# Patient Record
Sex: Female | Born: 1950 | Race: White | Hispanic: No | Smoking: Never smoker
Health system: Southern US, Community
[De-identification: ages and names within clinical notes are randomized; demographics above are authoritative.]

## PROBLEM LIST (undated history)

## (undated) DIAGNOSIS — M419 Scoliosis, unspecified: Secondary | ICD-10-CM

## (undated) DIAGNOSIS — F329 Major depressive disorder, single episode, unspecified: Secondary | ICD-10-CM

## (undated) DIAGNOSIS — M8580 Other specified disorders of bone density and structure, unspecified site: Secondary | ICD-10-CM

## (undated) DIAGNOSIS — F32A Depression, unspecified: Secondary | ICD-10-CM

## (undated) DIAGNOSIS — I10 Essential (primary) hypertension: Secondary | ICD-10-CM

## (undated) DIAGNOSIS — G250 Essential tremor: Secondary | ICD-10-CM

## (undated) DIAGNOSIS — E785 Hyperlipidemia, unspecified: Secondary | ICD-10-CM

## (undated) DIAGNOSIS — N189 Chronic kidney disease, unspecified: Secondary | ICD-10-CM

## (undated) DIAGNOSIS — K589 Irritable bowel syndrome without diarrhea: Secondary | ICD-10-CM

## (undated) DIAGNOSIS — M1990 Unspecified osteoarthritis, unspecified site: Secondary | ICD-10-CM

## (undated) DIAGNOSIS — D649 Anemia, unspecified: Secondary | ICD-10-CM

## (undated) DIAGNOSIS — M069 Rheumatoid arthritis, unspecified: Secondary | ICD-10-CM

## (undated) DIAGNOSIS — M797 Fibromyalgia: Secondary | ICD-10-CM

## (undated) DIAGNOSIS — C8190 Hodgkin lymphoma, unspecified, unspecified site: Secondary | ICD-10-CM

## (undated) DIAGNOSIS — G629 Polyneuropathy, unspecified: Secondary | ICD-10-CM

## (undated) DIAGNOSIS — Z8719 Personal history of other diseases of the digestive system: Secondary | ICD-10-CM

## (undated) DIAGNOSIS — I639 Cerebral infarction, unspecified: Secondary | ICD-10-CM

## (undated) DIAGNOSIS — E119 Type 2 diabetes mellitus without complications: Secondary | ICD-10-CM

## (undated) DIAGNOSIS — F319 Bipolar disorder, unspecified: Secondary | ICD-10-CM

## (undated) DIAGNOSIS — M316 Other giant cell arteritis: Secondary | ICD-10-CM

## (undated) DIAGNOSIS — K219 Gastro-esophageal reflux disease without esophagitis: Secondary | ICD-10-CM

## (undated) DIAGNOSIS — F419 Anxiety disorder, unspecified: Secondary | ICD-10-CM

## (undated) DIAGNOSIS — R32 Unspecified urinary incontinence: Secondary | ICD-10-CM

## (undated) DIAGNOSIS — G4730 Sleep apnea, unspecified: Secondary | ICD-10-CM

## (undated) HISTORY — DX: Unspecified osteoarthritis, unspecified site: M19.90

## (undated) HISTORY — DX: Gastro-esophageal reflux disease without esophagitis: K21.9

## (undated) HISTORY — PX: ELBOW ARTHROSCOPY: SUR87

## (undated) HISTORY — DX: Fibromyalgia: M79.7

## (undated) HISTORY — DX: Rheumatoid arthritis, unspecified: M06.9

## (undated) HISTORY — DX: Anxiety disorder, unspecified: F41.9

## (undated) HISTORY — DX: Depression, unspecified: F32.A

## (undated) HISTORY — DX: Unspecified urinary incontinence: R32

## (undated) HISTORY — DX: Hyperlipidemia, unspecified: E78.5

## (undated) HISTORY — DX: Irritable bowel syndrome, unspecified: K58.9

## (undated) HISTORY — PX: HAND SURGERY: SHX662

## (undated) HISTORY — PX: BACK SURGERY: SHX140

## (undated) HISTORY — DX: Type 2 diabetes mellitus without complications: E11.9

## (undated) HISTORY — PX: FINGER SURGERY: SHX640

## (undated) HISTORY — PX: HERNIA REPAIR: SHX51

## (undated) HISTORY — PX: SPINE SURGERY: SHX786

---
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
## 1999-12-31 ENCOUNTER — Encounter: Payer: Self-pay | Admitting: Emergency Medicine

## 1999-12-31 ENCOUNTER — Emergency Department (HOSPITAL_COMMUNITY): Admission: EM | Admit: 1999-12-31 | Discharge: 1999-12-31 | Payer: Self-pay | Admitting: Emergency Medicine

## 2002-09-10 ENCOUNTER — Ambulatory Visit (HOSPITAL_COMMUNITY): Admission: RE | Admit: 2002-09-10 | Discharge: 2002-09-10 | Payer: Self-pay | Admitting: Internal Medicine

## 2004-03-16 ENCOUNTER — Ambulatory Visit (HOSPITAL_COMMUNITY): Admission: RE | Admit: 2004-03-16 | Discharge: 2004-03-16 | Payer: Self-pay | Admitting: Internal Medicine

## 2004-04-25 ENCOUNTER — Ambulatory Visit (HOSPITAL_COMMUNITY): Admission: RE | Admit: 2004-04-25 | Discharge: 2004-04-25 | Payer: Self-pay | Admitting: Gastroenterology

## 2004-07-29 DIAGNOSIS — F319 Bipolar disorder, unspecified: Secondary | ICD-10-CM

## 2004-07-29 HISTORY — DX: Bipolar disorder, unspecified: F31.9

## 2007-01-12 DIAGNOSIS — K449 Diaphragmatic hernia without obstruction or gangrene: Secondary | ICD-10-CM | POA: Insufficient documentation

## 2011-04-03 DIAGNOSIS — K219 Gastro-esophageal reflux disease without esophagitis: Secondary | ICD-10-CM | POA: Insufficient documentation

## 2012-07-29 DIAGNOSIS — C8190 Hodgkin lymphoma, unspecified, unspecified site: Secondary | ICD-10-CM

## 2012-07-29 DIAGNOSIS — C8110 Nodular sclerosis classical Hodgkin lymphoma, unspecified site: Secondary | ICD-10-CM | POA: Insufficient documentation

## 2012-07-29 HISTORY — DX: Hodgkin lymphoma, unspecified, unspecified site: C81.90

## 2014-09-19 DIAGNOSIS — K589 Irritable bowel syndrome without diarrhea: Secondary | ICD-10-CM | POA: Insufficient documentation

## 2016-03-21 DIAGNOSIS — M797 Fibromyalgia: Secondary | ICD-10-CM | POA: Insufficient documentation

## 2016-07-09 DIAGNOSIS — F419 Anxiety disorder, unspecified: Secondary | ICD-10-CM | POA: Diagnosis not present

## 2016-07-09 DIAGNOSIS — M797 Fibromyalgia: Secondary | ICD-10-CM | POA: Diagnosis not present

## 2016-07-09 DIAGNOSIS — Z8719 Personal history of other diseases of the digestive system: Secondary | ICD-10-CM | POA: Diagnosis not present

## 2016-07-09 DIAGNOSIS — G3184 Mild cognitive impairment, so stated: Secondary | ICD-10-CM | POA: Diagnosis not present

## 2016-07-09 DIAGNOSIS — R251 Tremor, unspecified: Secondary | ICD-10-CM | POA: Diagnosis not present

## 2016-07-09 DIAGNOSIS — I10 Essential (primary) hypertension: Secondary | ICD-10-CM | POA: Diagnosis not present

## 2016-07-09 DIAGNOSIS — R269 Unspecified abnormalities of gait and mobility: Secondary | ICD-10-CM | POA: Diagnosis not present

## 2016-07-09 DIAGNOSIS — Z85828 Personal history of other malignant neoplasm of skin: Secondary | ICD-10-CM | POA: Diagnosis not present

## 2016-07-09 DIAGNOSIS — E785 Hyperlipidemia, unspecified: Secondary | ICD-10-CM | POA: Diagnosis not present

## 2016-07-09 DIAGNOSIS — E1142 Type 2 diabetes mellitus with diabetic polyneuropathy: Secondary | ICD-10-CM | POA: Diagnosis not present

## 2016-07-09 DIAGNOSIS — E871 Hypo-osmolality and hyponatremia: Secondary | ICD-10-CM | POA: Diagnosis not present

## 2016-07-09 DIAGNOSIS — N3281 Overactive bladder: Secondary | ICD-10-CM | POA: Diagnosis not present

## 2016-07-09 DIAGNOSIS — Z794 Long term (current) use of insulin: Secondary | ICD-10-CM | POA: Diagnosis not present

## 2016-07-09 DIAGNOSIS — C8190 Hodgkin lymphoma, unspecified, unspecified site: Secondary | ICD-10-CM | POA: Diagnosis not present

## 2016-07-09 DIAGNOSIS — M48061 Spinal stenosis, lumbar region without neurogenic claudication: Secondary | ICD-10-CM | POA: Diagnosis not present

## 2016-07-09 DIAGNOSIS — Z7901 Long term (current) use of anticoagulants: Secondary | ICD-10-CM | POA: Diagnosis not present

## 2016-07-09 DIAGNOSIS — G4733 Obstructive sleep apnea (adult) (pediatric): Secondary | ICD-10-CM | POA: Diagnosis not present

## 2016-07-09 DIAGNOSIS — K589 Irritable bowel syndrome without diarrhea: Secondary | ICD-10-CM | POA: Diagnosis not present

## 2016-07-09 DIAGNOSIS — F410 Panic disorder [episodic paroxysmal anxiety] without agoraphobia: Secondary | ICD-10-CM | POA: Diagnosis not present

## 2016-07-09 DIAGNOSIS — F319 Bipolar disorder, unspecified: Secondary | ICD-10-CM | POA: Diagnosis not present

## 2016-07-09 DIAGNOSIS — R296 Repeated falls: Secondary | ICD-10-CM | POA: Diagnosis not present

## 2016-07-09 DIAGNOSIS — D649 Anemia, unspecified: Secondary | ICD-10-CM | POA: Diagnosis not present

## 2016-07-09 DIAGNOSIS — Z87440 Personal history of urinary (tract) infections: Secondary | ICD-10-CM | POA: Diagnosis not present

## 2016-07-09 DIAGNOSIS — Z7982 Long term (current) use of aspirin: Secondary | ICD-10-CM | POA: Diagnosis not present

## 2016-07-09 DIAGNOSIS — K219 Gastro-esophageal reflux disease without esophagitis: Secondary | ICD-10-CM | POA: Diagnosis not present

## 2016-07-11 DIAGNOSIS — I10 Essential (primary) hypertension: Secondary | ICD-10-CM | POA: Diagnosis not present

## 2016-07-11 DIAGNOSIS — C8190 Hodgkin lymphoma, unspecified, unspecified site: Secondary | ICD-10-CM | POA: Diagnosis not present

## 2016-07-11 DIAGNOSIS — E1142 Type 2 diabetes mellitus with diabetic polyneuropathy: Secondary | ICD-10-CM | POA: Diagnosis not present

## 2016-07-11 DIAGNOSIS — F319 Bipolar disorder, unspecified: Secondary | ICD-10-CM | POA: Diagnosis not present

## 2016-07-11 DIAGNOSIS — K589 Irritable bowel syndrome without diarrhea: Secondary | ICD-10-CM | POA: Diagnosis not present

## 2016-07-11 DIAGNOSIS — G3184 Mild cognitive impairment, so stated: Secondary | ICD-10-CM | POA: Diagnosis not present

## 2016-07-15 DIAGNOSIS — I10 Essential (primary) hypertension: Secondary | ICD-10-CM | POA: Diagnosis not present

## 2016-07-15 DIAGNOSIS — F319 Bipolar disorder, unspecified: Secondary | ICD-10-CM | POA: Diagnosis not present

## 2016-07-15 DIAGNOSIS — C8190 Hodgkin lymphoma, unspecified, unspecified site: Secondary | ICD-10-CM | POA: Diagnosis not present

## 2016-07-15 DIAGNOSIS — K589 Irritable bowel syndrome without diarrhea: Secondary | ICD-10-CM | POA: Diagnosis not present

## 2016-07-15 DIAGNOSIS — G3184 Mild cognitive impairment, so stated: Secondary | ICD-10-CM | POA: Diagnosis not present

## 2016-07-15 DIAGNOSIS — E1142 Type 2 diabetes mellitus with diabetic polyneuropathy: Secondary | ICD-10-CM | POA: Diagnosis not present

## 2016-07-16 DIAGNOSIS — K589 Irritable bowel syndrome without diarrhea: Secondary | ICD-10-CM | POA: Diagnosis not present

## 2016-07-16 DIAGNOSIS — E1142 Type 2 diabetes mellitus with diabetic polyneuropathy: Secondary | ICD-10-CM | POA: Diagnosis not present

## 2016-07-16 DIAGNOSIS — G3184 Mild cognitive impairment, so stated: Secondary | ICD-10-CM | POA: Diagnosis not present

## 2016-07-16 DIAGNOSIS — I10 Essential (primary) hypertension: Secondary | ICD-10-CM | POA: Diagnosis not present

## 2016-07-16 DIAGNOSIS — F319 Bipolar disorder, unspecified: Secondary | ICD-10-CM | POA: Diagnosis not present

## 2016-07-16 DIAGNOSIS — C8190 Hodgkin lymphoma, unspecified, unspecified site: Secondary | ICD-10-CM | POA: Diagnosis not present

## 2016-07-17 DIAGNOSIS — E1142 Type 2 diabetes mellitus with diabetic polyneuropathy: Secondary | ICD-10-CM | POA: Diagnosis not present

## 2016-07-17 DIAGNOSIS — C8190 Hodgkin lymphoma, unspecified, unspecified site: Secondary | ICD-10-CM | POA: Diagnosis not present

## 2016-07-17 DIAGNOSIS — F319 Bipolar disorder, unspecified: Secondary | ICD-10-CM | POA: Diagnosis not present

## 2016-07-17 DIAGNOSIS — G3184 Mild cognitive impairment, so stated: Secondary | ICD-10-CM | POA: Diagnosis not present

## 2016-07-17 DIAGNOSIS — I10 Essential (primary) hypertension: Secondary | ICD-10-CM | POA: Diagnosis not present

## 2016-07-17 DIAGNOSIS — K589 Irritable bowel syndrome without diarrhea: Secondary | ICD-10-CM | POA: Diagnosis not present

## 2016-07-19 DIAGNOSIS — K589 Irritable bowel syndrome without diarrhea: Secondary | ICD-10-CM | POA: Diagnosis not present

## 2016-07-19 DIAGNOSIS — C8190 Hodgkin lymphoma, unspecified, unspecified site: Secondary | ICD-10-CM | POA: Diagnosis not present

## 2016-07-19 DIAGNOSIS — F319 Bipolar disorder, unspecified: Secondary | ICD-10-CM | POA: Diagnosis not present

## 2016-07-19 DIAGNOSIS — I10 Essential (primary) hypertension: Secondary | ICD-10-CM | POA: Diagnosis not present

## 2016-07-19 DIAGNOSIS — E1142 Type 2 diabetes mellitus with diabetic polyneuropathy: Secondary | ICD-10-CM | POA: Diagnosis not present

## 2016-07-19 DIAGNOSIS — G3184 Mild cognitive impairment, so stated: Secondary | ICD-10-CM | POA: Diagnosis not present

## 2016-07-24 DIAGNOSIS — I10 Essential (primary) hypertension: Secondary | ICD-10-CM | POA: Diagnosis not present

## 2016-07-24 DIAGNOSIS — E1142 Type 2 diabetes mellitus with diabetic polyneuropathy: Secondary | ICD-10-CM | POA: Diagnosis not present

## 2016-07-24 DIAGNOSIS — G3184 Mild cognitive impairment, so stated: Secondary | ICD-10-CM | POA: Diagnosis not present

## 2016-07-24 DIAGNOSIS — C8190 Hodgkin lymphoma, unspecified, unspecified site: Secondary | ICD-10-CM | POA: Diagnosis not present

## 2016-07-24 DIAGNOSIS — K589 Irritable bowel syndrome without diarrhea: Secondary | ICD-10-CM | POA: Diagnosis not present

## 2016-07-24 DIAGNOSIS — F319 Bipolar disorder, unspecified: Secondary | ICD-10-CM | POA: Diagnosis not present

## 2016-07-25 DIAGNOSIS — K589 Irritable bowel syndrome without diarrhea: Secondary | ICD-10-CM | POA: Diagnosis not present

## 2016-07-25 DIAGNOSIS — E1142 Type 2 diabetes mellitus with diabetic polyneuropathy: Secondary | ICD-10-CM | POA: Diagnosis not present

## 2016-07-25 DIAGNOSIS — C8190 Hodgkin lymphoma, unspecified, unspecified site: Secondary | ICD-10-CM | POA: Diagnosis not present

## 2016-07-25 DIAGNOSIS — I10 Essential (primary) hypertension: Secondary | ICD-10-CM | POA: Diagnosis not present

## 2016-07-25 DIAGNOSIS — F319 Bipolar disorder, unspecified: Secondary | ICD-10-CM | POA: Diagnosis not present

## 2016-07-25 DIAGNOSIS — G3184 Mild cognitive impairment, so stated: Secondary | ICD-10-CM | POA: Diagnosis not present

## 2016-07-31 DIAGNOSIS — F319 Bipolar disorder, unspecified: Secondary | ICD-10-CM | POA: Diagnosis not present

## 2016-07-31 DIAGNOSIS — K589 Irritable bowel syndrome without diarrhea: Secondary | ICD-10-CM | POA: Diagnosis not present

## 2016-07-31 DIAGNOSIS — G3184 Mild cognitive impairment, so stated: Secondary | ICD-10-CM | POA: Diagnosis not present

## 2016-07-31 DIAGNOSIS — I10 Essential (primary) hypertension: Secondary | ICD-10-CM | POA: Diagnosis not present

## 2016-07-31 DIAGNOSIS — E1142 Type 2 diabetes mellitus with diabetic polyneuropathy: Secondary | ICD-10-CM | POA: Diagnosis not present

## 2016-07-31 DIAGNOSIS — C8190 Hodgkin lymphoma, unspecified, unspecified site: Secondary | ICD-10-CM | POA: Diagnosis not present

## 2016-08-01 DIAGNOSIS — E1142 Type 2 diabetes mellitus with diabetic polyneuropathy: Secondary | ICD-10-CM | POA: Diagnosis not present

## 2016-08-01 DIAGNOSIS — C8190 Hodgkin lymphoma, unspecified, unspecified site: Secondary | ICD-10-CM | POA: Diagnosis not present

## 2016-08-01 DIAGNOSIS — F319 Bipolar disorder, unspecified: Secondary | ICD-10-CM | POA: Diagnosis not present

## 2016-08-01 DIAGNOSIS — I10 Essential (primary) hypertension: Secondary | ICD-10-CM | POA: Diagnosis not present

## 2016-08-01 DIAGNOSIS — G3184 Mild cognitive impairment, so stated: Secondary | ICD-10-CM | POA: Diagnosis not present

## 2016-08-01 DIAGNOSIS — K589 Irritable bowel syndrome without diarrhea: Secondary | ICD-10-CM | POA: Diagnosis not present

## 2016-08-05 DIAGNOSIS — G3184 Mild cognitive impairment, so stated: Secondary | ICD-10-CM | POA: Diagnosis not present

## 2016-08-05 DIAGNOSIS — C8190 Hodgkin lymphoma, unspecified, unspecified site: Secondary | ICD-10-CM | POA: Diagnosis not present

## 2016-08-05 DIAGNOSIS — I10 Essential (primary) hypertension: Secondary | ICD-10-CM | POA: Diagnosis not present

## 2016-08-05 DIAGNOSIS — E1142 Type 2 diabetes mellitus with diabetic polyneuropathy: Secondary | ICD-10-CM | POA: Diagnosis not present

## 2016-08-05 DIAGNOSIS — K589 Irritable bowel syndrome without diarrhea: Secondary | ICD-10-CM | POA: Diagnosis not present

## 2016-08-05 DIAGNOSIS — F319 Bipolar disorder, unspecified: Secondary | ICD-10-CM | POA: Diagnosis not present

## 2016-08-06 DIAGNOSIS — C8190 Hodgkin lymphoma, unspecified, unspecified site: Secondary | ICD-10-CM | POA: Diagnosis not present

## 2016-08-06 DIAGNOSIS — I10 Essential (primary) hypertension: Secondary | ICD-10-CM | POA: Diagnosis not present

## 2016-08-06 DIAGNOSIS — J019 Acute sinusitis, unspecified: Secondary | ICD-10-CM | POA: Diagnosis not present

## 2016-08-06 DIAGNOSIS — G894 Chronic pain syndrome: Secondary | ICD-10-CM | POA: Diagnosis not present

## 2016-08-07 DIAGNOSIS — D72819 Decreased white blood cell count, unspecified: Secondary | ICD-10-CM | POA: Diagnosis not present

## 2016-08-07 DIAGNOSIS — C8118 Nodular sclerosis classical Hodgkin lymphoma, lymph nodes of multiple sites: Secondary | ICD-10-CM | POA: Diagnosis not present

## 2016-08-07 DIAGNOSIS — D649 Anemia, unspecified: Secondary | ICD-10-CM | POA: Diagnosis not present

## 2016-08-19 DIAGNOSIS — I10 Essential (primary) hypertension: Secondary | ICD-10-CM | POA: Diagnosis not present

## 2016-08-19 DIAGNOSIS — E871 Hypo-osmolality and hyponatremia: Secondary | ICD-10-CM | POA: Diagnosis not present

## 2016-08-19 DIAGNOSIS — N329 Bladder disorder, unspecified: Secondary | ICD-10-CM | POA: Diagnosis not present

## 2016-08-19 DIAGNOSIS — N182 Chronic kidney disease, stage 2 (mild): Secondary | ICD-10-CM | POA: Diagnosis not present

## 2016-08-23 DIAGNOSIS — D509 Iron deficiency anemia, unspecified: Secondary | ICD-10-CM | POA: Diagnosis not present

## 2016-08-23 DIAGNOSIS — D510 Vitamin B12 deficiency anemia due to intrinsic factor deficiency: Secondary | ICD-10-CM | POA: Diagnosis not present

## 2016-08-29 DIAGNOSIS — L600 Ingrowing nail: Secondary | ICD-10-CM | POA: Diagnosis not present

## 2016-08-29 DIAGNOSIS — L03031 Cellulitis of right toe: Secondary | ICD-10-CM | POA: Diagnosis not present

## 2016-09-05 DIAGNOSIS — M13841 Other specified arthritis, right hand: Secondary | ICD-10-CM | POA: Diagnosis not present

## 2016-09-05 DIAGNOSIS — E1169 Type 2 diabetes mellitus with other specified complication: Secondary | ICD-10-CM | POA: Diagnosis not present

## 2016-09-05 DIAGNOSIS — G894 Chronic pain syndrome: Secondary | ICD-10-CM | POA: Diagnosis not present

## 2016-09-05 DIAGNOSIS — M545 Low back pain: Secondary | ICD-10-CM | POA: Diagnosis not present

## 2016-09-05 DIAGNOSIS — M797 Fibromyalgia: Secondary | ICD-10-CM | POA: Diagnosis not present

## 2016-09-05 DIAGNOSIS — I10 Essential (primary) hypertension: Secondary | ICD-10-CM | POA: Diagnosis not present

## 2016-09-10 DIAGNOSIS — F321 Major depressive disorder, single episode, moderate: Secondary | ICD-10-CM | POA: Diagnosis not present

## 2016-09-10 DIAGNOSIS — F410 Panic disorder [episodic paroxysmal anxiety] without agoraphobia: Secondary | ICD-10-CM | POA: Diagnosis not present

## 2016-09-16 DIAGNOSIS — R413 Other amnesia: Secondary | ICD-10-CM | POA: Diagnosis not present

## 2016-09-16 DIAGNOSIS — G9341 Metabolic encephalopathy: Secondary | ICD-10-CM | POA: Diagnosis not present

## 2016-09-19 DIAGNOSIS — R590 Localized enlarged lymph nodes: Secondary | ICD-10-CM | POA: Diagnosis not present

## 2016-09-19 DIAGNOSIS — Z789 Other specified health status: Secondary | ICD-10-CM | POA: Diagnosis not present

## 2016-09-19 DIAGNOSIS — J45909 Unspecified asthma, uncomplicated: Secondary | ICD-10-CM | POA: Diagnosis not present

## 2016-09-19 DIAGNOSIS — G4730 Sleep apnea, unspecified: Secondary | ICD-10-CM | POA: Diagnosis not present

## 2016-09-20 DIAGNOSIS — S62306A Unspecified fracture of fifth metacarpal bone, right hand, initial encounter for closed fracture: Secondary | ICD-10-CM | POA: Diagnosis not present

## 2016-09-20 DIAGNOSIS — M542 Cervicalgia: Secondary | ICD-10-CM | POA: Diagnosis not present

## 2016-09-20 DIAGNOSIS — R51 Headache: Secondary | ICD-10-CM | POA: Diagnosis not present

## 2016-09-20 DIAGNOSIS — S62609A Fracture of unspecified phalanx of unspecified finger, initial encounter for closed fracture: Secondary | ICD-10-CM | POA: Diagnosis not present

## 2016-09-20 DIAGNOSIS — S62304A Unspecified fracture of fourth metacarpal bone, right hand, initial encounter for closed fracture: Secondary | ICD-10-CM | POA: Diagnosis not present

## 2016-09-20 DIAGNOSIS — S90416A Abrasion, unspecified lesser toe(s), initial encounter: Secondary | ICD-10-CM | POA: Diagnosis not present

## 2016-09-20 DIAGNOSIS — S6291XA Unspecified fracture of right wrist and hand, initial encounter for closed fracture: Secondary | ICD-10-CM | POA: Diagnosis not present

## 2016-09-20 DIAGNOSIS — S0083XA Contusion of other part of head, initial encounter: Secondary | ICD-10-CM | POA: Diagnosis not present

## 2016-09-20 DIAGNOSIS — S8992XA Unspecified injury of left lower leg, initial encounter: Secondary | ICD-10-CM | POA: Diagnosis not present

## 2016-09-20 DIAGNOSIS — S62616A Displaced fracture of proximal phalanx of right little finger, initial encounter for closed fracture: Secondary | ICD-10-CM | POA: Diagnosis not present

## 2016-09-20 DIAGNOSIS — S0990XA Unspecified injury of head, initial encounter: Secondary | ICD-10-CM | POA: Diagnosis not present

## 2016-09-20 DIAGNOSIS — S8001XA Contusion of right knee, initial encounter: Secondary | ICD-10-CM | POA: Diagnosis not present

## 2016-09-20 DIAGNOSIS — S8991XA Unspecified injury of right lower leg, initial encounter: Secondary | ICD-10-CM | POA: Diagnosis not present

## 2016-09-20 DIAGNOSIS — S098XXA Other specified injuries of head, initial encounter: Secondary | ICD-10-CM | POA: Diagnosis not present

## 2016-09-20 DIAGNOSIS — S8002XA Contusion of left knee, initial encounter: Secondary | ICD-10-CM | POA: Diagnosis not present

## 2016-09-20 DIAGNOSIS — S0993XA Unspecified injury of face, initial encounter: Secondary | ICD-10-CM | POA: Diagnosis not present

## 2016-09-20 DIAGNOSIS — S199XXA Unspecified injury of neck, initial encounter: Secondary | ICD-10-CM | POA: Diagnosis not present

## 2016-09-21 DIAGNOSIS — M25541 Pain in joints of right hand: Secondary | ICD-10-CM | POA: Diagnosis not present

## 2016-09-21 DIAGNOSIS — S62394A Other fracture of fourth metacarpal bone, right hand, initial encounter for closed fracture: Secondary | ICD-10-CM | POA: Diagnosis not present

## 2016-09-21 DIAGNOSIS — S62396A Other fracture of fifth metacarpal bone, right hand, initial encounter for closed fracture: Secondary | ICD-10-CM | POA: Diagnosis not present

## 2016-09-24 DIAGNOSIS — I10 Essential (primary) hypertension: Secondary | ICD-10-CM | POA: Diagnosis not present

## 2016-09-24 DIAGNOSIS — N390 Urinary tract infection, site not specified: Secondary | ICD-10-CM | POA: Diagnosis not present

## 2016-09-24 DIAGNOSIS — E538 Deficiency of other specified B group vitamins: Secondary | ICD-10-CM | POA: Diagnosis not present

## 2016-09-24 DIAGNOSIS — B9689 Other specified bacterial agents as the cause of diseases classified elsewhere: Secondary | ICD-10-CM | POA: Diagnosis not present

## 2016-09-24 DIAGNOSIS — M797 Fibromyalgia: Secondary | ICD-10-CM | POA: Diagnosis not present

## 2016-10-16 DIAGNOSIS — M65321 Trigger finger, right index finger: Secondary | ICD-10-CM | POA: Diagnosis not present

## 2016-10-17 DIAGNOSIS — F321 Major depressive disorder, single episode, moderate: Secondary | ICD-10-CM | POA: Diagnosis not present

## 2016-10-17 DIAGNOSIS — F319 Bipolar disorder, unspecified: Secondary | ICD-10-CM | POA: Diagnosis not present

## 2016-10-21 DIAGNOSIS — N182 Chronic kidney disease, stage 2 (mild): Secondary | ICD-10-CM | POA: Diagnosis not present

## 2016-10-21 DIAGNOSIS — I10 Essential (primary) hypertension: Secondary | ICD-10-CM | POA: Diagnosis not present

## 2016-10-21 DIAGNOSIS — N329 Bladder disorder, unspecified: Secondary | ICD-10-CM | POA: Diagnosis not present

## 2016-10-21 DIAGNOSIS — E871 Hypo-osmolality and hyponatremia: Secondary | ICD-10-CM | POA: Diagnosis not present

## 2016-10-23 DIAGNOSIS — N3281 Overactive bladder: Secondary | ICD-10-CM | POA: Diagnosis not present

## 2016-10-23 DIAGNOSIS — E1169 Type 2 diabetes mellitus with other specified complication: Secondary | ICD-10-CM | POA: Diagnosis not present

## 2016-10-23 DIAGNOSIS — G894 Chronic pain syndrome: Secondary | ICD-10-CM | POA: Diagnosis not present

## 2016-10-23 DIAGNOSIS — R5383 Other fatigue: Secondary | ICD-10-CM | POA: Diagnosis not present

## 2016-10-23 DIAGNOSIS — M797 Fibromyalgia: Secondary | ICD-10-CM | POA: Diagnosis not present

## 2016-10-23 DIAGNOSIS — E871 Hypo-osmolality and hyponatremia: Secondary | ICD-10-CM | POA: Diagnosis not present

## 2016-11-03 DIAGNOSIS — J040 Acute laryngitis: Secondary | ICD-10-CM | POA: Diagnosis not present

## 2016-11-03 DIAGNOSIS — J069 Acute upper respiratory infection, unspecified: Secondary | ICD-10-CM | POA: Diagnosis not present

## 2016-11-04 DIAGNOSIS — E538 Deficiency of other specified B group vitamins: Secondary | ICD-10-CM | POA: Diagnosis not present

## 2016-11-04 DIAGNOSIS — J208 Acute bronchitis due to other specified organisms: Secondary | ICD-10-CM | POA: Diagnosis not present

## 2016-11-04 DIAGNOSIS — G894 Chronic pain syndrome: Secondary | ICD-10-CM | POA: Diagnosis not present

## 2016-11-04 DIAGNOSIS — I10 Essential (primary) hypertension: Secondary | ICD-10-CM | POA: Diagnosis not present

## 2016-11-04 DIAGNOSIS — K219 Gastro-esophageal reflux disease without esophagitis: Secondary | ICD-10-CM | POA: Diagnosis not present

## 2016-11-07 DIAGNOSIS — Z87440 Personal history of urinary (tract) infections: Secondary | ICD-10-CM | POA: Diagnosis not present

## 2016-11-07 DIAGNOSIS — E784 Other hyperlipidemia: Secondary | ICD-10-CM | POA: Diagnosis not present

## 2016-11-07 DIAGNOSIS — M797 Fibromyalgia: Secondary | ICD-10-CM | POA: Diagnosis not present

## 2016-11-07 DIAGNOSIS — F419 Anxiety disorder, unspecified: Secondary | ICD-10-CM | POA: Diagnosis not present

## 2016-11-07 DIAGNOSIS — F319 Bipolar disorder, unspecified: Secondary | ICD-10-CM | POA: Diagnosis not present

## 2016-11-07 DIAGNOSIS — R1110 Vomiting, unspecified: Secondary | ICD-10-CM | POA: Diagnosis not present

## 2016-11-07 DIAGNOSIS — E1165 Type 2 diabetes mellitus with hyperglycemia: Secondary | ICD-10-CM | POA: Diagnosis present

## 2016-11-07 DIAGNOSIS — R809 Proteinuria, unspecified: Secondary | ICD-10-CM | POA: Diagnosis not present

## 2016-11-07 DIAGNOSIS — Z9181 History of falling: Secondary | ICD-10-CM | POA: Diagnosis not present

## 2016-11-07 DIAGNOSIS — Z8571 Personal history of Hodgkin lymphoma: Secondary | ICD-10-CM | POA: Diagnosis not present

## 2016-11-07 DIAGNOSIS — R112 Nausea with vomiting, unspecified: Secondary | ICD-10-CM | POA: Diagnosis present

## 2016-11-07 DIAGNOSIS — G9009 Other idiopathic peripheral autonomic neuropathy: Secondary | ICD-10-CM | POA: Diagnosis not present

## 2016-11-07 DIAGNOSIS — R296 Repeated falls: Secondary | ICD-10-CM | POA: Diagnosis present

## 2016-11-07 DIAGNOSIS — R110 Nausea: Secondary | ICD-10-CM | POA: Diagnosis not present

## 2016-11-07 DIAGNOSIS — Z8674 Personal history of sudden cardiac arrest: Secondary | ICD-10-CM | POA: Diagnosis not present

## 2016-11-07 DIAGNOSIS — E860 Dehydration: Secondary | ICD-10-CM | POA: Diagnosis not present

## 2016-11-07 DIAGNOSIS — E785 Hyperlipidemia, unspecified: Secondary | ICD-10-CM | POA: Diagnosis present

## 2016-11-07 DIAGNOSIS — N319 Neuromuscular dysfunction of bladder, unspecified: Secondary | ICD-10-CM | POA: Diagnosis not present

## 2016-11-07 DIAGNOSIS — J449 Chronic obstructive pulmonary disease, unspecified: Secondary | ICD-10-CM | POA: Diagnosis not present

## 2016-11-07 DIAGNOSIS — E1169 Type 2 diabetes mellitus with other specified complication: Secondary | ICD-10-CM | POA: Diagnosis not present

## 2016-11-07 DIAGNOSIS — Z794 Long term (current) use of insulin: Secondary | ICD-10-CM | POA: Diagnosis not present

## 2016-11-07 DIAGNOSIS — E1142 Type 2 diabetes mellitus with diabetic polyneuropathy: Secondary | ICD-10-CM | POA: Diagnosis present

## 2016-11-07 DIAGNOSIS — N3289 Other specified disorders of bladder: Secondary | ICD-10-CM | POA: Diagnosis not present

## 2016-11-07 DIAGNOSIS — R197 Diarrhea, unspecified: Secondary | ICD-10-CM | POA: Diagnosis not present

## 2016-11-07 DIAGNOSIS — E119 Type 2 diabetes mellitus without complications: Secondary | ICD-10-CM | POA: Diagnosis not present

## 2016-11-07 DIAGNOSIS — A419 Sepsis, unspecified organism: Secondary | ICD-10-CM | POA: Diagnosis not present

## 2016-11-07 DIAGNOSIS — R4182 Altered mental status, unspecified: Secondary | ICD-10-CM | POA: Diagnosis not present

## 2016-11-07 DIAGNOSIS — I10 Essential (primary) hypertension: Secondary | ICD-10-CM | POA: Diagnosis not present

## 2016-11-07 DIAGNOSIS — N390 Urinary tract infection, site not specified: Secondary | ICD-10-CM | POA: Diagnosis not present

## 2016-11-07 DIAGNOSIS — F4310 Post-traumatic stress disorder, unspecified: Secondary | ICD-10-CM | POA: Diagnosis not present

## 2016-11-07 DIAGNOSIS — C8118 Nodular sclerosis classical Hodgkin lymphoma, lymph nodes of multiple sites: Secondary | ICD-10-CM | POA: Diagnosis not present

## 2016-11-14 DIAGNOSIS — F4310 Post-traumatic stress disorder, unspecified: Secondary | ICD-10-CM | POA: Diagnosis not present

## 2016-11-14 DIAGNOSIS — F419 Anxiety disorder, unspecified: Secondary | ICD-10-CM | POA: Diagnosis not present

## 2016-11-14 DIAGNOSIS — F319 Bipolar disorder, unspecified: Secondary | ICD-10-CM | POA: Diagnosis not present

## 2016-11-18 DIAGNOSIS — S62396D Other fracture of fifth metacarpal bone, right hand, subsequent encounter for fracture with routine healing: Secondary | ICD-10-CM | POA: Diagnosis not present

## 2016-11-18 DIAGNOSIS — S62394D Other fracture of fourth metacarpal bone, right hand, subsequent encounter for fracture with routine healing: Secondary | ICD-10-CM | POA: Diagnosis not present

## 2016-11-20 DIAGNOSIS — G894 Chronic pain syndrome: Secondary | ICD-10-CM | POA: Diagnosis not present

## 2016-11-20 DIAGNOSIS — E1169 Type 2 diabetes mellitus with other specified complication: Secondary | ICD-10-CM | POA: Diagnosis not present

## 2016-11-20 DIAGNOSIS — K219 Gastro-esophageal reflux disease without esophagitis: Secondary | ICD-10-CM | POA: Diagnosis not present

## 2016-11-20 DIAGNOSIS — M799 Soft tissue disorder, unspecified: Secondary | ICD-10-CM | POA: Diagnosis not present

## 2016-11-20 DIAGNOSIS — R159 Full incontinence of feces: Secondary | ICD-10-CM | POA: Diagnosis not present

## 2016-11-21 DIAGNOSIS — F321 Major depressive disorder, single episode, moderate: Secondary | ICD-10-CM | POA: Diagnosis not present

## 2016-11-25 DIAGNOSIS — E871 Hypo-osmolality and hyponatremia: Secondary | ICD-10-CM | POA: Diagnosis not present

## 2016-11-25 DIAGNOSIS — I10 Essential (primary) hypertension: Secondary | ICD-10-CM | POA: Diagnosis not present

## 2016-11-25 DIAGNOSIS — N329 Bladder disorder, unspecified: Secondary | ICD-10-CM | POA: Diagnosis not present

## 2016-11-25 DIAGNOSIS — N182 Chronic kidney disease, stage 2 (mild): Secondary | ICD-10-CM | POA: Diagnosis not present

## 2016-12-10 DIAGNOSIS — R159 Full incontinence of feces: Secondary | ICD-10-CM | POA: Diagnosis not present

## 2016-12-10 DIAGNOSIS — E119 Type 2 diabetes mellitus without complications: Secondary | ICD-10-CM | POA: Diagnosis not present

## 2016-12-10 DIAGNOSIS — C8590 Non-Hodgkin lymphoma, unspecified, unspecified site: Secondary | ICD-10-CM | POA: Diagnosis not present

## 2016-12-10 DIAGNOSIS — E669 Obesity, unspecified: Secondary | ICD-10-CM | POA: Diagnosis not present

## 2016-12-14 DIAGNOSIS — R1110 Vomiting, unspecified: Secondary | ICD-10-CM | POA: Diagnosis not present

## 2016-12-14 DIAGNOSIS — R4182 Altered mental status, unspecified: Secondary | ICD-10-CM | POA: Diagnosis not present

## 2016-12-14 DIAGNOSIS — M797 Fibromyalgia: Secondary | ICD-10-CM | POA: Diagnosis not present

## 2016-12-14 DIAGNOSIS — C8118 Nodular sclerosis classical Hodgkin lymphoma, lymph nodes of multiple sites: Secondary | ICD-10-CM | POA: Diagnosis not present

## 2016-12-14 DIAGNOSIS — E860 Dehydration: Secondary | ICD-10-CM | POA: Diagnosis not present

## 2016-12-14 DIAGNOSIS — E1169 Type 2 diabetes mellitus with other specified complication: Secondary | ICD-10-CM | POA: Diagnosis not present

## 2016-12-14 DIAGNOSIS — J449 Chronic obstructive pulmonary disease, unspecified: Secondary | ICD-10-CM | POA: Diagnosis not present

## 2016-12-14 DIAGNOSIS — I10 Essential (primary) hypertension: Secondary | ICD-10-CM | POA: Diagnosis not present

## 2016-12-14 DIAGNOSIS — N3289 Other specified disorders of bladder: Secondary | ICD-10-CM | POA: Diagnosis not present

## 2016-12-14 DIAGNOSIS — R110 Nausea: Secondary | ICD-10-CM | POA: Diagnosis not present

## 2016-12-14 DIAGNOSIS — Z9181 History of falling: Secondary | ICD-10-CM | POA: Diagnosis not present

## 2016-12-14 DIAGNOSIS — E784 Other hyperlipidemia: Secondary | ICD-10-CM | POA: Diagnosis not present

## 2016-12-18 DIAGNOSIS — M65321 Trigger finger, right index finger: Secondary | ICD-10-CM | POA: Diagnosis not present

## 2016-12-18 DIAGNOSIS — S62394D Other fracture of fourth metacarpal bone, right hand, subsequent encounter for fracture with routine healing: Secondary | ICD-10-CM | POA: Diagnosis not present

## 2016-12-19 DIAGNOSIS — F319 Bipolar disorder, unspecified: Secondary | ICD-10-CM | POA: Diagnosis not present

## 2016-12-19 DIAGNOSIS — F419 Anxiety disorder, unspecified: Secondary | ICD-10-CM | POA: Diagnosis not present

## 2016-12-19 DIAGNOSIS — F4310 Post-traumatic stress disorder, unspecified: Secondary | ICD-10-CM | POA: Diagnosis not present

## 2016-12-20 DIAGNOSIS — M797 Fibromyalgia: Secondary | ICD-10-CM | POA: Diagnosis not present

## 2016-12-20 DIAGNOSIS — C8190 Hodgkin lymphoma, unspecified, unspecified site: Secondary | ICD-10-CM | POA: Diagnosis not present

## 2016-12-20 DIAGNOSIS — L03115 Cellulitis of right lower limb: Secondary | ICD-10-CM | POA: Diagnosis not present

## 2016-12-20 DIAGNOSIS — G894 Chronic pain syndrome: Secondary | ICD-10-CM | POA: Diagnosis not present

## 2016-12-20 DIAGNOSIS — I10 Essential (primary) hypertension: Secondary | ICD-10-CM | POA: Diagnosis not present

## 2016-12-20 DIAGNOSIS — E538 Deficiency of other specified B group vitamins: Secondary | ICD-10-CM | POA: Diagnosis not present

## 2016-12-24 DIAGNOSIS — F321 Major depressive disorder, single episode, moderate: Secondary | ICD-10-CM | POA: Diagnosis not present

## 2016-12-27 DIAGNOSIS — G4733 Obstructive sleep apnea (adult) (pediatric): Secondary | ICD-10-CM | POA: Diagnosis not present

## 2016-12-27 DIAGNOSIS — E1169 Type 2 diabetes mellitus with other specified complication: Secondary | ICD-10-CM | POA: Diagnosis not present

## 2016-12-27 DIAGNOSIS — M797 Fibromyalgia: Secondary | ICD-10-CM | POA: Diagnosis not present

## 2016-12-27 DIAGNOSIS — E1142 Type 2 diabetes mellitus with diabetic polyneuropathy: Secondary | ICD-10-CM | POA: Diagnosis not present

## 2016-12-27 DIAGNOSIS — M545 Low back pain: Secondary | ICD-10-CM | POA: Diagnosis not present

## 2016-12-27 DIAGNOSIS — F319 Bipolar disorder, unspecified: Secondary | ICD-10-CM | POA: Diagnosis present

## 2016-12-27 DIAGNOSIS — Z9989 Dependence on other enabling machines and devices: Secondary | ICD-10-CM | POA: Diagnosis not present

## 2016-12-27 DIAGNOSIS — R42 Dizziness and giddiness: Secondary | ICD-10-CM | POA: Diagnosis not present

## 2016-12-27 DIAGNOSIS — Z9119 Patient's noncompliance with other medical treatment and regimen: Secondary | ICD-10-CM | POA: Diagnosis not present

## 2016-12-27 DIAGNOSIS — J45909 Unspecified asthma, uncomplicated: Secondary | ICD-10-CM | POA: Diagnosis not present

## 2016-12-27 DIAGNOSIS — K219 Gastro-esophageal reflux disease without esophagitis: Secondary | ICD-10-CM | POA: Diagnosis not present

## 2016-12-27 DIAGNOSIS — E559 Vitamin D deficiency, unspecified: Secondary | ICD-10-CM | POA: Diagnosis present

## 2016-12-27 DIAGNOSIS — E785 Hyperlipidemia, unspecified: Secondary | ICD-10-CM | POA: Diagnosis not present

## 2016-12-27 DIAGNOSIS — I639 Cerebral infarction, unspecified: Secondary | ICD-10-CM | POA: Diagnosis not present

## 2016-12-27 DIAGNOSIS — I348 Other nonrheumatic mitral valve disorders: Secondary | ICD-10-CM | POA: Diagnosis not present

## 2016-12-27 DIAGNOSIS — D649 Anemia, unspecified: Secondary | ICD-10-CM | POA: Diagnosis present

## 2016-12-27 DIAGNOSIS — K149 Disease of tongue, unspecified: Secondary | ICD-10-CM | POA: Diagnosis not present

## 2016-12-27 DIAGNOSIS — I517 Cardiomegaly: Secondary | ICD-10-CM | POA: Diagnosis not present

## 2016-12-27 DIAGNOSIS — Z9889 Other specified postprocedural states: Secondary | ICD-10-CM | POA: Diagnosis not present

## 2016-12-27 DIAGNOSIS — I63531 Cerebral infarction due to unspecified occlusion or stenosis of right posterior cerebral artery: Secondary | ICD-10-CM | POA: Diagnosis not present

## 2016-12-27 DIAGNOSIS — R809 Proteinuria, unspecified: Secondary | ICD-10-CM | POA: Diagnosis not present

## 2016-12-27 DIAGNOSIS — R51 Headache: Secondary | ICD-10-CM | POA: Diagnosis not present

## 2016-12-27 DIAGNOSIS — D631 Anemia in chronic kidney disease: Secondary | ICD-10-CM | POA: Diagnosis not present

## 2016-12-27 DIAGNOSIS — R29810 Facial weakness: Secondary | ICD-10-CM | POA: Diagnosis not present

## 2016-12-27 DIAGNOSIS — E784 Other hyperlipidemia: Secondary | ICD-10-CM | POA: Diagnosis not present

## 2016-12-27 DIAGNOSIS — Z9282 Status post administration of tPA (rtPA) in a different facility within the last 24 hours prior to admission to current facility: Secondary | ICD-10-CM | POA: Diagnosis not present

## 2016-12-27 DIAGNOSIS — D6489 Other specified anemias: Secondary | ICD-10-CM | POA: Diagnosis not present

## 2016-12-27 DIAGNOSIS — R897 Abnormal histological findings in specimens from other organs, systems and tissues: Secondary | ICD-10-CM | POA: Diagnosis not present

## 2016-12-27 DIAGNOSIS — C8190 Hodgkin lymphoma, unspecified, unspecified site: Secondary | ICD-10-CM | POA: Diagnosis not present

## 2016-12-27 DIAGNOSIS — Z794 Long term (current) use of insulin: Secondary | ICD-10-CM | POA: Diagnosis not present

## 2016-12-27 DIAGNOSIS — G9009 Other idiopathic peripheral autonomic neuropathy: Secondary | ICD-10-CM | POA: Diagnosis not present

## 2016-12-27 DIAGNOSIS — R800 Isolated proteinuria: Secondary | ICD-10-CM | POA: Diagnosis not present

## 2016-12-27 DIAGNOSIS — I119 Hypertensive heart disease without heart failure: Secondary | ICD-10-CM | POA: Diagnosis present

## 2016-12-27 DIAGNOSIS — E2609 Other primary hyperaldosteronism: Secondary | ICD-10-CM | POA: Diagnosis present

## 2016-12-27 DIAGNOSIS — E119 Type 2 diabetes mellitus without complications: Secondary | ICD-10-CM | POA: Diagnosis not present

## 2016-12-27 DIAGNOSIS — E871 Hypo-osmolality and hyponatremia: Secondary | ICD-10-CM | POA: Diagnosis not present

## 2016-12-27 DIAGNOSIS — I10 Essential (primary) hypertension: Secondary | ICD-10-CM | POA: Diagnosis not present

## 2016-12-27 DIAGNOSIS — E1165 Type 2 diabetes mellitus with hyperglycemia: Secondary | ICD-10-CM | POA: Diagnosis not present

## 2016-12-27 DIAGNOSIS — I160 Hypertensive urgency: Secondary | ICD-10-CM | POA: Diagnosis not present

## 2016-12-27 DIAGNOSIS — C01 Malignant neoplasm of base of tongue: Secondary | ICD-10-CM | POA: Diagnosis not present

## 2016-12-27 DIAGNOSIS — C8590 Non-Hodgkin lymphoma, unspecified, unspecified site: Secondary | ICD-10-CM | POA: Diagnosis not present

## 2016-12-27 DIAGNOSIS — J4520 Mild intermittent asthma, uncomplicated: Secondary | ICD-10-CM | POA: Diagnosis not present

## 2016-12-27 DIAGNOSIS — G510 Bell's palsy: Secondary | ICD-10-CM | POA: Diagnosis not present

## 2016-12-27 DIAGNOSIS — G8194 Hemiplegia, unspecified affecting left nondominant side: Secondary | ICD-10-CM | POA: Diagnosis not present

## 2016-12-27 DIAGNOSIS — D101 Benign neoplasm of tongue: Secondary | ICD-10-CM | POA: Diagnosis not present

## 2016-12-27 DIAGNOSIS — I368 Other nonrheumatic tricuspid valve disorders: Secondary | ICD-10-CM | POA: Diagnosis not present

## 2016-12-27 DIAGNOSIS — E7800 Pure hypercholesterolemia, unspecified: Secondary | ICD-10-CM | POA: Diagnosis not present

## 2016-12-27 DIAGNOSIS — K148 Other diseases of tongue: Secondary | ICD-10-CM | POA: Diagnosis not present

## 2016-12-27 DIAGNOSIS — R29818 Other symptoms and signs involving the nervous system: Secondary | ICD-10-CM | POA: Diagnosis not present

## 2017-01-22 DIAGNOSIS — I10 Essential (primary) hypertension: Secondary | ICD-10-CM | POA: Diagnosis not present

## 2017-01-22 DIAGNOSIS — Z794 Long term (current) use of insulin: Secondary | ICD-10-CM | POA: Diagnosis not present

## 2017-01-22 DIAGNOSIS — F319 Bipolar disorder, unspecified: Secondary | ICD-10-CM | POA: Diagnosis not present

## 2017-01-22 DIAGNOSIS — I69354 Hemiplegia and hemiparesis following cerebral infarction affecting left non-dominant side: Secondary | ICD-10-CM | POA: Diagnosis not present

## 2017-01-22 DIAGNOSIS — Z87440 Personal history of urinary (tract) infections: Secondary | ICD-10-CM | POA: Diagnosis not present

## 2017-01-22 DIAGNOSIS — J45909 Unspecified asthma, uncomplicated: Secondary | ICD-10-CM | POA: Diagnosis not present

## 2017-01-22 DIAGNOSIS — Z9181 History of falling: Secondary | ICD-10-CM | POA: Diagnosis not present

## 2017-01-22 DIAGNOSIS — E1140 Type 2 diabetes mellitus with diabetic neuropathy, unspecified: Secondary | ICD-10-CM | POA: Diagnosis not present

## 2017-01-22 DIAGNOSIS — Z8571 Personal history of Hodgkin lymphoma: Secondary | ICD-10-CM | POA: Diagnosis not present

## 2017-01-22 DIAGNOSIS — M797 Fibromyalgia: Secondary | ICD-10-CM | POA: Diagnosis not present

## 2017-01-24 DIAGNOSIS — E785 Hyperlipidemia, unspecified: Secondary | ICD-10-CM | POA: Diagnosis not present

## 2017-01-24 DIAGNOSIS — S3991XA Unspecified injury of abdomen, initial encounter: Secondary | ICD-10-CM | POA: Diagnosis not present

## 2017-01-24 DIAGNOSIS — I10 Essential (primary) hypertension: Secondary | ICD-10-CM | POA: Diagnosis not present

## 2017-01-24 DIAGNOSIS — J45909 Unspecified asthma, uncomplicated: Secondary | ICD-10-CM | POA: Diagnosis not present

## 2017-01-24 DIAGNOSIS — R079 Chest pain, unspecified: Secondary | ICD-10-CM | POA: Diagnosis not present

## 2017-01-24 DIAGNOSIS — S299XXA Unspecified injury of thorax, initial encounter: Secondary | ICD-10-CM | POA: Diagnosis not present

## 2017-01-24 DIAGNOSIS — M797 Fibromyalgia: Secondary | ICD-10-CM | POA: Diagnosis not present

## 2017-01-24 DIAGNOSIS — I69354 Hemiplegia and hemiparesis following cerebral infarction affecting left non-dominant side: Secondary | ICD-10-CM | POA: Diagnosis not present

## 2017-01-24 DIAGNOSIS — S6991XA Unspecified injury of right wrist, hand and finger(s), initial encounter: Secondary | ICD-10-CM | POA: Diagnosis not present

## 2017-01-24 DIAGNOSIS — S300XXA Contusion of lower back and pelvis, initial encounter: Secondary | ICD-10-CM | POA: Diagnosis not present

## 2017-01-24 DIAGNOSIS — R0789 Other chest pain: Secondary | ICD-10-CM | POA: Diagnosis not present

## 2017-01-24 DIAGNOSIS — M7989 Other specified soft tissue disorders: Secondary | ICD-10-CM | POA: Diagnosis not present

## 2017-01-24 DIAGNOSIS — E119 Type 2 diabetes mellitus without complications: Secondary | ICD-10-CM | POA: Diagnosis not present

## 2017-01-24 DIAGNOSIS — E1140 Type 2 diabetes mellitus with diabetic neuropathy, unspecified: Secondary | ICD-10-CM | POA: Diagnosis not present

## 2017-01-24 DIAGNOSIS — Z8673 Personal history of transient ischemic attack (TIA), and cerebral infarction without residual deficits: Secondary | ICD-10-CM | POA: Diagnosis not present

## 2017-01-24 DIAGNOSIS — S6391XA Sprain of unspecified part of right wrist and hand, initial encounter: Secondary | ICD-10-CM | POA: Diagnosis not present

## 2017-01-24 DIAGNOSIS — F319 Bipolar disorder, unspecified: Secondary | ICD-10-CM | POA: Diagnosis not present

## 2017-01-24 DIAGNOSIS — R109 Unspecified abdominal pain: Secondary | ICD-10-CM | POA: Diagnosis not present

## 2017-01-27 DIAGNOSIS — J45909 Unspecified asthma, uncomplicated: Secondary | ICD-10-CM | POA: Diagnosis not present

## 2017-01-27 DIAGNOSIS — I10 Essential (primary) hypertension: Secondary | ICD-10-CM | POA: Diagnosis not present

## 2017-01-27 DIAGNOSIS — F319 Bipolar disorder, unspecified: Secondary | ICD-10-CM | POA: Diagnosis not present

## 2017-01-27 DIAGNOSIS — E1140 Type 2 diabetes mellitus with diabetic neuropathy, unspecified: Secondary | ICD-10-CM | POA: Diagnosis not present

## 2017-01-27 DIAGNOSIS — M797 Fibromyalgia: Secondary | ICD-10-CM | POA: Diagnosis not present

## 2017-01-27 DIAGNOSIS — I69354 Hemiplegia and hemiparesis following cerebral infarction affecting left non-dominant side: Secondary | ICD-10-CM | POA: Diagnosis not present

## 2017-01-28 DIAGNOSIS — J45909 Unspecified asthma, uncomplicated: Secondary | ICD-10-CM | POA: Diagnosis not present

## 2017-01-28 DIAGNOSIS — M797 Fibromyalgia: Secondary | ICD-10-CM | POA: Diagnosis not present

## 2017-01-28 DIAGNOSIS — I69354 Hemiplegia and hemiparesis following cerebral infarction affecting left non-dominant side: Secondary | ICD-10-CM | POA: Diagnosis not present

## 2017-01-28 DIAGNOSIS — I10 Essential (primary) hypertension: Secondary | ICD-10-CM | POA: Diagnosis not present

## 2017-01-28 DIAGNOSIS — F319 Bipolar disorder, unspecified: Secondary | ICD-10-CM | POA: Diagnosis not present

## 2017-01-28 DIAGNOSIS — E1140 Type 2 diabetes mellitus with diabetic neuropathy, unspecified: Secondary | ICD-10-CM | POA: Diagnosis not present

## 2017-01-30 DIAGNOSIS — I10 Essential (primary) hypertension: Secondary | ICD-10-CM | POA: Diagnosis not present

## 2017-01-30 DIAGNOSIS — N200 Calculus of kidney: Secondary | ICD-10-CM | POA: Diagnosis not present

## 2017-01-30 DIAGNOSIS — M797 Fibromyalgia: Secondary | ICD-10-CM | POA: Diagnosis not present

## 2017-01-30 DIAGNOSIS — N3281 Overactive bladder: Secondary | ICD-10-CM | POA: Diagnosis not present

## 2017-01-30 DIAGNOSIS — F319 Bipolar disorder, unspecified: Secondary | ICD-10-CM | POA: Diagnosis not present

## 2017-01-30 DIAGNOSIS — N390 Urinary tract infection, site not specified: Secondary | ICD-10-CM | POA: Diagnosis not present

## 2017-01-30 DIAGNOSIS — E1140 Type 2 diabetes mellitus with diabetic neuropathy, unspecified: Secondary | ICD-10-CM | POA: Diagnosis not present

## 2017-01-30 DIAGNOSIS — I69354 Hemiplegia and hemiparesis following cerebral infarction affecting left non-dominant side: Secondary | ICD-10-CM | POA: Diagnosis not present

## 2017-01-30 DIAGNOSIS — J45909 Unspecified asthma, uncomplicated: Secondary | ICD-10-CM | POA: Diagnosis not present

## 2017-01-31 DIAGNOSIS — M797 Fibromyalgia: Secondary | ICD-10-CM | POA: Diagnosis not present

## 2017-01-31 DIAGNOSIS — J45909 Unspecified asthma, uncomplicated: Secondary | ICD-10-CM | POA: Diagnosis not present

## 2017-01-31 DIAGNOSIS — F319 Bipolar disorder, unspecified: Secondary | ICD-10-CM | POA: Diagnosis not present

## 2017-01-31 DIAGNOSIS — E1140 Type 2 diabetes mellitus with diabetic neuropathy, unspecified: Secondary | ICD-10-CM | POA: Diagnosis not present

## 2017-01-31 DIAGNOSIS — F4310 Post-traumatic stress disorder, unspecified: Secondary | ICD-10-CM | POA: Diagnosis not present

## 2017-01-31 DIAGNOSIS — I10 Essential (primary) hypertension: Secondary | ICD-10-CM | POA: Diagnosis not present

## 2017-01-31 DIAGNOSIS — I69354 Hemiplegia and hemiparesis following cerebral infarction affecting left non-dominant side: Secondary | ICD-10-CM | POA: Diagnosis not present

## 2017-01-31 DIAGNOSIS — F419 Anxiety disorder, unspecified: Secondary | ICD-10-CM | POA: Diagnosis not present

## 2017-02-03 DIAGNOSIS — M79641 Pain in right hand: Secondary | ICD-10-CM | POA: Diagnosis not present

## 2017-02-03 DIAGNOSIS — E1169 Type 2 diabetes mellitus with other specified complication: Secondary | ICD-10-CM | POA: Diagnosis not present

## 2017-02-03 DIAGNOSIS — R42 Dizziness and giddiness: Secondary | ICD-10-CM | POA: Diagnosis not present

## 2017-02-03 DIAGNOSIS — E538 Deficiency of other specified B group vitamins: Secondary | ICD-10-CM | POA: Diagnosis not present

## 2017-02-03 DIAGNOSIS — F319 Bipolar disorder, unspecified: Secondary | ICD-10-CM | POA: Diagnosis not present

## 2017-02-04 DIAGNOSIS — F319 Bipolar disorder, unspecified: Secondary | ICD-10-CM | POA: Diagnosis not present

## 2017-02-04 DIAGNOSIS — J45909 Unspecified asthma, uncomplicated: Secondary | ICD-10-CM | POA: Diagnosis not present

## 2017-02-04 DIAGNOSIS — I69354 Hemiplegia and hemiparesis following cerebral infarction affecting left non-dominant side: Secondary | ICD-10-CM | POA: Diagnosis not present

## 2017-02-04 DIAGNOSIS — E1140 Type 2 diabetes mellitus with diabetic neuropathy, unspecified: Secondary | ICD-10-CM | POA: Diagnosis not present

## 2017-02-04 DIAGNOSIS — M797 Fibromyalgia: Secondary | ICD-10-CM | POA: Diagnosis not present

## 2017-02-04 DIAGNOSIS — I10 Essential (primary) hypertension: Secondary | ICD-10-CM | POA: Diagnosis not present

## 2017-02-06 DIAGNOSIS — E1140 Type 2 diabetes mellitus with diabetic neuropathy, unspecified: Secondary | ICD-10-CM | POA: Diagnosis not present

## 2017-02-06 DIAGNOSIS — I69354 Hemiplegia and hemiparesis following cerebral infarction affecting left non-dominant side: Secondary | ICD-10-CM | POA: Diagnosis not present

## 2017-02-06 DIAGNOSIS — D72819 Decreased white blood cell count, unspecified: Secondary | ICD-10-CM | POA: Diagnosis not present

## 2017-02-06 DIAGNOSIS — I10 Essential (primary) hypertension: Secondary | ICD-10-CM | POA: Diagnosis not present

## 2017-02-06 DIAGNOSIS — C4491 Basal cell carcinoma of skin, unspecified: Secondary | ICD-10-CM | POA: Diagnosis not present

## 2017-02-06 DIAGNOSIS — J45909 Unspecified asthma, uncomplicated: Secondary | ICD-10-CM | POA: Diagnosis not present

## 2017-02-06 DIAGNOSIS — M797 Fibromyalgia: Secondary | ICD-10-CM | POA: Diagnosis not present

## 2017-02-06 DIAGNOSIS — C8118 Nodular sclerosis classical Hodgkin lymphoma, lymph nodes of multiple sites: Secondary | ICD-10-CM | POA: Diagnosis not present

## 2017-02-06 DIAGNOSIS — F319 Bipolar disorder, unspecified: Secondary | ICD-10-CM | POA: Diagnosis not present

## 2017-02-07 DIAGNOSIS — I10 Essential (primary) hypertension: Secondary | ICD-10-CM | POA: Diagnosis not present

## 2017-02-07 DIAGNOSIS — F319 Bipolar disorder, unspecified: Secondary | ICD-10-CM | POA: Diagnosis not present

## 2017-02-07 DIAGNOSIS — J45909 Unspecified asthma, uncomplicated: Secondary | ICD-10-CM | POA: Diagnosis not present

## 2017-02-07 DIAGNOSIS — E1140 Type 2 diabetes mellitus with diabetic neuropathy, unspecified: Secondary | ICD-10-CM | POA: Diagnosis not present

## 2017-02-07 DIAGNOSIS — M797 Fibromyalgia: Secondary | ICD-10-CM | POA: Diagnosis not present

## 2017-02-07 DIAGNOSIS — I69354 Hemiplegia and hemiparesis following cerebral infarction affecting left non-dominant side: Secondary | ICD-10-CM | POA: Diagnosis not present

## 2017-02-09 DIAGNOSIS — M549 Dorsalgia, unspecified: Secondary | ICD-10-CM | POA: Diagnosis not present

## 2017-02-09 DIAGNOSIS — M546 Pain in thoracic spine: Secondary | ICD-10-CM | POA: Diagnosis not present

## 2017-02-09 DIAGNOSIS — R109 Unspecified abdominal pain: Secondary | ICD-10-CM | POA: Diagnosis not present

## 2017-02-09 DIAGNOSIS — E785 Hyperlipidemia, unspecified: Secondary | ICD-10-CM | POA: Diagnosis not present

## 2017-02-09 DIAGNOSIS — I10 Essential (primary) hypertension: Secondary | ICD-10-CM | POA: Diagnosis not present

## 2017-02-09 DIAGNOSIS — M797 Fibromyalgia: Secondary | ICD-10-CM | POA: Diagnosis not present

## 2017-02-09 DIAGNOSIS — R1032 Left lower quadrant pain: Secondary | ICD-10-CM | POA: Diagnosis not present

## 2017-02-09 DIAGNOSIS — R1031 Right lower quadrant pain: Secondary | ICD-10-CM | POA: Diagnosis not present

## 2017-02-11 DIAGNOSIS — M797 Fibromyalgia: Secondary | ICD-10-CM | POA: Diagnosis not present

## 2017-02-11 DIAGNOSIS — I10 Essential (primary) hypertension: Secondary | ICD-10-CM | POA: Diagnosis not present

## 2017-02-11 DIAGNOSIS — F319 Bipolar disorder, unspecified: Secondary | ICD-10-CM | POA: Diagnosis not present

## 2017-02-11 DIAGNOSIS — I69354 Hemiplegia and hemiparesis following cerebral infarction affecting left non-dominant side: Secondary | ICD-10-CM | POA: Diagnosis not present

## 2017-02-11 DIAGNOSIS — E1140 Type 2 diabetes mellitus with diabetic neuropathy, unspecified: Secondary | ICD-10-CM | POA: Diagnosis not present

## 2017-02-11 DIAGNOSIS — J45909 Unspecified asthma, uncomplicated: Secondary | ICD-10-CM | POA: Diagnosis not present

## 2017-02-13 DIAGNOSIS — M797 Fibromyalgia: Secondary | ICD-10-CM | POA: Diagnosis not present

## 2017-02-13 DIAGNOSIS — F319 Bipolar disorder, unspecified: Secondary | ICD-10-CM | POA: Diagnosis not present

## 2017-02-13 DIAGNOSIS — J45909 Unspecified asthma, uncomplicated: Secondary | ICD-10-CM | POA: Diagnosis not present

## 2017-02-13 DIAGNOSIS — E1140 Type 2 diabetes mellitus with diabetic neuropathy, unspecified: Secondary | ICD-10-CM | POA: Diagnosis not present

## 2017-02-13 DIAGNOSIS — E118 Type 2 diabetes mellitus with unspecified complications: Secondary | ICD-10-CM | POA: Diagnosis not present

## 2017-02-13 DIAGNOSIS — F411 Generalized anxiety disorder: Secondary | ICD-10-CM | POA: Diagnosis not present

## 2017-02-13 DIAGNOSIS — I69354 Hemiplegia and hemiparesis following cerebral infarction affecting left non-dominant side: Secondary | ICD-10-CM | POA: Diagnosis not present

## 2017-02-13 DIAGNOSIS — M25512 Pain in left shoulder: Secondary | ICD-10-CM | POA: Diagnosis not present

## 2017-02-13 DIAGNOSIS — I10 Essential (primary) hypertension: Secondary | ICD-10-CM | POA: Diagnosis not present

## 2017-02-14 DIAGNOSIS — I69354 Hemiplegia and hemiparesis following cerebral infarction affecting left non-dominant side: Secondary | ICD-10-CM | POA: Diagnosis not present

## 2017-02-14 DIAGNOSIS — M797 Fibromyalgia: Secondary | ICD-10-CM | POA: Diagnosis not present

## 2017-02-14 DIAGNOSIS — F319 Bipolar disorder, unspecified: Secondary | ICD-10-CM | POA: Diagnosis not present

## 2017-02-14 DIAGNOSIS — J45909 Unspecified asthma, uncomplicated: Secondary | ICD-10-CM | POA: Diagnosis not present

## 2017-02-14 DIAGNOSIS — I10 Essential (primary) hypertension: Secondary | ICD-10-CM | POA: Diagnosis not present

## 2017-02-14 DIAGNOSIS — E1140 Type 2 diabetes mellitus with diabetic neuropathy, unspecified: Secondary | ICD-10-CM | POA: Diagnosis not present

## 2017-02-15 DIAGNOSIS — G894 Chronic pain syndrome: Secondary | ICD-10-CM | POA: Diagnosis not present

## 2017-02-15 DIAGNOSIS — R110 Nausea: Secondary | ICD-10-CM | POA: Diagnosis not present

## 2017-02-15 DIAGNOSIS — E1169 Type 2 diabetes mellitus with other specified complication: Secondary | ICD-10-CM | POA: Diagnosis not present

## 2017-02-15 DIAGNOSIS — F329 Major depressive disorder, single episode, unspecified: Secondary | ICD-10-CM | POA: Diagnosis present

## 2017-02-15 DIAGNOSIS — I69092 Facial weakness following nontraumatic subarachnoid hemorrhage: Secondary | ICD-10-CM | POA: Diagnosis not present

## 2017-02-15 DIAGNOSIS — I639 Cerebral infarction, unspecified: Secondary | ICD-10-CM | POA: Diagnosis not present

## 2017-02-15 DIAGNOSIS — E784 Other hyperlipidemia: Secondary | ICD-10-CM | POA: Diagnosis not present

## 2017-02-15 DIAGNOSIS — I69354 Hemiplegia and hemiparesis following cerebral infarction affecting left non-dominant side: Secondary | ICD-10-CM | POA: Diagnosis not present

## 2017-02-15 DIAGNOSIS — I368 Other nonrheumatic tricuspid valve disorders: Secondary | ICD-10-CM | POA: Diagnosis not present

## 2017-02-15 DIAGNOSIS — G43109 Migraine with aura, not intractable, without status migrainosus: Secondary | ICD-10-CM | POA: Diagnosis not present

## 2017-02-15 DIAGNOSIS — N390 Urinary tract infection, site not specified: Secondary | ICD-10-CM | POA: Diagnosis not present

## 2017-02-15 DIAGNOSIS — J449 Chronic obstructive pulmonary disease, unspecified: Secondary | ICD-10-CM | POA: Diagnosis not present

## 2017-02-15 DIAGNOSIS — G510 Bell's palsy: Secondary | ICD-10-CM | POA: Diagnosis not present

## 2017-02-15 DIAGNOSIS — E785 Hyperlipidemia, unspecified: Secondary | ICD-10-CM | POA: Diagnosis not present

## 2017-02-15 DIAGNOSIS — R531 Weakness: Secondary | ICD-10-CM | POA: Diagnosis not present

## 2017-02-15 DIAGNOSIS — F419 Anxiety disorder, unspecified: Secondary | ICD-10-CM | POA: Diagnosis not present

## 2017-02-15 DIAGNOSIS — R4182 Altered mental status, unspecified: Secondary | ICD-10-CM | POA: Diagnosis not present

## 2017-02-15 DIAGNOSIS — F319 Bipolar disorder, unspecified: Secondary | ICD-10-CM | POA: Diagnosis not present

## 2017-02-15 DIAGNOSIS — M5030 Other cervical disc degeneration, unspecified cervical region: Secondary | ICD-10-CM | POA: Diagnosis not present

## 2017-02-15 DIAGNOSIS — K219 Gastro-esophageal reflux disease without esophagitis: Secondary | ICD-10-CM | POA: Diagnosis present

## 2017-02-15 DIAGNOSIS — R29810 Facial weakness: Secondary | ICD-10-CM | POA: Diagnosis not present

## 2017-02-15 DIAGNOSIS — E119 Type 2 diabetes mellitus without complications: Secondary | ICD-10-CM | POA: Diagnosis not present

## 2017-02-15 DIAGNOSIS — M797 Fibromyalgia: Secondary | ICD-10-CM | POA: Diagnosis not present

## 2017-02-15 DIAGNOSIS — M47812 Spondylosis without myelopathy or radiculopathy, cervical region: Secondary | ICD-10-CM | POA: Diagnosis not present

## 2017-02-15 DIAGNOSIS — R489 Unspecified symbolic dysfunctions: Secondary | ICD-10-CM | POA: Diagnosis not present

## 2017-02-15 DIAGNOSIS — F430 Acute stress reaction: Secondary | ICD-10-CM | POA: Diagnosis not present

## 2017-02-15 DIAGNOSIS — G459 Transient cerebral ischemic attack, unspecified: Secondary | ICD-10-CM | POA: Diagnosis not present

## 2017-02-15 DIAGNOSIS — B9689 Other specified bacterial agents as the cause of diseases classified elsewhere: Secondary | ICD-10-CM | POA: Diagnosis not present

## 2017-02-15 DIAGNOSIS — R29818 Other symptoms and signs involving the nervous system: Secondary | ICD-10-CM | POA: Diagnosis not present

## 2017-02-15 DIAGNOSIS — C8590 Non-Hodgkin lymphoma, unspecified, unspecified site: Secondary | ICD-10-CM | POA: Diagnosis not present

## 2017-02-15 DIAGNOSIS — I517 Cardiomegaly: Secondary | ICD-10-CM | POA: Diagnosis not present

## 2017-02-15 DIAGNOSIS — R29898 Other symptoms and signs involving the musculoskeletal system: Secondary | ICD-10-CM | POA: Diagnosis not present

## 2017-02-15 DIAGNOSIS — M6281 Muscle weakness (generalized): Secondary | ICD-10-CM | POA: Diagnosis not present

## 2017-02-15 DIAGNOSIS — I10 Essential (primary) hypertension: Secondary | ICD-10-CM | POA: Diagnosis not present

## 2017-02-15 DIAGNOSIS — R269 Unspecified abnormalities of gait and mobility: Secondary | ICD-10-CM | POA: Diagnosis not present

## 2017-02-15 DIAGNOSIS — R2681 Unsteadiness on feet: Secondary | ICD-10-CM | POA: Diagnosis not present

## 2017-02-15 DIAGNOSIS — R197 Diarrhea, unspecified: Secondary | ICD-10-CM | POA: Diagnosis not present

## 2017-02-19 DIAGNOSIS — F419 Anxiety disorder, unspecified: Secondary | ICD-10-CM | POA: Diagnosis not present

## 2017-02-19 DIAGNOSIS — B9689 Other specified bacterial agents as the cause of diseases classified elsewhere: Secondary | ICD-10-CM | POA: Diagnosis not present

## 2017-02-19 DIAGNOSIS — R489 Unspecified symbolic dysfunctions: Secondary | ICD-10-CM | POA: Diagnosis not present

## 2017-02-19 DIAGNOSIS — E1169 Type 2 diabetes mellitus with other specified complication: Secondary | ICD-10-CM | POA: Diagnosis not present

## 2017-02-19 DIAGNOSIS — I10 Essential (primary) hypertension: Secondary | ICD-10-CM | POA: Diagnosis not present

## 2017-02-19 DIAGNOSIS — G894 Chronic pain syndrome: Secondary | ICD-10-CM | POA: Diagnosis not present

## 2017-02-19 DIAGNOSIS — H532 Diplopia: Secondary | ICD-10-CM | POA: Diagnosis not present

## 2017-02-19 DIAGNOSIS — E119 Type 2 diabetes mellitus without complications: Secondary | ICD-10-CM | POA: Diagnosis not present

## 2017-02-19 DIAGNOSIS — I69351 Hemiplegia and hemiparesis following cerebral infarction affecting right dominant side: Secondary | ICD-10-CM | POA: Diagnosis not present

## 2017-02-19 DIAGNOSIS — G459 Transient cerebral ischemic attack, unspecified: Secondary | ICD-10-CM | POA: Diagnosis not present

## 2017-02-19 DIAGNOSIS — R29898 Other symptoms and signs involving the musculoskeletal system: Secondary | ICD-10-CM | POA: Diagnosis not present

## 2017-02-19 DIAGNOSIS — C8590 Non-Hodgkin lymphoma, unspecified, unspecified site: Secondary | ICD-10-CM | POA: Diagnosis not present

## 2017-02-19 DIAGNOSIS — E785 Hyperlipidemia, unspecified: Secondary | ICD-10-CM | POA: Diagnosis not present

## 2017-02-19 DIAGNOSIS — M797 Fibromyalgia: Secondary | ICD-10-CM | POA: Diagnosis not present

## 2017-02-19 DIAGNOSIS — R29810 Facial weakness: Secondary | ICD-10-CM | POA: Diagnosis not present

## 2017-02-19 DIAGNOSIS — E784 Other hyperlipidemia: Secondary | ICD-10-CM | POA: Diagnosis not present

## 2017-02-19 DIAGNOSIS — M6281 Muscle weakness (generalized): Secondary | ICD-10-CM | POA: Diagnosis not present

## 2017-02-19 DIAGNOSIS — J449 Chronic obstructive pulmonary disease, unspecified: Secondary | ICD-10-CM | POA: Diagnosis not present

## 2017-02-19 DIAGNOSIS — N390 Urinary tract infection, site not specified: Secondary | ICD-10-CM | POA: Diagnosis not present

## 2017-02-19 DIAGNOSIS — R197 Diarrhea, unspecified: Secondary | ICD-10-CM | POA: Diagnosis not present

## 2017-02-19 DIAGNOSIS — R2681 Unsteadiness on feet: Secondary | ICD-10-CM | POA: Diagnosis not present

## 2017-02-19 DIAGNOSIS — R110 Nausea: Secondary | ICD-10-CM | POA: Diagnosis not present

## 2017-02-19 DIAGNOSIS — Z8673 Personal history of transient ischemic attack (TIA), and cerebral infarction without residual deficits: Secondary | ICD-10-CM | POA: Diagnosis not present

## 2017-02-21 DIAGNOSIS — E119 Type 2 diabetes mellitus without complications: Secondary | ICD-10-CM | POA: Diagnosis not present

## 2017-02-21 DIAGNOSIS — R2681 Unsteadiness on feet: Secondary | ICD-10-CM | POA: Diagnosis not present

## 2017-02-21 DIAGNOSIS — H532 Diplopia: Secondary | ICD-10-CM | POA: Diagnosis not present

## 2017-02-21 DIAGNOSIS — I10 Essential (primary) hypertension: Secondary | ICD-10-CM | POA: Diagnosis not present

## 2017-02-21 DIAGNOSIS — Z8673 Personal history of transient ischemic attack (TIA), and cerebral infarction without residual deficits: Secondary | ICD-10-CM | POA: Diagnosis not present

## 2017-02-24 DIAGNOSIS — I10 Essential (primary) hypertension: Secondary | ICD-10-CM | POA: Diagnosis not present

## 2017-02-24 DIAGNOSIS — R2681 Unsteadiness on feet: Secondary | ICD-10-CM | POA: Diagnosis not present

## 2017-02-24 DIAGNOSIS — E119 Type 2 diabetes mellitus without complications: Secondary | ICD-10-CM | POA: Diagnosis not present

## 2017-02-24 DIAGNOSIS — F419 Anxiety disorder, unspecified: Secondary | ICD-10-CM | POA: Diagnosis not present

## 2017-02-25 DIAGNOSIS — H532 Diplopia: Secondary | ICD-10-CM | POA: Diagnosis not present

## 2017-02-25 DIAGNOSIS — G459 Transient cerebral ischemic attack, unspecified: Secondary | ICD-10-CM | POA: Diagnosis not present

## 2017-02-25 DIAGNOSIS — R2681 Unsteadiness on feet: Secondary | ICD-10-CM | POA: Diagnosis not present

## 2017-02-25 DIAGNOSIS — I10 Essential (primary) hypertension: Secondary | ICD-10-CM | POA: Diagnosis not present

## 2017-02-25 DIAGNOSIS — I69351 Hemiplegia and hemiparesis following cerebral infarction affecting right dominant side: Secondary | ICD-10-CM | POA: Diagnosis not present

## 2017-02-26 DIAGNOSIS — E1140 Type 2 diabetes mellitus with diabetic neuropathy, unspecified: Secondary | ICD-10-CM | POA: Diagnosis not present

## 2017-02-26 DIAGNOSIS — I69354 Hemiplegia and hemiparesis following cerebral infarction affecting left non-dominant side: Secondary | ICD-10-CM | POA: Diagnosis not present

## 2017-02-26 DIAGNOSIS — M797 Fibromyalgia: Secondary | ICD-10-CM | POA: Diagnosis not present

## 2017-02-26 DIAGNOSIS — J45909 Unspecified asthma, uncomplicated: Secondary | ICD-10-CM | POA: Diagnosis not present

## 2017-02-26 DIAGNOSIS — I10 Essential (primary) hypertension: Secondary | ICD-10-CM | POA: Diagnosis not present

## 2017-02-26 DIAGNOSIS — F319 Bipolar disorder, unspecified: Secondary | ICD-10-CM | POA: Diagnosis not present

## 2017-02-28 DIAGNOSIS — M797 Fibromyalgia: Secondary | ICD-10-CM | POA: Diagnosis not present

## 2017-02-28 DIAGNOSIS — I69354 Hemiplegia and hemiparesis following cerebral infarction affecting left non-dominant side: Secondary | ICD-10-CM | POA: Diagnosis not present

## 2017-02-28 DIAGNOSIS — J45909 Unspecified asthma, uncomplicated: Secondary | ICD-10-CM | POA: Diagnosis not present

## 2017-02-28 DIAGNOSIS — I10 Essential (primary) hypertension: Secondary | ICD-10-CM | POA: Diagnosis not present

## 2017-02-28 DIAGNOSIS — E1140 Type 2 diabetes mellitus with diabetic neuropathy, unspecified: Secondary | ICD-10-CM | POA: Diagnosis not present

## 2017-02-28 DIAGNOSIS — F319 Bipolar disorder, unspecified: Secondary | ICD-10-CM | POA: Diagnosis not present

## 2017-03-03 DIAGNOSIS — D631 Anemia in chronic kidney disease: Secondary | ICD-10-CM | POA: Diagnosis not present

## 2017-03-03 DIAGNOSIS — R809 Proteinuria, unspecified: Secondary | ICD-10-CM | POA: Diagnosis not present

## 2017-03-03 DIAGNOSIS — F319 Bipolar disorder, unspecified: Secondary | ICD-10-CM | POA: Diagnosis not present

## 2017-03-03 DIAGNOSIS — I10 Essential (primary) hypertension: Secondary | ICD-10-CM | POA: Diagnosis not present

## 2017-03-03 DIAGNOSIS — M797 Fibromyalgia: Secondary | ICD-10-CM | POA: Diagnosis not present

## 2017-03-03 DIAGNOSIS — J45909 Unspecified asthma, uncomplicated: Secondary | ICD-10-CM | POA: Diagnosis not present

## 2017-03-03 DIAGNOSIS — N182 Chronic kidney disease, stage 2 (mild): Secondary | ICD-10-CM | POA: Diagnosis not present

## 2017-03-03 DIAGNOSIS — E1140 Type 2 diabetes mellitus with diabetic neuropathy, unspecified: Secondary | ICD-10-CM | POA: Diagnosis not present

## 2017-03-03 DIAGNOSIS — E119 Type 2 diabetes mellitus without complications: Secondary | ICD-10-CM | POA: Diagnosis not present

## 2017-03-03 DIAGNOSIS — I69354 Hemiplegia and hemiparesis following cerebral infarction affecting left non-dominant side: Secondary | ICD-10-CM | POA: Diagnosis not present

## 2017-03-04 DIAGNOSIS — J45909 Unspecified asthma, uncomplicated: Secondary | ICD-10-CM | POA: Diagnosis not present

## 2017-03-04 DIAGNOSIS — I10 Essential (primary) hypertension: Secondary | ICD-10-CM | POA: Diagnosis not present

## 2017-03-04 DIAGNOSIS — E1140 Type 2 diabetes mellitus with diabetic neuropathy, unspecified: Secondary | ICD-10-CM | POA: Diagnosis not present

## 2017-03-04 DIAGNOSIS — I69354 Hemiplegia and hemiparesis following cerebral infarction affecting left non-dominant side: Secondary | ICD-10-CM | POA: Diagnosis not present

## 2017-03-04 DIAGNOSIS — F319 Bipolar disorder, unspecified: Secondary | ICD-10-CM | POA: Diagnosis not present

## 2017-03-04 DIAGNOSIS — M797 Fibromyalgia: Secondary | ICD-10-CM | POA: Diagnosis not present

## 2017-03-05 DIAGNOSIS — F319 Bipolar disorder, unspecified: Secondary | ICD-10-CM | POA: Diagnosis not present

## 2017-03-05 DIAGNOSIS — M797 Fibromyalgia: Secondary | ICD-10-CM | POA: Diagnosis not present

## 2017-03-05 DIAGNOSIS — J45909 Unspecified asthma, uncomplicated: Secondary | ICD-10-CM | POA: Diagnosis not present

## 2017-03-05 DIAGNOSIS — I69354 Hemiplegia and hemiparesis following cerebral infarction affecting left non-dominant side: Secondary | ICD-10-CM | POA: Diagnosis not present

## 2017-03-05 DIAGNOSIS — I10 Essential (primary) hypertension: Secondary | ICD-10-CM | POA: Diagnosis not present

## 2017-03-05 DIAGNOSIS — E1140 Type 2 diabetes mellitus with diabetic neuropathy, unspecified: Secondary | ICD-10-CM | POA: Diagnosis not present

## 2017-03-07 DIAGNOSIS — J45909 Unspecified asthma, uncomplicated: Secondary | ICD-10-CM | POA: Diagnosis not present

## 2017-03-07 DIAGNOSIS — E1140 Type 2 diabetes mellitus with diabetic neuropathy, unspecified: Secondary | ICD-10-CM | POA: Diagnosis not present

## 2017-03-07 DIAGNOSIS — I69354 Hemiplegia and hemiparesis following cerebral infarction affecting left non-dominant side: Secondary | ICD-10-CM | POA: Diagnosis not present

## 2017-03-07 DIAGNOSIS — I10 Essential (primary) hypertension: Secondary | ICD-10-CM | POA: Diagnosis not present

## 2017-03-07 DIAGNOSIS — F319 Bipolar disorder, unspecified: Secondary | ICD-10-CM | POA: Diagnosis not present

## 2017-03-07 DIAGNOSIS — M797 Fibromyalgia: Secondary | ICD-10-CM | POA: Diagnosis not present

## 2017-03-10 DIAGNOSIS — I69354 Hemiplegia and hemiparesis following cerebral infarction affecting left non-dominant side: Secondary | ICD-10-CM | POA: Diagnosis not present

## 2017-03-10 DIAGNOSIS — J45909 Unspecified asthma, uncomplicated: Secondary | ICD-10-CM | POA: Diagnosis not present

## 2017-03-10 DIAGNOSIS — I10 Essential (primary) hypertension: Secondary | ICD-10-CM | POA: Diagnosis not present

## 2017-03-10 DIAGNOSIS — F319 Bipolar disorder, unspecified: Secondary | ICD-10-CM | POA: Diagnosis not present

## 2017-03-10 DIAGNOSIS — E1140 Type 2 diabetes mellitus with diabetic neuropathy, unspecified: Secondary | ICD-10-CM | POA: Diagnosis not present

## 2017-03-10 DIAGNOSIS — M797 Fibromyalgia: Secondary | ICD-10-CM | POA: Diagnosis not present

## 2017-03-12 DIAGNOSIS — I69354 Hemiplegia and hemiparesis following cerebral infarction affecting left non-dominant side: Secondary | ICD-10-CM | POA: Diagnosis not present

## 2017-03-12 DIAGNOSIS — I10 Essential (primary) hypertension: Secondary | ICD-10-CM | POA: Diagnosis not present

## 2017-03-12 DIAGNOSIS — E1140 Type 2 diabetes mellitus with diabetic neuropathy, unspecified: Secondary | ICD-10-CM | POA: Diagnosis not present

## 2017-03-12 DIAGNOSIS — M797 Fibromyalgia: Secondary | ICD-10-CM | POA: Diagnosis not present

## 2017-03-12 DIAGNOSIS — J45909 Unspecified asthma, uncomplicated: Secondary | ICD-10-CM | POA: Diagnosis not present

## 2017-03-12 DIAGNOSIS — F319 Bipolar disorder, unspecified: Secondary | ICD-10-CM | POA: Diagnosis not present

## 2017-03-13 DIAGNOSIS — E1169 Type 2 diabetes mellitus with other specified complication: Secondary | ICD-10-CM | POA: Diagnosis not present

## 2017-03-13 DIAGNOSIS — G459 Transient cerebral ischemic attack, unspecified: Secondary | ICD-10-CM | POA: Diagnosis not present

## 2017-03-13 DIAGNOSIS — S6291XB Unspecified fracture of right wrist and hand, initial encounter for open fracture: Secondary | ICD-10-CM | POA: Diagnosis not present

## 2017-03-13 DIAGNOSIS — R42 Dizziness and giddiness: Secondary | ICD-10-CM | POA: Diagnosis not present

## 2017-03-13 DIAGNOSIS — C8190 Hodgkin lymphoma, unspecified, unspecified site: Secondary | ICD-10-CM | POA: Diagnosis not present

## 2017-03-13 DIAGNOSIS — I10 Essential (primary) hypertension: Secondary | ICD-10-CM | POA: Diagnosis not present

## 2017-03-14 DIAGNOSIS — N136 Pyonephrosis: Secondary | ICD-10-CM | POA: Diagnosis not present

## 2017-03-14 DIAGNOSIS — B9689 Other specified bacterial agents as the cause of diseases classified elsewhere: Secondary | ICD-10-CM | POA: Diagnosis not present

## 2017-03-14 DIAGNOSIS — K219 Gastro-esophageal reflux disease without esophagitis: Secondary | ICD-10-CM | POA: Diagnosis present

## 2017-03-14 DIAGNOSIS — K567 Ileus, unspecified: Secondary | ICD-10-CM | POA: Diagnosis not present

## 2017-03-14 DIAGNOSIS — F319 Bipolar disorder, unspecified: Secondary | ICD-10-CM | POA: Diagnosis present

## 2017-03-14 DIAGNOSIS — R194 Change in bowel habit: Secondary | ICD-10-CM | POA: Diagnosis not present

## 2017-03-14 DIAGNOSIS — E784 Other hyperlipidemia: Secondary | ICD-10-CM | POA: Diagnosis not present

## 2017-03-14 DIAGNOSIS — R509 Fever, unspecified: Secondary | ICD-10-CM | POA: Diagnosis not present

## 2017-03-14 DIAGNOSIS — G8929 Other chronic pain: Secondary | ICD-10-CM | POA: Diagnosis present

## 2017-03-14 DIAGNOSIS — N200 Calculus of kidney: Secondary | ICD-10-CM | POA: Diagnosis not present

## 2017-03-14 DIAGNOSIS — C8590 Non-Hodgkin lymphoma, unspecified, unspecified site: Secondary | ICD-10-CM | POA: Diagnosis not present

## 2017-03-14 DIAGNOSIS — B9620 Unspecified Escherichia coli [E. coli] as the cause of diseases classified elsewhere: Secondary | ICD-10-CM | POA: Diagnosis not present

## 2017-03-14 DIAGNOSIS — E119 Type 2 diabetes mellitus without complications: Secondary | ICD-10-CM | POA: Diagnosis present

## 2017-03-14 DIAGNOSIS — N12 Tubulo-interstitial nephritis, not specified as acute or chronic: Secondary | ICD-10-CM | POA: Diagnosis present

## 2017-03-14 DIAGNOSIS — M549 Dorsalgia, unspecified: Secondary | ICD-10-CM | POA: Diagnosis present

## 2017-03-14 DIAGNOSIS — Z8673 Personal history of transient ischemic attack (TIA), and cerebral infarction without residual deficits: Secondary | ICD-10-CM | POA: Diagnosis not present

## 2017-03-14 DIAGNOSIS — I10 Essential (primary) hypertension: Secondary | ICD-10-CM | POA: Diagnosis not present

## 2017-03-14 DIAGNOSIS — D649 Anemia, unspecified: Secondary | ICD-10-CM | POA: Diagnosis not present

## 2017-03-14 DIAGNOSIS — M797 Fibromyalgia: Secondary | ICD-10-CM | POA: Diagnosis not present

## 2017-03-14 DIAGNOSIS — N3281 Overactive bladder: Secondary | ICD-10-CM | POA: Diagnosis present

## 2017-03-14 DIAGNOSIS — R531 Weakness: Secondary | ICD-10-CM | POA: Diagnosis present

## 2017-03-14 DIAGNOSIS — K5900 Constipation, unspecified: Secondary | ICD-10-CM | POA: Diagnosis not present

## 2017-03-14 DIAGNOSIS — R140 Abdominal distension (gaseous): Secondary | ICD-10-CM | POA: Diagnosis not present

## 2017-03-14 DIAGNOSIS — R1084 Generalized abdominal pain: Secondary | ICD-10-CM | POA: Diagnosis not present

## 2017-03-14 DIAGNOSIS — E871 Hypo-osmolality and hyponatremia: Secondary | ICD-10-CM | POA: Diagnosis not present

## 2017-03-14 DIAGNOSIS — E559 Vitamin D deficiency, unspecified: Secondary | ICD-10-CM | POA: Diagnosis not present

## 2017-03-14 DIAGNOSIS — R784 Finding of other drugs of addictive potential in blood: Secondary | ICD-10-CM | POA: Diagnosis not present

## 2017-03-14 DIAGNOSIS — E785 Hyperlipidemia, unspecified: Secondary | ICD-10-CM | POA: Diagnosis present

## 2017-03-14 DIAGNOSIS — K589 Irritable bowel syndrome without diarrhea: Secondary | ICD-10-CM | POA: Diagnosis present

## 2017-03-14 DIAGNOSIS — R109 Unspecified abdominal pain: Secondary | ICD-10-CM | POA: Diagnosis not present

## 2017-03-14 DIAGNOSIS — A419 Sepsis, unspecified organism: Secondary | ICD-10-CM | POA: Diagnosis present

## 2017-03-14 DIAGNOSIS — R42 Dizziness and giddiness: Secondary | ICD-10-CM | POA: Diagnosis not present

## 2017-03-14 DIAGNOSIS — N319 Neuromuscular dysfunction of bladder, unspecified: Secondary | ICD-10-CM | POA: Diagnosis present

## 2017-03-14 DIAGNOSIS — N390 Urinary tract infection, site not specified: Secondary | ICD-10-CM | POA: Diagnosis not present

## 2017-03-23 DIAGNOSIS — I69354 Hemiplegia and hemiparesis following cerebral infarction affecting left non-dominant side: Secondary | ICD-10-CM | POA: Diagnosis not present

## 2017-03-23 DIAGNOSIS — M797 Fibromyalgia: Secondary | ICD-10-CM | POA: Diagnosis not present

## 2017-03-23 DIAGNOSIS — Z794 Long term (current) use of insulin: Secondary | ICD-10-CM | POA: Diagnosis not present

## 2017-03-23 DIAGNOSIS — E1140 Type 2 diabetes mellitus with diabetic neuropathy, unspecified: Secondary | ICD-10-CM | POA: Diagnosis not present

## 2017-03-23 DIAGNOSIS — Z87440 Personal history of urinary (tract) infections: Secondary | ICD-10-CM | POA: Diagnosis not present

## 2017-03-23 DIAGNOSIS — F319 Bipolar disorder, unspecified: Secondary | ICD-10-CM | POA: Diagnosis not present

## 2017-03-23 DIAGNOSIS — Z9181 History of falling: Secondary | ICD-10-CM | POA: Diagnosis not present

## 2017-03-23 DIAGNOSIS — I10 Essential (primary) hypertension: Secondary | ICD-10-CM | POA: Diagnosis not present

## 2017-03-23 DIAGNOSIS — G8929 Other chronic pain: Secondary | ICD-10-CM | POA: Diagnosis not present

## 2017-03-23 DIAGNOSIS — F419 Anxiety disorder, unspecified: Secondary | ICD-10-CM | POA: Diagnosis not present

## 2017-03-23 DIAGNOSIS — F411 Generalized anxiety disorder: Secondary | ICD-10-CM | POA: Diagnosis not present

## 2017-03-25 DIAGNOSIS — E1169 Type 2 diabetes mellitus with other specified complication: Secondary | ICD-10-CM | POA: Diagnosis not present

## 2017-03-25 DIAGNOSIS — B9689 Other specified bacterial agents as the cause of diseases classified elsewhere: Secondary | ICD-10-CM | POA: Diagnosis not present

## 2017-03-25 DIAGNOSIS — E784 Other hyperlipidemia: Secondary | ICD-10-CM | POA: Diagnosis not present

## 2017-03-25 DIAGNOSIS — I10 Essential (primary) hypertension: Secondary | ICD-10-CM | POA: Diagnosis not present

## 2017-03-25 DIAGNOSIS — E785 Hyperlipidemia, unspecified: Secondary | ICD-10-CM | POA: Diagnosis not present

## 2017-03-25 DIAGNOSIS — N390 Urinary tract infection, site not specified: Secondary | ICD-10-CM | POA: Diagnosis not present

## 2017-03-26 DIAGNOSIS — F319 Bipolar disorder, unspecified: Secondary | ICD-10-CM | POA: Diagnosis not present

## 2017-03-26 DIAGNOSIS — F419 Anxiety disorder, unspecified: Secondary | ICD-10-CM | POA: Diagnosis not present

## 2017-03-26 DIAGNOSIS — F4310 Post-traumatic stress disorder, unspecified: Secondary | ICD-10-CM | POA: Diagnosis not present

## 2017-03-26 DIAGNOSIS — M797 Fibromyalgia: Secondary | ICD-10-CM | POA: Diagnosis not present

## 2017-03-26 DIAGNOSIS — I69354 Hemiplegia and hemiparesis following cerebral infarction affecting left non-dominant side: Secondary | ICD-10-CM | POA: Diagnosis not present

## 2017-03-26 DIAGNOSIS — G8929 Other chronic pain: Secondary | ICD-10-CM | POA: Diagnosis not present

## 2017-03-26 DIAGNOSIS — E1140 Type 2 diabetes mellitus with diabetic neuropathy, unspecified: Secondary | ICD-10-CM | POA: Diagnosis not present

## 2017-03-26 DIAGNOSIS — I10 Essential (primary) hypertension: Secondary | ICD-10-CM | POA: Diagnosis not present

## 2017-03-28 DIAGNOSIS — M797 Fibromyalgia: Secondary | ICD-10-CM | POA: Diagnosis not present

## 2017-03-28 DIAGNOSIS — E1140 Type 2 diabetes mellitus with diabetic neuropathy, unspecified: Secondary | ICD-10-CM | POA: Diagnosis not present

## 2017-03-28 DIAGNOSIS — M65321 Trigger finger, right index finger: Secondary | ICD-10-CM | POA: Diagnosis not present

## 2017-03-28 DIAGNOSIS — I10 Essential (primary) hypertension: Secondary | ICD-10-CM | POA: Diagnosis not present

## 2017-03-28 DIAGNOSIS — I69354 Hemiplegia and hemiparesis following cerebral infarction affecting left non-dominant side: Secondary | ICD-10-CM | POA: Diagnosis not present

## 2017-03-28 DIAGNOSIS — M79641 Pain in right hand: Secondary | ICD-10-CM | POA: Diagnosis not present

## 2017-03-28 DIAGNOSIS — F319 Bipolar disorder, unspecified: Secondary | ICD-10-CM | POA: Diagnosis not present

## 2017-03-28 DIAGNOSIS — G8929 Other chronic pain: Secondary | ICD-10-CM | POA: Diagnosis not present

## 2017-03-31 DIAGNOSIS — I10 Essential (primary) hypertension: Secondary | ICD-10-CM | POA: Diagnosis not present

## 2017-03-31 DIAGNOSIS — G8929 Other chronic pain: Secondary | ICD-10-CM | POA: Diagnosis not present

## 2017-03-31 DIAGNOSIS — E1140 Type 2 diabetes mellitus with diabetic neuropathy, unspecified: Secondary | ICD-10-CM | POA: Diagnosis not present

## 2017-03-31 DIAGNOSIS — I69354 Hemiplegia and hemiparesis following cerebral infarction affecting left non-dominant side: Secondary | ICD-10-CM | POA: Diagnosis not present

## 2017-03-31 DIAGNOSIS — F319 Bipolar disorder, unspecified: Secondary | ICD-10-CM | POA: Diagnosis not present

## 2017-03-31 DIAGNOSIS — M797 Fibromyalgia: Secondary | ICD-10-CM | POA: Diagnosis not present

## 2017-04-02 DIAGNOSIS — G8929 Other chronic pain: Secondary | ICD-10-CM | POA: Diagnosis not present

## 2017-04-02 DIAGNOSIS — E1165 Type 2 diabetes mellitus with hyperglycemia: Secondary | ICD-10-CM | POA: Diagnosis not present

## 2017-04-02 DIAGNOSIS — M797 Fibromyalgia: Secondary | ICD-10-CM | POA: Diagnosis not present

## 2017-04-02 DIAGNOSIS — I10 Essential (primary) hypertension: Secondary | ICD-10-CM | POA: Diagnosis not present

## 2017-04-02 DIAGNOSIS — I69354 Hemiplegia and hemiparesis following cerebral infarction affecting left non-dominant side: Secondary | ICD-10-CM | POA: Diagnosis not present

## 2017-04-02 DIAGNOSIS — E7800 Pure hypercholesterolemia, unspecified: Secondary | ICD-10-CM | POA: Diagnosis not present

## 2017-04-02 DIAGNOSIS — E559 Vitamin D deficiency, unspecified: Secondary | ICD-10-CM | POA: Diagnosis not present

## 2017-04-02 DIAGNOSIS — E1140 Type 2 diabetes mellitus with diabetic neuropathy, unspecified: Secondary | ICD-10-CM | POA: Diagnosis not present

## 2017-04-02 DIAGNOSIS — F319 Bipolar disorder, unspecified: Secondary | ICD-10-CM | POA: Diagnosis not present

## 2017-04-04 DIAGNOSIS — J8410 Pulmonary fibrosis, unspecified: Secondary | ICD-10-CM | POA: Diagnosis not present

## 2017-04-04 DIAGNOSIS — E1169 Type 2 diabetes mellitus with other specified complication: Secondary | ICD-10-CM | POA: Diagnosis not present

## 2017-04-04 DIAGNOSIS — E119 Type 2 diabetes mellitus without complications: Secondary | ICD-10-CM | POA: Diagnosis not present

## 2017-04-04 DIAGNOSIS — R933 Abnormal findings on diagnostic imaging of other parts of digestive tract: Secondary | ICD-10-CM | POA: Diagnosis not present

## 2017-04-04 DIAGNOSIS — E785 Hyperlipidemia, unspecified: Secondary | ICD-10-CM | POA: Diagnosis present

## 2017-04-04 DIAGNOSIS — E871 Hypo-osmolality and hyponatremia: Secondary | ICD-10-CM | POA: Diagnosis not present

## 2017-04-04 DIAGNOSIS — R109 Unspecified abdominal pain: Secondary | ICD-10-CM | POA: Diagnosis not present

## 2017-04-04 DIAGNOSIS — E878 Other disorders of electrolyte and fluid balance, not elsewhere classified: Secondary | ICD-10-CM | POA: Diagnosis not present

## 2017-04-04 DIAGNOSIS — R140 Abdominal distension (gaseous): Secondary | ICD-10-CM | POA: Diagnosis not present

## 2017-04-04 DIAGNOSIS — E872 Acidosis: Secondary | ICD-10-CM | POA: Diagnosis not present

## 2017-04-04 DIAGNOSIS — Z87442 Personal history of urinary calculi: Secondary | ICD-10-CM | POA: Diagnosis not present

## 2017-04-04 DIAGNOSIS — K5900 Constipation, unspecified: Secondary | ICD-10-CM | POA: Diagnosis not present

## 2017-04-04 DIAGNOSIS — J449 Chronic obstructive pulmonary disease, unspecified: Secondary | ICD-10-CM | POA: Diagnosis not present

## 2017-04-04 DIAGNOSIS — R112 Nausea with vomiting, unspecified: Secondary | ICD-10-CM | POA: Diagnosis present

## 2017-04-04 DIAGNOSIS — Z87440 Personal history of urinary (tract) infections: Secondary | ICD-10-CM | POA: Diagnosis not present

## 2017-04-04 DIAGNOSIS — M797 Fibromyalgia: Secondary | ICD-10-CM | POA: Diagnosis present

## 2017-04-04 DIAGNOSIS — K649 Unspecified hemorrhoids: Secondary | ICD-10-CM | POA: Diagnosis not present

## 2017-04-04 DIAGNOSIS — R1084 Generalized abdominal pain: Secondary | ICD-10-CM | POA: Diagnosis not present

## 2017-04-04 DIAGNOSIS — I10 Essential (primary) hypertension: Secondary | ICD-10-CM | POA: Diagnosis not present

## 2017-04-04 DIAGNOSIS — N319 Neuromuscular dysfunction of bladder, unspecified: Secondary | ICD-10-CM | POA: Diagnosis present

## 2017-04-04 DIAGNOSIS — K589 Irritable bowel syndrome without diarrhea: Secondary | ICD-10-CM | POA: Diagnosis present

## 2017-04-04 DIAGNOSIS — C8190 Hodgkin lymphoma, unspecified, unspecified site: Secondary | ICD-10-CM | POA: Diagnosis not present

## 2017-04-04 DIAGNOSIS — E784 Other hyperlipidemia: Secondary | ICD-10-CM | POA: Diagnosis not present

## 2017-04-04 DIAGNOSIS — F319 Bipolar disorder, unspecified: Secondary | ICD-10-CM | POA: Diagnosis present

## 2017-04-04 DIAGNOSIS — R110 Nausea: Secondary | ICD-10-CM | POA: Diagnosis not present

## 2017-04-04 DIAGNOSIS — Z85828 Personal history of other malignant neoplasm of skin: Secondary | ICD-10-CM | POA: Diagnosis not present

## 2017-04-04 DIAGNOSIS — E875 Hyperkalemia: Secondary | ICD-10-CM | POA: Diagnosis not present

## 2017-04-04 DIAGNOSIS — R1110 Vomiting, unspecified: Secondary | ICD-10-CM | POA: Diagnosis not present

## 2017-04-04 DIAGNOSIS — K641 Second degree hemorrhoids: Secondary | ICD-10-CM | POA: Diagnosis not present

## 2017-04-09 DIAGNOSIS — E1140 Type 2 diabetes mellitus with diabetic neuropathy, unspecified: Secondary | ICD-10-CM | POA: Diagnosis not present

## 2017-04-09 DIAGNOSIS — I10 Essential (primary) hypertension: Secondary | ICD-10-CM | POA: Diagnosis not present

## 2017-04-09 DIAGNOSIS — M797 Fibromyalgia: Secondary | ICD-10-CM | POA: Diagnosis not present

## 2017-04-09 DIAGNOSIS — I69354 Hemiplegia and hemiparesis following cerebral infarction affecting left non-dominant side: Secondary | ICD-10-CM | POA: Diagnosis not present

## 2017-04-09 DIAGNOSIS — F319 Bipolar disorder, unspecified: Secondary | ICD-10-CM | POA: Diagnosis not present

## 2017-04-09 DIAGNOSIS — G8929 Other chronic pain: Secondary | ICD-10-CM | POA: Diagnosis not present

## 2017-04-10 DIAGNOSIS — G4730 Sleep apnea, unspecified: Secondary | ICD-10-CM | POA: Diagnosis not present

## 2017-04-10 DIAGNOSIS — Z789 Other specified health status: Secondary | ICD-10-CM | POA: Diagnosis not present

## 2017-04-10 DIAGNOSIS — J45909 Unspecified asthma, uncomplicated: Secondary | ICD-10-CM | POA: Diagnosis not present

## 2017-04-10 DIAGNOSIS — R911 Solitary pulmonary nodule: Secondary | ICD-10-CM | POA: Diagnosis not present

## 2017-04-12 DIAGNOSIS — F319 Bipolar disorder, unspecified: Secondary | ICD-10-CM | POA: Diagnosis not present

## 2017-04-12 DIAGNOSIS — I69354 Hemiplegia and hemiparesis following cerebral infarction affecting left non-dominant side: Secondary | ICD-10-CM | POA: Diagnosis not present

## 2017-04-12 DIAGNOSIS — I10 Essential (primary) hypertension: Secondary | ICD-10-CM | POA: Diagnosis not present

## 2017-04-12 DIAGNOSIS — E1140 Type 2 diabetes mellitus with diabetic neuropathy, unspecified: Secondary | ICD-10-CM | POA: Diagnosis not present

## 2017-04-12 DIAGNOSIS — G8929 Other chronic pain: Secondary | ICD-10-CM | POA: Diagnosis not present

## 2017-04-12 DIAGNOSIS — M797 Fibromyalgia: Secondary | ICD-10-CM | POA: Diagnosis not present

## 2017-04-14 DIAGNOSIS — M65321 Trigger finger, right index finger: Secondary | ICD-10-CM | POA: Diagnosis not present

## 2017-04-15 DIAGNOSIS — G8929 Other chronic pain: Secondary | ICD-10-CM | POA: Diagnosis not present

## 2017-04-15 DIAGNOSIS — F319 Bipolar disorder, unspecified: Secondary | ICD-10-CM | POA: Diagnosis not present

## 2017-04-15 DIAGNOSIS — M797 Fibromyalgia: Secondary | ICD-10-CM | POA: Diagnosis not present

## 2017-04-15 DIAGNOSIS — E1140 Type 2 diabetes mellitus with diabetic neuropathy, unspecified: Secondary | ICD-10-CM | POA: Diagnosis not present

## 2017-04-15 DIAGNOSIS — I10 Essential (primary) hypertension: Secondary | ICD-10-CM | POA: Diagnosis not present

## 2017-04-15 DIAGNOSIS — I69354 Hemiplegia and hemiparesis following cerebral infarction affecting left non-dominant side: Secondary | ICD-10-CM | POA: Diagnosis not present

## 2017-04-16 DIAGNOSIS — R590 Localized enlarged lymph nodes: Secondary | ICD-10-CM | POA: Diagnosis not present

## 2017-04-17 DIAGNOSIS — C8190 Hodgkin lymphoma, unspecified, unspecified site: Secondary | ICD-10-CM | POA: Diagnosis not present

## 2017-04-17 DIAGNOSIS — I10 Essential (primary) hypertension: Secondary | ICD-10-CM | POA: Diagnosis not present

## 2017-04-17 DIAGNOSIS — E1140 Type 2 diabetes mellitus with diabetic neuropathy, unspecified: Secondary | ICD-10-CM | POA: Diagnosis not present

## 2017-04-17 DIAGNOSIS — R0609 Other forms of dyspnea: Secondary | ICD-10-CM | POA: Diagnosis not present

## 2017-04-17 DIAGNOSIS — I69354 Hemiplegia and hemiparesis following cerebral infarction affecting left non-dominant side: Secondary | ICD-10-CM | POA: Diagnosis not present

## 2017-04-17 DIAGNOSIS — G8929 Other chronic pain: Secondary | ICD-10-CM | POA: Diagnosis not present

## 2017-04-17 DIAGNOSIS — R5383 Other fatigue: Secondary | ICD-10-CM | POA: Diagnosis not present

## 2017-04-17 DIAGNOSIS — E538 Deficiency of other specified B group vitamins: Secondary | ICD-10-CM | POA: Diagnosis not present

## 2017-04-17 DIAGNOSIS — I110 Hypertensive heart disease with heart failure: Secondary | ICD-10-CM | POA: Diagnosis not present

## 2017-04-17 DIAGNOSIS — G894 Chronic pain syndrome: Secondary | ICD-10-CM | POA: Diagnosis not present

## 2017-04-17 DIAGNOSIS — F319 Bipolar disorder, unspecified: Secondary | ICD-10-CM | POA: Diagnosis not present

## 2017-04-17 DIAGNOSIS — E785 Hyperlipidemia, unspecified: Secondary | ICD-10-CM | POA: Diagnosis not present

## 2017-04-17 DIAGNOSIS — M797 Fibromyalgia: Secondary | ICD-10-CM | POA: Diagnosis not present

## 2017-04-17 DIAGNOSIS — J449 Chronic obstructive pulmonary disease, unspecified: Secondary | ICD-10-CM | POA: Diagnosis not present

## 2017-04-17 DIAGNOSIS — Z23 Encounter for immunization: Secondary | ICD-10-CM | POA: Diagnosis not present

## 2017-04-18 DIAGNOSIS — I69354 Hemiplegia and hemiparesis following cerebral infarction affecting left non-dominant side: Secondary | ICD-10-CM | POA: Diagnosis not present

## 2017-04-18 DIAGNOSIS — E1140 Type 2 diabetes mellitus with diabetic neuropathy, unspecified: Secondary | ICD-10-CM | POA: Diagnosis not present

## 2017-04-18 DIAGNOSIS — I10 Essential (primary) hypertension: Secondary | ICD-10-CM | POA: Diagnosis not present

## 2017-04-18 DIAGNOSIS — F319 Bipolar disorder, unspecified: Secondary | ICD-10-CM | POA: Diagnosis not present

## 2017-04-18 DIAGNOSIS — G8929 Other chronic pain: Secondary | ICD-10-CM | POA: Diagnosis not present

## 2017-04-18 DIAGNOSIS — M797 Fibromyalgia: Secondary | ICD-10-CM | POA: Diagnosis not present

## 2017-04-19 DIAGNOSIS — E1140 Type 2 diabetes mellitus with diabetic neuropathy, unspecified: Secondary | ICD-10-CM | POA: Diagnosis not present

## 2017-04-19 DIAGNOSIS — F319 Bipolar disorder, unspecified: Secondary | ICD-10-CM | POA: Diagnosis not present

## 2017-04-19 DIAGNOSIS — M797 Fibromyalgia: Secondary | ICD-10-CM | POA: Diagnosis not present

## 2017-04-19 DIAGNOSIS — I69354 Hemiplegia and hemiparesis following cerebral infarction affecting left non-dominant side: Secondary | ICD-10-CM | POA: Diagnosis not present

## 2017-04-19 DIAGNOSIS — G8929 Other chronic pain: Secondary | ICD-10-CM | POA: Diagnosis not present

## 2017-04-19 DIAGNOSIS — I10 Essential (primary) hypertension: Secondary | ICD-10-CM | POA: Diagnosis not present

## 2017-04-21 DIAGNOSIS — I10 Essential (primary) hypertension: Secondary | ICD-10-CM | POA: Diagnosis not present

## 2017-04-21 DIAGNOSIS — I69354 Hemiplegia and hemiparesis following cerebral infarction affecting left non-dominant side: Secondary | ICD-10-CM | POA: Diagnosis not present

## 2017-04-21 DIAGNOSIS — M65321 Trigger finger, right index finger: Secondary | ICD-10-CM | POA: Diagnosis not present

## 2017-04-21 DIAGNOSIS — F319 Bipolar disorder, unspecified: Secondary | ICD-10-CM | POA: Diagnosis not present

## 2017-04-21 DIAGNOSIS — M797 Fibromyalgia: Secondary | ICD-10-CM | POA: Diagnosis not present

## 2017-04-21 DIAGNOSIS — E1140 Type 2 diabetes mellitus with diabetic neuropathy, unspecified: Secondary | ICD-10-CM | POA: Diagnosis not present

## 2017-04-21 DIAGNOSIS — G8929 Other chronic pain: Secondary | ICD-10-CM | POA: Diagnosis not present

## 2017-04-22 DIAGNOSIS — Z789 Other specified health status: Secondary | ICD-10-CM | POA: Diagnosis not present

## 2017-04-22 DIAGNOSIS — R911 Solitary pulmonary nodule: Secondary | ICD-10-CM | POA: Diagnosis not present

## 2017-04-22 DIAGNOSIS — G4730 Sleep apnea, unspecified: Secondary | ICD-10-CM | POA: Diagnosis not present

## 2017-04-22 DIAGNOSIS — J45909 Unspecified asthma, uncomplicated: Secondary | ICD-10-CM | POA: Diagnosis not present

## 2017-04-23 DIAGNOSIS — I69354 Hemiplegia and hemiparesis following cerebral infarction affecting left non-dominant side: Secondary | ICD-10-CM | POA: Diagnosis not present

## 2017-04-23 DIAGNOSIS — F319 Bipolar disorder, unspecified: Secondary | ICD-10-CM | POA: Diagnosis not present

## 2017-04-23 DIAGNOSIS — M797 Fibromyalgia: Secondary | ICD-10-CM | POA: Diagnosis not present

## 2017-04-23 DIAGNOSIS — I10 Essential (primary) hypertension: Secondary | ICD-10-CM | POA: Diagnosis not present

## 2017-04-23 DIAGNOSIS — E1140 Type 2 diabetes mellitus with diabetic neuropathy, unspecified: Secondary | ICD-10-CM | POA: Diagnosis not present

## 2017-04-23 DIAGNOSIS — G8929 Other chronic pain: Secondary | ICD-10-CM | POA: Diagnosis not present

## 2017-04-24 DIAGNOSIS — F319 Bipolar disorder, unspecified: Secondary | ICD-10-CM | POA: Diagnosis not present

## 2017-04-24 DIAGNOSIS — I69354 Hemiplegia and hemiparesis following cerebral infarction affecting left non-dominant side: Secondary | ICD-10-CM | POA: Diagnosis not present

## 2017-04-24 DIAGNOSIS — E1140 Type 2 diabetes mellitus with diabetic neuropathy, unspecified: Secondary | ICD-10-CM | POA: Diagnosis not present

## 2017-04-24 DIAGNOSIS — I10 Essential (primary) hypertension: Secondary | ICD-10-CM | POA: Diagnosis not present

## 2017-04-24 DIAGNOSIS — M797 Fibromyalgia: Secondary | ICD-10-CM | POA: Diagnosis not present

## 2017-04-24 DIAGNOSIS — G8929 Other chronic pain: Secondary | ICD-10-CM | POA: Diagnosis not present

## 2017-04-29 DIAGNOSIS — I2699 Other pulmonary embolism without acute cor pulmonale: Secondary | ICD-10-CM | POA: Diagnosis not present

## 2017-04-29 DIAGNOSIS — R0602 Shortness of breath: Secondary | ICD-10-CM | POA: Diagnosis not present

## 2017-04-30 DIAGNOSIS — R0602 Shortness of breath: Secondary | ICD-10-CM | POA: Diagnosis not present

## 2017-04-30 DIAGNOSIS — F319 Bipolar disorder, unspecified: Secondary | ICD-10-CM | POA: Diagnosis not present

## 2017-04-30 DIAGNOSIS — R55 Syncope and collapse: Secondary | ICD-10-CM | POA: Diagnosis not present

## 2017-04-30 DIAGNOSIS — S93602A Unspecified sprain of left foot, initial encounter: Secondary | ICD-10-CM | POA: Diagnosis not present

## 2017-04-30 DIAGNOSIS — Z743 Need for continuous supervision: Secondary | ICD-10-CM | POA: Diagnosis not present

## 2017-04-30 DIAGNOSIS — E119 Type 2 diabetes mellitus without complications: Secondary | ICD-10-CM | POA: Diagnosis not present

## 2017-04-30 DIAGNOSIS — E785 Hyperlipidemia, unspecified: Secondary | ICD-10-CM | POA: Diagnosis not present

## 2017-04-30 DIAGNOSIS — R531 Weakness: Secondary | ICD-10-CM | POA: Diagnosis not present

## 2017-04-30 DIAGNOSIS — Z7984 Long term (current) use of oral hypoglycemic drugs: Secondary | ICD-10-CM | POA: Diagnosis not present

## 2017-04-30 DIAGNOSIS — I10 Essential (primary) hypertension: Secondary | ICD-10-CM | POA: Diagnosis not present

## 2017-04-30 DIAGNOSIS — G510 Bell's palsy: Secondary | ICD-10-CM | POA: Diagnosis not present

## 2017-04-30 DIAGNOSIS — S99922A Unspecified injury of left foot, initial encounter: Secondary | ICD-10-CM | POA: Diagnosis not present

## 2017-04-30 DIAGNOSIS — F419 Anxiety disorder, unspecified: Secondary | ICD-10-CM | POA: Diagnosis not present

## 2017-04-30 DIAGNOSIS — R918 Other nonspecific abnormal finding of lung field: Secondary | ICD-10-CM | POA: Diagnosis not present

## 2017-05-02 DIAGNOSIS — S0083XA Contusion of other part of head, initial encounter: Secondary | ICD-10-CM | POA: Diagnosis not present

## 2017-05-02 DIAGNOSIS — S8991XA Unspecified injury of right lower leg, initial encounter: Secondary | ICD-10-CM | POA: Diagnosis not present

## 2017-05-02 DIAGNOSIS — W19XXXA Unspecified fall, initial encounter: Secondary | ICD-10-CM | POA: Diagnosis not present

## 2017-05-02 DIAGNOSIS — E7800 Pure hypercholesterolemia, unspecified: Secondary | ICD-10-CM | POA: Diagnosis present

## 2017-05-02 DIAGNOSIS — S92302S Fracture of unspecified metatarsal bone(s), left foot, sequela: Secondary | ICD-10-CM | POA: Diagnosis not present

## 2017-05-02 DIAGNOSIS — I119 Hypertensive heart disease without heart failure: Secondary | ICD-10-CM | POA: Diagnosis not present

## 2017-05-02 DIAGNOSIS — M25561 Pain in right knee: Secondary | ICD-10-CM | POA: Diagnosis not present

## 2017-05-02 DIAGNOSIS — S92335A Nondisplaced fracture of third metatarsal bone, left foot, initial encounter for closed fracture: Secondary | ICD-10-CM | POA: Diagnosis present

## 2017-05-02 DIAGNOSIS — I10 Essential (primary) hypertension: Secondary | ICD-10-CM | POA: Diagnosis not present

## 2017-05-02 DIAGNOSIS — E7849 Other hyperlipidemia: Secondary | ICD-10-CM | POA: Diagnosis not present

## 2017-05-02 DIAGNOSIS — R220 Localized swelling, mass and lump, head: Secondary | ICD-10-CM | POA: Diagnosis not present

## 2017-05-02 DIAGNOSIS — I739 Peripheral vascular disease, unspecified: Secondary | ICD-10-CM | POA: Diagnosis not present

## 2017-05-02 DIAGNOSIS — R9431 Abnormal electrocardiogram [ECG] [EKG]: Secondary | ICD-10-CM | POA: Diagnosis not present

## 2017-05-02 DIAGNOSIS — S92322A Displaced fracture of second metatarsal bone, left foot, initial encounter for closed fracture: Secondary | ICD-10-CM | POA: Diagnosis not present

## 2017-05-02 DIAGNOSIS — R51 Headache: Secondary | ICD-10-CM | POA: Diagnosis not present

## 2017-05-02 DIAGNOSIS — T148XXA Other injury of unspecified body region, initial encounter: Secondary | ICD-10-CM | POA: Diagnosis not present

## 2017-05-02 DIAGNOSIS — N319 Neuromuscular dysfunction of bladder, unspecified: Secondary | ICD-10-CM | POA: Diagnosis present

## 2017-05-02 DIAGNOSIS — S92332A Displaced fracture of third metatarsal bone, left foot, initial encounter for closed fracture: Secondary | ICD-10-CM | POA: Diagnosis not present

## 2017-05-02 DIAGNOSIS — E119 Type 2 diabetes mellitus without complications: Secondary | ICD-10-CM | POA: Diagnosis not present

## 2017-05-02 DIAGNOSIS — R5381 Other malaise: Secondary | ICD-10-CM | POA: Diagnosis not present

## 2017-05-02 DIAGNOSIS — N3289 Other specified disorders of bladder: Secondary | ICD-10-CM | POA: Diagnosis not present

## 2017-05-02 DIAGNOSIS — R531 Weakness: Secondary | ICD-10-CM | POA: Diagnosis not present

## 2017-05-02 DIAGNOSIS — G4733 Obstructive sleep apnea (adult) (pediatric): Secondary | ICD-10-CM | POA: Diagnosis present

## 2017-05-02 DIAGNOSIS — S0990XA Unspecified injury of head, initial encounter: Secondary | ICD-10-CM | POA: Diagnosis not present

## 2017-05-02 DIAGNOSIS — F319 Bipolar disorder, unspecified: Secondary | ICD-10-CM | POA: Diagnosis not present

## 2017-05-02 DIAGNOSIS — M47816 Spondylosis without myelopathy or radiculopathy, lumbar region: Secondary | ICD-10-CM | POA: Diagnosis not present

## 2017-05-02 DIAGNOSIS — E871 Hypo-osmolality and hyponatremia: Secondary | ICD-10-CM | POA: Diagnosis not present

## 2017-05-02 DIAGNOSIS — E1142 Type 2 diabetes mellitus with diabetic polyneuropathy: Secondary | ICD-10-CM | POA: Diagnosis not present

## 2017-05-02 DIAGNOSIS — S92345A Nondisplaced fracture of fourth metatarsal bone, left foot, initial encounter for closed fracture: Secondary | ICD-10-CM | POA: Diagnosis present

## 2017-05-02 DIAGNOSIS — R55 Syncope and collapse: Secondary | ICD-10-CM | POA: Diagnosis not present

## 2017-05-02 DIAGNOSIS — S92342A Displaced fracture of fourth metatarsal bone, left foot, initial encounter for closed fracture: Secondary | ICD-10-CM | POA: Diagnosis not present

## 2017-05-02 DIAGNOSIS — K219 Gastro-esophageal reflux disease without esophagitis: Secondary | ICD-10-CM | POA: Diagnosis present

## 2017-05-02 DIAGNOSIS — S0993XA Unspecified injury of face, initial encounter: Secondary | ICD-10-CM | POA: Diagnosis not present

## 2017-05-02 DIAGNOSIS — J449 Chronic obstructive pulmonary disease, unspecified: Secondary | ICD-10-CM | POA: Diagnosis not present

## 2017-05-02 DIAGNOSIS — S92325A Nondisplaced fracture of second metatarsal bone, left foot, initial encounter for closed fracture: Secondary | ICD-10-CM | POA: Diagnosis present

## 2017-05-02 DIAGNOSIS — M797 Fibromyalgia: Secondary | ICD-10-CM | POA: Diagnosis not present

## 2017-05-02 DIAGNOSIS — I951 Orthostatic hypotension: Secondary | ICD-10-CM | POA: Diagnosis not present

## 2017-05-06 DIAGNOSIS — S92302S Fracture of unspecified metatarsal bone(s), left foot, sequela: Secondary | ICD-10-CM | POA: Diagnosis not present

## 2017-05-06 DIAGNOSIS — I10 Essential (primary) hypertension: Secondary | ICD-10-CM | POA: Diagnosis not present

## 2017-05-06 DIAGNOSIS — K219 Gastro-esophageal reflux disease without esophagitis: Secondary | ICD-10-CM | POA: Diagnosis not present

## 2017-05-06 DIAGNOSIS — R29898 Other symptoms and signs involving the musculoskeletal system: Secondary | ICD-10-CM | POA: Diagnosis not present

## 2017-05-06 DIAGNOSIS — N3289 Other specified disorders of bladder: Secondary | ICD-10-CM | POA: Diagnosis not present

## 2017-05-06 DIAGNOSIS — T148XXA Other injury of unspecified body region, initial encounter: Secondary | ICD-10-CM | POA: Diagnosis not present

## 2017-05-06 DIAGNOSIS — N319 Neuromuscular dysfunction of bladder, unspecified: Secondary | ICD-10-CM | POA: Diagnosis not present

## 2017-05-06 DIAGNOSIS — I951 Orthostatic hypotension: Secondary | ICD-10-CM | POA: Diagnosis not present

## 2017-05-06 DIAGNOSIS — S92345A Nondisplaced fracture of fourth metatarsal bone, left foot, initial encounter for closed fracture: Secondary | ICD-10-CM | POA: Diagnosis not present

## 2017-05-06 DIAGNOSIS — S92325D Nondisplaced fracture of second metatarsal bone, left foot, subsequent encounter for fracture with routine healing: Secondary | ICD-10-CM | POA: Diagnosis not present

## 2017-05-06 DIAGNOSIS — R42 Dizziness and giddiness: Secondary | ICD-10-CM | POA: Diagnosis not present

## 2017-05-06 DIAGNOSIS — I69354 Hemiplegia and hemiparesis following cerebral infarction affecting left non-dominant side: Secondary | ICD-10-CM | POA: Diagnosis not present

## 2017-05-06 DIAGNOSIS — E785 Hyperlipidemia, unspecified: Secondary | ICD-10-CM | POA: Diagnosis not present

## 2017-05-06 DIAGNOSIS — M2550 Pain in unspecified joint: Secondary | ICD-10-CM | POA: Diagnosis not present

## 2017-05-06 DIAGNOSIS — I119 Hypertensive heart disease without heart failure: Secondary | ICD-10-CM | POA: Diagnosis not present

## 2017-05-06 DIAGNOSIS — F4323 Adjustment disorder with mixed anxiety and depressed mood: Secondary | ICD-10-CM | POA: Diagnosis not present

## 2017-05-06 DIAGNOSIS — S92315A Nondisplaced fracture of first metatarsal bone, left foot, initial encounter for closed fracture: Secondary | ICD-10-CM | POA: Diagnosis not present

## 2017-05-06 DIAGNOSIS — S92301D Fracture of unspecified metatarsal bone(s), right foot, subsequent encounter for fracture with routine healing: Secondary | ICD-10-CM | POA: Diagnosis not present

## 2017-05-06 DIAGNOSIS — W19XXXA Unspecified fall, initial encounter: Secondary | ICD-10-CM | POA: Diagnosis not present

## 2017-05-06 DIAGNOSIS — R262 Difficulty in walking, not elsewhere classified: Secondary | ICD-10-CM | POA: Diagnosis not present

## 2017-05-06 DIAGNOSIS — S0081XA Abrasion of other part of head, initial encounter: Secondary | ICD-10-CM | POA: Diagnosis not present

## 2017-05-06 DIAGNOSIS — R55 Syncope and collapse: Secondary | ICD-10-CM | POA: Diagnosis not present

## 2017-05-06 DIAGNOSIS — M6259 Muscle wasting and atrophy, not elsewhere classified, multiple sites: Secondary | ICD-10-CM | POA: Diagnosis not present

## 2017-05-06 DIAGNOSIS — E119 Type 2 diabetes mellitus without complications: Secondary | ICD-10-CM | POA: Diagnosis not present

## 2017-05-06 DIAGNOSIS — R5381 Other malaise: Secondary | ICD-10-CM | POA: Diagnosis not present

## 2017-05-06 DIAGNOSIS — S92325A Nondisplaced fracture of second metatarsal bone, left foot, initial encounter for closed fracture: Secondary | ICD-10-CM | POA: Diagnosis not present

## 2017-05-06 DIAGNOSIS — S92335A Nondisplaced fracture of third metatarsal bone, left foot, initial encounter for closed fracture: Secondary | ICD-10-CM | POA: Diagnosis not present

## 2017-05-06 DIAGNOSIS — I739 Peripheral vascular disease, unspecified: Secondary | ICD-10-CM | POA: Diagnosis not present

## 2017-05-06 DIAGNOSIS — R531 Weakness: Secondary | ICD-10-CM | POA: Diagnosis not present

## 2017-05-06 DIAGNOSIS — R809 Proteinuria, unspecified: Secondary | ICD-10-CM | POA: Diagnosis not present

## 2017-05-06 DIAGNOSIS — S92302D Fracture of unspecified metatarsal bone(s), left foot, subsequent encounter for fracture with routine healing: Secondary | ICD-10-CM | POA: Diagnosis not present

## 2017-05-06 DIAGNOSIS — J449 Chronic obstructive pulmonary disease, unspecified: Secondary | ICD-10-CM | POA: Diagnosis not present

## 2017-05-06 DIAGNOSIS — F319 Bipolar disorder, unspecified: Secondary | ICD-10-CM | POA: Diagnosis not present

## 2017-05-06 DIAGNOSIS — K5900 Constipation, unspecified: Secondary | ICD-10-CM | POA: Diagnosis not present

## 2017-05-06 DIAGNOSIS — E871 Hypo-osmolality and hyponatremia: Secondary | ICD-10-CM | POA: Diagnosis not present

## 2017-05-06 DIAGNOSIS — E7849 Other hyperlipidemia: Secondary | ICD-10-CM | POA: Diagnosis not present

## 2017-05-06 DIAGNOSIS — S0083XA Contusion of other part of head, initial encounter: Secondary | ICD-10-CM | POA: Diagnosis not present

## 2017-05-06 DIAGNOSIS — E1142 Type 2 diabetes mellitus with diabetic polyneuropathy: Secondary | ICD-10-CM | POA: Diagnosis not present

## 2017-05-06 DIAGNOSIS — M797 Fibromyalgia: Secondary | ICD-10-CM | POA: Diagnosis not present

## 2017-05-15 DIAGNOSIS — R2681 Unsteadiness on feet: Secondary | ICD-10-CM | POA: Diagnosis not present

## 2017-05-15 DIAGNOSIS — F3130 Bipolar disorder, current episode depressed, mild or moderate severity, unspecified: Secondary | ICD-10-CM | POA: Diagnosis not present

## 2017-05-15 DIAGNOSIS — M79641 Pain in right hand: Secondary | ICD-10-CM | POA: Diagnosis not present

## 2017-05-15 DIAGNOSIS — M797 Fibromyalgia: Secondary | ICD-10-CM | POA: Diagnosis not present

## 2017-05-15 DIAGNOSIS — E559 Vitamin D deficiency, unspecified: Secondary | ICD-10-CM | POA: Diagnosis not present

## 2017-05-15 DIAGNOSIS — J449 Chronic obstructive pulmonary disease, unspecified: Secondary | ICD-10-CM | POA: Diagnosis not present

## 2017-05-15 DIAGNOSIS — I951 Orthostatic hypotension: Secondary | ICD-10-CM | POA: Diagnosis not present

## 2017-05-15 DIAGNOSIS — F319 Bipolar disorder, unspecified: Secondary | ICD-10-CM | POA: Diagnosis not present

## 2017-05-15 DIAGNOSIS — I69354 Hemiplegia and hemiparesis following cerebral infarction affecting left non-dominant side: Secondary | ICD-10-CM | POA: Diagnosis not present

## 2017-05-15 DIAGNOSIS — R197 Diarrhea, unspecified: Secondary | ICD-10-CM | POA: Diagnosis not present

## 2017-05-15 DIAGNOSIS — R05 Cough: Secondary | ICD-10-CM | POA: Diagnosis not present

## 2017-05-15 DIAGNOSIS — R29898 Other symptoms and signs involving the musculoskeletal system: Secondary | ICD-10-CM | POA: Diagnosis not present

## 2017-05-15 DIAGNOSIS — S92902S Unspecified fracture of left foot, sequela: Secondary | ICD-10-CM | POA: Diagnosis not present

## 2017-05-15 DIAGNOSIS — E1142 Type 2 diabetes mellitus with diabetic polyneuropathy: Secondary | ICD-10-CM | POA: Diagnosis not present

## 2017-05-15 DIAGNOSIS — S0083XA Contusion of other part of head, initial encounter: Secondary | ICD-10-CM | POA: Diagnosis not present

## 2017-05-15 DIAGNOSIS — S92301D Fracture of unspecified metatarsal bone(s), right foot, subsequent encounter for fracture with routine healing: Secondary | ICD-10-CM | POA: Diagnosis not present

## 2017-05-15 DIAGNOSIS — S82892D Other fracture of left lower leg, subsequent encounter for closed fracture with routine healing: Secondary | ICD-10-CM | POA: Diagnosis not present

## 2017-05-15 DIAGNOSIS — R809 Proteinuria, unspecified: Secondary | ICD-10-CM | POA: Diagnosis not present

## 2017-05-15 DIAGNOSIS — S92902D Unspecified fracture of left foot, subsequent encounter for fracture with routine healing: Secondary | ICD-10-CM | POA: Diagnosis not present

## 2017-05-15 DIAGNOSIS — S92302D Fracture of unspecified metatarsal bone(s), left foot, subsequent encounter for fracture with routine healing: Secondary | ICD-10-CM | POA: Diagnosis not present

## 2017-05-15 DIAGNOSIS — E782 Mixed hyperlipidemia: Secondary | ICD-10-CM | POA: Diagnosis not present

## 2017-05-15 DIAGNOSIS — K219 Gastro-esophageal reflux disease without esophagitis: Secondary | ICD-10-CM | POA: Diagnosis not present

## 2017-05-15 DIAGNOSIS — E871 Hypo-osmolality and hyponatremia: Secondary | ICD-10-CM | POA: Diagnosis not present

## 2017-05-15 DIAGNOSIS — F419 Anxiety disorder, unspecified: Secondary | ICD-10-CM | POA: Diagnosis not present

## 2017-05-15 DIAGNOSIS — K5900 Constipation, unspecified: Secondary | ICD-10-CM | POA: Diagnosis not present

## 2017-05-15 DIAGNOSIS — K589 Irritable bowel syndrome without diarrhea: Secondary | ICD-10-CM | POA: Diagnosis not present

## 2017-05-15 DIAGNOSIS — F4310 Post-traumatic stress disorder, unspecified: Secondary | ICD-10-CM | POA: Diagnosis not present

## 2017-05-15 DIAGNOSIS — E119 Type 2 diabetes mellitus without complications: Secondary | ICD-10-CM | POA: Diagnosis not present

## 2017-05-15 DIAGNOSIS — I10 Essential (primary) hypertension: Secondary | ICD-10-CM | POA: Diagnosis not present

## 2017-05-15 DIAGNOSIS — R5381 Other malaise: Secondary | ICD-10-CM | POA: Diagnosis not present

## 2017-05-15 DIAGNOSIS — M6281 Muscle weakness (generalized): Secondary | ICD-10-CM | POA: Diagnosis not present

## 2017-05-15 DIAGNOSIS — M79672 Pain in left foot: Secondary | ICD-10-CM | POA: Diagnosis not present

## 2017-05-15 DIAGNOSIS — R531 Weakness: Secondary | ICD-10-CM | POA: Diagnosis not present

## 2017-05-15 DIAGNOSIS — R262 Difficulty in walking, not elsewhere classified: Secondary | ICD-10-CM | POA: Diagnosis not present

## 2017-05-16 DIAGNOSIS — E782 Mixed hyperlipidemia: Secondary | ICD-10-CM | POA: Diagnosis not present

## 2017-05-16 DIAGNOSIS — E119 Type 2 diabetes mellitus without complications: Secondary | ICD-10-CM | POA: Diagnosis not present

## 2017-05-16 DIAGNOSIS — K219 Gastro-esophageal reflux disease without esophagitis: Secondary | ICD-10-CM | POA: Diagnosis not present

## 2017-05-16 DIAGNOSIS — M6281 Muscle weakness (generalized): Secondary | ICD-10-CM | POA: Diagnosis not present

## 2017-05-16 DIAGNOSIS — I10 Essential (primary) hypertension: Secondary | ICD-10-CM | POA: Diagnosis not present

## 2017-05-16 DIAGNOSIS — F419 Anxiety disorder, unspecified: Secondary | ICD-10-CM | POA: Diagnosis not present

## 2017-05-16 DIAGNOSIS — R2681 Unsteadiness on feet: Secondary | ICD-10-CM | POA: Diagnosis not present

## 2017-05-16 DIAGNOSIS — S82892D Other fracture of left lower leg, subsequent encounter for closed fracture with routine healing: Secondary | ICD-10-CM | POA: Diagnosis not present

## 2017-05-20 DIAGNOSIS — M6281 Muscle weakness (generalized): Secondary | ICD-10-CM | POA: Diagnosis not present

## 2017-05-20 DIAGNOSIS — S82892D Other fracture of left lower leg, subsequent encounter for closed fracture with routine healing: Secondary | ICD-10-CM | POA: Diagnosis not present

## 2017-05-20 DIAGNOSIS — I10 Essential (primary) hypertension: Secondary | ICD-10-CM | POA: Diagnosis not present

## 2017-05-21 DIAGNOSIS — E782 Mixed hyperlipidemia: Secondary | ICD-10-CM | POA: Diagnosis not present

## 2017-05-21 DIAGNOSIS — M79641 Pain in right hand: Secondary | ICD-10-CM | POA: Diagnosis not present

## 2017-05-21 DIAGNOSIS — I10 Essential (primary) hypertension: Secondary | ICD-10-CM | POA: Diagnosis not present

## 2017-05-22 DIAGNOSIS — M6281 Muscle weakness (generalized): Secondary | ICD-10-CM | POA: Diagnosis not present

## 2017-05-22 DIAGNOSIS — E782 Mixed hyperlipidemia: Secondary | ICD-10-CM | POA: Diagnosis not present

## 2017-05-22 DIAGNOSIS — R2681 Unsteadiness on feet: Secondary | ICD-10-CM | POA: Diagnosis not present

## 2017-05-22 DIAGNOSIS — S82892D Other fracture of left lower leg, subsequent encounter for closed fracture with routine healing: Secondary | ICD-10-CM | POA: Diagnosis not present

## 2017-05-22 DIAGNOSIS — I10 Essential (primary) hypertension: Secondary | ICD-10-CM | POA: Diagnosis not present

## 2017-05-22 DIAGNOSIS — M79641 Pain in right hand: Secondary | ICD-10-CM | POA: Diagnosis not present

## 2017-05-26 DIAGNOSIS — F419 Anxiety disorder, unspecified: Secondary | ICD-10-CM | POA: Diagnosis not present

## 2017-05-26 DIAGNOSIS — E1142 Type 2 diabetes mellitus with diabetic polyneuropathy: Secondary | ICD-10-CM | POA: Diagnosis not present

## 2017-05-26 DIAGNOSIS — F319 Bipolar disorder, unspecified: Secondary | ICD-10-CM | POA: Diagnosis not present

## 2017-05-26 DIAGNOSIS — S92902S Unspecified fracture of left foot, sequela: Secondary | ICD-10-CM | POA: Diagnosis not present

## 2017-05-26 DIAGNOSIS — F4310 Post-traumatic stress disorder, unspecified: Secondary | ICD-10-CM | POA: Diagnosis not present

## 2017-05-27 DIAGNOSIS — E871 Hypo-osmolality and hyponatremia: Secondary | ICD-10-CM | POA: Diagnosis not present

## 2017-05-27 DIAGNOSIS — R197 Diarrhea, unspecified: Secondary | ICD-10-CM | POA: Diagnosis not present

## 2017-05-27 DIAGNOSIS — I10 Essential (primary) hypertension: Secondary | ICD-10-CM | POA: Diagnosis not present

## 2017-05-27 DIAGNOSIS — E559 Vitamin D deficiency, unspecified: Secondary | ICD-10-CM | POA: Diagnosis not present

## 2017-05-27 DIAGNOSIS — E782 Mixed hyperlipidemia: Secondary | ICD-10-CM | POA: Diagnosis not present

## 2017-05-27 DIAGNOSIS — M6281 Muscle weakness (generalized): Secondary | ICD-10-CM | POA: Diagnosis not present

## 2017-05-27 DIAGNOSIS — S92902D Unspecified fracture of left foot, subsequent encounter for fracture with routine healing: Secondary | ICD-10-CM | POA: Diagnosis not present

## 2017-05-27 DIAGNOSIS — E119 Type 2 diabetes mellitus without complications: Secondary | ICD-10-CM | POA: Diagnosis not present

## 2017-05-28 DIAGNOSIS — R197 Diarrhea, unspecified: Secondary | ICD-10-CM | POA: Diagnosis not present

## 2017-05-28 DIAGNOSIS — M797 Fibromyalgia: Secondary | ICD-10-CM | POA: Diagnosis not present

## 2017-05-28 DIAGNOSIS — E782 Mixed hyperlipidemia: Secondary | ICD-10-CM | POA: Diagnosis not present

## 2017-05-29 DIAGNOSIS — K589 Irritable bowel syndrome without diarrhea: Secondary | ICD-10-CM | POA: Diagnosis not present

## 2017-05-29 DIAGNOSIS — K5900 Constipation, unspecified: Secondary | ICD-10-CM | POA: Diagnosis not present

## 2017-06-03 DIAGNOSIS — E119 Type 2 diabetes mellitus without complications: Secondary | ICD-10-CM | POA: Diagnosis not present

## 2017-06-03 DIAGNOSIS — S92902D Unspecified fracture of left foot, subsequent encounter for fracture with routine healing: Secondary | ICD-10-CM | POA: Diagnosis not present

## 2017-06-03 DIAGNOSIS — I10 Essential (primary) hypertension: Secondary | ICD-10-CM | POA: Diagnosis not present

## 2017-06-03 DIAGNOSIS — M6281 Muscle weakness (generalized): Secondary | ICD-10-CM | POA: Diagnosis not present

## 2017-06-03 DIAGNOSIS — R197 Diarrhea, unspecified: Secondary | ICD-10-CM | POA: Diagnosis not present

## 2017-06-05 DIAGNOSIS — M6281 Muscle weakness (generalized): Secondary | ICD-10-CM | POA: Diagnosis not present

## 2017-06-05 DIAGNOSIS — S92902D Unspecified fracture of left foot, subsequent encounter for fracture with routine healing: Secondary | ICD-10-CM | POA: Diagnosis not present

## 2017-06-05 DIAGNOSIS — J449 Chronic obstructive pulmonary disease, unspecified: Secondary | ICD-10-CM | POA: Diagnosis not present

## 2017-06-05 DIAGNOSIS — E559 Vitamin D deficiency, unspecified: Secondary | ICD-10-CM | POA: Diagnosis not present

## 2017-06-05 DIAGNOSIS — I10 Essential (primary) hypertension: Secondary | ICD-10-CM | POA: Diagnosis not present

## 2017-06-05 DIAGNOSIS — E119 Type 2 diabetes mellitus without complications: Secondary | ICD-10-CM | POA: Diagnosis not present

## 2017-06-10 DIAGNOSIS — M6281 Muscle weakness (generalized): Secondary | ICD-10-CM | POA: Diagnosis not present

## 2017-06-10 DIAGNOSIS — E119 Type 2 diabetes mellitus without complications: Secondary | ICD-10-CM | POA: Diagnosis not present

## 2017-06-10 DIAGNOSIS — I10 Essential (primary) hypertension: Secondary | ICD-10-CM | POA: Diagnosis not present

## 2017-06-10 DIAGNOSIS — R05 Cough: Secondary | ICD-10-CM | POA: Diagnosis not present

## 2017-06-10 DIAGNOSIS — S92902S Unspecified fracture of left foot, sequela: Secondary | ICD-10-CM | POA: Diagnosis not present

## 2017-06-10 DIAGNOSIS — E1142 Type 2 diabetes mellitus with diabetic polyneuropathy: Secondary | ICD-10-CM | POA: Diagnosis not present

## 2017-06-10 DIAGNOSIS — S92902D Unspecified fracture of left foot, subsequent encounter for fracture with routine healing: Secondary | ICD-10-CM | POA: Diagnosis not present

## 2017-06-11 DIAGNOSIS — E782 Mixed hyperlipidemia: Secondary | ICD-10-CM | POA: Diagnosis not present

## 2017-06-11 DIAGNOSIS — K219 Gastro-esophageal reflux disease without esophagitis: Secondary | ICD-10-CM | POA: Diagnosis not present

## 2017-06-11 DIAGNOSIS — M79672 Pain in left foot: Secondary | ICD-10-CM | POA: Diagnosis not present

## 2017-06-12 DIAGNOSIS — I951 Orthostatic hypotension: Secondary | ICD-10-CM | POA: Diagnosis not present

## 2017-06-12 DIAGNOSIS — R197 Diarrhea, unspecified: Secondary | ICD-10-CM | POA: Diagnosis not present

## 2017-06-12 DIAGNOSIS — M6281 Muscle weakness (generalized): Secondary | ICD-10-CM | POA: Diagnosis not present

## 2017-06-12 DIAGNOSIS — I10 Essential (primary) hypertension: Secondary | ICD-10-CM | POA: Diagnosis not present

## 2017-06-12 DIAGNOSIS — S92902D Unspecified fracture of left foot, subsequent encounter for fracture with routine healing: Secondary | ICD-10-CM | POA: Diagnosis not present

## 2017-06-12 DIAGNOSIS — R2681 Unsteadiness on feet: Secondary | ICD-10-CM | POA: Diagnosis not present

## 2017-06-12 DIAGNOSIS — F419 Anxiety disorder, unspecified: Secondary | ICD-10-CM | POA: Diagnosis not present

## 2017-06-17 DIAGNOSIS — J449 Chronic obstructive pulmonary disease, unspecified: Secondary | ICD-10-CM | POA: Diagnosis not present

## 2017-06-17 DIAGNOSIS — M6281 Muscle weakness (generalized): Secondary | ICD-10-CM | POA: Diagnosis not present

## 2017-06-17 DIAGNOSIS — S92902D Unspecified fracture of left foot, subsequent encounter for fracture with routine healing: Secondary | ICD-10-CM | POA: Diagnosis not present

## 2017-06-17 DIAGNOSIS — F419 Anxiety disorder, unspecified: Secondary | ICD-10-CM | POA: Diagnosis not present

## 2017-06-17 DIAGNOSIS — F3130 Bipolar disorder, current episode depressed, mild or moderate severity, unspecified: Secondary | ICD-10-CM | POA: Diagnosis not present

## 2017-06-17 DIAGNOSIS — E119 Type 2 diabetes mellitus without complications: Secondary | ICD-10-CM | POA: Diagnosis not present

## 2017-06-17 DIAGNOSIS — I10 Essential (primary) hypertension: Secondary | ICD-10-CM | POA: Diagnosis not present

## 2017-06-18 DIAGNOSIS — K219 Gastro-esophageal reflux disease without esophagitis: Secondary | ICD-10-CM | POA: Diagnosis not present

## 2017-06-18 DIAGNOSIS — I10 Essential (primary) hypertension: Secondary | ICD-10-CM | POA: Diagnosis not present

## 2017-06-18 DIAGNOSIS — E119 Type 2 diabetes mellitus without complications: Secondary | ICD-10-CM | POA: Diagnosis not present

## 2017-06-24 DIAGNOSIS — I10 Essential (primary) hypertension: Secondary | ICD-10-CM | POA: Diagnosis not present

## 2017-06-24 DIAGNOSIS — S92902D Unspecified fracture of left foot, subsequent encounter for fracture with routine healing: Secondary | ICD-10-CM | POA: Diagnosis not present

## 2017-06-24 DIAGNOSIS — M545 Low back pain: Secondary | ICD-10-CM | POA: Diagnosis not present

## 2017-06-24 DIAGNOSIS — Z043 Encounter for examination and observation following other accident: Secondary | ICD-10-CM | POA: Diagnosis not present

## 2017-06-24 DIAGNOSIS — M797 Fibromyalgia: Secondary | ICD-10-CM | POA: Diagnosis not present

## 2017-06-24 DIAGNOSIS — S92322S Displaced fracture of second metatarsal bone, left foot, sequela: Secondary | ICD-10-CM | POA: Diagnosis not present

## 2017-06-24 DIAGNOSIS — E119 Type 2 diabetes mellitus without complications: Secondary | ICD-10-CM | POA: Diagnosis not present

## 2017-06-24 DIAGNOSIS — J449 Chronic obstructive pulmonary disease, unspecified: Secondary | ICD-10-CM | POA: Diagnosis not present

## 2017-06-24 DIAGNOSIS — Z8673 Personal history of transient ischemic attack (TIA), and cerebral infarction without residual deficits: Secondary | ICD-10-CM | POA: Diagnosis not present

## 2017-06-24 DIAGNOSIS — Z8572 Personal history of non-Hodgkin lymphomas: Secondary | ICD-10-CM | POA: Diagnosis not present

## 2017-06-24 DIAGNOSIS — Z87440 Personal history of urinary (tract) infections: Secondary | ICD-10-CM | POA: Diagnosis not present

## 2017-06-24 DIAGNOSIS — E118 Type 2 diabetes mellitus with unspecified complications: Secondary | ICD-10-CM | POA: Diagnosis not present

## 2017-06-24 DIAGNOSIS — F319 Bipolar disorder, unspecified: Secondary | ICD-10-CM | POA: Diagnosis not present

## 2017-06-25 DIAGNOSIS — C8190 Hodgkin lymphoma, unspecified, unspecified site: Secondary | ICD-10-CM | POA: Diagnosis not present

## 2017-06-25 DIAGNOSIS — M797 Fibromyalgia: Secondary | ICD-10-CM | POA: Diagnosis not present

## 2017-06-25 DIAGNOSIS — G894 Chronic pain syndrome: Secondary | ICD-10-CM | POA: Diagnosis not present

## 2017-06-25 DIAGNOSIS — I10 Essential (primary) hypertension: Secondary | ICD-10-CM | POA: Diagnosis not present

## 2017-06-25 DIAGNOSIS — R5383 Other fatigue: Secondary | ICD-10-CM | POA: Diagnosis not present

## 2017-06-25 DIAGNOSIS — E1169 Type 2 diabetes mellitus with other specified complication: Secondary | ICD-10-CM | POA: Diagnosis not present

## 2017-06-26 DIAGNOSIS — I10 Essential (primary) hypertension: Secondary | ICD-10-CM | POA: Diagnosis not present

## 2017-06-26 DIAGNOSIS — M545 Low back pain: Secondary | ICD-10-CM | POA: Diagnosis not present

## 2017-06-26 DIAGNOSIS — J449 Chronic obstructive pulmonary disease, unspecified: Secondary | ICD-10-CM | POA: Diagnosis not present

## 2017-06-26 DIAGNOSIS — E119 Type 2 diabetes mellitus without complications: Secondary | ICD-10-CM | POA: Diagnosis not present

## 2017-06-26 DIAGNOSIS — S92902D Unspecified fracture of left foot, subsequent encounter for fracture with routine healing: Secondary | ICD-10-CM | POA: Diagnosis not present

## 2017-06-26 DIAGNOSIS — M797 Fibromyalgia: Secondary | ICD-10-CM | POA: Diagnosis not present

## 2017-06-28 DIAGNOSIS — M545 Low back pain: Secondary | ICD-10-CM | POA: Diagnosis not present

## 2017-06-28 DIAGNOSIS — J449 Chronic obstructive pulmonary disease, unspecified: Secondary | ICD-10-CM | POA: Diagnosis not present

## 2017-06-28 DIAGNOSIS — E119 Type 2 diabetes mellitus without complications: Secondary | ICD-10-CM | POA: Diagnosis not present

## 2017-06-28 DIAGNOSIS — I10 Essential (primary) hypertension: Secondary | ICD-10-CM | POA: Diagnosis not present

## 2017-06-28 DIAGNOSIS — S92902D Unspecified fracture of left foot, subsequent encounter for fracture with routine healing: Secondary | ICD-10-CM | POA: Diagnosis not present

## 2017-06-28 DIAGNOSIS — M797 Fibromyalgia: Secondary | ICD-10-CM | POA: Diagnosis not present

## 2017-06-30 DIAGNOSIS — E119 Type 2 diabetes mellitus without complications: Secondary | ICD-10-CM | POA: Diagnosis not present

## 2017-06-30 DIAGNOSIS — I10 Essential (primary) hypertension: Secondary | ICD-10-CM | POA: Diagnosis not present

## 2017-06-30 DIAGNOSIS — M797 Fibromyalgia: Secondary | ICD-10-CM | POA: Diagnosis not present

## 2017-06-30 DIAGNOSIS — M545 Low back pain: Secondary | ICD-10-CM | POA: Diagnosis not present

## 2017-06-30 DIAGNOSIS — S92902D Unspecified fracture of left foot, subsequent encounter for fracture with routine healing: Secondary | ICD-10-CM | POA: Diagnosis not present

## 2017-06-30 DIAGNOSIS — J449 Chronic obstructive pulmonary disease, unspecified: Secondary | ICD-10-CM | POA: Diagnosis not present

## 2017-07-01 DIAGNOSIS — J449 Chronic obstructive pulmonary disease, unspecified: Secondary | ICD-10-CM | POA: Diagnosis not present

## 2017-07-01 DIAGNOSIS — E1142 Type 2 diabetes mellitus with diabetic polyneuropathy: Secondary | ICD-10-CM | POA: Diagnosis not present

## 2017-07-01 DIAGNOSIS — M797 Fibromyalgia: Secondary | ICD-10-CM | POA: Diagnosis not present

## 2017-07-01 DIAGNOSIS — E119 Type 2 diabetes mellitus without complications: Secondary | ICD-10-CM | POA: Diagnosis not present

## 2017-07-01 DIAGNOSIS — S92902S Unspecified fracture of left foot, sequela: Secondary | ICD-10-CM | POA: Diagnosis not present

## 2017-07-01 DIAGNOSIS — M545 Low back pain: Secondary | ICD-10-CM | POA: Diagnosis not present

## 2017-07-01 DIAGNOSIS — B351 Tinea unguium: Secondary | ICD-10-CM | POA: Diagnosis not present

## 2017-07-01 DIAGNOSIS — I10 Essential (primary) hypertension: Secondary | ICD-10-CM | POA: Diagnosis not present

## 2017-07-01 DIAGNOSIS — S92902D Unspecified fracture of left foot, subsequent encounter for fracture with routine healing: Secondary | ICD-10-CM | POA: Diagnosis not present

## 2017-07-04 DIAGNOSIS — E119 Type 2 diabetes mellitus without complications: Secondary | ICD-10-CM | POA: Diagnosis not present

## 2017-07-04 DIAGNOSIS — S92902D Unspecified fracture of left foot, subsequent encounter for fracture with routine healing: Secondary | ICD-10-CM | POA: Diagnosis not present

## 2017-07-04 DIAGNOSIS — M797 Fibromyalgia: Secondary | ICD-10-CM | POA: Diagnosis not present

## 2017-07-04 DIAGNOSIS — J449 Chronic obstructive pulmonary disease, unspecified: Secondary | ICD-10-CM | POA: Diagnosis not present

## 2017-07-04 DIAGNOSIS — M545 Low back pain: Secondary | ICD-10-CM | POA: Diagnosis not present

## 2017-07-04 DIAGNOSIS — I10 Essential (primary) hypertension: Secondary | ICD-10-CM | POA: Diagnosis not present

## 2017-07-08 DIAGNOSIS — S92902D Unspecified fracture of left foot, subsequent encounter for fracture with routine healing: Secondary | ICD-10-CM | POA: Diagnosis not present

## 2017-07-08 DIAGNOSIS — M545 Low back pain: Secondary | ICD-10-CM | POA: Diagnosis not present

## 2017-07-08 DIAGNOSIS — E119 Type 2 diabetes mellitus without complications: Secondary | ICD-10-CM | POA: Diagnosis not present

## 2017-07-08 DIAGNOSIS — I10 Essential (primary) hypertension: Secondary | ICD-10-CM | POA: Diagnosis not present

## 2017-07-08 DIAGNOSIS — J449 Chronic obstructive pulmonary disease, unspecified: Secondary | ICD-10-CM | POA: Diagnosis not present

## 2017-07-08 DIAGNOSIS — M797 Fibromyalgia: Secondary | ICD-10-CM | POA: Diagnosis not present

## 2017-07-11 DIAGNOSIS — M545 Low back pain: Secondary | ICD-10-CM | POA: Diagnosis not present

## 2017-07-11 DIAGNOSIS — E119 Type 2 diabetes mellitus without complications: Secondary | ICD-10-CM | POA: Diagnosis not present

## 2017-07-11 DIAGNOSIS — S92902D Unspecified fracture of left foot, subsequent encounter for fracture with routine healing: Secondary | ICD-10-CM | POA: Diagnosis not present

## 2017-07-11 DIAGNOSIS — I10 Essential (primary) hypertension: Secondary | ICD-10-CM | POA: Diagnosis not present

## 2017-07-11 DIAGNOSIS — M797 Fibromyalgia: Secondary | ICD-10-CM | POA: Diagnosis not present

## 2017-07-11 DIAGNOSIS — J449 Chronic obstructive pulmonary disease, unspecified: Secondary | ICD-10-CM | POA: Diagnosis not present

## 2017-07-14 DIAGNOSIS — M25872 Other specified joint disorders, left ankle and foot: Secondary | ICD-10-CM | POA: Diagnosis not present

## 2017-07-15 DIAGNOSIS — B351 Tinea unguium: Secondary | ICD-10-CM | POA: Diagnosis not present

## 2017-07-15 DIAGNOSIS — S92902S Unspecified fracture of left foot, sequela: Secondary | ICD-10-CM | POA: Diagnosis not present

## 2017-07-15 DIAGNOSIS — E1142 Type 2 diabetes mellitus with diabetic polyneuropathy: Secondary | ICD-10-CM | POA: Diagnosis not present

## 2017-07-16 DIAGNOSIS — M545 Low back pain: Secondary | ICD-10-CM | POA: Diagnosis not present

## 2017-07-16 DIAGNOSIS — J449 Chronic obstructive pulmonary disease, unspecified: Secondary | ICD-10-CM | POA: Diagnosis not present

## 2017-07-16 DIAGNOSIS — M797 Fibromyalgia: Secondary | ICD-10-CM | POA: Diagnosis not present

## 2017-07-16 DIAGNOSIS — E119 Type 2 diabetes mellitus without complications: Secondary | ICD-10-CM | POA: Diagnosis not present

## 2017-07-16 DIAGNOSIS — I10 Essential (primary) hypertension: Secondary | ICD-10-CM | POA: Diagnosis not present

## 2017-07-16 DIAGNOSIS — S92902D Unspecified fracture of left foot, subsequent encounter for fracture with routine healing: Secondary | ICD-10-CM | POA: Diagnosis not present

## 2017-07-17 DIAGNOSIS — M797 Fibromyalgia: Secondary | ICD-10-CM | POA: Diagnosis not present

## 2017-07-17 DIAGNOSIS — E119 Type 2 diabetes mellitus without complications: Secondary | ICD-10-CM | POA: Diagnosis not present

## 2017-07-17 DIAGNOSIS — I10 Essential (primary) hypertension: Secondary | ICD-10-CM | POA: Diagnosis not present

## 2017-07-17 DIAGNOSIS — J449 Chronic obstructive pulmonary disease, unspecified: Secondary | ICD-10-CM | POA: Diagnosis not present

## 2017-07-17 DIAGNOSIS — S92902D Unspecified fracture of left foot, subsequent encounter for fracture with routine healing: Secondary | ICD-10-CM | POA: Diagnosis not present

## 2017-07-17 DIAGNOSIS — M545 Low back pain: Secondary | ICD-10-CM | POA: Diagnosis not present

## 2017-07-19 DIAGNOSIS — M797 Fibromyalgia: Secondary | ICD-10-CM | POA: Diagnosis not present

## 2017-07-19 DIAGNOSIS — M545 Low back pain: Secondary | ICD-10-CM | POA: Diagnosis not present

## 2017-07-19 DIAGNOSIS — I10 Essential (primary) hypertension: Secondary | ICD-10-CM | POA: Diagnosis not present

## 2017-07-19 DIAGNOSIS — S92902D Unspecified fracture of left foot, subsequent encounter for fracture with routine healing: Secondary | ICD-10-CM | POA: Diagnosis not present

## 2017-07-19 DIAGNOSIS — E119 Type 2 diabetes mellitus without complications: Secondary | ICD-10-CM | POA: Diagnosis not present

## 2017-07-19 DIAGNOSIS — J449 Chronic obstructive pulmonary disease, unspecified: Secondary | ICD-10-CM | POA: Diagnosis not present

## 2017-07-21 DIAGNOSIS — S92902D Unspecified fracture of left foot, subsequent encounter for fracture with routine healing: Secondary | ICD-10-CM | POA: Diagnosis not present

## 2017-07-21 DIAGNOSIS — E119 Type 2 diabetes mellitus without complications: Secondary | ICD-10-CM | POA: Diagnosis not present

## 2017-07-21 DIAGNOSIS — J449 Chronic obstructive pulmonary disease, unspecified: Secondary | ICD-10-CM | POA: Diagnosis not present

## 2017-07-21 DIAGNOSIS — M545 Low back pain: Secondary | ICD-10-CM | POA: Diagnosis not present

## 2017-07-21 DIAGNOSIS — I10 Essential (primary) hypertension: Secondary | ICD-10-CM | POA: Diagnosis not present

## 2017-07-21 DIAGNOSIS — M797 Fibromyalgia: Secondary | ICD-10-CM | POA: Diagnosis not present

## 2017-07-23 DIAGNOSIS — E875 Hyperkalemia: Secondary | ICD-10-CM | POA: Diagnosis not present

## 2017-07-23 DIAGNOSIS — E785 Hyperlipidemia, unspecified: Secondary | ICD-10-CM | POA: Diagnosis not present

## 2017-07-23 DIAGNOSIS — R531 Weakness: Secondary | ICD-10-CM | POA: Diagnosis not present

## 2017-07-23 DIAGNOSIS — N200 Calculus of kidney: Secondary | ICD-10-CM | POA: Diagnosis not present

## 2017-07-23 DIAGNOSIS — I10 Essential (primary) hypertension: Secondary | ICD-10-CM | POA: Diagnosis not present

## 2017-07-24 DIAGNOSIS — E612 Magnesium deficiency: Secondary | ICD-10-CM | POA: Diagnosis not present

## 2017-07-24 DIAGNOSIS — K219 Gastro-esophageal reflux disease without esophagitis: Secondary | ICD-10-CM | POA: Diagnosis not present

## 2017-07-24 DIAGNOSIS — I10 Essential (primary) hypertension: Secondary | ICD-10-CM | POA: Diagnosis not present

## 2017-07-24 DIAGNOSIS — M797 Fibromyalgia: Secondary | ICD-10-CM | POA: Diagnosis not present

## 2017-07-24 DIAGNOSIS — J449 Chronic obstructive pulmonary disease, unspecified: Secondary | ICD-10-CM | POA: Diagnosis not present

## 2017-07-24 DIAGNOSIS — C8190 Hodgkin lymphoma, unspecified, unspecified site: Secondary | ICD-10-CM | POA: Diagnosis not present

## 2017-07-24 DIAGNOSIS — E119 Type 2 diabetes mellitus without complications: Secondary | ICD-10-CM | POA: Diagnosis not present

## 2017-07-24 DIAGNOSIS — E876 Hypokalemia: Secondary | ICD-10-CM | POA: Diagnosis not present

## 2017-07-24 DIAGNOSIS — Z1231 Encounter for screening mammogram for malignant neoplasm of breast: Secondary | ICD-10-CM | POA: Diagnosis not present

## 2017-07-24 DIAGNOSIS — E1169 Type 2 diabetes mellitus with other specified complication: Secondary | ICD-10-CM | POA: Diagnosis not present

## 2017-07-24 DIAGNOSIS — S92902D Unspecified fracture of left foot, subsequent encounter for fracture with routine healing: Secondary | ICD-10-CM | POA: Diagnosis not present

## 2017-07-24 DIAGNOSIS — R42 Dizziness and giddiness: Secondary | ICD-10-CM | POA: Diagnosis not present

## 2017-07-24 DIAGNOSIS — M545 Low back pain: Secondary | ICD-10-CM | POA: Diagnosis not present

## 2017-07-25 DIAGNOSIS — M545 Low back pain: Secondary | ICD-10-CM | POA: Diagnosis not present

## 2017-07-25 DIAGNOSIS — I10 Essential (primary) hypertension: Secondary | ICD-10-CM | POA: Diagnosis not present

## 2017-07-25 DIAGNOSIS — J449 Chronic obstructive pulmonary disease, unspecified: Secondary | ICD-10-CM | POA: Diagnosis not present

## 2017-07-25 DIAGNOSIS — E119 Type 2 diabetes mellitus without complications: Secondary | ICD-10-CM | POA: Diagnosis not present

## 2017-07-25 DIAGNOSIS — M797 Fibromyalgia: Secondary | ICD-10-CM | POA: Diagnosis not present

## 2017-07-25 DIAGNOSIS — S92902D Unspecified fracture of left foot, subsequent encounter for fracture with routine healing: Secondary | ICD-10-CM | POA: Diagnosis not present

## 2017-07-29 DIAGNOSIS — I639 Cerebral infarction, unspecified: Secondary | ICD-10-CM

## 2017-07-29 HISTORY — DX: Cerebral infarction, unspecified: I63.9

## 2017-07-31 DIAGNOSIS — E119 Type 2 diabetes mellitus without complications: Secondary | ICD-10-CM | POA: Diagnosis not present

## 2017-07-31 DIAGNOSIS — J449 Chronic obstructive pulmonary disease, unspecified: Secondary | ICD-10-CM | POA: Diagnosis not present

## 2017-07-31 DIAGNOSIS — M545 Low back pain: Secondary | ICD-10-CM | POA: Diagnosis not present

## 2017-07-31 DIAGNOSIS — E538 Deficiency of other specified B group vitamins: Secondary | ICD-10-CM | POA: Diagnosis not present

## 2017-07-31 DIAGNOSIS — I10 Essential (primary) hypertension: Secondary | ICD-10-CM | POA: Diagnosis not present

## 2017-07-31 DIAGNOSIS — S92902D Unspecified fracture of left foot, subsequent encounter for fracture with routine healing: Secondary | ICD-10-CM | POA: Diagnosis not present

## 2017-07-31 DIAGNOSIS — M797 Fibromyalgia: Secondary | ICD-10-CM | POA: Diagnosis not present

## 2017-08-01 DIAGNOSIS — S92902D Unspecified fracture of left foot, subsequent encounter for fracture with routine healing: Secondary | ICD-10-CM | POA: Diagnosis not present

## 2017-08-01 DIAGNOSIS — E119 Type 2 diabetes mellitus without complications: Secondary | ICD-10-CM | POA: Diagnosis not present

## 2017-08-01 DIAGNOSIS — I10 Essential (primary) hypertension: Secondary | ICD-10-CM | POA: Diagnosis not present

## 2017-08-01 DIAGNOSIS — M545 Low back pain: Secondary | ICD-10-CM | POA: Diagnosis not present

## 2017-08-01 DIAGNOSIS — J449 Chronic obstructive pulmonary disease, unspecified: Secondary | ICD-10-CM | POA: Diagnosis not present

## 2017-08-01 DIAGNOSIS — M797 Fibromyalgia: Secondary | ICD-10-CM | POA: Diagnosis not present

## 2017-08-04 DIAGNOSIS — N3281 Overactive bladder: Secondary | ICD-10-CM | POA: Diagnosis not present

## 2017-08-04 DIAGNOSIS — R3914 Feeling of incomplete bladder emptying: Secondary | ICD-10-CM | POA: Diagnosis not present

## 2017-08-04 DIAGNOSIS — N200 Calculus of kidney: Secondary | ICD-10-CM | POA: Diagnosis not present

## 2017-08-04 DIAGNOSIS — N390 Urinary tract infection, site not specified: Secondary | ICD-10-CM | POA: Diagnosis not present

## 2017-08-05 DIAGNOSIS — E119 Type 2 diabetes mellitus without complications: Secondary | ICD-10-CM | POA: Diagnosis not present

## 2017-08-05 DIAGNOSIS — J449 Chronic obstructive pulmonary disease, unspecified: Secondary | ICD-10-CM | POA: Diagnosis not present

## 2017-08-05 DIAGNOSIS — I10 Essential (primary) hypertension: Secondary | ICD-10-CM | POA: Diagnosis not present

## 2017-08-05 DIAGNOSIS — M545 Low back pain: Secondary | ICD-10-CM | POA: Diagnosis not present

## 2017-08-05 DIAGNOSIS — E1165 Type 2 diabetes mellitus with hyperglycemia: Secondary | ICD-10-CM | POA: Diagnosis not present

## 2017-08-05 DIAGNOSIS — S92902D Unspecified fracture of left foot, subsequent encounter for fracture with routine healing: Secondary | ICD-10-CM | POA: Diagnosis not present

## 2017-08-05 DIAGNOSIS — M797 Fibromyalgia: Secondary | ICD-10-CM | POA: Diagnosis not present

## 2017-08-07 DIAGNOSIS — J449 Chronic obstructive pulmonary disease, unspecified: Secondary | ICD-10-CM | POA: Diagnosis not present

## 2017-08-07 DIAGNOSIS — M545 Low back pain: Secondary | ICD-10-CM | POA: Diagnosis not present

## 2017-08-07 DIAGNOSIS — S92902D Unspecified fracture of left foot, subsequent encounter for fracture with routine healing: Secondary | ICD-10-CM | POA: Diagnosis not present

## 2017-08-07 DIAGNOSIS — M797 Fibromyalgia: Secondary | ICD-10-CM | POA: Diagnosis not present

## 2017-08-07 DIAGNOSIS — I10 Essential (primary) hypertension: Secondary | ICD-10-CM | POA: Diagnosis not present

## 2017-08-07 DIAGNOSIS — E119 Type 2 diabetes mellitus without complications: Secondary | ICD-10-CM | POA: Diagnosis not present

## 2017-08-11 DIAGNOSIS — I10 Essential (primary) hypertension: Secondary | ICD-10-CM | POA: Diagnosis not present

## 2017-08-11 DIAGNOSIS — E7800 Pure hypercholesterolemia, unspecified: Secondary | ICD-10-CM | POA: Diagnosis not present

## 2017-08-11 DIAGNOSIS — R809 Proteinuria, unspecified: Secondary | ICD-10-CM | POA: Diagnosis not present

## 2017-08-11 DIAGNOSIS — E871 Hypo-osmolality and hyponatremia: Secondary | ICD-10-CM | POA: Diagnosis not present

## 2017-08-11 DIAGNOSIS — E1165 Type 2 diabetes mellitus with hyperglycemia: Secondary | ICD-10-CM | POA: Diagnosis not present

## 2017-08-11 DIAGNOSIS — N182 Chronic kidney disease, stage 2 (mild): Secondary | ICD-10-CM | POA: Diagnosis not present

## 2017-08-11 DIAGNOSIS — E559 Vitamin D deficiency, unspecified: Secondary | ICD-10-CM | POA: Diagnosis not present

## 2017-08-12 DIAGNOSIS — E119 Type 2 diabetes mellitus without complications: Secondary | ICD-10-CM | POA: Diagnosis not present

## 2017-08-12 DIAGNOSIS — I10 Essential (primary) hypertension: Secondary | ICD-10-CM | POA: Diagnosis not present

## 2017-08-12 DIAGNOSIS — B351 Tinea unguium: Secondary | ICD-10-CM | POA: Diagnosis not present

## 2017-08-12 DIAGNOSIS — S92902S Unspecified fracture of left foot, sequela: Secondary | ICD-10-CM | POA: Diagnosis not present

## 2017-08-12 DIAGNOSIS — S92902D Unspecified fracture of left foot, subsequent encounter for fracture with routine healing: Secondary | ICD-10-CM | POA: Diagnosis not present

## 2017-08-12 DIAGNOSIS — M545 Low back pain: Secondary | ICD-10-CM | POA: Diagnosis not present

## 2017-08-12 DIAGNOSIS — E1142 Type 2 diabetes mellitus with diabetic polyneuropathy: Secondary | ICD-10-CM | POA: Diagnosis not present

## 2017-08-12 DIAGNOSIS — Z780 Asymptomatic menopausal state: Secondary | ICD-10-CM | POA: Diagnosis not present

## 2017-08-12 DIAGNOSIS — J449 Chronic obstructive pulmonary disease, unspecified: Secondary | ICD-10-CM | POA: Diagnosis not present

## 2017-08-12 DIAGNOSIS — Z13820 Encounter for screening for osteoporosis: Secondary | ICD-10-CM | POA: Diagnosis not present

## 2017-08-12 DIAGNOSIS — M797 Fibromyalgia: Secondary | ICD-10-CM | POA: Diagnosis not present

## 2017-08-13 DIAGNOSIS — M4802 Spinal stenosis, cervical region: Secondary | ICD-10-CM | POA: Diagnosis not present

## 2017-08-13 DIAGNOSIS — M545 Low back pain: Secondary | ICD-10-CM | POA: Diagnosis not present

## 2017-08-13 DIAGNOSIS — R202 Paresthesia of skin: Secondary | ICD-10-CM | POA: Diagnosis not present

## 2017-08-13 DIAGNOSIS — M48061 Spinal stenosis, lumbar region without neurogenic claudication: Secondary | ICD-10-CM | POA: Diagnosis not present

## 2017-08-13 DIAGNOSIS — M5127 Other intervertebral disc displacement, lumbosacral region: Secondary | ICD-10-CM | POA: Diagnosis not present

## 2017-08-13 DIAGNOSIS — R269 Unspecified abnormalities of gait and mobility: Secondary | ICD-10-CM | POA: Diagnosis not present

## 2017-08-13 DIAGNOSIS — M5124 Other intervertebral disc displacement, thoracic region: Secondary | ICD-10-CM | POA: Diagnosis not present

## 2017-08-13 DIAGNOSIS — M797 Fibromyalgia: Secondary | ICD-10-CM | POA: Diagnosis not present

## 2017-08-13 DIAGNOSIS — S92902D Unspecified fracture of left foot, subsequent encounter for fracture with routine healing: Secondary | ICD-10-CM | POA: Diagnosis not present

## 2017-08-13 DIAGNOSIS — I10 Essential (primary) hypertension: Secondary | ICD-10-CM | POA: Diagnosis not present

## 2017-08-13 DIAGNOSIS — M5416 Radiculopathy, lumbar region: Secondary | ICD-10-CM | POA: Diagnosis not present

## 2017-08-13 DIAGNOSIS — E119 Type 2 diabetes mellitus without complications: Secondary | ICD-10-CM | POA: Diagnosis not present

## 2017-08-13 DIAGNOSIS — G5622 Lesion of ulnar nerve, left upper limb: Secondary | ICD-10-CM | POA: Diagnosis not present

## 2017-08-13 DIAGNOSIS — J449 Chronic obstructive pulmonary disease, unspecified: Secondary | ICD-10-CM | POA: Diagnosis not present

## 2017-08-13 DIAGNOSIS — G5603 Carpal tunnel syndrome, bilateral upper limbs: Secondary | ICD-10-CM | POA: Diagnosis not present

## 2017-08-13 DIAGNOSIS — M5184 Other intervertebral disc disorders, thoracic region: Secondary | ICD-10-CM | POA: Diagnosis not present

## 2017-08-13 DIAGNOSIS — M5126 Other intervertebral disc displacement, lumbar region: Secondary | ICD-10-CM | POA: Diagnosis not present

## 2017-08-13 DIAGNOSIS — M2578 Osteophyte, vertebrae: Secondary | ICD-10-CM | POA: Diagnosis not present

## 2017-08-18 DIAGNOSIS — M5416 Radiculopathy, lumbar region: Secondary | ICD-10-CM | POA: Diagnosis not present

## 2017-08-18 DIAGNOSIS — R202 Paresthesia of skin: Secondary | ICD-10-CM | POA: Diagnosis not present

## 2017-08-18 DIAGNOSIS — G5622 Lesion of ulnar nerve, left upper limb: Secondary | ICD-10-CM | POA: Diagnosis not present

## 2017-08-18 DIAGNOSIS — R269 Unspecified abnormalities of gait and mobility: Secondary | ICD-10-CM | POA: Diagnosis not present

## 2017-08-18 DIAGNOSIS — M4802 Spinal stenosis, cervical region: Secondary | ICD-10-CM | POA: Diagnosis not present

## 2017-08-18 DIAGNOSIS — G5603 Carpal tunnel syndrome, bilateral upper limbs: Secondary | ICD-10-CM | POA: Diagnosis not present

## 2017-08-19 DIAGNOSIS — I10 Essential (primary) hypertension: Secondary | ICD-10-CM | POA: Diagnosis not present

## 2017-08-19 DIAGNOSIS — S92902D Unspecified fracture of left foot, subsequent encounter for fracture with routine healing: Secondary | ICD-10-CM | POA: Diagnosis not present

## 2017-08-19 DIAGNOSIS — E119 Type 2 diabetes mellitus without complications: Secondary | ICD-10-CM | POA: Diagnosis not present

## 2017-08-19 DIAGNOSIS — J449 Chronic obstructive pulmonary disease, unspecified: Secondary | ICD-10-CM | POA: Diagnosis not present

## 2017-08-19 DIAGNOSIS — M545 Low back pain: Secondary | ICD-10-CM | POA: Diagnosis not present

## 2017-08-19 DIAGNOSIS — M797 Fibromyalgia: Secondary | ICD-10-CM | POA: Diagnosis not present

## 2017-08-22 DIAGNOSIS — M5116 Intervertebral disc disorders with radiculopathy, lumbar region: Secondary | ICD-10-CM | POA: Diagnosis not present

## 2017-08-22 DIAGNOSIS — I10 Essential (primary) hypertension: Secondary | ICD-10-CM | POA: Diagnosis not present

## 2017-08-22 DIAGNOSIS — E871 Hypo-osmolality and hyponatremia: Secondary | ICD-10-CM | POA: Diagnosis not present

## 2017-08-22 DIAGNOSIS — E119 Type 2 diabetes mellitus without complications: Secondary | ICD-10-CM | POA: Diagnosis not present

## 2017-08-22 DIAGNOSIS — R809 Proteinuria, unspecified: Secondary | ICD-10-CM | POA: Diagnosis not present

## 2017-08-25 DIAGNOSIS — M5416 Radiculopathy, lumbar region: Secondary | ICD-10-CM | POA: Diagnosis present

## 2017-08-25 DIAGNOSIS — R531 Weakness: Secondary | ICD-10-CM | POA: Diagnosis not present

## 2017-08-25 DIAGNOSIS — I10 Essential (primary) hypertension: Secondary | ICD-10-CM | POA: Diagnosis not present

## 2017-08-25 DIAGNOSIS — E119 Type 2 diabetes mellitus without complications: Secondary | ICD-10-CM | POA: Diagnosis not present

## 2017-08-25 DIAGNOSIS — E782 Mixed hyperlipidemia: Secondary | ICD-10-CM | POA: Diagnosis not present

## 2017-08-25 DIAGNOSIS — N390 Urinary tract infection, site not specified: Secondary | ICD-10-CM | POA: Diagnosis not present

## 2017-08-25 DIAGNOSIS — R809 Proteinuria, unspecified: Secondary | ICD-10-CM | POA: Diagnosis not present

## 2017-08-25 DIAGNOSIS — N182 Chronic kidney disease, stage 2 (mild): Secondary | ICD-10-CM | POA: Diagnosis not present

## 2017-08-25 DIAGNOSIS — I129 Hypertensive chronic kidney disease with stage 1 through stage 4 chronic kidney disease, or unspecified chronic kidney disease: Secondary | ICD-10-CM | POA: Diagnosis present

## 2017-08-25 DIAGNOSIS — E669 Obesity, unspecified: Secondary | ICD-10-CM | POA: Diagnosis present

## 2017-08-25 DIAGNOSIS — G894 Chronic pain syndrome: Secondary | ICD-10-CM | POA: Diagnosis not present

## 2017-08-25 DIAGNOSIS — R270 Ataxia, unspecified: Secondary | ICD-10-CM | POA: Diagnosis not present

## 2017-08-25 DIAGNOSIS — R296 Repeated falls: Secondary | ICD-10-CM | POA: Diagnosis not present

## 2017-08-25 DIAGNOSIS — H8110 Benign paroxysmal vertigo, unspecified ear: Secondary | ICD-10-CM | POA: Diagnosis not present

## 2017-08-25 DIAGNOSIS — Z87440 Personal history of urinary (tract) infections: Secondary | ICD-10-CM | POA: Diagnosis not present

## 2017-08-25 DIAGNOSIS — E875 Hyperkalemia: Secondary | ICD-10-CM | POA: Diagnosis not present

## 2017-08-25 DIAGNOSIS — I160 Hypertensive urgency: Secondary | ICD-10-CM | POA: Diagnosis not present

## 2017-08-25 DIAGNOSIS — N183 Chronic kidney disease, stage 3 (moderate): Secondary | ICD-10-CM | POA: Diagnosis present

## 2017-08-25 DIAGNOSIS — I951 Orthostatic hypotension: Secondary | ICD-10-CM | POA: Diagnosis not present

## 2017-08-25 DIAGNOSIS — I639 Cerebral infarction, unspecified: Secondary | ICD-10-CM | POA: Diagnosis not present

## 2017-08-25 DIAGNOSIS — E1122 Type 2 diabetes mellitus with diabetic chronic kidney disease: Secondary | ICD-10-CM | POA: Diagnosis not present

## 2017-08-25 DIAGNOSIS — C8190 Hodgkin lymphoma, unspecified, unspecified site: Secondary | ICD-10-CM | POA: Diagnosis not present

## 2017-08-25 DIAGNOSIS — F419 Anxiety disorder, unspecified: Secondary | ICD-10-CM | POA: Diagnosis not present

## 2017-08-25 DIAGNOSIS — B9689 Other specified bacterial agents as the cause of diseases classified elsewhere: Secondary | ICD-10-CM | POA: Diagnosis not present

## 2017-08-25 DIAGNOSIS — F319 Bipolar disorder, unspecified: Secondary | ICD-10-CM | POA: Diagnosis present

## 2017-08-25 DIAGNOSIS — E1142 Type 2 diabetes mellitus with diabetic polyneuropathy: Secondary | ICD-10-CM | POA: Diagnosis present

## 2017-08-25 DIAGNOSIS — E871 Hypo-osmolality and hyponatremia: Secondary | ICD-10-CM | POA: Diagnosis not present

## 2017-08-25 DIAGNOSIS — M797 Fibromyalgia: Secondary | ICD-10-CM | POA: Diagnosis present

## 2017-08-25 DIAGNOSIS — B964 Proteus (mirabilis) (morganii) as the cause of diseases classified elsewhere: Secondary | ICD-10-CM | POA: Diagnosis not present

## 2017-08-25 DIAGNOSIS — K589 Irritable bowel syndrome without diarrhea: Secondary | ICD-10-CM | POA: Diagnosis present

## 2017-08-25 DIAGNOSIS — B9620 Unspecified Escherichia coli [E. coli] as the cause of diseases classified elsewhere: Secondary | ICD-10-CM | POA: Diagnosis not present

## 2017-08-25 DIAGNOSIS — E1169 Type 2 diabetes mellitus with other specified complication: Secondary | ICD-10-CM | POA: Diagnosis not present

## 2017-09-04 DIAGNOSIS — C8190 Hodgkin lymphoma, unspecified, unspecified site: Secondary | ICD-10-CM | POA: Diagnosis not present

## 2017-09-04 DIAGNOSIS — M797 Fibromyalgia: Secondary | ICD-10-CM | POA: Diagnosis not present

## 2017-09-04 DIAGNOSIS — J449 Chronic obstructive pulmonary disease, unspecified: Secondary | ICD-10-CM | POA: Diagnosis not present

## 2017-09-04 DIAGNOSIS — F319 Bipolar disorder, unspecified: Secondary | ICD-10-CM | POA: Diagnosis not present

## 2017-09-04 DIAGNOSIS — M545 Low back pain: Secondary | ICD-10-CM | POA: Diagnosis not present

## 2017-09-04 DIAGNOSIS — E1122 Type 2 diabetes mellitus with diabetic chronic kidney disease: Secondary | ICD-10-CM | POA: Diagnosis not present

## 2017-09-04 DIAGNOSIS — I120 Hypertensive chronic kidney disease with stage 5 chronic kidney disease or end stage renal disease: Secondary | ICD-10-CM | POA: Diagnosis not present

## 2017-09-04 DIAGNOSIS — G894 Chronic pain syndrome: Secondary | ICD-10-CM | POA: Diagnosis not present

## 2017-09-04 DIAGNOSIS — F419 Anxiety disorder, unspecified: Secondary | ICD-10-CM | POA: Diagnosis not present

## 2017-09-04 DIAGNOSIS — N183 Chronic kidney disease, stage 3 (moderate): Secondary | ICD-10-CM | POA: Diagnosis not present

## 2017-09-06 DIAGNOSIS — N183 Chronic kidney disease, stage 3 (moderate): Secondary | ICD-10-CM | POA: Diagnosis not present

## 2017-09-06 DIAGNOSIS — I120 Hypertensive chronic kidney disease with stage 5 chronic kidney disease or end stage renal disease: Secondary | ICD-10-CM | POA: Diagnosis not present

## 2017-09-06 DIAGNOSIS — J449 Chronic obstructive pulmonary disease, unspecified: Secondary | ICD-10-CM | POA: Diagnosis not present

## 2017-09-06 DIAGNOSIS — M545 Low back pain: Secondary | ICD-10-CM | POA: Diagnosis not present

## 2017-09-06 DIAGNOSIS — E1122 Type 2 diabetes mellitus with diabetic chronic kidney disease: Secondary | ICD-10-CM | POA: Diagnosis not present

## 2017-09-06 DIAGNOSIS — G894 Chronic pain syndrome: Secondary | ICD-10-CM | POA: Diagnosis not present

## 2017-09-08 DIAGNOSIS — N183 Chronic kidney disease, stage 3 (moderate): Secondary | ICD-10-CM | POA: Diagnosis not present

## 2017-09-08 DIAGNOSIS — J449 Chronic obstructive pulmonary disease, unspecified: Secondary | ICD-10-CM | POA: Diagnosis not present

## 2017-09-08 DIAGNOSIS — I120 Hypertensive chronic kidney disease with stage 5 chronic kidney disease or end stage renal disease: Secondary | ICD-10-CM | POA: Diagnosis not present

## 2017-09-08 DIAGNOSIS — E1122 Type 2 diabetes mellitus with diabetic chronic kidney disease: Secondary | ICD-10-CM | POA: Diagnosis not present

## 2017-09-08 DIAGNOSIS — G894 Chronic pain syndrome: Secondary | ICD-10-CM | POA: Diagnosis not present

## 2017-09-08 DIAGNOSIS — M545 Low back pain: Secondary | ICD-10-CM | POA: Diagnosis not present

## 2017-09-09 DIAGNOSIS — G894 Chronic pain syndrome: Secondary | ICD-10-CM | POA: Diagnosis not present

## 2017-09-09 DIAGNOSIS — S92902S Unspecified fracture of left foot, sequela: Secondary | ICD-10-CM | POA: Diagnosis not present

## 2017-09-09 DIAGNOSIS — E1169 Type 2 diabetes mellitus with other specified complication: Secondary | ICD-10-CM | POA: Diagnosis not present

## 2017-09-09 DIAGNOSIS — E538 Deficiency of other specified B group vitamins: Secondary | ICD-10-CM | POA: Diagnosis not present

## 2017-09-09 DIAGNOSIS — M797 Fibromyalgia: Secondary | ICD-10-CM | POA: Diagnosis not present

## 2017-09-09 DIAGNOSIS — I10 Essential (primary) hypertension: Secondary | ICD-10-CM | POA: Diagnosis not present

## 2017-09-09 DIAGNOSIS — E1142 Type 2 diabetes mellitus with diabetic polyneuropathy: Secondary | ICD-10-CM | POA: Diagnosis not present

## 2017-09-09 DIAGNOSIS — B351 Tinea unguium: Secondary | ICD-10-CM | POA: Diagnosis not present

## 2017-09-09 DIAGNOSIS — R5383 Other fatigue: Secondary | ICD-10-CM | POA: Diagnosis not present

## 2017-09-10 DIAGNOSIS — M545 Low back pain: Secondary | ICD-10-CM | POA: Diagnosis not present

## 2017-09-10 DIAGNOSIS — J449 Chronic obstructive pulmonary disease, unspecified: Secondary | ICD-10-CM | POA: Diagnosis not present

## 2017-09-10 DIAGNOSIS — I120 Hypertensive chronic kidney disease with stage 5 chronic kidney disease or end stage renal disease: Secondary | ICD-10-CM | POA: Diagnosis not present

## 2017-09-10 DIAGNOSIS — N183 Chronic kidney disease, stage 3 (moderate): Secondary | ICD-10-CM | POA: Diagnosis not present

## 2017-09-10 DIAGNOSIS — E1122 Type 2 diabetes mellitus with diabetic chronic kidney disease: Secondary | ICD-10-CM | POA: Diagnosis not present

## 2017-09-10 DIAGNOSIS — G894 Chronic pain syndrome: Secondary | ICD-10-CM | POA: Diagnosis not present

## 2017-09-11 DIAGNOSIS — I10 Essential (primary) hypertension: Secondary | ICD-10-CM | POA: Diagnosis not present

## 2017-09-11 DIAGNOSIS — E119 Type 2 diabetes mellitus without complications: Secondary | ICD-10-CM | POA: Diagnosis not present

## 2017-09-11 DIAGNOSIS — H35363 Drusen (degenerative) of macula, bilateral: Secondary | ICD-10-CM | POA: Diagnosis not present

## 2017-09-17 DIAGNOSIS — R531 Weakness: Secondary | ICD-10-CM | POA: Diagnosis not present

## 2017-09-17 DIAGNOSIS — R0902 Hypoxemia: Secondary | ICD-10-CM | POA: Diagnosis not present

## 2017-09-17 DIAGNOSIS — R296 Repeated falls: Secondary | ICD-10-CM | POA: Diagnosis not present

## 2017-09-17 DIAGNOSIS — K589 Irritable bowel syndrome without diarrhea: Secondary | ICD-10-CM | POA: Diagnosis not present

## 2017-09-17 DIAGNOSIS — R42 Dizziness and giddiness: Secondary | ICD-10-CM | POA: Diagnosis not present

## 2017-09-17 DIAGNOSIS — G250 Essential tremor: Secondary | ICD-10-CM | POA: Diagnosis not present

## 2017-09-17 DIAGNOSIS — E871 Hypo-osmolality and hyponatremia: Secondary | ICD-10-CM | POA: Diagnosis not present

## 2017-09-17 DIAGNOSIS — M797 Fibromyalgia: Secondary | ICD-10-CM | POA: Diagnosis not present

## 2017-09-17 DIAGNOSIS — I951 Orthostatic hypotension: Secondary | ICD-10-CM | POA: Diagnosis not present

## 2017-09-17 DIAGNOSIS — I129 Hypertensive chronic kidney disease with stage 1 through stage 4 chronic kidney disease, or unspecified chronic kidney disease: Secondary | ICD-10-CM | POA: Diagnosis not present

## 2017-09-17 DIAGNOSIS — G894 Chronic pain syndrome: Secondary | ICD-10-CM | POA: Diagnosis not present

## 2017-09-17 DIAGNOSIS — M545 Low back pain: Secondary | ICD-10-CM | POA: Diagnosis not present

## 2017-09-17 DIAGNOSIS — R0602 Shortness of breath: Secondary | ICD-10-CM | POA: Diagnosis not present

## 2017-09-17 DIAGNOSIS — E785 Hyperlipidemia, unspecified: Secondary | ICD-10-CM | POA: Diagnosis not present

## 2017-09-17 DIAGNOSIS — E1122 Type 2 diabetes mellitus with diabetic chronic kidney disease: Secondary | ICD-10-CM | POA: Diagnosis not present

## 2017-09-17 DIAGNOSIS — I10 Essential (primary) hypertension: Secondary | ICD-10-CM | POA: Diagnosis not present

## 2017-09-17 DIAGNOSIS — Z8673 Personal history of transient ischemic attack (TIA), and cerebral infarction without residual deficits: Secondary | ICD-10-CM | POA: Diagnosis not present

## 2017-09-17 DIAGNOSIS — I120 Hypertensive chronic kidney disease with stage 5 chronic kidney disease or end stage renal disease: Secondary | ICD-10-CM | POA: Diagnosis not present

## 2017-09-17 DIAGNOSIS — G4730 Sleep apnea, unspecified: Secondary | ICD-10-CM | POA: Diagnosis not present

## 2017-09-17 DIAGNOSIS — S59911A Unspecified injury of right forearm, initial encounter: Secondary | ICD-10-CM | POA: Diagnosis not present

## 2017-09-17 DIAGNOSIS — C8190 Hodgkin lymphoma, unspecified, unspecified site: Secondary | ICD-10-CM | POA: Diagnosis not present

## 2017-09-17 DIAGNOSIS — J449 Chronic obstructive pulmonary disease, unspecified: Secondary | ICD-10-CM | POA: Diagnosis not present

## 2017-09-17 DIAGNOSIS — R4182 Altered mental status, unspecified: Secondary | ICD-10-CM | POA: Diagnosis not present

## 2017-09-17 DIAGNOSIS — N189 Chronic kidney disease, unspecified: Secondary | ICD-10-CM | POA: Diagnosis not present

## 2017-09-17 DIAGNOSIS — F419 Anxiety disorder, unspecified: Secondary | ICD-10-CM | POA: Diagnosis not present

## 2017-09-17 DIAGNOSIS — F319 Bipolar disorder, unspecified: Secondary | ICD-10-CM | POA: Diagnosis not present

## 2017-09-17 DIAGNOSIS — K219 Gastro-esophageal reflux disease without esophagitis: Secondary | ICD-10-CM | POA: Diagnosis not present

## 2017-09-17 DIAGNOSIS — N183 Chronic kidney disease, stage 3 (moderate): Secondary | ICD-10-CM | POA: Diagnosis not present

## 2017-09-18 DIAGNOSIS — G4730 Sleep apnea, unspecified: Secondary | ICD-10-CM | POA: Diagnosis present

## 2017-09-18 DIAGNOSIS — N390 Urinary tract infection, site not specified: Secondary | ICD-10-CM | POA: Diagnosis not present

## 2017-09-18 DIAGNOSIS — R296 Repeated falls: Secondary | ICD-10-CM | POA: Diagnosis present

## 2017-09-18 DIAGNOSIS — F319 Bipolar disorder, unspecified: Secondary | ICD-10-CM | POA: Diagnosis not present

## 2017-09-18 DIAGNOSIS — N182 Chronic kidney disease, stage 2 (mild): Secondary | ICD-10-CM | POA: Diagnosis not present

## 2017-09-18 DIAGNOSIS — E1122 Type 2 diabetes mellitus with diabetic chronic kidney disease: Secondary | ICD-10-CM | POA: Diagnosis present

## 2017-09-18 DIAGNOSIS — E1169 Type 2 diabetes mellitus with other specified complication: Secondary | ICD-10-CM | POA: Diagnosis not present

## 2017-09-18 DIAGNOSIS — E785 Hyperlipidemia, unspecified: Secondary | ICD-10-CM | POA: Diagnosis present

## 2017-09-18 DIAGNOSIS — K219 Gastro-esophageal reflux disease without esophagitis: Secondary | ICD-10-CM | POA: Diagnosis present

## 2017-09-18 DIAGNOSIS — E119 Type 2 diabetes mellitus without complications: Secondary | ICD-10-CM | POA: Diagnosis not present

## 2017-09-18 DIAGNOSIS — I10 Essential (primary) hypertension: Secondary | ICD-10-CM | POA: Diagnosis not present

## 2017-09-18 DIAGNOSIS — C8190 Hodgkin lymphoma, unspecified, unspecified site: Secondary | ICD-10-CM | POA: Diagnosis present

## 2017-09-18 DIAGNOSIS — I160 Hypertensive urgency: Secondary | ICD-10-CM | POA: Diagnosis not present

## 2017-09-18 DIAGNOSIS — G250 Essential tremor: Secondary | ICD-10-CM | POA: Diagnosis present

## 2017-09-18 DIAGNOSIS — E871 Hypo-osmolality and hyponatremia: Secondary | ICD-10-CM | POA: Diagnosis not present

## 2017-09-18 DIAGNOSIS — Z8673 Personal history of transient ischemic attack (TIA), and cerebral infarction without residual deficits: Secondary | ICD-10-CM | POA: Diagnosis not present

## 2017-09-18 DIAGNOSIS — I129 Hypertensive chronic kidney disease with stage 1 through stage 4 chronic kidney disease, or unspecified chronic kidney disease: Secondary | ICD-10-CM | POA: Diagnosis present

## 2017-09-18 DIAGNOSIS — R4182 Altered mental status, unspecified: Secondary | ICD-10-CM | POA: Diagnosis present

## 2017-09-18 DIAGNOSIS — E782 Mixed hyperlipidemia: Secondary | ICD-10-CM | POA: Diagnosis not present

## 2017-09-18 DIAGNOSIS — F419 Anxiety disorder, unspecified: Secondary | ICD-10-CM | POA: Diagnosis present

## 2017-09-18 DIAGNOSIS — B9689 Other specified bacterial agents as the cause of diseases classified elsewhere: Secondary | ICD-10-CM | POA: Diagnosis not present

## 2017-09-18 DIAGNOSIS — I951 Orthostatic hypotension: Secondary | ICD-10-CM | POA: Diagnosis present

## 2017-09-18 DIAGNOSIS — M797 Fibromyalgia: Secondary | ICD-10-CM | POA: Diagnosis present

## 2017-09-18 DIAGNOSIS — K589 Irritable bowel syndrome without diarrhea: Secondary | ICD-10-CM | POA: Diagnosis present

## 2017-09-18 DIAGNOSIS — N189 Chronic kidney disease, unspecified: Secondary | ICD-10-CM | POA: Diagnosis present

## 2017-09-25 DIAGNOSIS — M48062 Spinal stenosis, lumbar region with neurogenic claudication: Secondary | ICD-10-CM | POA: Diagnosis not present

## 2017-10-09 DIAGNOSIS — E1040 Type 1 diabetes mellitus with diabetic neuropathy, unspecified: Secondary | ICD-10-CM | POA: Diagnosis not present

## 2017-10-09 DIAGNOSIS — B351 Tinea unguium: Secondary | ICD-10-CM | POA: Diagnosis not present

## 2017-10-09 DIAGNOSIS — L600 Ingrowing nail: Secondary | ICD-10-CM | POA: Diagnosis not present

## 2017-10-10 DIAGNOSIS — M48061 Spinal stenosis, lumbar region without neurogenic claudication: Secondary | ICD-10-CM | POA: Diagnosis not present

## 2017-10-10 DIAGNOSIS — M5416 Radiculopathy, lumbar region: Secondary | ICD-10-CM | POA: Diagnosis not present

## 2017-10-13 DIAGNOSIS — F319 Bipolar disorder, unspecified: Secondary | ICD-10-CM | POA: Diagnosis not present

## 2017-10-13 DIAGNOSIS — Z79899 Other long term (current) drug therapy: Secondary | ICD-10-CM | POA: Diagnosis not present

## 2017-10-13 DIAGNOSIS — E119 Type 2 diabetes mellitus without complications: Secondary | ICD-10-CM | POA: Diagnosis not present

## 2017-10-13 DIAGNOSIS — E538 Deficiency of other specified B group vitamins: Secondary | ICD-10-CM | POA: Diagnosis not present

## 2017-10-13 DIAGNOSIS — E782 Mixed hyperlipidemia: Secondary | ICD-10-CM | POA: Diagnosis not present

## 2017-10-13 DIAGNOSIS — I10 Essential (primary) hypertension: Secondary | ICD-10-CM | POA: Diagnosis not present

## 2017-10-13 DIAGNOSIS — K219 Gastro-esophageal reflux disease without esophagitis: Secondary | ICD-10-CM | POA: Diagnosis not present

## 2017-10-13 DIAGNOSIS — G250 Essential tremor: Secondary | ICD-10-CM | POA: Diagnosis not present

## 2017-10-23 DIAGNOSIS — M5416 Radiculopathy, lumbar region: Secondary | ICD-10-CM | POA: Diagnosis not present

## 2017-10-23 DIAGNOSIS — M48061 Spinal stenosis, lumbar region without neurogenic claudication: Secondary | ICD-10-CM | POA: Diagnosis not present

## 2017-10-30 DIAGNOSIS — R569 Unspecified convulsions: Secondary | ICD-10-CM | POA: Diagnosis not present

## 2017-10-30 DIAGNOSIS — M069 Rheumatoid arthritis, unspecified: Secondary | ICD-10-CM | POA: Diagnosis not present

## 2017-10-30 DIAGNOSIS — I10 Essential (primary) hypertension: Secondary | ICD-10-CM | POA: Diagnosis not present

## 2017-10-30 DIAGNOSIS — M48061 Spinal stenosis, lumbar region without neurogenic claudication: Secondary | ICD-10-CM | POA: Diagnosis not present

## 2017-10-30 DIAGNOSIS — E119 Type 2 diabetes mellitus without complications: Secondary | ICD-10-CM | POA: Diagnosis not present

## 2017-10-30 DIAGNOSIS — Z01818 Encounter for other preprocedural examination: Secondary | ICD-10-CM | POA: Diagnosis not present

## 2017-11-11 DIAGNOSIS — J020 Streptococcal pharyngitis: Secondary | ICD-10-CM | POA: Diagnosis not present

## 2017-11-11 DIAGNOSIS — J301 Allergic rhinitis due to pollen: Secondary | ICD-10-CM | POA: Diagnosis not present

## 2017-11-24 DIAGNOSIS — I10 Essential (primary) hypertension: Secondary | ICD-10-CM | POA: Diagnosis not present

## 2017-11-24 DIAGNOSIS — Z7984 Long term (current) use of oral hypoglycemic drugs: Secondary | ICD-10-CM | POA: Diagnosis not present

## 2017-11-24 DIAGNOSIS — M48062 Spinal stenosis, lumbar region with neurogenic claudication: Secondary | ICD-10-CM | POA: Diagnosis not present

## 2017-11-24 DIAGNOSIS — M48061 Spinal stenosis, lumbar region without neurogenic claudication: Secondary | ICD-10-CM | POA: Diagnosis not present

## 2017-11-24 DIAGNOSIS — E119 Type 2 diabetes mellitus without complications: Secondary | ICD-10-CM | POA: Diagnosis not present

## 2017-11-24 DIAGNOSIS — K219 Gastro-esophageal reflux disease without esophagitis: Secondary | ICD-10-CM | POA: Diagnosis not present

## 2017-11-24 DIAGNOSIS — E039 Hypothyroidism, unspecified: Secondary | ICD-10-CM | POA: Diagnosis not present

## 2017-11-24 DIAGNOSIS — G4730 Sleep apnea, unspecified: Secondary | ICD-10-CM | POA: Diagnosis not present

## 2017-11-24 DIAGNOSIS — Z794 Long term (current) use of insulin: Secondary | ICD-10-CM | POA: Diagnosis not present

## 2017-11-24 DIAGNOSIS — M1990 Unspecified osteoarthritis, unspecified site: Secondary | ICD-10-CM | POA: Diagnosis not present

## 2017-12-15 DIAGNOSIS — E782 Mixed hyperlipidemia: Secondary | ICD-10-CM | POA: Diagnosis not present

## 2017-12-15 DIAGNOSIS — E538 Deficiency of other specified B group vitamins: Secondary | ICD-10-CM | POA: Diagnosis not present

## 2017-12-15 DIAGNOSIS — Z79899 Other long term (current) drug therapy: Secondary | ICD-10-CM | POA: Diagnosis not present

## 2017-12-15 DIAGNOSIS — F329 Major depressive disorder, single episode, unspecified: Secondary | ICD-10-CM | POA: Diagnosis not present

## 2017-12-15 DIAGNOSIS — Z889 Allergy status to unspecified drugs, medicaments and biological substances status: Secondary | ICD-10-CM | POA: Diagnosis not present

## 2017-12-15 DIAGNOSIS — R3915 Urgency of urination: Secondary | ICD-10-CM | POA: Diagnosis not present

## 2017-12-16 DIAGNOSIS — E1040 Type 1 diabetes mellitus with diabetic neuropathy, unspecified: Secondary | ICD-10-CM | POA: Diagnosis not present

## 2017-12-16 DIAGNOSIS — M961 Postlaminectomy syndrome, not elsewhere classified: Secondary | ICD-10-CM | POA: Diagnosis not present

## 2017-12-16 DIAGNOSIS — M545 Low back pain: Secondary | ICD-10-CM | POA: Diagnosis not present

## 2017-12-18 DIAGNOSIS — M545 Low back pain: Secondary | ICD-10-CM | POA: Diagnosis not present

## 2017-12-23 DIAGNOSIS — M545 Low back pain: Secondary | ICD-10-CM | POA: Diagnosis not present

## 2017-12-23 DIAGNOSIS — M961 Postlaminectomy syndrome, not elsewhere classified: Secondary | ICD-10-CM | POA: Diagnosis not present

## 2017-12-25 DIAGNOSIS — M545 Low back pain: Secondary | ICD-10-CM | POA: Diagnosis not present

## 2017-12-25 DIAGNOSIS — M961 Postlaminectomy syndrome, not elsewhere classified: Secondary | ICD-10-CM | POA: Diagnosis not present

## 2017-12-29 DIAGNOSIS — M545 Low back pain: Secondary | ICD-10-CM | POA: Diagnosis not present

## 2018-01-05 DIAGNOSIS — E781 Pure hyperglyceridemia: Secondary | ICD-10-CM | POA: Diagnosis not present

## 2018-01-05 DIAGNOSIS — E7800 Pure hypercholesterolemia, unspecified: Secondary | ICD-10-CM | POA: Diagnosis not present

## 2018-01-05 DIAGNOSIS — I10 Essential (primary) hypertension: Secondary | ICD-10-CM | POA: Diagnosis not present

## 2018-01-05 DIAGNOSIS — E1165 Type 2 diabetes mellitus with hyperglycemia: Secondary | ICD-10-CM | POA: Diagnosis not present

## 2018-01-05 DIAGNOSIS — E559 Vitamin D deficiency, unspecified: Secondary | ICD-10-CM | POA: Diagnosis not present

## 2018-01-08 DIAGNOSIS — F419 Anxiety disorder, unspecified: Secondary | ICD-10-CM | POA: Diagnosis not present

## 2018-01-08 DIAGNOSIS — F4310 Post-traumatic stress disorder, unspecified: Secondary | ICD-10-CM | POA: Diagnosis not present

## 2018-01-08 DIAGNOSIS — F319 Bipolar disorder, unspecified: Secondary | ICD-10-CM | POA: Diagnosis not present

## 2018-01-15 DIAGNOSIS — M797 Fibromyalgia: Secondary | ICD-10-CM | POA: Diagnosis not present

## 2018-01-15 DIAGNOSIS — I10 Essential (primary) hypertension: Secondary | ICD-10-CM | POA: Diagnosis not present

## 2018-01-15 DIAGNOSIS — E538 Deficiency of other specified B group vitamins: Secondary | ICD-10-CM | POA: Diagnosis not present

## 2018-01-15 DIAGNOSIS — G894 Chronic pain syndrome: Secondary | ICD-10-CM | POA: Diagnosis not present

## 2018-01-15 DIAGNOSIS — K219 Gastro-esophageal reflux disease without esophagitis: Secondary | ICD-10-CM | POA: Diagnosis not present

## 2018-01-15 DIAGNOSIS — E1169 Type 2 diabetes mellitus with other specified complication: Secondary | ICD-10-CM | POA: Diagnosis not present

## 2018-01-22 DIAGNOSIS — E119 Type 2 diabetes mellitus without complications: Secondary | ICD-10-CM | POA: Diagnosis not present

## 2018-01-22 DIAGNOSIS — E871 Hypo-osmolality and hyponatremia: Secondary | ICD-10-CM | POA: Diagnosis not present

## 2018-01-22 DIAGNOSIS — N182 Chronic kidney disease, stage 2 (mild): Secondary | ICD-10-CM | POA: Diagnosis not present

## 2018-01-22 DIAGNOSIS — I10 Essential (primary) hypertension: Secondary | ICD-10-CM | POA: Diagnosis not present

## 2018-01-27 DIAGNOSIS — C4491 Basal cell carcinoma of skin, unspecified: Secondary | ICD-10-CM | POA: Diagnosis not present

## 2018-01-27 DIAGNOSIS — D649 Anemia, unspecified: Secondary | ICD-10-CM | POA: Diagnosis not present

## 2018-01-27 DIAGNOSIS — C8118 Nodular sclerosis classical Hodgkin lymphoma, lymph nodes of multiple sites: Secondary | ICD-10-CM | POA: Diagnosis not present

## 2018-03-20 DIAGNOSIS — Z9181 History of falling: Secondary | ICD-10-CM | POA: Diagnosis not present

## 2018-03-20 DIAGNOSIS — E119 Type 2 diabetes mellitus without complications: Secondary | ICD-10-CM | POA: Diagnosis not present

## 2018-03-20 DIAGNOSIS — N390 Urinary tract infection, site not specified: Secondary | ICD-10-CM | POA: Diagnosis not present

## 2018-03-20 DIAGNOSIS — E559 Vitamin D deficiency, unspecified: Secondary | ICD-10-CM | POA: Diagnosis not present

## 2018-03-20 DIAGNOSIS — D649 Anemia, unspecified: Secondary | ICD-10-CM | POA: Diagnosis not present

## 2018-03-20 DIAGNOSIS — E538 Deficiency of other specified B group vitamins: Secondary | ICD-10-CM | POA: Diagnosis not present

## 2018-03-20 DIAGNOSIS — I10 Essential (primary) hypertension: Secondary | ICD-10-CM | POA: Diagnosis not present

## 2018-04-10 DIAGNOSIS — J309 Allergic rhinitis, unspecified: Secondary | ICD-10-CM | POA: Diagnosis not present

## 2018-04-10 DIAGNOSIS — B373 Candidiasis of vulva and vagina: Secondary | ICD-10-CM | POA: Diagnosis not present

## 2018-04-10 DIAGNOSIS — R300 Dysuria: Secondary | ICD-10-CM | POA: Diagnosis not present

## 2018-04-11 DIAGNOSIS — I6389 Other cerebral infarction: Secondary | ICD-10-CM | POA: Diagnosis present

## 2018-04-11 DIAGNOSIS — G459 Transient cerebral ischemic attack, unspecified: Secondary | ICD-10-CM | POA: Diagnosis not present

## 2018-04-11 DIAGNOSIS — Z8673 Personal history of transient ischemic attack (TIA), and cerebral infarction without residual deficits: Secondary | ICD-10-CM | POA: Diagnosis not present

## 2018-04-11 DIAGNOSIS — Z888 Allergy status to other drugs, medicaments and biological substances status: Secondary | ICD-10-CM | POA: Diagnosis not present

## 2018-04-11 DIAGNOSIS — E785 Hyperlipidemia, unspecified: Secondary | ICD-10-CM | POA: Diagnosis present

## 2018-04-11 DIAGNOSIS — R202 Paresthesia of skin: Secondary | ICD-10-CM | POA: Diagnosis not present

## 2018-04-11 DIAGNOSIS — I10 Essential (primary) hypertension: Secondary | ICD-10-CM | POA: Diagnosis present

## 2018-04-11 DIAGNOSIS — I639 Cerebral infarction, unspecified: Secondary | ICD-10-CM | POA: Diagnosis not present

## 2018-04-11 DIAGNOSIS — Z7982 Long term (current) use of aspirin: Secondary | ICD-10-CM | POA: Diagnosis not present

## 2018-04-11 DIAGNOSIS — M84378A Stress fracture, left toe(s), initial encounter for fracture: Secondary | ICD-10-CM | POA: Diagnosis present

## 2018-04-11 DIAGNOSIS — R5381 Other malaise: Secondary | ICD-10-CM | POA: Diagnosis present

## 2018-04-11 DIAGNOSIS — R29818 Other symptoms and signs involving the nervous system: Secondary | ICD-10-CM | POA: Diagnosis not present

## 2018-04-11 DIAGNOSIS — R55 Syncope and collapse: Secondary | ICD-10-CM | POA: Diagnosis not present

## 2018-04-11 DIAGNOSIS — R531 Weakness: Secondary | ICD-10-CM | POA: Diagnosis not present

## 2018-04-11 DIAGNOSIS — Z881 Allergy status to other antibiotic agents status: Secondary | ICD-10-CM | POA: Diagnosis not present

## 2018-04-11 DIAGNOSIS — W19XXXA Unspecified fall, initial encounter: Secondary | ICD-10-CM | POA: Diagnosis not present

## 2018-04-24 DIAGNOSIS — E7800 Pure hypercholesterolemia, unspecified: Secondary | ICD-10-CM | POA: Diagnosis not present

## 2018-04-24 DIAGNOSIS — Z23 Encounter for immunization: Secondary | ICD-10-CM | POA: Diagnosis not present

## 2018-04-24 DIAGNOSIS — Z794 Long term (current) use of insulin: Secondary | ICD-10-CM | POA: Diagnosis not present

## 2018-04-24 DIAGNOSIS — Z1159 Encounter for screening for other viral diseases: Secondary | ICD-10-CM | POA: Diagnosis not present

## 2018-04-24 DIAGNOSIS — G510 Bell's palsy: Secondary | ICD-10-CM | POA: Diagnosis not present

## 2018-04-24 DIAGNOSIS — E1142 Type 2 diabetes mellitus with diabetic polyneuropathy: Secondary | ICD-10-CM | POA: Diagnosis not present

## 2018-04-24 DIAGNOSIS — K219 Gastro-esophageal reflux disease without esophagitis: Secondary | ICD-10-CM | POA: Diagnosis not present

## 2018-04-24 DIAGNOSIS — Z0000 Encounter for general adult medical examination without abnormal findings: Secondary | ICD-10-CM | POA: Diagnosis not present

## 2018-04-24 DIAGNOSIS — I119 Hypertensive heart disease without heart failure: Secondary | ICD-10-CM | POA: Diagnosis not present

## 2018-04-24 DIAGNOSIS — I69359 Hemiplegia and hemiparesis following cerebral infarction affecting unspecified side: Secondary | ICD-10-CM | POA: Diagnosis not present

## 2018-04-24 DIAGNOSIS — N3281 Overactive bladder: Secondary | ICD-10-CM | POA: Diagnosis not present

## 2018-04-24 DIAGNOSIS — Z7901 Long term (current) use of anticoagulants: Secondary | ICD-10-CM | POA: Diagnosis not present

## 2018-04-27 DIAGNOSIS — E1142 Type 2 diabetes mellitus with diabetic polyneuropathy: Secondary | ICD-10-CM | POA: Diagnosis not present

## 2018-04-27 DIAGNOSIS — Z1159 Encounter for screening for other viral diseases: Secondary | ICD-10-CM | POA: Diagnosis not present

## 2018-04-27 DIAGNOSIS — Z79899 Other long term (current) drug therapy: Secondary | ICD-10-CM | POA: Diagnosis not present

## 2018-04-27 DIAGNOSIS — R8290 Unspecified abnormal findings in urine: Secondary | ICD-10-CM | POA: Diagnosis not present

## 2018-04-27 DIAGNOSIS — E559 Vitamin D deficiency, unspecified: Secondary | ICD-10-CM | POA: Diagnosis not present

## 2018-04-27 DIAGNOSIS — I119 Hypertensive heart disease without heart failure: Secondary | ICD-10-CM | POA: Diagnosis not present

## 2018-04-27 DIAGNOSIS — E538 Deficiency of other specified B group vitamins: Secondary | ICD-10-CM | POA: Diagnosis not present

## 2018-05-05 DIAGNOSIS — E119 Type 2 diabetes mellitus without complications: Secondary | ICD-10-CM | POA: Diagnosis not present

## 2018-05-05 DIAGNOSIS — S92335A Nondisplaced fracture of third metatarsal bone, left foot, initial encounter for closed fracture: Secondary | ICD-10-CM | POA: Diagnosis not present

## 2018-05-05 DIAGNOSIS — K5792 Diverticulitis of intestine, part unspecified, without perforation or abscess without bleeding: Secondary | ICD-10-CM | POA: Insufficient documentation

## 2018-05-05 DIAGNOSIS — Z8673 Personal history of transient ischemic attack (TIA), and cerebral infarction without residual deficits: Secondary | ICD-10-CM | POA: Diagnosis not present

## 2018-05-05 DIAGNOSIS — M19072 Primary osteoarthritis, left ankle and foot: Secondary | ICD-10-CM | POA: Diagnosis not present

## 2018-05-05 DIAGNOSIS — I10 Essential (primary) hypertension: Secondary | ICD-10-CM | POA: Insufficient documentation

## 2018-05-05 DIAGNOSIS — F418 Other specified anxiety disorders: Secondary | ICD-10-CM | POA: Diagnosis not present

## 2018-05-05 DIAGNOSIS — Z8679 Personal history of other diseases of the circulatory system: Secondary | ICD-10-CM | POA: Diagnosis not present

## 2018-05-05 DIAGNOSIS — J449 Chronic obstructive pulmonary disease, unspecified: Secondary | ICD-10-CM | POA: Diagnosis not present

## 2018-05-05 DIAGNOSIS — S92345A Nondisplaced fracture of fourth metatarsal bone, left foot, initial encounter for closed fracture: Secondary | ICD-10-CM | POA: Diagnosis not present

## 2018-05-11 DIAGNOSIS — Z86010 Personal history of colonic polyps: Secondary | ICD-10-CM | POA: Diagnosis not present

## 2018-05-11 DIAGNOSIS — K219 Gastro-esophageal reflux disease without esophagitis: Secondary | ICD-10-CM | POA: Diagnosis not present

## 2018-05-11 DIAGNOSIS — R159 Full incontinence of feces: Secondary | ICD-10-CM | POA: Diagnosis not present

## 2018-05-11 DIAGNOSIS — K580 Irritable bowel syndrome with diarrhea: Secondary | ICD-10-CM | POA: Diagnosis not present

## 2018-05-26 DIAGNOSIS — J449 Chronic obstructive pulmonary disease, unspecified: Secondary | ICD-10-CM | POA: Diagnosis not present

## 2018-05-26 DIAGNOSIS — F418 Other specified anxiety disorders: Secondary | ICD-10-CM | POA: Diagnosis not present

## 2018-05-26 DIAGNOSIS — Z8679 Personal history of other diseases of the circulatory system: Secondary | ICD-10-CM | POA: Diagnosis not present

## 2018-05-26 DIAGNOSIS — F411 Generalized anxiety disorder: Secondary | ICD-10-CM | POA: Diagnosis not present

## 2018-05-26 DIAGNOSIS — M19072 Primary osteoarthritis, left ankle and foot: Secondary | ICD-10-CM | POA: Diagnosis not present

## 2018-05-26 DIAGNOSIS — Z8673 Personal history of transient ischemic attack (TIA), and cerebral infarction without residual deficits: Secondary | ICD-10-CM | POA: Diagnosis not present

## 2018-05-26 DIAGNOSIS — E119 Type 2 diabetes mellitus without complications: Secondary | ICD-10-CM | POA: Diagnosis not present

## 2018-05-26 DIAGNOSIS — S92335A Nondisplaced fracture of third metatarsal bone, left foot, initial encounter for closed fracture: Secondary | ICD-10-CM | POA: Diagnosis not present

## 2018-05-26 DIAGNOSIS — S92345A Nondisplaced fracture of fourth metatarsal bone, left foot, initial encounter for closed fracture: Secondary | ICD-10-CM | POA: Diagnosis not present

## 2018-05-26 DIAGNOSIS — F3132 Bipolar disorder, current episode depressed, moderate: Secondary | ICD-10-CM | POA: Diagnosis not present

## 2018-06-15 DIAGNOSIS — N390 Urinary tract infection, site not specified: Secondary | ICD-10-CM | POA: Diagnosis not present

## 2018-06-15 DIAGNOSIS — J019 Acute sinusitis, unspecified: Secondary | ICD-10-CM | POA: Diagnosis not present

## 2018-06-17 DIAGNOSIS — F418 Other specified anxiety disorders: Secondary | ICD-10-CM | POA: Diagnosis not present

## 2018-06-17 DIAGNOSIS — S92335A Nondisplaced fracture of third metatarsal bone, left foot, initial encounter for closed fracture: Secondary | ICD-10-CM | POA: Diagnosis not present

## 2018-06-17 DIAGNOSIS — S92345A Nondisplaced fracture of fourth metatarsal bone, left foot, initial encounter for closed fracture: Secondary | ICD-10-CM | POA: Diagnosis not present

## 2018-06-17 DIAGNOSIS — J449 Chronic obstructive pulmonary disease, unspecified: Secondary | ICD-10-CM | POA: Diagnosis not present

## 2018-06-17 DIAGNOSIS — Z8673 Personal history of transient ischemic attack (TIA), and cerebral infarction without residual deficits: Secondary | ICD-10-CM | POA: Diagnosis not present

## 2018-06-17 DIAGNOSIS — E119 Type 2 diabetes mellitus without complications: Secondary | ICD-10-CM | POA: Diagnosis not present

## 2018-06-17 DIAGNOSIS — Z8679 Personal history of other diseases of the circulatory system: Secondary | ICD-10-CM | POA: Diagnosis not present

## 2018-06-17 DIAGNOSIS — M19072 Primary osteoarthritis, left ankle and foot: Secondary | ICD-10-CM | POA: Diagnosis not present

## 2018-06-19 DIAGNOSIS — C8110 Nodular sclerosis classical Hodgkin lymphoma, unspecified site: Secondary | ICD-10-CM | POA: Diagnosis not present

## 2018-06-19 DIAGNOSIS — C8590 Non-Hodgkin lymphoma, unspecified, unspecified site: Secondary | ICD-10-CM | POA: Diagnosis not present

## 2018-07-01 DIAGNOSIS — T148XXA Other injury of unspecified body region, initial encounter: Secondary | ICD-10-CM | POA: Diagnosis not present

## 2018-07-01 DIAGNOSIS — S40012A Contusion of left shoulder, initial encounter: Secondary | ICD-10-CM | POA: Diagnosis not present

## 2018-07-01 DIAGNOSIS — M479 Spondylosis, unspecified: Secondary | ICD-10-CM | POA: Diagnosis not present

## 2018-07-01 DIAGNOSIS — M519 Unspecified thoracic, thoracolumbar and lumbosacral intervertebral disc disorder: Secondary | ICD-10-CM | POA: Diagnosis not present

## 2018-07-01 DIAGNOSIS — M79602 Pain in left arm: Secondary | ICD-10-CM | POA: Diagnosis not present

## 2018-07-01 DIAGNOSIS — M25512 Pain in left shoulder: Secondary | ICD-10-CM | POA: Diagnosis not present

## 2018-07-01 DIAGNOSIS — M797 Fibromyalgia: Secondary | ICD-10-CM | POA: Diagnosis not present

## 2018-07-01 DIAGNOSIS — F419 Anxiety disorder, unspecified: Secondary | ICD-10-CM | POA: Diagnosis not present

## 2018-07-01 DIAGNOSIS — E785 Hyperlipidemia, unspecified: Secondary | ICD-10-CM | POA: Diagnosis not present

## 2018-07-01 DIAGNOSIS — R51 Headache: Secondary | ICD-10-CM | POA: Diagnosis not present

## 2018-07-01 DIAGNOSIS — S1093XA Contusion of unspecified part of neck, initial encounter: Secondary | ICD-10-CM | POA: Diagnosis not present

## 2018-07-01 DIAGNOSIS — S40022A Contusion of left upper arm, initial encounter: Secondary | ICD-10-CM | POA: Diagnosis not present

## 2018-07-01 DIAGNOSIS — I10 Essential (primary) hypertension: Secondary | ICD-10-CM | POA: Diagnosis not present

## 2018-07-01 DIAGNOSIS — M542 Cervicalgia: Secondary | ICD-10-CM | POA: Diagnosis not present

## 2018-07-08 DIAGNOSIS — M75122 Complete rotator cuff tear or rupture of left shoulder, not specified as traumatic: Secondary | ICD-10-CM | POA: Diagnosis not present

## 2018-07-08 DIAGNOSIS — M19072 Primary osteoarthritis, left ankle and foot: Secondary | ICD-10-CM | POA: Diagnosis not present

## 2018-07-08 DIAGNOSIS — Z8679 Personal history of other diseases of the circulatory system: Secondary | ICD-10-CM | POA: Diagnosis not present

## 2018-07-08 DIAGNOSIS — E119 Type 2 diabetes mellitus without complications: Secondary | ICD-10-CM | POA: Diagnosis not present

## 2018-07-08 DIAGNOSIS — J449 Chronic obstructive pulmonary disease, unspecified: Secondary | ICD-10-CM | POA: Diagnosis not present

## 2018-07-08 DIAGNOSIS — S92335A Nondisplaced fracture of third metatarsal bone, left foot, initial encounter for closed fracture: Secondary | ICD-10-CM | POA: Diagnosis not present

## 2018-07-08 DIAGNOSIS — S92345A Nondisplaced fracture of fourth metatarsal bone, left foot, initial encounter for closed fracture: Secondary | ICD-10-CM | POA: Diagnosis not present

## 2018-07-08 DIAGNOSIS — F418 Other specified anxiety disorders: Secondary | ICD-10-CM | POA: Diagnosis not present

## 2018-07-08 DIAGNOSIS — Z8673 Personal history of transient ischemic attack (TIA), and cerebral infarction without residual deficits: Secondary | ICD-10-CM | POA: Diagnosis not present

## 2018-07-09 DIAGNOSIS — F3132 Bipolar disorder, current episode depressed, moderate: Secondary | ICD-10-CM | POA: Diagnosis not present

## 2018-07-09 DIAGNOSIS — F411 Generalized anxiety disorder: Secondary | ICD-10-CM | POA: Diagnosis not present

## 2018-07-17 DIAGNOSIS — F319 Bipolar disorder, unspecified: Secondary | ICD-10-CM | POA: Diagnosis not present

## 2018-07-17 DIAGNOSIS — K219 Gastro-esophageal reflux disease without esophagitis: Secondary | ICD-10-CM | POA: Diagnosis not present

## 2018-07-17 DIAGNOSIS — I69359 Hemiplegia and hemiparesis following cerebral infarction affecting unspecified side: Secondary | ICD-10-CM | POA: Diagnosis not present

## 2018-07-17 DIAGNOSIS — I119 Hypertensive heart disease without heart failure: Secondary | ICD-10-CM | POA: Diagnosis not present

## 2018-07-17 DIAGNOSIS — G510 Bell's palsy: Secondary | ICD-10-CM | POA: Diagnosis not present

## 2018-07-17 DIAGNOSIS — N3281 Overactive bladder: Secondary | ICD-10-CM | POA: Diagnosis not present

## 2018-07-17 DIAGNOSIS — G250 Essential tremor: Secondary | ICD-10-CM | POA: Diagnosis not present

## 2018-07-17 DIAGNOSIS — M5136 Other intervertebral disc degeneration, lumbar region: Secondary | ICD-10-CM | POA: Diagnosis not present

## 2018-07-17 DIAGNOSIS — E1142 Type 2 diabetes mellitus with diabetic polyneuropathy: Secondary | ICD-10-CM | POA: Diagnosis not present

## 2018-07-17 DIAGNOSIS — M5030 Other cervical disc degeneration, unspecified cervical region: Secondary | ICD-10-CM | POA: Diagnosis not present

## 2018-07-17 DIAGNOSIS — Z7901 Long term (current) use of anticoagulants: Secondary | ICD-10-CM | POA: Diagnosis not present

## 2018-07-17 DIAGNOSIS — Z794 Long term (current) use of insulin: Secondary | ICD-10-CM | POA: Diagnosis not present

## 2018-07-27 DIAGNOSIS — S92335A Nondisplaced fracture of third metatarsal bone, left foot, initial encounter for closed fracture: Secondary | ICD-10-CM | POA: Diagnosis not present

## 2018-07-27 DIAGNOSIS — Z8673 Personal history of transient ischemic attack (TIA), and cerebral infarction without residual deficits: Secondary | ICD-10-CM | POA: Diagnosis not present

## 2018-07-27 DIAGNOSIS — E119 Type 2 diabetes mellitus without complications: Secondary | ICD-10-CM | POA: Diagnosis not present

## 2018-07-27 DIAGNOSIS — F418 Other specified anxiety disorders: Secondary | ICD-10-CM | POA: Diagnosis not present

## 2018-07-27 DIAGNOSIS — M19072 Primary osteoarthritis, left ankle and foot: Secondary | ICD-10-CM | POA: Diagnosis not present

## 2018-07-27 DIAGNOSIS — J449 Chronic obstructive pulmonary disease, unspecified: Secondary | ICD-10-CM | POA: Diagnosis not present

## 2018-07-27 DIAGNOSIS — Z8679 Personal history of other diseases of the circulatory system: Secondary | ICD-10-CM | POA: Diagnosis not present

## 2018-07-27 DIAGNOSIS — S92345A Nondisplaced fracture of fourth metatarsal bone, left foot, initial encounter for closed fracture: Secondary | ICD-10-CM | POA: Diagnosis not present

## 2018-07-27 DIAGNOSIS — M75122 Complete rotator cuff tear or rupture of left shoulder, not specified as traumatic: Secondary | ICD-10-CM | POA: Diagnosis not present

## 2018-07-28 DIAGNOSIS — R296 Repeated falls: Secondary | ICD-10-CM | POA: Diagnosis not present

## 2018-07-28 DIAGNOSIS — S060X0A Concussion without loss of consciousness, initial encounter: Secondary | ICD-10-CM | POA: Diagnosis not present

## 2018-07-28 DIAGNOSIS — R260 Ataxic gait: Secondary | ICD-10-CM | POA: Diagnosis not present

## 2018-07-28 DIAGNOSIS — G510 Bell's palsy: Secondary | ICD-10-CM | POA: Diagnosis not present

## 2018-07-28 DIAGNOSIS — T50905A Adverse effect of unspecified drugs, medicaments and biological substances, initial encounter: Secondary | ICD-10-CM | POA: Diagnosis not present

## 2018-07-28 DIAGNOSIS — M62838 Other muscle spasm: Secondary | ICD-10-CM | POA: Diagnosis not present

## 2018-07-28 DIAGNOSIS — E663 Overweight: Secondary | ICD-10-CM | POA: Diagnosis not present

## 2018-07-28 DIAGNOSIS — G250 Essential tremor: Secondary | ICD-10-CM | POA: Diagnosis not present

## 2018-07-28 DIAGNOSIS — H81393 Other peripheral vertigo, bilateral: Secondary | ICD-10-CM | POA: Diagnosis not present

## 2018-07-30 DIAGNOSIS — M75122 Complete rotator cuff tear or rupture of left shoulder, not specified as traumatic: Secondary | ICD-10-CM | POA: Diagnosis not present

## 2018-08-03 DIAGNOSIS — E1142 Type 2 diabetes mellitus with diabetic polyneuropathy: Secondary | ICD-10-CM | POA: Diagnosis not present

## 2018-08-03 DIAGNOSIS — F411 Generalized anxiety disorder: Secondary | ICD-10-CM | POA: Diagnosis not present

## 2018-08-03 DIAGNOSIS — F3132 Bipolar disorder, current episode depressed, moderate: Secondary | ICD-10-CM | POA: Diagnosis not present

## 2018-08-04 DIAGNOSIS — H43813 Vitreous degeneration, bilateral: Secondary | ICD-10-CM | POA: Diagnosis not present

## 2018-08-04 DIAGNOSIS — H2513 Age-related nuclear cataract, bilateral: Secondary | ICD-10-CM | POA: Diagnosis not present

## 2018-08-04 DIAGNOSIS — H3589 Other specified retinal disorders: Secondary | ICD-10-CM | POA: Diagnosis not present

## 2018-08-04 DIAGNOSIS — E119 Type 2 diabetes mellitus without complications: Secondary | ICD-10-CM | POA: Diagnosis not present

## 2018-08-05 DIAGNOSIS — S92345A Nondisplaced fracture of fourth metatarsal bone, left foot, initial encounter for closed fracture: Secondary | ICD-10-CM | POA: Diagnosis not present

## 2018-08-05 DIAGNOSIS — M19072 Primary osteoarthritis, left ankle and foot: Secondary | ICD-10-CM | POA: Diagnosis not present

## 2018-08-05 DIAGNOSIS — J449 Chronic obstructive pulmonary disease, unspecified: Secondary | ICD-10-CM | POA: Diagnosis not present

## 2018-08-05 DIAGNOSIS — M75122 Complete rotator cuff tear or rupture of left shoulder, not specified as traumatic: Secondary | ICD-10-CM | POA: Diagnosis not present

## 2018-08-05 DIAGNOSIS — F418 Other specified anxiety disorders: Secondary | ICD-10-CM | POA: Diagnosis not present

## 2018-08-05 DIAGNOSIS — Z8679 Personal history of other diseases of the circulatory system: Secondary | ICD-10-CM | POA: Diagnosis not present

## 2018-08-05 DIAGNOSIS — E119 Type 2 diabetes mellitus without complications: Secondary | ICD-10-CM | POA: Diagnosis not present

## 2018-08-05 DIAGNOSIS — Z8673 Personal history of transient ischemic attack (TIA), and cerebral infarction without residual deficits: Secondary | ICD-10-CM | POA: Diagnosis not present

## 2018-08-05 DIAGNOSIS — S92335A Nondisplaced fracture of third metatarsal bone, left foot, initial encounter for closed fracture: Secondary | ICD-10-CM | POA: Diagnosis not present

## 2018-08-07 DIAGNOSIS — M79674 Pain in right toe(s): Secondary | ICD-10-CM | POA: Diagnosis not present

## 2018-08-07 DIAGNOSIS — E1151 Type 2 diabetes mellitus with diabetic peripheral angiopathy without gangrene: Secondary | ICD-10-CM | POA: Diagnosis not present

## 2018-08-07 DIAGNOSIS — S92345D Nondisplaced fracture of fourth metatarsal bone, left foot, subsequent encounter for fracture with routine healing: Secondary | ICD-10-CM | POA: Diagnosis not present

## 2018-08-07 DIAGNOSIS — S92335D Nondisplaced fracture of third metatarsal bone, left foot, subsequent encounter for fracture with routine healing: Secondary | ICD-10-CM | POA: Diagnosis not present

## 2018-08-07 DIAGNOSIS — M79676 Pain in unspecified toe(s): Secondary | ICD-10-CM | POA: Diagnosis not present

## 2018-08-20 DIAGNOSIS — R300 Dysuria: Secondary | ICD-10-CM | POA: Diagnosis not present

## 2018-08-20 DIAGNOSIS — E1142 Type 2 diabetes mellitus with diabetic polyneuropathy: Secondary | ICD-10-CM | POA: Diagnosis not present

## 2018-08-20 DIAGNOSIS — F411 Generalized anxiety disorder: Secondary | ICD-10-CM | POA: Diagnosis not present

## 2018-08-20 DIAGNOSIS — F3132 Bipolar disorder, current episode depressed, moderate: Secondary | ICD-10-CM | POA: Diagnosis not present

## 2018-08-20 DIAGNOSIS — R05 Cough: Secondary | ICD-10-CM | POA: Diagnosis not present

## 2018-08-20 DIAGNOSIS — Z1231 Encounter for screening mammogram for malignant neoplasm of breast: Secondary | ICD-10-CM | POA: Diagnosis not present

## 2018-08-27 DIAGNOSIS — R300 Dysuria: Secondary | ICD-10-CM | POA: Diagnosis not present

## 2018-09-02 DIAGNOSIS — G250 Essential tremor: Secondary | ICD-10-CM | POA: Diagnosis not present

## 2018-09-02 DIAGNOSIS — R296 Repeated falls: Secondary | ICD-10-CM | POA: Diagnosis not present

## 2018-09-02 DIAGNOSIS — G5622 Lesion of ulnar nerve, left upper limb: Secondary | ICD-10-CM | POA: Diagnosis not present

## 2018-09-02 DIAGNOSIS — G510 Bell's palsy: Secondary | ICD-10-CM | POA: Diagnosis not present

## 2018-09-02 DIAGNOSIS — R260 Ataxic gait: Secondary | ICD-10-CM | POA: Diagnosis not present

## 2018-09-02 DIAGNOSIS — H81393 Other peripheral vertigo, bilateral: Secondary | ICD-10-CM | POA: Diagnosis not present

## 2018-09-02 DIAGNOSIS — T50905A Adverse effect of unspecified drugs, medicaments and biological substances, initial encounter: Secondary | ICD-10-CM | POA: Diagnosis not present

## 2018-09-02 DIAGNOSIS — E663 Overweight: Secondary | ICD-10-CM | POA: Diagnosis not present

## 2018-09-02 DIAGNOSIS — G5732 Lesion of lateral popliteal nerve, left lower limb: Secondary | ICD-10-CM | POA: Diagnosis not present

## 2018-09-02 DIAGNOSIS — M62838 Other muscle spasm: Secondary | ICD-10-CM | POA: Diagnosis not present

## 2018-09-04 DIAGNOSIS — E119 Type 2 diabetes mellitus without complications: Secondary | ICD-10-CM | POA: Diagnosis not present

## 2018-09-04 DIAGNOSIS — H3589 Other specified retinal disorders: Secondary | ICD-10-CM | POA: Diagnosis not present

## 2018-09-04 DIAGNOSIS — H2513 Age-related nuclear cataract, bilateral: Secondary | ICD-10-CM | POA: Diagnosis not present

## 2018-09-04 DIAGNOSIS — H43813 Vitreous degeneration, bilateral: Secondary | ICD-10-CM | POA: Diagnosis not present

## 2018-09-15 DIAGNOSIS — K2950 Unspecified chronic gastritis without bleeding: Secondary | ICD-10-CM | POA: Diagnosis not present

## 2018-09-15 DIAGNOSIS — K580 Irritable bowel syndrome with diarrhea: Secondary | ICD-10-CM | POA: Diagnosis not present

## 2018-09-15 DIAGNOSIS — D123 Benign neoplasm of transverse colon: Secondary | ICD-10-CM | POA: Diagnosis not present

## 2018-09-15 DIAGNOSIS — K219 Gastro-esophageal reflux disease without esophagitis: Secondary | ICD-10-CM | POA: Diagnosis not present

## 2018-09-15 DIAGNOSIS — K317 Polyp of stomach and duodenum: Secondary | ICD-10-CM | POA: Diagnosis not present

## 2018-09-15 DIAGNOSIS — K635 Polyp of colon: Secondary | ICD-10-CM | POA: Diagnosis not present

## 2018-09-15 DIAGNOSIS — Z79899 Other long term (current) drug therapy: Secondary | ICD-10-CM | POA: Diagnosis not present

## 2018-09-15 DIAGNOSIS — I10 Essential (primary) hypertension: Secondary | ICD-10-CM | POA: Diagnosis not present

## 2018-09-15 DIAGNOSIS — Z794 Long term (current) use of insulin: Secondary | ICD-10-CM | POA: Diagnosis not present

## 2018-09-15 DIAGNOSIS — F319 Bipolar disorder, unspecified: Secondary | ICD-10-CM | POA: Diagnosis not present

## 2018-09-15 DIAGNOSIS — Z8571 Personal history of Hodgkin lymphoma: Secondary | ICD-10-CM | POA: Diagnosis not present

## 2018-09-15 DIAGNOSIS — K648 Other hemorrhoids: Secondary | ICD-10-CM | POA: Diagnosis not present

## 2018-09-15 DIAGNOSIS — E785 Hyperlipidemia, unspecified: Secondary | ICD-10-CM | POA: Diagnosis not present

## 2018-09-15 DIAGNOSIS — K649 Unspecified hemorrhoids: Secondary | ICD-10-CM | POA: Diagnosis not present

## 2018-09-15 DIAGNOSIS — I69354 Hemiplegia and hemiparesis following cerebral infarction affecting left non-dominant side: Secondary | ICD-10-CM | POA: Diagnosis not present

## 2018-09-15 DIAGNOSIS — G4730 Sleep apnea, unspecified: Secondary | ICD-10-CM | POA: Diagnosis not present

## 2018-09-15 DIAGNOSIS — Z86010 Personal history of colonic polyps: Secondary | ICD-10-CM | POA: Diagnosis not present

## 2018-09-15 DIAGNOSIS — E119 Type 2 diabetes mellitus without complications: Secondary | ICD-10-CM | POA: Diagnosis not present

## 2018-09-22 DIAGNOSIS — F3132 Bipolar disorder, current episode depressed, moderate: Secondary | ICD-10-CM | POA: Diagnosis not present

## 2018-09-22 DIAGNOSIS — F411 Generalized anxiety disorder: Secondary | ICD-10-CM | POA: Diagnosis not present

## 2018-09-28 DIAGNOSIS — K219 Gastro-esophageal reflux disease without esophagitis: Secondary | ICD-10-CM | POA: Diagnosis not present

## 2018-09-28 DIAGNOSIS — Z86010 Personal history of colonic polyps: Secondary | ICD-10-CM | POA: Diagnosis not present

## 2018-09-28 DIAGNOSIS — K580 Irritable bowel syndrome with diarrhea: Secondary | ICD-10-CM | POA: Diagnosis not present

## 2018-10-07 DIAGNOSIS — G250 Essential tremor: Secondary | ICD-10-CM | POA: Diagnosis not present

## 2018-10-07 DIAGNOSIS — E1142 Type 2 diabetes mellitus with diabetic polyneuropathy: Secondary | ICD-10-CM | POA: Diagnosis not present

## 2018-10-07 DIAGNOSIS — M5136 Other intervertebral disc degeneration, lumbar region: Secondary | ICD-10-CM | POA: Diagnosis not present

## 2018-10-07 DIAGNOSIS — Z794 Long term (current) use of insulin: Secondary | ICD-10-CM | POA: Diagnosis not present

## 2018-10-07 DIAGNOSIS — M5030 Other cervical disc degeneration, unspecified cervical region: Secondary | ICD-10-CM | POA: Diagnosis not present

## 2018-10-07 DIAGNOSIS — I119 Hypertensive heart disease without heart failure: Secondary | ICD-10-CM | POA: Diagnosis not present

## 2018-10-07 DIAGNOSIS — N3281 Overactive bladder: Secondary | ICD-10-CM | POA: Diagnosis not present

## 2018-10-07 DIAGNOSIS — Z7901 Long term (current) use of anticoagulants: Secondary | ICD-10-CM | POA: Diagnosis not present

## 2018-10-07 DIAGNOSIS — F319 Bipolar disorder, unspecified: Secondary | ICD-10-CM | POA: Diagnosis not present

## 2018-10-07 DIAGNOSIS — I69359 Hemiplegia and hemiparesis following cerebral infarction affecting unspecified side: Secondary | ICD-10-CM | POA: Diagnosis not present

## 2018-10-07 DIAGNOSIS — K219 Gastro-esophageal reflux disease without esophagitis: Secondary | ICD-10-CM | POA: Diagnosis not present

## 2018-10-07 DIAGNOSIS — G510 Bell's palsy: Secondary | ICD-10-CM | POA: Diagnosis not present

## 2018-10-22 DIAGNOSIS — R0902 Hypoxemia: Secondary | ICD-10-CM | POA: Diagnosis not present

## 2018-10-22 DIAGNOSIS — R531 Weakness: Secondary | ICD-10-CM | POA: Diagnosis not present

## 2018-10-22 DIAGNOSIS — S0990XA Unspecified injury of head, initial encounter: Secondary | ICD-10-CM | POA: Diagnosis not present

## 2018-10-22 DIAGNOSIS — M4180 Other forms of scoliosis, site unspecified: Secondary | ICD-10-CM | POA: Diagnosis not present

## 2018-10-22 DIAGNOSIS — I69398 Other sequelae of cerebral infarction: Secondary | ICD-10-CM | POA: Diagnosis not present

## 2018-10-22 DIAGNOSIS — S32010A Wedge compression fracture of first lumbar vertebra, initial encounter for closed fracture: Secondary | ICD-10-CM | POA: Diagnosis not present

## 2018-10-22 DIAGNOSIS — R9431 Abnormal electrocardiogram [ECG] [EKG]: Secondary | ICD-10-CM | POA: Diagnosis not present

## 2018-10-22 DIAGNOSIS — W19XXXA Unspecified fall, initial encounter: Secondary | ICD-10-CM | POA: Diagnosis not present

## 2018-10-22 DIAGNOSIS — R52 Pain, unspecified: Secondary | ICD-10-CM | POA: Diagnosis not present

## 2018-10-22 DIAGNOSIS — I10 Essential (primary) hypertension: Secondary | ICD-10-CM | POA: Diagnosis not present

## 2018-10-22 DIAGNOSIS — E119 Type 2 diabetes mellitus without complications: Secondary | ICD-10-CM | POA: Diagnosis not present

## 2018-10-22 DIAGNOSIS — N200 Calculus of kidney: Secondary | ICD-10-CM | POA: Diagnosis not present

## 2018-10-22 DIAGNOSIS — K469 Unspecified abdominal hernia without obstruction or gangrene: Secondary | ICD-10-CM | POA: Diagnosis not present

## 2018-10-22 DIAGNOSIS — R51 Headache: Secondary | ICD-10-CM | POA: Diagnosis not present

## 2018-10-22 DIAGNOSIS — R10819 Abdominal tenderness, unspecified site: Secondary | ICD-10-CM | POA: Diagnosis not present

## 2018-10-22 DIAGNOSIS — Z8571 Personal history of Hodgkin lymphoma: Secondary | ICD-10-CM | POA: Diagnosis not present

## 2018-10-22 DIAGNOSIS — L929 Granulomatous disorder of the skin and subcutaneous tissue, unspecified: Secondary | ICD-10-CM | POA: Diagnosis not present

## 2018-10-22 DIAGNOSIS — R200 Anesthesia of skin: Secondary | ICD-10-CM | POA: Diagnosis not present

## 2018-10-22 DIAGNOSIS — S32019A Unspecified fracture of first lumbar vertebra, initial encounter for closed fracture: Secondary | ICD-10-CM | POA: Diagnosis not present

## 2018-10-22 DIAGNOSIS — M546 Pain in thoracic spine: Secondary | ICD-10-CM | POA: Diagnosis not present

## 2018-10-22 DIAGNOSIS — M545 Low back pain: Secondary | ICD-10-CM | POA: Diagnosis not present

## 2018-10-22 DIAGNOSIS — R109 Unspecified abdominal pain: Secondary | ICD-10-CM | POA: Diagnosis not present

## 2018-10-22 DIAGNOSIS — M479 Spondylosis, unspecified: Secondary | ICD-10-CM | POA: Diagnosis not present

## 2018-10-22 DIAGNOSIS — K439 Ventral hernia without obstruction or gangrene: Secondary | ICD-10-CM | POA: Diagnosis not present

## 2018-10-22 DIAGNOSIS — Y33XXXA Other specified events, undetermined intent, initial encounter: Secondary | ICD-10-CM | POA: Diagnosis not present

## 2018-10-22 DIAGNOSIS — M48061 Spinal stenosis, lumbar region without neurogenic claudication: Secondary | ICD-10-CM | POA: Diagnosis not present

## 2018-10-22 DIAGNOSIS — M79645 Pain in left finger(s): Secondary | ICD-10-CM | POA: Diagnosis not present

## 2018-10-22 DIAGNOSIS — Z7902 Long term (current) use of antithrombotics/antiplatelets: Secondary | ICD-10-CM | POA: Diagnosis not present

## 2018-10-22 DIAGNOSIS — R339 Retention of urine, unspecified: Secondary | ICD-10-CM | POA: Diagnosis not present

## 2018-10-22 DIAGNOSIS — R42 Dizziness and giddiness: Secondary | ICD-10-CM | POA: Diagnosis not present

## 2018-10-22 DIAGNOSIS — S32011A Stable burst fracture of first lumbar vertebra, initial encounter for closed fracture: Secondary | ICD-10-CM | POA: Diagnosis not present

## 2018-10-22 DIAGNOSIS — M542 Cervicalgia: Secondary | ICD-10-CM | POA: Diagnosis not present

## 2018-10-22 DIAGNOSIS — T1490XA Injury, unspecified, initial encounter: Secondary | ICD-10-CM | POA: Diagnosis not present

## 2018-10-22 DIAGNOSIS — F419 Anxiety disorder, unspecified: Secondary | ICD-10-CM | POA: Diagnosis not present

## 2018-10-22 DIAGNOSIS — Y9289 Other specified places as the place of occurrence of the external cause: Secondary | ICD-10-CM | POA: Diagnosis not present

## 2018-10-22 DIAGNOSIS — I700 Atherosclerosis of aorta: Secondary | ICD-10-CM | POA: Diagnosis not present

## 2018-10-22 DIAGNOSIS — R140 Abdominal distension (gaseous): Secondary | ICD-10-CM | POA: Diagnosis not present

## 2018-10-22 DIAGNOSIS — E785 Hyperlipidemia, unspecified: Secondary | ICD-10-CM | POA: Diagnosis not present

## 2018-10-22 DIAGNOSIS — F329 Major depressive disorder, single episode, unspecified: Secondary | ICD-10-CM | POA: Diagnosis not present

## 2018-10-23 DIAGNOSIS — R140 Abdominal distension (gaseous): Secondary | ICD-10-CM | POA: Diagnosis not present

## 2018-10-23 DIAGNOSIS — S32010A Wedge compression fracture of first lumbar vertebra, initial encounter for closed fracture: Secondary | ICD-10-CM | POA: Diagnosis not present

## 2018-10-23 DIAGNOSIS — S6992XA Unspecified injury of left wrist, hand and finger(s), initial encounter: Secondary | ICD-10-CM | POA: Diagnosis not present

## 2018-10-23 DIAGNOSIS — W19XXXA Unspecified fall, initial encounter: Secondary | ICD-10-CM | POA: Diagnosis not present

## 2018-11-24 DIAGNOSIS — F3132 Bipolar disorder, current episode depressed, moderate: Secondary | ICD-10-CM | POA: Diagnosis not present

## 2018-11-24 DIAGNOSIS — F411 Generalized anxiety disorder: Secondary | ICD-10-CM | POA: Diagnosis not present

## 2018-11-26 DIAGNOSIS — N390 Urinary tract infection, site not specified: Secondary | ICD-10-CM | POA: Diagnosis not present

## 2018-11-26 DIAGNOSIS — B86 Scabies: Secondary | ICD-10-CM | POA: Diagnosis not present

## 2018-12-04 DIAGNOSIS — Z8781 Personal history of (healed) traumatic fracture: Secondary | ICD-10-CM | POA: Diagnosis not present

## 2018-12-09 ENCOUNTER — Ambulatory Visit
Admission: RE | Admit: 2018-12-09 | Discharge: 2018-12-09 | Disposition: A | Discharge status: Home / Self Care | Payer: Medicare Other | Source: Ambulatory Visit | Attending: Physician Assistant | Admitting: Physician Assistant

## 2018-12-09 ENCOUNTER — Other Ambulatory Visit: Payer: Self-pay

## 2018-12-09 ENCOUNTER — Ambulatory Visit
Admission: RE | Admit: 2018-12-09 | Discharge: 2018-12-09 | Disposition: A | Discharge status: Home / Self Care | Payer: Medicare Other | Attending: Physician Assistant | Admitting: Physician Assistant

## 2018-12-09 ENCOUNTER — Other Ambulatory Visit: Payer: Self-pay | Admitting: Physician Assistant

## 2018-12-09 DIAGNOSIS — Z8781 Personal history of (healed) traumatic fracture: Secondary | ICD-10-CM

## 2018-12-09 DIAGNOSIS — M4856XA Collapsed vertebra, not elsewhere classified, lumbar region, initial encounter for fracture: Secondary | ICD-10-CM | POA: Diagnosis not present

## 2018-12-11 ENCOUNTER — Ambulatory Visit (INDEPENDENT_AMBULATORY_CARE_PROVIDER_SITE_OTHER): Payer: Medicare Other | Admitting: Family Medicine

## 2018-12-11 ENCOUNTER — Other Ambulatory Visit: Payer: Self-pay

## 2018-12-11 ENCOUNTER — Encounter: Payer: Self-pay | Admitting: Family Medicine

## 2018-12-11 VITALS — BP 128/80 | HR 88 | Temp 98.1°F | Resp 16 | Ht 65.0 in | Wt 176.3 lb

## 2018-12-11 DIAGNOSIS — F329 Major depressive disorder, single episode, unspecified: Secondary | ICD-10-CM | POA: Diagnosis not present

## 2018-12-11 DIAGNOSIS — F419 Anxiety disorder, unspecified: Secondary | ICD-10-CM

## 2018-12-11 DIAGNOSIS — Z13220 Encounter for screening for lipoid disorders: Secondary | ICD-10-CM

## 2018-12-11 DIAGNOSIS — F319 Bipolar disorder, unspecified: Secondary | ICD-10-CM | POA: Diagnosis not present

## 2018-12-11 DIAGNOSIS — E1165 Type 2 diabetes mellitus with hyperglycemia: Secondary | ICD-10-CM | POA: Diagnosis not present

## 2018-12-11 DIAGNOSIS — C8110 Nodular sclerosis classical Hodgkin lymphoma, unspecified site: Secondary | ICD-10-CM | POA: Diagnosis not present

## 2018-12-11 DIAGNOSIS — N390 Urinary tract infection, site not specified: Secondary | ICD-10-CM | POA: Diagnosis not present

## 2018-12-11 DIAGNOSIS — E1169 Type 2 diabetes mellitus with other specified complication: Secondary | ICD-10-CM | POA: Insufficient documentation

## 2018-12-11 DIAGNOSIS — S32010A Wedge compression fracture of first lumbar vertebra, initial encounter for closed fracture: Secondary | ICD-10-CM | POA: Insufficient documentation

## 2018-12-11 DIAGNOSIS — S32010D Wedge compression fracture of first lumbar vertebra, subsequent encounter for fracture with routine healing: Secondary | ICD-10-CM

## 2018-12-11 DIAGNOSIS — Z794 Long term (current) use of insulin: Secondary | ICD-10-CM | POA: Diagnosis not present

## 2018-12-11 DIAGNOSIS — E785 Hyperlipidemia, unspecified: Secondary | ICD-10-CM | POA: Insufficient documentation

## 2018-12-11 DIAGNOSIS — Z1159 Encounter for screening for other viral diseases: Secondary | ICD-10-CM

## 2018-12-11 DIAGNOSIS — F32A Depression, unspecified: Secondary | ICD-10-CM | POA: Insufficient documentation

## 2018-12-11 DIAGNOSIS — Z114 Encounter for screening for human immunodeficiency virus [HIV]: Secondary | ICD-10-CM | POA: Diagnosis not present

## 2018-12-11 NOTE — Progress Notes (Signed)
Name: Victoria Austin   MRN: [id number]    DOB: 1950/12/14   Date:12/11/2018       Progress Note  Subjective  Chief Complaint  Chief Complaint  Patient presents with  . Establish Care  . Back Pain    seen at [facility] with kidney infection. Had 2 rounds of antibiotic    HPI  Pt presents to establish care and for the following concerns:  Back Pain: She fell in March 2020 and was diagnosed with compression fracture of the spine.  She has been seeing Ortho - last visit was 12/04/2018.  She has been doing a lot of sitting lately and feels very stiff, is also having pain that shoots into the BLE R>L. She is taking muscle relaxer and gabapentin without issues.  Her Xray from 12/09/2018 and there is a L1 compression fx - MRI is recommended for further evaluation.  Will recommend she continue follow up with spine specialty for these orders.   Ongoing UTI:  She was also diagnosed with pyelo 2 weeks ago at a [facility] and was given Macrobid - this did not help her symptoms, so they sent in bactrim, and she takes her last pill this evening.  She reports ongoing incontinence, dysuria, does have some episodes of constipation.  She denies abdominal pain, fevers or chills, body aches (aside from low back pain from fracture), NVD.  She has history of incontinence, also has history of urinary catheter for over a year, but no longer uses this.  She is taking myrbetric.  She is diabetic - BG's have been running in the 300's.  She used to see an endocrinologist prior to moving from [place] recently.  Currently taking Lantus 60-80 units based on her evening BG.  Novolog sliding scale - 150-200 - 12 units, each additional 50 BG's she will add 2 units.  She used to take Metformin but was taken off due to diarrhea.  No family history of thyroid cancer that she knows of, but will check regarding her sister who had some thyroid issues many years ago; no history of pancreatitis.   Patient Active Problem List   Diagnosis Date  Noted  . Type 2 diabetes mellitus with hyperglycemia, with long-term current use of insulin ([place]) 12/11/2018  . Compression fracture of L1 lumbar vertebra ([place]) 12/11/2018  . 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 Anxiety   . Depression   . Bipolar 1 disorder ([place]) 07/29/2004    Past Surgical History:  Procedure Laterality Date  . BACK SURGERY    . CESAREAN SECTION    . ELBOW ARTHROSCOPY    . FINGER SURGERY    . HAND SURGERY    . HERNIA REPAIR    . SPINE SURGERY      History reviewed. No pertinent family history.  Social History   Socioeconomic History  . Marital status: Legally Separated    Spouse name: Not on file  . Number of children: 2  . Years of education: Not on file  . Highest education level: Not on file  Occupational History  . Occupation: retired  Scientific laboratory technician  . Financial resource strain: Not hard at all  . Food insecurity:    Worry: Never true    Inability: Never true  . Transportation needs:    Medical: No    Non-medical: No  Tobacco Use  . Smoking status: Never Smoker  . Smokeless tobacco: Never Used  Substance and Sexual Activity  .  Alcohol use: Never    Frequency: Never  . Drug use: Never  . Sexual activity: Not Currently    Partners: Male  Lifestyle  . Physical activity:    Days per week: 0 days    Minutes per session: 0 min  . Stress: Very much  Relationships  . Social connections:    Talks on phone: More than three times a week    Gets together: More than three times a week    Attends religious service: Never    Active member of club or organization: No    Attends meetings of clubs or organizations: Never    Relationship status: Separated  . Intimate partner violence:    Fear of current or ex partner: No    Emotionally abused: No    Physically abused: No    Forced sexual activity: No  Other Topics Concern  . Not on file  Social History Narrative  . Not on file     Current  Outpatient Medications:  .  ADVAIR HFA 230-21 MCG/ACT inhaler, Take 1 Inhaler by mouth daily., Disp: , Rfl:  .  albuterol (PROAIR HFA) 108 (90 Base) MCG/ACT inhaler, Take 1 puff by mouth 2 (two) times daily as needed., Disp: , Rfl:  .  buPROPion (WELLBUTRIN) 100 MG tablet, Take 100 mg by mouth daily., Disp: , Rfl:  .  carbamazepine (TEGRETOL) 200 MG tablet, Take 1 tablet by mouth 3 (three) times daily., Disp: , Rfl:  .  cloNIDine (CATAPRES) 0.1 MG tablet, Take 0.1 mg by mouth daily., Disp: , Rfl:  .  clopidogrel (PLAVIX) 75 MG tablet, Take 1 tablet by mouth daily., Disp: , Rfl:  .  DULoxetine (CYMBALTA) 60 MG capsule, Take 1 capsule by mouth daily., Disp: , Rfl:  .  EPINEPHrine (EPIPEN 2-PAK) 0.3 mg/0.3 mL IJ SOAJ injection, Inject 1 application as directed once., Disp: , Rfl:  .  gabapentin (NEURONTIN) 300 MG capsule, Take 1 capsule by mouth daily., Disp: , Rfl:  .  gemfibrozil (LOPID) 600 MG tablet, Take 1 tablet by mouth 2 (two) times daily., Disp: , Rfl:  .  hydrALAZINE (APRESOLINE) 50 MG tablet, Take 1 tablet by mouth 3 (three) times daily., Disp: , Rfl:  .  Insulin Glargine (LANTUS SOLOSTAR) 100 UNIT/ML Solostar Pen, Inject 60 Units as directed once., Disp: , Rfl:  .  lisinopril (ZESTRIL) 5 MG tablet, Take 1 tablet by mouth once., Disp: , Rfl:  .  loratadine (CLARITIN) 10 MG tablet, Take 1 tablet by mouth daily., Disp: , Rfl:  .  LORazepam (ATIVAN) 1 MG tablet, Take 1 tablet by mouth daily., Disp: , Rfl:  .  MAGNESIUM-OXIDE 400 (241.3 Mg) MG tablet, Take 1 Dose by mouth daily., Disp: , Rfl:  .  meclizine (ANTIVERT) 25 MG tablet, Take 1 tablet by mouth 3 (three) times daily., Disp: , Rfl:  .  methocarbamol (ROBAXIN) 750 MG tablet, Take 1 tablet by mouth 2 (two) times daily., Disp: , Rfl:  .  montelukast (SINGULAIR) 10 MG tablet, Take 1 tablet by mouth daily., Disp: , Rfl:  .  MYRBETRIQ 25 MG TB24 tablet, Take 1 tablet by mouth once., Disp: , Rfl:  .  NIFEdipine (ADALAT CC) 60 MG 24 hr  tablet, Take 1 tablet by mouth 2 (two) times daily., Disp: , Rfl:  .  NOVOLOG FLEXPEN 100 UNIT/ML FlexPen, Inject 14 Units as directed 3 (three) times daily., Disp: , Rfl:  .  omega-3 acid ethyl esters (LOVAZA) 1 g capsule, Take  4 capsules by mouth daily., Disp: , Rfl:  .  pantoprazole (PROTONIX) 40 MG tablet, Take 1 tablet by mouth daily., Disp: , Rfl:  .  primidone (MYSOLINE) 250 MG tablet, Take 4 tablets by mouth daily., Disp: , Rfl:  .  promethazine (PHENERGAN) 25 MG tablet, Take 1 tablet by mouth 2 (two) times a day., Disp: , Rfl:  .  propranolol (INDERAL) 10 MG tablet, Take 1 tablet by mouth 2 (two) times a day., Disp: , Rfl:  .  SURE COMFORT PEN NEEDLES 31G X 5 MM MISC, Inject 1 pen as directed 5 (five) times daily as needed., Disp: , Rfl:  .  traZODone (DESYREL) 50 MG tablet, Take 1 tablet by mouth at bedtime., Disp: , Rfl:  .  Vitamin D, Ergocalciferol, (DRISDOL) 1.25 MG (50000 UT) CAPS capsule, Take 1 capsule by mouth once a week., Disp: , Rfl:   Allergies  Allergen Reactions  . Ciprofloxacin Nausea And Vomiting  . Cyclobenzaprine Nausea And Vomiting  . Diazepam Nausea And Vomiting  . Ketorolac     Other reaction(s): Unknown  . Mushroom Extract Complex   . Neomycin-Polymyxin-Gramicidin   . Ranitidine Hcl   . Ondansetron Nausea And Vomiting    I personally reviewed active problem list, medication list, allergies, notes from last encounter, lab results with the patient/caregiver today.   ROS Ten systems reviewed and is negative except as mentioned in HPI  Objective  Vitals:   12/11/18 0905  BP: 128/80  Pulse: 88  Resp: 16  Temp: 98.1 F (36.7 C)  TempSrc: Oral  SpO2: 96%  Weight: 176 lb 4.8 oz (80 kg)  Height: 5\' 5"  (1.651 m)    Body mass index is 29.34 kg/m.  Physical Exam Constitutional: Patient appears well-developed and well-nourished. No distress.  HENT: Head: Normocephalic and atraumatic. Eyes: Conjunctivae and EOM are normal. No scleral icterus.    Neck: Normal range of motion. Neck supple. No JVD present. No thyromegaly present.  Cardiovascular: Normal rate, regular rhythm and normal heart sounds.  No murmur heard. No BLE edema. Pulmonary/Chest: Effort normal and breath sounds normal. No respiratory distress. Abdominal: Soft. Bowel sounds are normal, no distension. There is no tenderness. No masses. UTA CVA tenderness due to back brace in place Musculoskeletal: Normal range of motion, no joint effusions. No gross deformities Neurological: Pt is alert and oriented to person, place, and time. No cranial nerve deficit. Coordination, balance, strength, speech and gait are normal.  Skin: Skin is warm and dry. No rash noted. No erythema.  Psychiatric: Patient has a normal mood and affect. behavior is normal. Judgment and thought content normal.  No results found for this or any previous visit (from the past 72 hour(s)).  PHQ2/9: Depression screen PHQ 2/9 12/11/2018  Decreased Interest 2  Down, Depressed, Hopeless 2  PHQ - 2 Score 4  Altered sleeping 0  Tired, decreased energy 0  Change in appetite 1  Feeling bad or failure about yourself  2  Trouble concentrating 2  Moving slowly or fidgety/restless 1  Suicidal thoughts 2  PHQ-9 Score 12  Difficult doing work/chores Somewhat difficult   PHQ-2/9 Result is positive.    Fall Risk: Fall Risk  12/11/2018  Falls in the past year? 1  Number falls in past yr: 1  Injury with Fall? 1  Risk for fall due to : History of fall(s)  Follow up Falls evaluation completed    Assessment & Plan  1. Urinary tract infection in female - Ambulatory referral  to Urology - COMPLETE METABOLIC PANEL WITH GFR - CBC with Differential/Platelet  2. Compression fracture of L1 vertebra with routine healing, subsequent encounter - Keep up with Spine Specialty  3. Type 2 diabetes mellitus with hyperglycemia, with long-term current use of insulin ([facility]) - Sliding scale, keep lantus at 80 units daily.  Will  check labs and consider adding GLP-1 once she can confirm family history  - COMPLETE METABOLIC PANEL WITH GFR - Hemoglobin A1c  4. Recurrent UTI - Ambulatory referral to Urology - COMPLETE METABOLIC PANEL WITH GFR - CBC with Differential/Platelet  5. Lipid screening - Lipid panel  6. Nodular sclerosing Hodgkin's lymphoma, unspecified body region [facility]) - Referral to hematology,. Request records - CBC with Differential/Platelet - Ambulatory referral to Hematology  7. Need for hepatitis C screening test - Hepatitis C antibody  8. Encounter for screening for HIV - HIV Antibody (routine testing w rflx)  9. [place] - Ambulatory referral to Psychiatry  10. Bipolar 1 disorder ([place]) - Ambulatory referral to Psychiatry  11. Depression, unspecified depression type - Ambulatory referral to Psychiatry

## 2018-12-11 NOTE — Patient Instructions (Addendum)
Please find out if you have family history of thyroid cancer, and if so, which type.   Diabetes Mellitus and Nutrition, Adult When you have diabetes (diabetes mellitus), it is very important to have healthy eating habits because your blood sugar (glucose) levels are greatly affected by what you eat and drink. Eating healthy foods in the appropriate amounts, at about the same times every day, can help you:  Control your blood glucose.  Lower your risk of heart disease.  Improve your blood pressure.  Reach or maintain a healthy weight. Every person with diabetes is different, and each person has different needs for a meal plan. Your health care provider may recommend that you work with a diet and nutrition specialist (dietitian) to make a meal plan that is best for you. Your meal plan may vary depending on factors such as:  The calories you need.  The medicines you take.  Your weight.  Your blood glucose, blood pressure, and cholesterol levels.  Your activity level.  Other health conditions you have, such as heart or kidney disease. How do carbohydrates affect me? Carbohydrates, also called carbs, affect your blood glucose level more than any other type of food. Eating carbs naturally raises the amount of glucose in your blood. Carb counting is a method for keeping track of how many carbs you eat. Counting carbs is important to keep your blood glucose at a healthy level, especially if you use insulin or take certain oral diabetes medicines. It is important to know how many carbs you can safely have in each meal. This is different for every person. Your dietitian can help you calculate how many carbs you should have at each meal and for each snack. Foods that contain carbs include:  Bread, cereal, rice, pasta, and crackers.  Potatoes and corn.  Peas, beans, and lentils.  Milk and yogurt.  Fruit and juice.  Desserts, such as cakes, cookies, ice cream, and candy. How does alcohol  affect me? Alcohol can cause a sudden decrease in blood glucose (hypoglycemia), especially if you use insulin or take certain oral diabetes medicines. Hypoglycemia can be a life-threatening condition. Symptoms of hypoglycemia (sleepiness, dizziness, and confusion) are similar to symptoms of having too much alcohol. If your health care provider says that alcohol is safe for you, follow these guidelines:  Limit alcohol intake to no more than 1 drink per day for nonpregnant women and 2 drinks per day for men. One drink equals 12 oz of beer, 5 oz of wine, or 1 oz of hard liquor.  Do not drink on an empty stomach.  Keep yourself hydrated with water, diet soda, or unsweetened iced tea.  Keep in mind that regular soda, juice, and other mixers may contain a lot of sugar and must be counted as carbs. What are tips for following this plan?  Reading food labels  Start by checking the serving size on the "Nutrition Facts" label of packaged foods and drinks. The amount of calories, carbs, fats, and other nutrients listed on the label is based on one serving of the item. Many items contain more than one serving per package.  Check the total grams (g) of carbs in one serving. You can calculate the number of servings of carbs in one serving by dividing the total carbs by 15. For example, if a food has 30 g of total carbs, it would be equal to 2 servings of carbs.  Check the number of grams (g) of saturated and trans fats in  one serving. Choose foods that have low or no amount of these fats.  Check the number of milligrams (mg) of salt (sodium) in one serving. Most people should limit total sodium intake to less than 2,300 mg per day.  Always check the nutrition information of foods labeled as "low-fat" or "nonfat". These foods may be higher in added sugar or refined carbs and should be avoided.  Talk to your dietitian to identify your daily goals for nutrients listed on the label. Shopping  Avoid buying  canned, premade, or processed foods. These foods tend to be high in fat, sodium, and added sugar.  Shop around the outside edge of the grocery store. This includes fresh fruits and vegetables, bulk grains, fresh meats, and fresh dairy. Cooking  Use low-heat cooking methods, such as baking, instead of high-heat cooking methods like deep frying.  Cook using healthy oils, such as olive, canola, or sunflower oil.  Avoid cooking with butter, cream, or high-fat meats. Meal planning  Eat meals and snacks regularly, preferably at the same times every day. Avoid going long periods of time without eating.  Eat foods high in fiber, such as fresh fruits, vegetables, beans, and whole grains. Talk to your dietitian about how many servings of carbs you can eat at each meal.  Eat 4-6 ounces (oz) of lean protein each day, such as lean meat, chicken, fish, eggs, or tofu. One oz of lean protein is equal to: ? 1 oz of meat, chicken, or fish. ? 1 egg. ?  cup of tofu.  Eat some foods each day that contain healthy fats, such as avocado, nuts, seeds, and fish. Lifestyle  Check your blood glucose regularly.  Exercise regularly as told by your health care provider. This may include: ? 150 minutes of moderate-intensity or vigorous-intensity exercise each week. This could be brisk walking, biking, or water aerobics. ? Stretching and doing strength exercises, such as yoga or weightlifting, at least 2 times a week.  Take medicines as told by your health care provider.  Do not use any products that contain nicotine or tobacco, such as cigarettes and e-cigarettes. If you need help quitting, ask your health care provider.  Work with a Social worker or diabetes educator to identify strategies to manage stress and any emotional and social challenges. Questions to ask a health care provider  Do I need to meet with a diabetes educator?  Do I need to meet with a dietitian?  What number can I call if I have  questions?  When are the best times to check my blood glucose? Where to find more information:  American Diabetes Association: diabetes.org  Academy of Nutrition and Dietetics: www.eatright.[employer] of Diabetes and Digestive and Kidney Diseases (NIH): [employer] Summary  A healthy meal plan will help you control your blood glucose and maintain a healthy lifestyle.  Working with a diet and nutrition specialist (dietitian) can help you make a meal plan that is best for you.  Keep in mind that carbohydrates (carbs) and alcohol have immediate effects on your blood glucose levels. It is important to count carbs and to use alcohol carefully. This information is not intended to replace advice given to you by your health care provider. Make sure you discuss any questions you have with your health care provider. Document Released: 04/11/2005 Document Revised: 02/12/2017 Document Reviewed: 08/19/2016 Elsevier Interactive Patient Education  2019 [employer].

## 2018-12-14 ENCOUNTER — Other Ambulatory Visit: Payer: Self-pay | Admitting: Family Medicine

## 2018-12-14 DIAGNOSIS — E781 Pure hyperglyceridemia: Secondary | ICD-10-CM

## 2018-12-14 DIAGNOSIS — I2510 Atherosclerotic heart disease of native coronary artery without angina pectoris: Secondary | ICD-10-CM

## 2018-12-14 DIAGNOSIS — Z794 Long term (current) use of insulin: Secondary | ICD-10-CM

## 2018-12-14 DIAGNOSIS — E782 Mixed hyperlipidemia: Secondary | ICD-10-CM

## 2018-12-14 DIAGNOSIS — E1165 Type 2 diabetes mellitus with hyperglycemia: Secondary | ICD-10-CM

## 2018-12-14 LAB — CBC WITH DIFFERENTIAL/PLATELET
Absolute Monocytes: 574 cells/uL (ref 200–950)
Basophils Absolute: 70 cells/uL (ref 0–200)
Basophils Relative: 1.2 %
Eosinophils Absolute: 162 cells/uL (ref 15–500)
Eosinophils Relative: 2.8 %
HCT: 33.4 % — ABNORMAL LOW (ref 35.0–45.0)
Hemoglobin: 11.5 g/dL — ABNORMAL LOW (ref 11.7–15.5)
Lymphs Abs: 2268 cells/uL (ref 850–3900)
MCH: 28.6 pg (ref 27.0–33.0)
MCHC: 34.4 g/dL (ref 32.0–36.0)
MCV: 83.1 fL (ref 80.0–100.0)
MPV: 9.8 fL (ref 7.5–12.5)
Monocytes Relative: 9.9 %
Neutro Abs: 2726 cells/uL (ref 1500–7800)
Neutrophils Relative %: 47 %
Platelets: 321 10*3/uL (ref 140–400)
RBC: 4.02 10*6/uL (ref 3.80–5.10)
RDW: 13.8 % (ref 11.0–15.0)
Total Lymphocyte: 39.1 %
WBC: 5.8 10*3/uL (ref 3.8–10.8)

## 2018-12-14 LAB — LIPID PANEL
Cholesterol: 213 mg/dL — ABNORMAL HIGH (ref <200)
HDL: 33 mg/dL — ABNORMAL LOW (ref >=50)
LDL Cholesterol (Calc): 140 mg/dL (calc) — ABNORMAL HIGH
Non-HDL Cholesterol (Calc): 180 mg/dL (calc) — ABNORMAL HIGH (ref <130)
Total CHOL/HDL Ratio: 6.5 (calc) — ABNORMAL HIGH (ref <5.0)
Triglycerides: 260 mg/dL — ABNORMAL HIGH (ref <150)

## 2018-12-14 LAB — COMPLETE METABOLIC PANEL WITH GFR
AG Ratio: 1.3 (calc) (ref 1.0–2.5)
ALT: 7 U/L (ref 6–29)
AST: 15 U/L (ref 10–35)
Albumin: 4.3 g/dL (ref 3.6–5.1)
Alkaline phosphatase (APISO): 117 U/L (ref 37–153)
BUN: 21 mg/dL (ref 7–25)
CO2: 21 mmol/L (ref 20–32)
Calcium: 9.8 mg/dL (ref 8.6–10.4)
Chloride: 103 mmol/L (ref 98–110)
Creat: 0.89 mg/dL (ref 0.50–0.99)
GFR, Est African American: 78 mL/min/{1.73_m2} (ref >=60)
GFR, Est Non African American: 68 mL/min/{1.73_m2} (ref >=60)
Globulin: 3.2 g/dL (calc) (ref 1.9–3.7)
Glucose, Bld: 90 mg/dL (ref 65–99)
Potassium: 4.3 mmol/L (ref 3.5–5.3)
Sodium: 137 mmol/L (ref 135–146)
Total Bilirubin: 0.3 mg/dL (ref 0.2–1.2)
Total Protein: 7.5 g/dL (ref 6.1–8.1)

## 2018-12-14 LAB — HEMOGLOBIN A1C
Hgb A1c MFr Bld: 7.6 % of total Hgb — ABNORMAL HIGH (ref <5.7)
Mean Plasma Glucose: 171 (calc)
eAG (mmol/L): 9.5 (calc)

## 2018-12-14 LAB — HEPATITIS C ANTIBODY
Hepatitis C Ab: NONREACTIVE
SIGNAL TO CUT-OFF: 0.05 (ref <1.00)

## 2018-12-14 LAB — HIV ANTIBODY (ROUTINE TESTING W REFLEX): HIV 1&2 Ab, 4th Generation: NONREACTIVE

## 2018-12-14 MED ORDER — ROSUVASTATIN CALCIUM 20 MG PO TABS: 20.0000 mg | ORAL_TABLET | Freq: Every day | ORAL | 3 refills | Status: DC

## 2018-12-14 MED ORDER — ICOSAPENT ETHYL 1 G PO CAPS: 2.0000 g | ORAL_CAPSULE | Freq: Two times a day (BID) | ORAL | 1 refills | Status: DC

## 2018-12-18 ENCOUNTER — Ambulatory Visit (INDEPENDENT_AMBULATORY_CARE_PROVIDER_SITE_OTHER): Payer: Medicare Other | Admitting: Family Medicine

## 2018-12-18 ENCOUNTER — Other Ambulatory Visit: Payer: Self-pay

## 2018-12-18 ENCOUNTER — Encounter: Payer: Self-pay | Admitting: Family Medicine

## 2018-12-18 VITALS — HR 86 | Temp 99.2°F | Resp 18 | Ht 65.0 in | Wt 181.0 lb

## 2018-12-18 VITALS — BP 128/82 | HR 86 | Temp 99.2°F | Resp 18 | Ht 65.0 in | Wt 181.0 lb

## 2018-12-18 DIAGNOSIS — E1165 Type 2 diabetes mellitus with hyperglycemia: Secondary | ICD-10-CM

## 2018-12-18 DIAGNOSIS — N390 Urinary tract infection, site not specified: Secondary | ICD-10-CM | POA: Diagnosis not present

## 2018-12-18 DIAGNOSIS — Z794 Long term (current) use of insulin: Secondary | ICD-10-CM | POA: Diagnosis not present

## 2018-12-18 DIAGNOSIS — R809 Proteinuria, unspecified: Secondary | ICD-10-CM | POA: Diagnosis not present

## 2018-12-18 LAB — POCT UA - MICROALBUMIN: Microalbumin Ur, POC: 100 mg/L

## 2018-12-18 LAB — POCT URINALYSIS DIPSTICK
Bilirubin, UA: NEGATIVE
Blood, UA: NEGATIVE
Glucose, UA: NEGATIVE
Ketones, UA: POSITIVE
Nitrite, UA: NEGATIVE
Protein, UA: POSITIVE — AB
Spec Grav, UA: 1.02 (ref 1.010–1.025)
Urobilinogen, UA: 0.2 E.U./dL
pH, UA: 6.5 (ref 5.0–8.0)

## 2018-12-18 MED ORDER — SEMAGLUTIDE(0.25 OR 0.5MG/DOS) 2 MG/1.5ML ~~LOC~~ SOPN: PEN_INJECTOR | SUBCUTANEOUS | 1 refills | Status: DC

## 2018-12-18 MED ORDER — FREESTYLE LIBRE 14 DAY READER DEVI: 1.0000 | Freq: Four times a day (QID) | 0 refills | Status: DC

## 2018-12-18 MED ORDER — FREESTYLE LIBRE 14 DAY SENSOR MISC: 1.0000 | 1 refills | Status: DC

## 2018-12-18 NOTE — Progress Notes (Signed)
Name: Victoria Austin   MRN: [id number]    DOB: 11/06/1950   Date:12/22/2018       Progress Note  Subjective  Chief Complaint  Chief Complaint  Patient presents with  . Follow-up  . Urinary Tract Infection  . Diabetes    wants to see about increasing her check for BG to 4x a day?    HPI  Recurrent UTI:  She was also diagnosed with pyelo 3 weeks ago at a [facility] and was given Macrobid - this did not help her symptoms, so they sent in bactrim, completed antibiotic and is now improving.  She reports ongoing incontinence, dysuria, does have some episodes of constipation.  She denies abdominal pain, fevers or chills, body aches (aside from low back pain from fracture), NVD.  She has history of incontinence, also has history of urinary catheter for over a year, but no longer uses this.  She is taking myrbetric.  Referral to urology is in place and appointment is made.   T2DM - BG's have been running in the 300's.  She used to see an endocrinologist prior to moving from [place] recently.  Currently taking Lantus 80 units.  Novolog sliding scale - 150-200 - 12 units, each additional 50 BG's she will add 2 units.  She used to take Metformin but was taken off due to diarrhea.  No family or personal history of thyroid cancer; no history of pancreatitis. She has notable albuminuria today on dipstick - did have nephrologist when she lived in [place], we will refer today.  Fasting blood sugars over the last week - 127-202.  She does have tremor which makes it harder to obtain finger stick BG's (tremor is not new).  We will try to get freestyle libre covered for her.    Patient Active Problem List   Diagnosis Date Noted  . Albuminuria 12/18/2018  . Type 2 diabetes mellitus with hyperglycemia, with long-term current use of insulin ([place]) 12/11/2018  . Compression fracture of L1 lumbar vertebra ([place]) 12/11/2018  . 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 Anxiety    . Depression   . Bipolar 1 disorder ([place]) 07/29/2004    Past Surgical History:  Procedure Laterality Date  . BACK SURGERY    . CESAREAN SECTION    . ELBOW ARTHROSCOPY    . FINGER SURGERY    . HAND SURGERY    . HERNIA REPAIR    . SPINE SURGERY      History reviewed. No pertinent family history.  Social History   Socioeconomic History  . Marital status: Legally Separated    Spouse name: Not on file  . Number of children: 2  . Years of education: Not on file  . Highest education level: Not on file  Occupational History  . Occupation: retired  Scientific laboratory technician  . Financial resource strain: Not hard at all  . Food insecurity:    Worry: Never true    Inability: Never true  . Transportation needs:    Medical: No    Non-medical: No  Tobacco Use  . Smoking status: Never Smoker  . Smokeless tobacco: Never Used  Substance and Sexual Activity  . Alcohol use: Never    Frequency: Never  . Drug use: Never  . Sexual activity: Not Currently    Partners: Male  Lifestyle  . Physical activity:    Days per week: 0 days    Minutes per session: 0 min  . Stress:  Very much  Relationships  . Social connections:    Talks on phone: More than three times a week    Gets together: More than three times a week    Attends religious service: Never    Active member of club or organization: No    Attends meetings of clubs or organizations: Never    Relationship status: Separated  . Intimate partner violence:    Fear of current or ex partner: No    Emotionally abused: No    Physically abused: No    Forced sexual activity: No  Other Topics Concern  . Not on file  Social History Narrative  . Not on file    Current Outpatient Medications:  .  ADVAIR HFA 230-21 MCG/ACT inhaler, Take 1 Inhaler by mouth daily., Disp: , Rfl:  .  albuterol (PROAIR HFA) 108 (90 Base) MCG/ACT inhaler, Take 1 puff by mouth 2 (two) times daily as needed., Disp: , Rfl:  .  buPROPion (WELLBUTRIN) 100 MG tablet, Take  100 mg by mouth daily., Disp: , Rfl:  .  carbamazepine (TEGRETOL) 200 MG tablet, Take 1 tablet by mouth 3 (three) times daily., Disp: , Rfl:  .  cloNIDine (CATAPRES) 0.1 MG tablet, Take 0.1 mg by mouth daily., Disp: , Rfl:  .  clopidogrel (PLAVIX) 75 MG tablet, Take 1 tablet by mouth daily., Disp: , Rfl:  .  DULoxetine (CYMBALTA) 60 MG capsule, Take 1 capsule by mouth daily., Disp: , Rfl:  .  EPINEPHrine (EPIPEN 2-PAK) 0.3 mg/0.3 mL IJ SOAJ injection, Inject 1 application as directed once., Disp: , Rfl:  .  gabapentin (NEURONTIN) 300 MG capsule, Take 1 capsule by mouth daily., Disp: , Rfl:  .  hydrALAZINE (APRESOLINE) 50 MG tablet, Take 1 tablet by mouth 3 (three) times daily., Disp: , Rfl:  .  Insulin Glargine (LANTUS SOLOSTAR) 100 UNIT/ML Solostar Pen, Inject 60 Units as directed once., Disp: , Rfl:  .  lisinopril (ZESTRIL) 5 MG tablet, Take 1 tablet by mouth once., Disp: , Rfl:  .  loratadine (CLARITIN) 10 MG tablet, Take 1 tablet by mouth daily., Disp: , Rfl:  .  LORazepam (ATIVAN) 1 MG tablet, Take 1 tablet by mouth daily., Disp: , Rfl:  .  MAGNESIUM-OXIDE 400 (241.3 Mg) MG tablet, Take 1 Dose by mouth daily., Disp: , Rfl:  .  meclizine (ANTIVERT) 25 MG tablet, Take 1 tablet by mouth 3 (three) times daily., Disp: , Rfl:  .  methocarbamol (ROBAXIN) 750 MG tablet, Take 1 tablet by mouth 2 (two) times daily., Disp: , Rfl:  .  montelukast (SINGULAIR) 10 MG tablet, Take 1 tablet by mouth daily., Disp: , Rfl:  .  MYRBETRIQ 25 MG TB24 tablet, Take 1 tablet by mouth once., Disp: , Rfl:  .  NIFEdipine (ADALAT CC) 60 MG 24 hr tablet, Take 1 tablet by mouth 2 (two) times daily., Disp: , Rfl:  .  NOVOLOG FLEXPEN 100 UNIT/ML FlexPen, Inject 14 Units as directed 3 (three) times daily., Disp: , Rfl:  .  omega-3 acid ethyl esters (LOVAZA) 1 g capsule, Take 4 capsules by mouth daily., Disp: , Rfl:  .  pantoprazole (PROTONIX) 40 MG tablet, Take 1 tablet by mouth daily., Disp: , Rfl:  .  primidone (MYSOLINE)  250 MG tablet, Take 4 tablets by mouth daily., Disp: , Rfl:  .  promethazine (PHENERGAN) 25 MG tablet, Take 1 tablet by mouth 2 (two) times a day., Disp: , Rfl:  .  propranolol (INDERAL) 10 MG  tablet, Take 1 tablet by mouth 2 (two) times a day., Disp: , Rfl:  .  rosuvastatin (CRESTOR) 20 MG tablet, Take 1 tablet (20 mg total) by mouth daily., Disp: 90 tablet, Rfl: 3 .  SURE COMFORT PEN NEEDLES 31G X 5 MM MISC, Inject 1 pen as directed 5 (five) times daily as needed., Disp: , Rfl:  .  traZODone (DESYREL) 50 MG tablet, Take 1 tablet by mouth at bedtime., Disp: , Rfl:  .  Vitamin D, Ergocalciferol, (DRISDOL) 1.25 MG (50000 UT) CAPS capsule, Take 1 capsule by mouth once a week., Disp: , Rfl:  .  Continuous Blood Gluc Receiver (FREESTYLE LIBRE 14 DAY READER) DEVI, 1 Device by Does not apply route 4 (four) times daily., Disp: 1 Device, Rfl: 0 .  Continuous Blood Gluc Sensor (FREESTYLE LIBRE 14 DAY SENSOR) MISC, 1 Device by Does not apply route every 14 (fourteen) days., Disp: 6 each, Rfl: 1 .  Icosapent Ethyl 1 g CAPS, Take 2 capsules (2 g total) by mouth 2 (two) times a day. (Patient not taking: Reported on 12/18/2018), Disp: 360 capsule, Rfl: 1 .  Semaglutide,0.25 or 0.5MG /DOS, (OZEMPIC, 0.25 OR 0.5 MG/DOSE,) 2 MG/1.5ML SOPN, Inject 0.25 mg into the skin once a week for 28 days, THEN 0.5 mg once a week., Disp: 3 pen, Rfl: 1  Allergies  Allergen Reactions  . Ciprofloxacin Nausea And Vomiting  . Cyclobenzaprine Nausea And Vomiting  . Diazepam Nausea And Vomiting  . Ketorolac     Other reaction(s): Unknown  . Mushroom Extract Complex   . Neomycin-Polymyxin-Gramicidin   . Ranitidine Hcl   . Ondansetron Nausea And Vomiting    I personally reviewed active problem list, medication list, allergies, notes from last encounter, lab results with the patient/caregiver today.   ROS  Constitutional: Negative for fever or weight change.  Respiratory: Negative for cough and shortness of breath.    Cardiovascular: Negative for chest pain or palpitations.  Gastrointestinal: Negative for abdominal pain, no bowel changes.  Musculoskeletal: Negative for gait problem or joint swelling. +Back pain ongoing.  Skin: Negative for rash.  Neurological: Negative for dizziness or headache.  No other specific complaints in a complete review of systems (except as listed in HPI above).  Objective  Vitals:   12/18/18 1042  BP: 128/82  Pulse: 86  Resp: 18  Temp: 99.2 F (37.3 C)  TempSrc: Oral  SpO2: 95%  Weight: 181 lb (82.1 kg)  Height: 5\' 5"  (1.651 m)   Body mass index is 30.12 kg/m.  Physical Exam  Constitutional: Patient appears well-developed and well-nourished. No distress.  HENT: Head: Normocephalic and atraumatic. Ears: bilateral TMs with no erythema or effusion; Nose: Nose normal.  Neck: Normal range of motion. Neck supple. Cardiovascular: Normal rate, regular rhythm and normal heart sounds.  No murmur heard. No BLE edema. Pulmonary/Chest: Effort normal and breath sounds normal. No respiratory distress. Abdominal: Soft. Bowel sounds are normal, no distension. There is no tenderness. No masses. Musculoskeletal: Normal range of motion, no joint effusions. No gross deformities Neurological: Pt is alert and oriented to person, place, and time. No cranial nerve deficit. Back brace is worn due to recent compression fracture. Coordination, balance, strength, speech and gait are normal.  Skin: Skin is warm and dry. No rash noted. No erythema.  Psychiatric: Patient has a normal mood and affect. behavior is normal. Judgment and thought content normal.  No results found for this or any previous visit (from the past 72 hour(s)). PHQ2/9: Depression screen PHQ  2/9 12/18/2018 12/11/2018  Decreased Interest 0 2  Down, Depressed, Hopeless 1 2  PHQ - 2 Score 1 4  Altered sleeping 1 0  Tired, decreased energy 0 0  Change in appetite 2 1  Feeling bad or failure about yourself  2 2  Trouble  concentrating 0 2  Moving slowly or fidgety/restless 0 1  Suicidal thoughts 0 2  PHQ-9 Score 6 12  Difficult doing work/chores Somewhat difficult Somewhat difficult   PHQ-2/9 Result is positive.    Fall Risk: Fall Risk  12/18/2018 12/11/2018  Falls in the past year? 1 1  Number falls in past yr: 1 1  Injury with Fall? 1 1  Risk for fall due to : - History of fall(s)  Follow up - Falls evaluation completed   Functional Status Survey: Is the patient deaf or have difficulty hearing?: No Does the patient have difficulty seeing, even when wearing glasses/contacts?: No Does the patient have difficulty concentrating, remembering, or making decisions?: No Does the patient have difficulty walking or climbing stairs?: Yes Does the patient have difficulty dressing or bathing?: No Does the patient have difficulty doing errands alone such as visiting a doctor's office or shopping?: No   Assessment & Plan  1. Type 2 diabetes mellitus with hyperglycemia, with long-term current use of insulin ([facility]) - POCT UA - Microalbumin - Ambulatory referral to Nephrology - Semaglutide,0.25 or 0.5MG /DOS, (OZEMPIC, 0.25 OR 0.5 MG/DOSE,) 2 MG/1.5ML SOPN; Inject 0.25 mg into the skin once a week for 28 days, THEN 0.5 mg once a week.  Dispense: 3 pen; Refill: 1 - Continuous Blood Gluc Receiver (FREESTYLE LIBRE 14 DAY READER) DEVI; 1 Device by Does not apply route 4 (four) times daily.  Dispense: 1 Device; Refill: 0 - Continuous Blood Gluc Sensor (FREESTYLE LIBRE 14 DAY SENSOR) MISC; 1 Device by Does not apply route every 14 (fourteen) days.  Dispense: 6 each; Refill: 1 - Ambulatory referral to Chronic Care Management Services  2. Urinary tract infection in female - POCT urinalysis dipstick - Urine Culture  3. Albuminuria - Ambulatory referral to Nephrology - Urine Culture

## 2018-12-18 NOTE — Patient Instructions (Signed)
Follow your sliding scale insulin carefully after starting Ozempic - you will likely see a decrease in your blood sugars.

## 2018-12-20 LAB — URINE CULTURE
MICRO NUMBER:: 501058
SPECIMEN QUALITY:: ADEQUATE

## 2018-12-21 ENCOUNTER — Other Ambulatory Visit: Payer: Self-pay

## 2018-12-22 ENCOUNTER — Inpatient Hospital Stay: Payer: Medicare Other

## 2018-12-22 ENCOUNTER — Encounter: Payer: Self-pay | Admitting: *Deleted

## 2018-12-22 ENCOUNTER — Inpatient Hospital Stay: Payer: Medicare Other | Attending: Oncology | Admitting: Oncology

## 2018-12-22 ENCOUNTER — Ambulatory Visit: Payer: Self-pay | Admitting: *Deleted

## 2018-12-22 ENCOUNTER — Encounter: Payer: Self-pay | Admitting: Oncology

## 2018-12-22 ENCOUNTER — Other Ambulatory Visit: Payer: Self-pay

## 2018-12-22 VITALS — BP 157/87 | HR 69 | Temp 96.2°F | Resp 18 | Ht 65.0 in | Wt 182.9 lb

## 2018-12-22 DIAGNOSIS — Z794 Long term (current) use of insulin: Secondary | ICD-10-CM

## 2018-12-22 DIAGNOSIS — Z8572 Personal history of non-Hodgkin lymphomas: Secondary | ICD-10-CM

## 2018-12-22 DIAGNOSIS — Z8579 Personal history of other malignant neoplasms of lymphoid, hematopoietic and related tissues: Secondary | ICD-10-CM

## 2018-12-22 DIAGNOSIS — E1165 Type 2 diabetes mellitus with hyperglycemia: Secondary | ICD-10-CM

## 2018-12-22 DIAGNOSIS — Z9221 Personal history of antineoplastic chemotherapy: Secondary | ICD-10-CM

## 2018-12-22 DIAGNOSIS — F329 Major depressive disorder, single episode, unspecified: Secondary | ICD-10-CM

## 2018-12-22 DIAGNOSIS — F32A Depression, unspecified: Secondary | ICD-10-CM

## 2018-12-22 DIAGNOSIS — R809 Proteinuria, unspecified: Secondary | ICD-10-CM

## 2018-12-22 LAB — LACTATE DEHYDROGENASE: LDH: 199 U/L — ABNORMAL HIGH (ref 98–192)

## 2018-12-22 LAB — COMPREHENSIVE METABOLIC PANEL
ALT: 9 U/L (ref 0–44)
AST: 25 U/L (ref 15–41)
Albumin: 4.2 g/dL (ref 3.5–5.0)
Alkaline Phosphatase: 98 U/L (ref 38–126)
Anion gap: 9 (ref 5–15)
BUN: 17 mg/dL (ref 8–23)
CO2: 25 mmol/L (ref 22–32)
Calcium: 8.9 mg/dL (ref 8.9–10.3)
Chloride: 101 mmol/L (ref 98–111)
Creatinine, Ser: 0.99 mg/dL (ref 0.44–1.00)
GFR calc Af Amer: >60 mL/min (ref >60)
GFR calc non Af Amer: 59 mL/min — ABNORMAL LOW (ref >60)
Glucose, Bld: 112 mg/dL — ABNORMAL HIGH (ref 70–99)
Potassium: 4.1 mmol/L (ref 3.5–5.1)
Sodium: 135 mmol/L (ref 135–145)
Total Bilirubin: 0.6 mg/dL (ref 0.3–1.2)
Total Protein: 8.1 g/dL (ref 6.5–8.1)

## 2018-12-22 LAB — CBC WITH DIFFERENTIAL/PLATELET
Abs Immature Granulocytes: 0.04 10*3/uL (ref 0.00–0.07)
Basophils Absolute: 0.1 10*3/uL (ref 0.0–0.1)
Basophils Relative: 1 %
Eosinophils Absolute: 0.2 10*3/uL (ref 0.0–0.5)
Eosinophils Relative: 3 %
HCT: 32.4 % — ABNORMAL LOW (ref 36.0–46.0)
Hemoglobin: 10.8 g/dL — ABNORMAL LOW (ref 12.0–15.0)
Immature Granulocytes: 1 %
Lymphocytes Relative: 34 %
Lymphs Abs: 2 10*3/uL (ref 0.7–4.0)
MCH: 28.3 pg (ref 26.0–34.0)
MCHC: 33.3 g/dL (ref 30.0–36.0)
MCV: 85 fL (ref 80.0–100.0)
Monocytes Absolute: 0.5 10*3/uL (ref 0.1–1.0)
Monocytes Relative: 8 %
Neutro Abs: 3.1 10*3/uL (ref 1.7–7.7)
Neutrophils Relative %: 53 %
Platelets: 264 10*3/uL (ref 150–400)
RBC: 3.81 MIL/uL — ABNORMAL LOW (ref 3.87–5.11)
RDW: 13.1 % (ref 11.5–15.5)
WBC: 5.8 10*3/uL (ref 4.0–10.5)
nRBC: 0 % (ref 0.0–0.2)

## 2018-12-22 LAB — SEDIMENTATION RATE: Sed Rate: 52 mm/hr — ABNORMAL HIGH (ref 0–30)

## 2018-12-22 LAB — URIC ACID: Uric Acid, Serum: 6.7 mg/dL (ref 2.5–7.1)

## 2018-12-22 NOTE — Progress Notes (Signed)
Patient here to establish care. Pt states she has been stressed and depressed, no thoughts of harming self. Pt had a fall in march where she hit head and injured back, currently wearing back brace.

## 2018-12-22 NOTE — Chronic Care Management (AMB) (Signed)
  Chronic Care Management    General Note  12/22/2018 Name: Parul Porcelli MRN: [id number] DOB: 1950/10/21  Melaysia Streed is a 68 y.o. year old female who is a primary care patient of Hubbard Hartshorn, FNP. The CCM was consulted to assist the patient with Medication accessibility  Per patient, she would like assistance with obtaining the free style Libre.   Patient states that she is currently  being followed by a psychiatrist and therapist in  [facility], however will be seen virtually until she is connected with providers locally. Per patient, she was referred to a local office that wanted $150.00 upfront. Patient unable to afford this. Patient was referred to another  provider of whom she agrees to call and schedule.   Ms. Parzych was given information about Chronic Care Management services today including:  1. CCM service includes personalized support from designated clinical staff supervised by her physician, including individualized plan of care and coordination with other care providers 2. 24/7 contact phone numbers for assistance for urgent and routine care needs. 3. Service will only be billed when office clinical staff spend 20 minutes or more in a month to coordinate care. 4. Only one practitioner may furnish and bill the service in a calendar month. 5. The patient may stop CCM services at any time (effective at the end of the month) by phone call to the office staff. 6. The patient will be responsible for cost sharing (co-pay) of up to 20% of the service fee (after annual deductible is met).  Patient agreed to services and verbal consent obtained.   Review of patient status, including review of consultants reports, relevant laboratory and other test results, and collaboration with appropriate care team members and the patient's provider was performed as part of comprehensive patient evaluation and provision of chronic care management services.    SDOH (Social Determinants of Health)  screening performed today. See Care Plan Entry related to challenges with: Stress Physical Activity  Goals Addressed   None      Follow Up Plan: Appointment scheduled for Pharmacist follow up with client by phone on: 12/30/2018        Elliot Gurney, [place] Worker  [facility] [phone]

## 2018-12-23 NOTE — Progress Notes (Signed)
Patient stated she is feeling better. Will call back if any symptoms.

## 2018-12-25 ENCOUNTER — Other Ambulatory Visit: Payer: Self-pay | Admitting: Emergency Medicine

## 2018-12-25 ENCOUNTER — Telehealth: Payer: Self-pay | Admitting: Family Medicine

## 2018-12-25 NOTE — Telephone Encounter (Signed)
Copied from [place] [id number]. Topic: Quick Communication - Rx Refill/Question >> Dec 25, 2018 11:23 AM Mathis Bud wrote: Medication: MAGNESIUM-OXIDE 400 (241.3 Mg) MG tablet NOW Patient is stating she is going to need all of the medications refill 6/11. Can PCP get in touch with patient. Call back # [phone]  Has the patient contacted their pharmacy? New pharmacy  Preferred Pharmacy (with phone number or street name): [facility]: [address], [place], [place] [address]  Agent: Please be advised that RX refills may take up to 3 business days. We ask that you follow-up with your pharmacy.

## 2018-12-25 NOTE — Telephone Encounter (Signed)
Order sent to [facility]

## 2018-12-26 DIAGNOSIS — Z7189 Other specified counseling: Secondary | ICD-10-CM | POA: Insufficient documentation

## 2018-12-28 ENCOUNTER — Other Ambulatory Visit
Admission: RE | Admit: 2018-12-28 | Discharge: 2018-12-28 | Disposition: A | Discharge status: Home / Self Care | Payer: Medicare Other | Source: Ambulatory Visit | Attending: Ophthalmology | Admitting: Ophthalmology

## 2018-12-28 DIAGNOSIS — M316 Other giant cell arteritis: Secondary | ICD-10-CM | POA: Insufficient documentation

## 2018-12-28 DIAGNOSIS — E119 Type 2 diabetes mellitus without complications: Secondary | ICD-10-CM | POA: Diagnosis not present

## 2018-12-28 LAB — SEDIMENTATION RATE: Sed Rate: 41 mm/hr — ABNORMAL HIGH (ref 0–30)

## 2018-12-28 LAB — C-REACTIVE PROTEIN: CRP: <0.8 mg/dL (ref <1.0)

## 2018-12-28 NOTE — Telephone Encounter (Signed)
Did you refill this patients refill when she was here

## 2018-12-28 NOTE — Telephone Encounter (Signed)
Pt called stating she is a new pt with [facility] and needing all 26 of her RXs that we updated in her chart to be sent to the pharmacy for her.  Pt asking that someone please call to notify her when sent. She also wanted to mention that she has had such pleasant encounters with everyone at [facility] and wanted to say thank you.   DOD Ruma, [place] [address] [phone] (Phone) [phone] (Fax)

## 2018-12-28 NOTE — Telephone Encounter (Signed)
Please call patient and review medications - confirm which she is taking, which are prescribed by specialist (confirm nephrology referral has been made).  Please Que up medications

## 2018-12-28 NOTE — Progress Notes (Signed)
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 Oncology notes and pathology reports were not available to me. Denies weight loss, fever, chills, fatigue, night sweats.   Patient reports being quite stressed recently and depressed.  No thoughts of harming herself. March 2020, patient  L1 burst fracture after mechanical ground-level fall at a doctor's office.  Currently wearing back brace.   Review of Systems  Constitutional: Negative for appetite change, chills, fatigue and fever.  HENT:   Negative for hearing loss and voice change.   Eyes: Negative for eye problems.  Respiratory: Negative for chest tightness and cough.   Cardiovascular: Negative for chest pain.  Gastrointestinal: Negative for abdominal distention, abdominal pain and blood in stool.  Endocrine: Negative for hot flashes.  Genitourinary: Negative for difficulty urinating and frequency.   Musculoskeletal: Positive for back pain. Negative for arthralgias.  Skin: Negative for itching and rash.  Neurological: Negative for extremity weakness.  Hematological: Negative for adenopathy.  Psychiatric/Behavioral: Positive for depression. Negative for confusion. The patient is nervous/anxious.     MEDICAL HISTORY:  Past Medical History:  Diagnosis Date  . Anxiety   . Arthritis   . Bipolar 1 disorder ([place]) 2006  . Depression   . Diabetes mellitus without complication  ([place])   . Fibromyalgia   . GERD (gastroesophageal reflux disease)   . Hodgkin's lymphoma ([place])   . Hyperlipidemia   . IBS (irritable bowel syndrome)   . Incontinence   . RA (rheumatoid arthritis) ([place])     SURGICAL HISTORY: Past Surgical History:  Procedure Laterality Date  . BACK SURGERY    . CESAREAN SECTION    . ELBOW ARTHROSCOPY    . FINGER SURGERY    . HAND SURGERY    . HERNIA REPAIR    . SPINE SURGERY      SOCIAL HISTORY: Social History   Socioeconomic History  . Marital status: Legally Separated    Spouse name: Not on file  . Number of children: 2  . Years of education: Not on file  . Highest education level: Not on file  Occupational History  . Occupation: retired  Scientific laboratory technician  . Financial resource strain: Not hard at all  . Food insecurity:    Worry: Never true    Inability: Never true  . Transportation needs:    Medical: No    Non-medical: No  Tobacco Use  . Smoking status: Never Smoker  . Smokeless tobacco: Never Used  Substance and Sexual Activity  . Alcohol use: Never    Frequency: Never  . Drug use: Never  . Sexual activity: Not Currently    Partners: Male  Lifestyle  . Physical activity:    Days per week: 0 days    Minutes per session: 0 min  . Stress: Very much  Relationships  . Social connections:    Talks on phone: More than three times a week    Gets together: More than three times a week  Attends religious service: Never    Active member of club or organization: No    Attends meetings of clubs or organizations: Never    Relationship status: Separated  . Intimate partner violence:    Fear of current or ex partner: No    Emotionally abused: No    Physically abused: No    Forced sexual activity: No  Other Topics Concern  . Not on file  Social History Narrative  . Not on file    FAMILY HISTORY: Family History  Problem Relation Age of Onset  . Lung cancer Mother   . Hypertension Mother   . Hypertension Sister   .  Hypertension Brother   . Bone cancer Maternal Aunt   . Colon cancer Maternal Uncle     ALLERGIES:  is allergic to ciprofloxacin; cyclobenzaprine; diazepam; ketorolac; mushroom extract complex; neomycin-polymyxin-gramicidin; ranitidine hcl; and ondansetron.  MEDICATIONS:  Current Outpatient Medications  Medication Sig Dispense Refill  . ADVAIR HFA 230-21 MCG/ACT inhaler Take 1 Inhaler by mouth daily.    Marland Kitchen albuterol (PROAIR HFA) 108 (90 Base) MCG/ACT inhaler Take 1 puff by mouth 2 (two) times daily as needed.    Marland Kitchen buPROPion (WELLBUTRIN) 100 MG tablet Take 100 mg by mouth daily.    . carbamazepine (TEGRETOL) 200 MG tablet Take 1 tablet by mouth 3 (three) times daily.    . cloNIDine (CATAPRES) 0.1 MG tablet Take 0.1 mg by mouth daily.    . clopidogrel (PLAVIX) 75 MG tablet Take 1 tablet by mouth daily.    . Continuous Blood Gluc Receiver (FREESTYLE LIBRE 14 DAY READER) DEVI 1 Device by Does not apply route 4 (four) times daily. 1 Device 0  . Continuous Blood Gluc Sensor (FREESTYLE LIBRE 14 DAY SENSOR) MISC 1 Device by Does not apply route every 14 (fourteen) days. 6 each 1  . DULoxetine (CYMBALTA) 60 MG capsule Take 1 capsule by mouth daily.    Marland Kitchen EPINEPHrine (EPIPEN 2-PAK) 0.3 mg/0.3 mL IJ SOAJ injection Inject 1 application as directed once.    . gabapentin (NEURONTIN) 300 MG capsule Take 1 capsule by mouth daily.    . hydrALAZINE (APRESOLINE) 50 MG tablet Take 1 tablet by mouth 3 (three) times daily.    . Insulin Glargine (LANTUS SOLOSTAR) 100 UNIT/ML Solostar Pen Inject 60 Units as directed once.    Marland Kitchen lisinopril (ZESTRIL) 5 MG tablet Take 1 tablet by mouth once.    . loratadine (CLARITIN) 10 MG tablet Take 1 tablet by mouth daily.    Marland Kitchen LORazepam (ATIVAN) 1 MG tablet Take 1 tablet by mouth daily.    Marland Kitchen MAGNESIUM-OXIDE 400 (241.3 Mg) MG tablet Take 1 Dose by mouth daily.    . meclizine (ANTIVERT) 25 MG tablet Take 1 tablet by mouth 3 (three) times daily.    . methocarbamol (ROBAXIN) 750 MG  tablet Take 1 tablet by mouth 2 (two) times daily.    . montelukast (SINGULAIR) 10 MG tablet Take 1 tablet by mouth daily.    Marland Kitchen MYRBETRIQ 25 MG TB24 tablet Take 1 tablet by mouth once.    Marland Kitchen NIFEdipine (ADALAT CC) 60 MG 24 hr tablet Take 1 tablet by mouth 2 (two) times daily.    Marland Kitchen NOVOLOG FLEXPEN 100 UNIT/ML FlexPen Inject 14 Units as directed 3 (three) times daily.    Marland Kitchen omega-3 acid ethyl esters (LOVAZA) 1 g capsule Take 4 capsules by mouth daily.    . pantoprazole (PROTONIX) 40 MG tablet Take 1 tablet by mouth daily.    Marland Kitchen  primidone (MYSOLINE) 250 MG tablet Take 4 tablets by mouth daily.    . promethazine (PHENERGAN) 25 MG tablet Take 1 tablet by mouth 2 (two) times a day.    . propranolol (INDERAL) 10 MG tablet Take 1 tablet by mouth 2 (two) times a day.    . rosuvastatin (CRESTOR) 20 MG tablet Take 1 tablet (20 mg total) by mouth daily. 90 tablet 3  . SURE COMFORT PEN NEEDLES 31G X 5 MM MISC Inject 1 pen as directed 5 (five) times daily as needed.    . traZODone (DESYREL) 50 MG tablet Take 1 tablet by mouth at bedtime.    . Vitamin D, Ergocalciferol, (DRISDOL) 1.25 MG (50000 UT) CAPS capsule Take 1 capsule by mouth once a week.    . Semaglutide,0.25 or 0.5MG/DOS, (OZEMPIC, 0.25 OR 0.5 MG/DOSE,) 2 MG/1.5ML SOPN Inject 0.25 mg into the skin once a week for 28 days, THEN 0.5 mg once a week. (Patient not taking: Reported on 12/22/2018) 3 pen 1   No current facility-administered medications for this visit.      PHYSICAL EXAMINATION:  ECOG PERFORMANCE STATUS: 1 - Symptomatic but completely ambulatory Vitals:   12/22/18 1110  BP: (!) 157/87  Pulse: 69  Resp: 18  Temp: (!) 96.2 F (35.7 C)   Filed Weights   12/22/18 1110  Weight: 182 lb 14.4 oz (83 kg)    Physical Exam Constitutional:      General: She is not in acute distress. HENT:     Head: Normocephalic and atraumatic.  Eyes:     General: No scleral icterus.    Pupils: Pupils are equal, round, and reactive to light.  Neck:      Musculoskeletal: Normal range of motion and neck supple.  Cardiovascular:     Rate and Rhythm: Normal rate and regular rhythm.     Heart sounds: Normal heart sounds.  Pulmonary:     Effort: Pulmonary effort is normal. No respiratory distress.     Breath sounds: No wheezing.  Abdominal:     General: Bowel sounds are normal. There is no distension.     Palpations: Abdomen is soft. There is no mass.     Tenderness: There is no abdominal tenderness.  Musculoskeletal: Normal range of motion.        General: No deformity.  Skin:    General: Skin is warm and dry.     Findings: No erythema or rash.  Neurological:     Mental Status: She is alert and oriented to person, place, and time.     Cranial Nerves: No cranial nerve deficit.     Coordination: Coordination normal.  Psychiatric:     Comments: Tearful     LABORATORY DATA:  I have reviewed the data as listed Lab Results  Component Value Date   WBC 5.8 12/22/2018   HGB 10.8 (L) 12/22/2018   HCT 32.4 (L) 12/22/2018   MCV 85.0 12/22/2018   PLT 264 12/22/2018   Recent Labs    12/11/18 1020 12/22/18 1153  NA 137 135  K 4.3 4.1  CL 103 101  CO2 21 25  GLUCOSE 90 112*  BUN 21 17  CREATININE 0.89 0.99  CALCIUM 9.8 8.9  GFRNONAA 68 59*  GFRAA 78 >60  PROT 7.5 8.1  ALBUMIN  --  4.2  AST 15 25  ALT 7 9  ALKPHOS  --  98  BILITOT 0.3 0.6   Iron/TIBC/Ferritin/ %Sat No results found for: IRON, TIBC, FERRITIN, IRONPCTSAT  RADIOGRAPHIC STUDIES: I have personally reviewed the radiological images as listed and agreed with the findings in the report.  Dg Scoliosis Eval Complete Spine 2 Or 3 Views  Result Date: 12/09/2018 CLINICAL DATA:  Scoliosis. EXAM: DG SCOLIOSIS EVAL COMPLETE SPINE 2-3V COMPARISON:  None. FINDINGS: 5 degrees of dextroscoliosis is seen involving upper thoracic spine centered at T4 level. 4 degrees of levoscoliosis is seen involving the lower thoracic and upper lumbar spine. Acute to subacute moderate  compression fracture of L1 vertebral body is noted. Osteophyte formation is seen involving the mid and lower thoracic spine. IMPRESSION: Minimal S-shaped scoliosis as described above. Acute to subacute L1 compression fracture is noted. MRI may be performed for further evaluation. Aortic Atherosclerosis (ICD10-I70.0). Electronically Signed   By: Marijo Conception M.D.   On: 12/09/2018 16:07      ASSESSMENT & PLAN:  1. History of lymphoma   2. Depression, unspecified depression type    #History of nodular sclerosing Hodgkin's lymphoma status post chemotherapy. Currently no B symptoms. Check CBC, CMP, LDH, ESR, uric acid will obtain past medical history from the previous cancer center in [place].  Depression, patient takes Wellbutrin, clonidine, Cymbalta.  Reports having a lot of stress in his life.  No thoughts of hurting herself.  Orders Placed This Encounter  Procedures  . CBC with Differential/Platelet    Standing Status:   Future    Number of Occurrences:   1    Standing Expiration Date:   12/22/2019  . Comprehensive metabolic panel    Standing Status:   Future    Number of Occurrences:   1    Standing Expiration Date:   12/22/2019  . Lactate dehydrogenase    Standing Status:   Future    Number of Occurrences:   1    Standing Expiration Date:   12/22/2019  . Uric acid    Standing Status:   Future    Number of Occurrences:   1    Standing Expiration Date:   01/22/2019  . Sedimentation rate    Standing Status:   Future    Number of Occurrences:   1    Standing Expiration Date:   12/22/2019    All questions were answered. The patient knows to call the clinic with any problems questions or concerns.  cc Hubbard Hartshorn, FNP    Return of visit: 2-3 weeks.  Thank you for this kind referral and the opportunity to participate in the care of this patient. A copy of today's note is routed to referring provider  Total face to face encounter time for this patient visit was 45 min. >50%  of the time was  spent in counseling and coordination of care.    Earlie Server, MD, PhD Hematology Oncology [facility] Pager- [phone] 12/28/2018

## 2018-12-29 ENCOUNTER — Other Ambulatory Visit: Payer: Self-pay | Admitting: Family Medicine

## 2018-12-29 ENCOUNTER — Other Ambulatory Visit: Payer: Self-pay

## 2018-12-29 ENCOUNTER — Telehealth: Payer: Self-pay | Admitting: Family Medicine

## 2018-12-29 ENCOUNTER — Emergency Department: Payer: Medicare Other

## 2018-12-29 ENCOUNTER — Emergency Department
Admission: EM | Admit: 2018-12-29 | Discharge: 2018-12-29 | Disposition: A | Discharge status: Home / Self Care | Payer: Medicare Other | Attending: Emergency Medicine | Admitting: Emergency Medicine

## 2018-12-29 DIAGNOSIS — S0083XA Contusion of other part of head, initial encounter: Secondary | ICD-10-CM | POA: Diagnosis not present

## 2018-12-29 DIAGNOSIS — Y999 Unspecified external cause status: Secondary | ICD-10-CM | POA: Diagnosis not present

## 2018-12-29 DIAGNOSIS — S199XXA Unspecified injury of neck, initial encounter: Secondary | ICD-10-CM | POA: Diagnosis not present

## 2018-12-29 DIAGNOSIS — E119 Type 2 diabetes mellitus without complications: Secondary | ICD-10-CM | POA: Diagnosis not present

## 2018-12-29 DIAGNOSIS — S32019A Unspecified fracture of first lumbar vertebra, initial encounter for closed fracture: Secondary | ICD-10-CM | POA: Diagnosis not present

## 2018-12-29 DIAGNOSIS — Z794 Long term (current) use of insulin: Secondary | ICD-10-CM | POA: Insufficient documentation

## 2018-12-29 DIAGNOSIS — M549 Dorsalgia, unspecified: Secondary | ICD-10-CM | POA: Diagnosis not present

## 2018-12-29 DIAGNOSIS — S3993XA Unspecified injury of pelvis, initial encounter: Secondary | ICD-10-CM | POA: Diagnosis not present

## 2018-12-29 DIAGNOSIS — M79642 Pain in left hand: Secondary | ICD-10-CM | POA: Diagnosis not present

## 2018-12-29 DIAGNOSIS — Y92009 Unspecified place in unspecified non-institutional (private) residence as the place of occurrence of the external cause: Secondary | ICD-10-CM | POA: Insufficient documentation

## 2018-12-29 DIAGNOSIS — S0003XA Contusion of scalp, initial encounter: Secondary | ICD-10-CM

## 2018-12-29 DIAGNOSIS — S0990XA Unspecified injury of head, initial encounter: Secondary | ICD-10-CM

## 2018-12-29 DIAGNOSIS — W07XXXA Fall from chair, initial encounter: Secondary | ICD-10-CM | POA: Insufficient documentation

## 2018-12-29 DIAGNOSIS — I10 Essential (primary) hypertension: Secondary | ICD-10-CM | POA: Insufficient documentation

## 2018-12-29 DIAGNOSIS — S6992XA Unspecified injury of left wrist, hand and finger(s), initial encounter: Secondary | ICD-10-CM | POA: Diagnosis not present

## 2018-12-29 DIAGNOSIS — S32010A Wedge compression fracture of first lumbar vertebra, initial encounter for closed fracture: Secondary | ICD-10-CM

## 2018-12-29 DIAGNOSIS — M542 Cervicalgia: Secondary | ICD-10-CM | POA: Diagnosis not present

## 2018-12-29 DIAGNOSIS — R52 Pain, unspecified: Secondary | ICD-10-CM | POA: Diagnosis not present

## 2018-12-29 DIAGNOSIS — E1165 Type 2 diabetes mellitus with hyperglycemia: Secondary | ICD-10-CM

## 2018-12-29 DIAGNOSIS — Z79899 Other long term (current) drug therapy: Secondary | ICD-10-CM | POA: Diagnosis not present

## 2018-12-29 DIAGNOSIS — M546 Pain in thoracic spine: Secondary | ICD-10-CM | POA: Diagnosis not present

## 2018-12-29 DIAGNOSIS — G44309 Post-traumatic headache, unspecified, not intractable: Secondary | ICD-10-CM | POA: Diagnosis not present

## 2018-12-29 DIAGNOSIS — T68XXXA Hypothermia, initial encounter: Secondary | ICD-10-CM | POA: Diagnosis not present

## 2018-12-29 DIAGNOSIS — Y9389 Activity, other specified: Secondary | ICD-10-CM | POA: Insufficient documentation

## 2018-12-29 DIAGNOSIS — S3992XA Unspecified injury of lower back, initial encounter: Secondary | ICD-10-CM | POA: Diagnosis not present

## 2018-12-29 DIAGNOSIS — M25532 Pain in left wrist: Secondary | ICD-10-CM | POA: Diagnosis not present

## 2018-12-29 DIAGNOSIS — W19XXXA Unspecified fall, initial encounter: Secondary | ICD-10-CM | POA: Diagnosis not present

## 2018-12-29 DIAGNOSIS — S299XXA Unspecified injury of thorax, initial encounter: Secondary | ICD-10-CM | POA: Diagnosis not present

## 2018-12-29 MED ORDER — NIFEDIPINE ER 60 MG PO TB24: 60.0000 mg | ORAL_TABLET | Freq: Two times a day (BID) | ORAL | 1 refills | Status: DC

## 2018-12-29 MED ORDER — HYDROCODONE-ACETAMINOPHEN 5-325 MG PO TABS
1.0000 | ORAL_TABLET | ORAL | Status: AC
  Administered 2018-12-29: 21:00:00 1 via ORAL
  Filled 2018-12-29: qty 1

## 2018-12-29 MED ORDER — SEMAGLUTIDE(0.25 OR 0.5MG/DOS) 2 MG/1.5ML ~~LOC~~ SOPN: 0.5000 mg | PEN_INJECTOR | SUBCUTANEOUS | 1 refills | Status: DC

## 2018-12-29 MED ORDER — HYDROCODONE-ACETAMINOPHEN 5-325 MG PO TABS: 1.0000 | ORAL_TABLET | ORAL | 0 refills | Status: DC | PRN

## 2018-12-29 NOTE — Telephone Encounter (Signed)
Pt called to inform Raquel Sarna that her fathers sister had her thyroids removed due to thyroid cancer. Pt's first cousin had thyroid cancer and this is information for the Rx ozempic

## 2018-12-29 NOTE — Discharge Instructions (Signed)
Please take Tylenol as needed for mild pain.  You may use Norco as needed for moderate to severe pain.  Please continue with TLSO brace.  Make sure you are taking stool softeners to prevent constipation.  Please wear thumb spica brace until you follow-up with orthopedics.  Call orthopedic office tomorrow morning to schedule follow-up appointment.

## 2018-12-29 NOTE — Telephone Encounter (Signed)
Meds sent

## 2018-12-29 NOTE — Telephone Encounter (Signed)
Pt.notified

## 2018-12-29 NOTE — ED Notes (Signed)
ED Provider at bedside. 

## 2018-12-29 NOTE — Telephone Encounter (Signed)
Copied from [place] [id number]. Topic: Quick Communication - Rx Refill/Question >> Dec 29, 2018  9:56 AM Margot Ables wrote: Medication: NIFEdipine (ADALAT CC) 60 MG 24 hr tablet & Semaglutide,0.25 or 0.5MG /DOS, (OZEMPIC, 0.25 OR 0.5 MG/DOSE,) 2 MG/1.5ML SOPN   Please notify pt when resolved so she can drive down to pick up.  Has the patient contacted their pharmacy? Yes - pt was told there is a drug interaction between the two medications and they need clarification from the doctor  Preferred Pharmacy (with phone number or street name): [facility], [place] - [address] [phone] (Phone) [phone] (Fax)

## 2018-12-29 NOTE — ED Triage Notes (Signed)
Pt arrived via [facility] from home with a fall. Pt fell backwards out of a chair and hit the base of her head and her neck and is c/o of pain those areas. Pt has prior L1 injury. Pt is also on blood thinner.

## 2018-12-29 NOTE — Telephone Encounter (Signed)
Copied from [place] [id number]. Topic: General - Other >> Dec 29, 2018 10:31 AM Lennox Solders wrote: Reason for CRM:pt is calling and she needs new rxs  free style libra 14 day reader and sensor. Pt is using [place] [facility] phone number [phone]. Pt said pharm also needs CGM form.

## 2018-12-29 NOTE — ED Provider Notes (Signed)
[facility] EMERGENCY DEPARTMENT Provider Note   CSN: [id number] Arrival date & time: 12/29/18  1939    History   Chief Complaint Chief Complaint  Patient presents with   Fall    HPI Victoria Austin is a 68 y.o. female presents to the emergency department via EMS for evaluation of fall at home.  Patient went to sit down on a chair, chair did not have a back and she fell backwards onto her buttocks hit her head and neck on a shelf.  She is complaining of headache, neck pain, mid and low back pain.  Recently suffered L1 compression deformity and has been discussing possible kyphoplasty procedure.  No numbness tingling radicular symptoms in the upper lower extremities.  She also complaining of left wrist and hand pain.  No other lower extremity discomfort.  Pain is moderate.  No LOC, nausea or vomiting.  No chest pain or shortness of breath.        HPI  Past Medical History:  Diagnosis Date   Anxiety    Arthritis    Bipolar 1 disorder ([place]) 2006   Depression    Diabetes mellitus without complication (HCC)    Fibromyalgia    GERD (gastroesophageal reflux disease)    Hodgkin's lymphoma (HCC)    Hyperlipidemia    IBS (irritable bowel syndrome)    Incontinence    RA (rheumatoid arthritis) ([place])     Patient Active Problem List   Diagnosis Date Noted   Goals of care, counseling/discussion 12/26/2018   Albuminuria 12/18/2018   Type 2 diabetes mellitus with hyperglycemia, with long-term current use of insulin ([place]) 12/11/2018   Compression fracture of L1 lumbar vertebra ([place]) 12/11/2018   Recurrent UTI 12/11/2018   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      Home Medications    Prior to Admission medications   Medication Sig Start Date End Date Taking? Authorizing Provider  ADVAIR HFA 230-21 MCG/ACT inhaler Take 1 Inhaler by mouth daily. 07/10/18   [provider]  albuterol (PROAIR HFA) 108 (90 Base) MCG/ACT inhaler Take 1 puff by mouth 2 (two) times daily as needed. 04/27/18   [provider]  buPROPion (WELLBUTRIN) 100 MG tablet Take 100 mg by mouth daily. 07/10/18   [provider]  carbamazepine (TEGRETOL) 200 MG tablet Take 1 tablet by mouth 3 (three) times daily. 04/27/18   [provider]  cloNIDine (CATAPRES) 0.1 MG tablet Take 0.1 mg by mouth daily. 04/27/18   [provider]  clopidogrel (PLAVIX) 75 MG tablet Take 1 tablet by mouth daily. 04/27/18   [provider]  Continuous Blood Gluc Receiver (FREESTYLE LIBRE 14 DAY READER) DEVI 1 Device by Does not apply route 4 (four) times daily. 12/18/18   Hubbard Hartshorn, FNP  Continuous Blood Gluc Sensor (FREESTYLE LIBRE 14 DAY SENSOR) MISC 1 Device by Does not apply route every 14 (fourteen) days. 12/18/18   Hubbard Hartshorn, FNP  DULoxetine (CYMBALTA) 60 MG capsule Take 1 capsule by mouth daily. 04/23/18  [provider]  EPINEPHrine (EPIPEN 2-PAK) 0.3 mg/0.3 mL IJ SOAJ injection Inject 1 application as directed once. 04/23/18   [provider]  gabapentin (NEURONTIN) 300 MG capsule Take 1 capsule by mouth daily. 12/04/18   [provider]  hydrALAZINE (APRESOLINE) 50 MG tablet Take 1 tablet by mouth 3 (three) times daily. 04/23/18   [provider]  HYDROcodone-acetaminophen (NORCO) 5-325 MG tablet Take 1 tablet by mouth every 4 (four) hours as needed for moderate pain. 12/29/18   Duanne Guess, PA-C  Insulin Glargine (LANTUS SOLOSTAR) 100 UNIT/ML Solostar Pen Inject 60 Units as directed once. 04/27/18   [provider]  lisinopril (ZESTRIL) 5 MG  tablet Take 1 tablet by mouth once. 05/26/18   [provider]  loratadine (CLARITIN) 10 MG tablet Take 1 tablet by mouth daily. 04/27/18   [provider]  LORazepam (ATIVAN) 1 MG tablet Take 1 tablet by mouth daily. 06/15/18   [provider]  MAGNESIUM-OXIDE 400 (241.3 Mg) MG tablet Take 1 Dose by mouth daily. 06/15/18   [provider]  meclizine (ANTIVERT) 25 MG tablet Take 1 tablet by mouth 3 (three) times daily. 10/07/18   [provider]  methocarbamol (ROBAXIN) 750 MG tablet Take 1 tablet by mouth 2 (two) times daily. 10/23/18   [provider]  montelukast (SINGULAIR) 10 MG tablet Take 1 tablet by mouth daily. 08/25/18   [provider]  MYRBETRIQ 25 MG TB24 tablet Take 1 tablet by mouth once. 10/08/18   [provider]  NIFEdipine (ADALAT CC) 60 MG 24 hr tablet Take 1 tablet (60 mg total) by mouth 2 (two) times daily. 12/29/18   Poulose, Bethel Born, NP  NOVOLOG FLEXPEN 100 UNIT/ML FlexPen Inject 14 Units as directed 3 (three) times daily. 07/10/18   [provider]  omega-3 acid ethyl esters (LOVAZA) 1 g capsule Take 4 capsules by mouth daily. 12/07/18   [provider]  pantoprazole (PROTONIX) 40 MG tablet Take 1 tablet by mouth daily. 04/23/18   [provider]  primidone (MYSOLINE) 250 MG tablet Take 4 tablets by mouth daily. 07/10/18   [provider]  promethazine (PHENERGAN) 25 MG tablet Take 1 tablet by mouth 2 (two) times a day. 07/09/18   [provider]  propranolol (INDERAL) 10 MG tablet Take 1 tablet by mouth 2 (two) times a day. 04/23/18   [provider]  rosuvastatin (CRESTOR) 20 MG tablet Take 1 tablet (20 mg total) by mouth daily. 12/14/18   Hubbard Hartshorn, FNP  Semaglutide,0.25 or 0.5MG /DOS, (OZEMPIC, 0.25 OR 0.5 MG/DOSE,) 2 MG/1.5ML SOPN Inject 0.5 mg into the skin once a week. 12/29/18 03/29/19  Poulose, Bethel Born, NP  SURE COMFORT PEN NEEDLES 31G X 5 MM  MISC Inject 1 pen as directed 5 (five) times daily as needed. 07/10/18   [provider]  traZODone (DESYREL) 50 MG tablet Take 1 tablet by mouth at bedtime. 04/27/18   [provider]  Vitamin D, Ergocalciferol, (DRISDOL) 1.25 MG (50000 UT) CAPS capsule Take 1 capsule by mouth once a week. 04/23/18   [provider]    Family History Family History  Problem Relation Age of Onset   Lung cancer Mother    Hypertension Mother    Hypertension Sister    Hypertension Brother    Bone cancer Maternal Aunt    Colon cancer Maternal Uncle     Social History Social History   Tobacco Use   Smoking status: Never  Smoker   Smokeless tobacco: Never Used  Substance Use Topics   Alcohol use: Never    Frequency: Never   Drug use: Never     Allergies   Ciprofloxacin; Cyclobenzaprine; Diazepam; Ketorolac; Mushroom extract complex; Neomycin-polymyxin-gramicidin; Ranitidine hcl; and Ondansetron   Review of Systems Review of Systems  Constitutional: Negative for activity change.  Eyes: Negative for pain and visual disturbance.  Respiratory: Negative for shortness of breath.   Cardiovascular: Negative for chest pain and leg swelling.  Gastrointestinal: Negative for abdominal pain.  Genitourinary: Negative for flank pain and pelvic pain.  Musculoskeletal: Positive for arthralgias, back pain and neck pain. Negative for gait problem, joint swelling, myalgias and neck stiffness.  Skin: Negative for wound.  Neurological: Positive for headaches. Negative for dizziness, syncope, weakness, light-headedness and numbness.  Psychiatric/Behavioral: Negative for confusion and decreased concentration.     Physical Exam Updated Vital Signs BP (!) 174/108 (BP Location: Left Arm)    Pulse 79    Temp 97.8 F (36.6 C) (Oral)    Resp 20    Ht 5\' 5"  (1.651 m)    Wt 83 kg    SpO2 97%    BMI 30.44 kg/m   Physical Exam Constitutional:      Appearance: She is well-developed.   HENT:     Head: Normocephalic and atraumatic.     Right Ear: External ear normal.     Left Ear: External ear normal.     Nose: Nose normal.  Eyes:     Extraocular Movements: Extraocular movements intact.     Conjunctiva/sclera: Conjunctivae normal.     Pupils: Pupils are equal, round, and reactive to light.  Neck:     Musculoskeletal: Normal range of motion.  Cardiovascular:     Rate and Rhythm: Normal rate.  Pulmonary:     Effort: Pulmonary effort is normal. No respiratory distress.     Breath sounds: Normal breath sounds.  Abdominal:     Palpations: Abdomen is soft.     Tenderness: There is no abdominal tenderness.  Musculoskeletal:        General: Tenderness present.     Comments: Patient tender along the cervical spinous process as well as the lumbar spinous process.  She has mild paravertebral muscle tenderness along the thoracic and lumbosacral junction.  She has tenderness to the left wrist joint along the radiocarpal region into the dorsal aspect of the carpals of the left hand.  Good range of motion shoulders and elbows bilaterally with no discomfort.  Good range of motion of the hips knees and ankles with no pain with palpation or range of motion.  Skin:    General: Skin is warm and dry.     Findings: No rash.  Neurological:     General: No focal deficit present.     Mental Status: She is alert and oriented to person, place, and time. Mental status is at baseline.     Cranial Nerves: No cranial nerve deficit.     Sensory: No sensory deficit.     Motor: No weakness.     Coordination: Coordination normal.     Gait: Gait normal.  Psychiatric:        Mood and Affect: Mood normal.        Behavior: Behavior normal.        Thought Content: Thought content normal.      ED Treatments / Results  Labs (all labs ordered are listed, but only abnormal results are displayed) Labs Reviewed -  No data to display  EKG None  Radiology Dg Thoracic Spine 2 View  Result Date:  12/29/2018 CLINICAL DATA:  [place] from a chair.  Back pain. EXAM: THORACIC SPINE 2 VIEWS COMPARISON:  Radiographs 12/09/2018 FINDINGS: Normal alignment of the thoracic vertebral bodies. Moderate stable degenerative changes but no definite acute fracture. The visualized posterior ribs are intact. Stable L1 compression fracture noted. IMPRESSION: Normal alignment and no acute bony findings. Stable degenerative changes. Electronically Signed   By: Marijo Sanes M.D.   On: 12/29/2018 20:49   Dg Lumbar Spine 2-3 Views  Result Date: 12/29/2018 CLINICAL DATA:  [place].  Back pain. EXAM: LUMBAR SPINE - 2-3 VIEW COMPARISON:  Radiographs 12/09/2018 FINDINGS: L1 compression fractures again demonstrated with mild retropulsion of the posterosuperior aspect of the vertebral body. No acute lumbar compression fracture is identified. The facets are normally aligned. Moderate facet disease but no pars defects. Stable vascular calcifications. IMPRESSION: Stable L1 compression fracture. No definite new compression fractures. Electronically Signed   By: Marijo Sanes M.D.   On: 12/29/2018 20:51   Dg Pelvis 1-2 Views  Result Date: 12/29/2018 CLINICAL DATA:  [place] from a chair. EXAM: PELVIS - 1-2 VIEW COMPARISON:  None. FINDINGS: Both hips are normally located. No definite hip fractures. The pubic symphysis and SI joints are intact. No definite pelvic fractures. IMPRESSION: No acute bony findings. Electronically Signed   By: Marijo Sanes M.D.   On: 12/29/2018 20:52   Dg Wrist Complete Left  Result Date: 12/29/2018 CLINICAL DATA:  [place].  Left wrist pain. EXAM: LEFT WRIST - COMPLETE 3+ VIEW COMPARISON:  None. FINDINGS: No acute fractures identified. There is widening of the scapholunate joint space which could suggest a ligament injury. Mild degenerative changes involving the wrist joint and carpometacarpal joint of the thumb. IMPRESSION: 1. No acute fracture. 2. Widened scapholunate joint space could suggest a ligament injury.  Electronically Signed   By: Marijo Sanes M.D.   On: 12/29/2018 20:54   Ct Head Wo Contrast  Result Date: 12/29/2018 CLINICAL DATA:  Posttraumatic headache and neck pain after fall. EXAM: CT HEAD WITHOUT CONTRAST CT CERVICAL SPINE WITHOUT CONTRAST TECHNIQUE: Multidetector CT imaging of the head and cervical spine was performed following the standard protocol without intravenous contrast. Multiplanar CT image reconstructions of the cervical spine were also generated. COMPARISON:  None. FINDINGS: CT HEAD FINDINGS Brain: Mild chronic ischemic white matter disease is noted. No mass effect or midline shift is noted. Ventricular size is within normal limits. There is no evidence of mass lesion, hemorrhage or acute infarction. Vascular: No hyperdense vessel or unexpected calcification. Skull: Normal. Negative for fracture or focal lesion. Sinuses/Orbits: No acute finding. Other: None. CT CERVICAL SPINE FINDINGS Alignment: Normal. Skull base and vertebrae: No acute fracture. No primary bone lesion or focal pathologic process. Soft tissues and spinal canal: No prevertebral fluid or swelling. No visible canal hematoma. Disc levels: Moderate degenerative disc disease is noted at C6-7 and C7-T1 with anterior osteophyte formation. Upper chest: Negative. Other: Degenerative changes are seen involving posterior facet joints bilaterally. IMPRESSION: Mild chronic ischemic white matter disease. No acute intracranial abnormality seen. Moderate multilevel degenerative disc disease. No acute abnormality seen in the cervical spine. Electronically Signed   By: Marijo Conception M.D.   On: 12/29/2018 20:52   Ct Cervical Spine Wo Contrast  Result Date: 12/29/2018 CLINICAL DATA:  Posttraumatic headache and neck pain after fall. EXAM: CT HEAD WITHOUT CONTRAST CT CERVICAL SPINE WITHOUT CONTRAST TECHNIQUE: Multidetector CT imaging of  the head and cervical spine was performed following the standard protocol without intravenous contrast.  Multiplanar CT image reconstructions of the cervical spine were also generated. COMPARISON:  None. FINDINGS: CT HEAD FINDINGS Brain: Mild chronic ischemic white matter disease is noted. No mass effect or midline shift is noted. Ventricular size is within normal limits. There is no evidence of mass lesion, hemorrhage or acute infarction. Vascular: No hyperdense vessel or unexpected calcification. Skull: Normal. Negative for fracture or focal lesion. Sinuses/Orbits: No acute finding. Other: None. CT CERVICAL SPINE FINDINGS Alignment: Normal. Skull base and vertebrae: No acute fracture. No primary bone lesion or focal pathologic process. Soft tissues and spinal canal: No prevertebral fluid or swelling. No visible canal hematoma. Disc levels: Moderate degenerative disc disease is noted at C6-7 and C7-T1 with anterior osteophyte formation. Upper chest: Negative. Other: Degenerative changes are seen involving posterior facet joints bilaterally. IMPRESSION: Mild chronic ischemic white matter disease. No acute intracranial abnormality seen. Moderate multilevel degenerative disc disease. No acute abnormality seen in the cervical spine. Electronically Signed   By: Marijo Conception M.D.   On: 12/29/2018 20:52   Dg Hand Complete Left  Result Date: 12/29/2018 CLINICAL DATA:  [place].  Left hand pain. EXAM: LEFT HAND - COMPLETE 3+ VIEW COMPARISON:  None. FINDINGS: The joint spaces are fairly well maintained. There are mild degenerative changes mainly at the DIP and PIP joints of the fingers. No acute fractures identified. IMPRESSION: Degenerative changes but no acute bony findings. Electronically Signed   By: Marijo Sanes M.D.   On: 12/29/2018 20:54    Procedures Procedures (including critical care time)  Medications Ordered in ED Medications  HYDROcodone-acetaminophen (NORCO/VICODIN) 5-325 MG per tablet 1 tablet (1 tablet Oral Given 12/29/18 2100)     Initial Impression / Assessment and Plan / ED Course  I have  reviewed the triage vital signs and the nursing notes.  Pertinent labs & imaging results that were available during my care of the patient were reviewed by me and considered in my medical decision making (see chart for details).        68 year old female with fall earlier today.  Patient fell backwards off of a chair, hit her back neck and head.  No LOC, nausea or vomiting.  She complaining of some headache and neck pain.  No significant swelling or hematoma.  No neurological deficits.  CT of the head and neck negative for any acute bony abnormality or intracranial abnormality.  X-rays of the thoracic lumbar spine wrist and hand showed stable L1 compression fracture with no increasing compression and no adjacent vertebral body compressive borders.  She did have some slight widening of the scapholunate joint of the left wrist.  She is placed into a thumb spica brace.  Patient is given Norco for pain.  She is educated on head injuries and symptoms to return to the ED for.  She will continue with TLSO brace and follow-up with orthopedics.  Final Clinical Impressions(s) / ED Diagnoses   Final diagnoses:  Fall, initial encounter  Injury of head, initial encounter  Contusion of scalp, initial encounter  Closed compression fracture of body of L1 vertebra (HCC)  Acute pain of left wrist    ED Discharge Orders         Ordered    HYDROcodone-acetaminophen (NORCO) 5-325 MG tablet  Every 4 hours PRN     12/29/18 2110           Duanne Guess, Hershal Coria 12/29/18 2119  Schuyler Amor, MD 12/29/18 [phone]

## 2018-12-30 ENCOUNTER — Telehealth: Payer: Self-pay

## 2018-12-30 MED ORDER — FREESTYLE LIBRE 14 DAY SENSOR MISC: 1.0000 | 1 refills | Status: DC

## 2018-12-30 MED ORDER — FREESTYLE LIBRE 14 DAY READER DEVI: 1.0000 | Freq: Four times a day (QID) | 0 refills | Status: DC

## 2018-12-30 NOTE — Telephone Encounter (Signed)
Faxed

## 2019-01-01 ENCOUNTER — Encounter: Payer: Self-pay | Admitting: Family Medicine

## 2019-01-01 ENCOUNTER — Other Ambulatory Visit: Payer: Self-pay | Admitting: Physician Assistant

## 2019-01-01 ENCOUNTER — Other Ambulatory Visit: Payer: Self-pay

## 2019-01-01 ENCOUNTER — Ambulatory Visit (INDEPENDENT_AMBULATORY_CARE_PROVIDER_SITE_OTHER): Payer: Medicare Other | Admitting: Family Medicine

## 2019-01-01 ENCOUNTER — Other Ambulatory Visit: Payer: Self-pay | Admitting: Family Medicine

## 2019-01-01 DIAGNOSIS — G8929 Other chronic pain: Secondary | ICD-10-CM

## 2019-01-01 DIAGNOSIS — Z794 Long term (current) use of insulin: Secondary | ICD-10-CM

## 2019-01-01 DIAGNOSIS — E1165 Type 2 diabetes mellitus with hyperglycemia: Secondary | ICD-10-CM | POA: Diagnosis not present

## 2019-01-01 DIAGNOSIS — W19XXXD Unspecified fall, subsequent encounter: Secondary | ICD-10-CM

## 2019-01-01 DIAGNOSIS — Z8739 Personal history of other diseases of the musculoskeletal system and connective tissue: Secondary | ICD-10-CM | POA: Insufficient documentation

## 2019-01-01 DIAGNOSIS — M316 Other giant cell arteritis: Secondary | ICD-10-CM | POA: Insufficient documentation

## 2019-01-01 DIAGNOSIS — M545 Low back pain, unspecified: Secondary | ICD-10-CM

## 2019-01-01 MED ORDER — LORATADINE 10 MG PO TABS: 10.0000 mg | ORAL_TABLET | Freq: Every day | ORAL | 1 refills | Status: AC

## 2019-01-01 MED ORDER — HYDRALAZINE HCL 50 MG PO TABS: 50.0000 mg | ORAL_TABLET | Freq: Three times a day (TID) | ORAL | 1 refills | Status: DC

## 2019-01-01 MED ORDER — VITAMIN D (ERGOCALCIFEROL) 1.25 MG (50000 UNIT) PO CAPS: 50000.0000 [IU] | ORAL_CAPSULE | ORAL | 0 refills | Status: DC

## 2019-01-01 MED ORDER — MYRBETRIQ 25 MG PO TB24: 25.0000 mg | ORAL_TABLET | Freq: Every day | ORAL | 0 refills | Status: DC

## 2019-01-01 MED ORDER — SURE COMFORT PEN NEEDLES 31G X 5 MM MISC: 1.0000 "pen " | Freq: Every day | 3 refills | Status: DC | PRN

## 2019-01-01 MED ORDER — MECLIZINE HCL 25 MG PO TABS: 25.0000 mg | ORAL_TABLET | Freq: Two times a day (BID) | ORAL | 0 refills | Status: DC | PRN

## 2019-01-01 MED ORDER — CLOPIDOGREL BISULFATE 75 MG PO TABS: 75.0000 mg | ORAL_TABLET | Freq: Every day | ORAL | 1 refills | Status: DC

## 2019-01-01 MED ORDER — EPINEPHRINE 0.3 MG/0.3ML IJ SOAJ: 0.3000 mg | INTRAMUSCULAR | 1 refills | Status: DC | PRN

## 2019-01-01 MED ORDER — MONTELUKAST SODIUM 10 MG PO TABS: 10.0000 mg | ORAL_TABLET | Freq: Every day | ORAL | 0 refills | Status: DC

## 2019-01-01 MED ORDER — NOVOLOG FLEXPEN 100 UNIT/ML ~~LOC~~ SOPN: 14.0000 [IU] | PEN_INJECTOR | Freq: Three times a day (TID) | SUBCUTANEOUS | 1 refills | Status: DC

## 2019-01-01 MED ORDER — ADVAIR HFA 230-21 MCG/ACT IN AERO: 1.0000 | INHALATION_SPRAY | Freq: Two times a day (BID) | RESPIRATORY_TRACT | 3 refills | Status: DC

## 2019-01-01 MED ORDER — INSULIN GLARGINE 100 UNIT/ML SOLOSTAR PEN: 60.0000 [IU] | PEN_INJECTOR | Freq: Every day | SUBCUTANEOUS | 1 refills | Status: DC

## 2019-01-01 MED ORDER — LISINOPRIL 5 MG PO TABS: 5.0000 mg | ORAL_TABLET | Freq: Every day | ORAL | 1 refills | Status: DC

## 2019-01-01 MED ORDER — OMEGA-3-ACID ETHYL ESTERS 1 G PO CAPS: 2.0000 g | ORAL_CAPSULE | Freq: Two times a day (BID) | ORAL | 1 refills | Status: DC

## 2019-01-01 NOTE — Telephone Encounter (Signed)
Patient coming for appointment on 6/5

## 2019-01-01 NOTE — Telephone Encounter (Unsigned)
Copied from [place] [phone]. Topic: General - Inquiry >> Jan 01, 2019 12:25 PM Mathis Bud wrote: Reason for CRM: Patient is new to [facility] and is asking for all of her medications refilled. Patient states she will be out of all these medications in the next week.  Patient has appt with PCP 6/5 at 1:40 to discuss further  ADVAIR HFA 230-21 MCG/ACT inhaler  cloNIDine (CATAPRES) 0.1 MG tablet  clopidogrel (PLAVIX) 75 MG tablet  EPINEPHrine (EPIPEN 2-PAK) 0.3 mg/0.3 mL IJ SOAJ injection  hydrALAZINE (APRESOLINE) 50 MG tablet  Insulin Glargine (LANTUS SOLOSTAR) 100 UNIT/ML Solostar Pen  lisinopril (ZESTRIL) 5 MG tablet loratadine (CLARITIN) 10 MG tablet LORazepam (ATIVAN) 1 MG tablet  meclizine (ANTIVERT) 25 MG tablet methocarbamol (ROBAXIN) 750 MG tablet montelukast (SINGULAIR) 10 MG tablet  MYRBETRIQ 25 MG TB24  NIFEdipine (ADALAT CC) 60 MG 24 hr tablet  NOVOLOG FLEXPEN 100 UNIT/ML FlexPen  omega-3 acid ethyl esters (LOVAZA) 1 g capsule  pantoprazole (PROTONIX) 40 MG tablet primidone (MYSOLINE) 250 MG tablet SURE COMFORT PEN NEEDLES 31G X 5 MM MISC + Vitamin D, Ergocalciferol, (DRISDOL) 1.25 MG (50000 UT) CAPS capsule      [facility] [place] [place] - [address] [phone] (Phone) [phone] (Fax)

## 2019-01-01 NOTE — Patient Instructions (Signed)
Please find out if any of the family history of thyroid Cancers were labeled as: Medullary thyroid carcinoma (MTC) or in patients with Multiple Endocrine Neoplasia syndrome type 2 (MEN 2)  DO NOT TAKE OZEMPIC until you get back to [place] regarding type of thyroid cancer.

## 2019-01-01 NOTE — Progress Notes (Signed)
Name: Victoria Austin   MRN: [id number]    DOB: [birth year]   Date:01/01/2019       Progress Note  Subjective  Chief Complaint  Chief Complaint  Patient presents with  . Follow-up    ER  . Fall  . Medication Refill    I connected with  [name]  on 01/01/19 at  1:40 PM EDT by a video enabled telemedicine application and verified that I am speaking with the correct person using two identifiers.  I discussed the limitations of evaluation and management by telemedicine and the availability of in person appointments. The patient expressed understanding and agreed to proceed. Staff also discussed with the patient that there may be a patient responsible charge related to this service. Patient Location: Home Provider Location: Office Additional Individuals present: Grandson - assists with camera to visualize posterior head.   HPI  Pt presents for ER follow up for fall.  Was seen 12/29/2018 in the ER for fall. She was put on hydrocodone and methocarbamol and has been under good pain control.  She has hematoma healing on the posterior head that is shrinking in size and tenderness each day.  She does endorse headache, some neck pain and mid and low back pain which is helped with her medication; no numbness/tingling or loss of bowel/bladder control.  She is having trouble going up the stairs, so she has been unable to shower - we will send home health order today.  History from 12/29/2018: Victoria Austin is a 68 y.o. female presents to the emergency department via EMS for evaluation of fall at home.  Patient went to sit down on a chair, chair did not have a back and she fell backwards onto her buttocks hit her head and neck on a shelf.  She is complaining of headache, neck pain, mid and low back pain.  Recently suffered L1 compression deformity and has been discussing possible kyphoplasty procedure.  No numbness tingling radicular symptoms in the upper lower extremities.  She also complaining of left wrist  and hand pain.  No other lower extremity discomfort.  Pain is moderate.  No LOC, nausea or vomiting.  No chest pain or shortness of breath.  DG LEFT WRIST: In a removable cast (only taking off for showers) due to finding of "Widened scapholunate joint space could suggest a ligament injury." Hand, BG Thoracic, DG Lumbar, CT Head, CT C-spine - found no acute abnormalities, but does have degenerative changes.   Has DM: was told to find out if she had family history of thyroid cancer.  Advised to NOT Start Ozempic that was sent in until she confirms type of thyroid cancer in her family.  Patient Active Problem List   Diagnosis Date Noted  . Goals of care, counseling/discussion 12/26/2018  . Albuminuria 12/18/2018  . Type 2 diabetes mellitus with hyperglycemia, with long-term current use of insulin ([place]) 12/11/2018  . Compression fracture of L1 lumbar vertebra ([place]) 12/11/2018  . 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 Anxiety   . Depression   . Bipolar 1 disorder ([place]) 07/29/2004    Past Surgical History:  Procedure Laterality Date  . BACK SURGERY    . CESAREAN SECTION    . ELBOW ARTHROSCOPY    . FINGER SURGERY    . HAND SURGERY    . HERNIA REPAIR    . SPINE SURGERY      Family History  Problem Relation Age of Onset  .  Lung cancer Mother   . Hypertension Mother   . Hypertension Sister   . Hypertension Brother   . Bone cancer Maternal Aunt   . Colon cancer Maternal Uncle     Social History   Socioeconomic History  . Marital status: Legally Separated    Spouse name: Not on file  . Number of children: 2  . Years of education: Not on file  . Highest education level: Not on file  Occupational History  . Occupation: retired  Scientific laboratory technician  . Financial resource strain: Not hard at all  . Food insecurity:    Worry: Never true    Inability: Never true  . Transportation needs:    Medical: No    Non-medical: No  Tobacco Use  .  Smoking status: Never Smoker  . Smokeless tobacco: Never Used  Substance and Sexual Activity  . Alcohol use: Never    Frequency: Never  . Drug use: Never  . Sexual activity: Not Currently    Partners: Male  Lifestyle  . Physical activity:    Days per week: 0 days    Minutes per session: 0 min  . Stress: Very much  Relationships  . Social connections:    Talks on phone: More than three times a week    Gets together: More than three times a week    Attends religious service: Never    Active member of club or organization: No    Attends meetings of clubs or organizations: Never    Relationship status: Separated  . Intimate partner violence:    Fear of current or ex partner: No    Emotionally abused: No    Physically abused: No    Forced sexual activity: No  Other Topics Concern  . Not on file  Social History Narrative  . Not on file     Current Outpatient Medications:  .  ADVAIR HFA 230-21 MCG/ACT inhaler, Take 1 Inhaler by mouth daily., Disp: , Rfl:  .  albuterol (PROAIR HFA) 108 (90 Base) MCG/ACT inhaler, Take 1 puff by mouth 2 (two) times daily as needed., Disp: , Rfl:  .  buPROPion (WELLBUTRIN) 100 MG tablet, Take 100 mg by mouth daily., Disp: , Rfl:  .  carbamazepine (TEGRETOL) 200 MG tablet, Take 1 tablet by mouth 3 (three) times daily., Disp: , Rfl:  .  cloNIDine (CATAPRES) 0.1 MG tablet, Take 0.1 mg by mouth daily., Disp: , Rfl:  .  clopidogrel (PLAVIX) 75 MG tablet, Take 1 tablet by mouth daily., Disp: , Rfl:  .  Continuous Blood Gluc Receiver (FREESTYLE LIBRE 14 DAY READER) DEVI, 1 Device by Does not apply route 4 (four) times daily., Disp: 1 Device, Rfl: 0 .  Continuous Blood Gluc Sensor (FREESTYLE LIBRE 14 DAY SENSOR) MISC, 1 Device by Does not apply route every 14 (fourteen) days., Disp: 6 each, Rfl: 1 .  DULoxetine (CYMBALTA) 60 MG capsule, Take 1 capsule by mouth daily., Disp: , Rfl:  .  EPINEPHrine (EPIPEN 2-PAK) 0.3 mg/0.3 mL IJ SOAJ injection, Inject 1  application as directed once., Disp: , Rfl:  .  gabapentin (NEURONTIN) 300 MG capsule, Take 1 capsule by mouth daily., Disp: , Rfl:  .  hydrALAZINE (APRESOLINE) 50 MG tablet, Take 1 tablet by mouth 3 (three) times daily., Disp: , Rfl:  .  HYDROcodone-acetaminophen (NORCO) 5-325 MG tablet, Take 1 tablet by mouth every 4 (four) hours as needed for moderate pain., Disp: 20 tablet, Rfl: 0 .  Insulin Glargine (LANTUS SOLOSTAR)  100 UNIT/ML Solostar Pen, Inject 60 Units as directed once., Disp: , Rfl:  .  lisinopril (ZESTRIL) 5 MG tablet, Take 1 tablet by mouth once., Disp: , Rfl:  .  loratadine (CLARITIN) 10 MG tablet, Take 1 tablet by mouth daily., Disp: , Rfl:  .  LORazepam (ATIVAN) 1 MG tablet, Take 1 tablet by mouth daily., Disp: , Rfl:  .  MAGNESIUM-OXIDE 400 (241.3 Mg) MG tablet, Take 1 Dose by mouth daily., Disp: , Rfl:  .  meclizine (ANTIVERT) 25 MG tablet, Take 1 tablet by mouth 3 (three) times daily., Disp: , Rfl:  .  methocarbamol (ROBAXIN) 750 MG tablet, Take 1 tablet by mouth 2 (two) times daily., Disp: , Rfl:  .  montelukast (SINGULAIR) 10 MG tablet, Take 1 tablet by mouth daily., Disp: , Rfl:  .  MYRBETRIQ 25 MG TB24 tablet, Take 1 tablet by mouth once., Disp: , Rfl:  .  NIFEdipine (ADALAT CC) 60 MG 24 hr tablet, Take 1 tablet (60 mg total) by mouth 2 (two) times daily., Disp: 180 tablet, Rfl: 1 .  NOVOLOG FLEXPEN 100 UNIT/ML FlexPen, Inject 14 Units as directed 3 (three) times daily., Disp: , Rfl:  .  omega-3 acid ethyl esters (LOVAZA) 1 g capsule, Take 4 capsules by mouth daily., Disp: , Rfl:  .  pantoprazole (PROTONIX) 40 MG tablet, Take 1 tablet by mouth daily., Disp: , Rfl:  .  primidone (MYSOLINE) 250 MG tablet, Take 4 tablets by mouth daily., Disp: , Rfl:  .  promethazine (PHENERGAN) 25 MG tablet, Take 1 tablet by mouth 2 (two) times a day., Disp: , Rfl:  .  propranolol (INDERAL) 10 MG tablet, Take 1 tablet by mouth 2 (two) times a day., Disp: , Rfl:  .  rosuvastatin (CRESTOR) 20  MG tablet, Take 1 tablet (20 mg total) by mouth daily., Disp: 90 tablet, Rfl: 3 .  Semaglutide,0.25 or 0.5MG /DOS, (OZEMPIC, 0.25 OR 0.5 MG/DOSE,) 2 MG/1.5ML SOPN, Inject 0.5 mg into the skin once a week., Disp: 3 pen, Rfl: 1 .  SURE COMFORT PEN NEEDLES 31G X 5 MM MISC, Inject 1 pen as directed 5 (five) times daily as needed., Disp: , Rfl:  .  traZODone (DESYREL) 50 MG tablet, Take 1 tablet by mouth at bedtime., Disp: , Rfl:  .  Vitamin D, Ergocalciferol, (DRISDOL) 1.25 MG (50000 UT) CAPS capsule, Take 1 capsule by mouth once a week., Disp: , Rfl:   Allergies  Allergen Reactions  . Ciprofloxacin Nausea And Vomiting  . Cyclobenzaprine Nausea And Vomiting  . Diazepam Nausea And Vomiting  . Ketorolac     Other reaction(s): Unknown  . Mushroom Extract Complex   . Neomycin-Polymyxin-Gramicidin   . Ranitidine Hcl   . Ondansetron Nausea And Vomiting    I personally reviewed active problem list, medication list, allergies, notes from last encounter, lab results with the patient/caregiver today.   ROS  Constitutional: Negative for fever or weight change.  Respiratory: Negative for cough and shortness of breath.   Cardiovascular: Negative for chest pain or palpitations.  Gastrointestinal: Negative for abdominal pain, no bowel changes.  Musculoskeletal: Negative for gait problem or joint swelling.  Skin: Negative for rash.  Neurological: Negative for dizziness or headache.  No other specific complaints in a complete review of systems (except as listed in HPI above).   Objective  Virtual encounter, vitals not obtained.  There is no height or weight on file to calculate BMI.  Physical Exam Constitutional: Patient appears well-developed and well-nourished. No  distress.  HENT: Head: Normocephalic and had hematoma to the posterior occiput that she states is much smaller than initially.  She does have tenderness over the area.  Neck: Normal range of motion. Pulmonary/Chest: Effort normal.  No respiratory distress. Speaking in complete sentences Neurological: Pt is alert and oriented to person, place, and time. Coordination, speech and gait are normal.  Psychiatric: Patient has a normal mood and affect. behavior is normal. Judgment and thought content normal.  No results found for this or any previous visit (from the past 72 hour(s)).  PHQ2/9: Depression screen [facility] 2/9 01/01/2019 12/22/2018 12/18/2018 12/11/2018  Decreased Interest 0 0 0 2  Down, Depressed, Hopeless 0 0 1 2  PHQ - 2 Score 0 0 1 4  Altered sleeping 1 - 1 0  Tired, decreased energy 0 - 0 0  Change in appetite 1 - 2 1  Feeling bad or failure about yourself  1 - 2 2  Trouble concentrating 0 - 0 2  Moving slowly or fidgety/restless 0 - 0 1  Suicidal thoughts 0 - 0 2  PHQ-9 Score 3 - 6 12  Difficult doing work/chores Somewhat difficult - Somewhat difficult Somewhat difficult   PHQ-2/9 Result is negative.    Fall Risk: Fall Risk  12/18/2018 12/11/2018  Falls in the past year? 1 1  Number falls in past yr: 1 1  Injury with Fall? 1 1  Risk for fall due to : - History of fall(s)  Follow up - Falls evaluation completed    Assessment & Plan  1. Fall, subsequent encounter - Ambulatory referral to [place] - Pain is mostly controlled with ER medications; I am concerned that she needs PT and possible home health aid currently - will send home health for eval and treat - Hematoma appears to be improving; has family at home to help some.  2. Type 2 diabetes mellitus with hyperglycemia, with long-term current use of insulin ([facility]) - DO NOT TAKE OZEMPIC until confirming what type of thyroid cancer family member had.  She verbalizes understanding and will find out today.  I discussed the assessment and treatment plan with the patient. The patient was provided an opportunity to ask questions and all were answered. The patient agreed with the plan and demonstrated an understanding of the instructions.  The patient was  advised to call back or seek an in-person evaluation if the symptoms worsen or if the condition fails to improve as anticipated.  I provided 32 minutes of non-face-to-face time during this encounter.

## 2019-01-01 NOTE — Telephone Encounter (Signed)
Please que up medications that she needs refilled and I will sign off on these.

## 2019-01-01 NOTE — Telephone Encounter (Signed)
-   Primidone: Please confirm Primidone dosing - is she taking 1 tablet three times or four times daily?  I am "refusing" the medication for now until we can confirm dosing.  - Ativan: I cannot refill ongoing/as routine rx, she will need to obtain this from her specialist.   If psychiatry is not able to schedule before she runs out, please advise that we will need to discuss this at her upcoming visit.   - Clonidine: Please confirm number of times she takes this daily. I am "refusing" the medication for now until we can confirm dosing.  - Nifedipine: Please confirm number of times she takes this daily. I am "refusing" the medication for now until we can confirm dosing.  - Please attempt to obtain most recent note from prior PCP.  Please also obtain list of prior specialists that she used to see.  Based on my current limited knowledge of the patient, I would recommend she see a cardiologist. We have referred to several specialties already - please ensure that these are being processed, especially her psychiatry referral.   - Patient is a good historian of her complex medical history, and a comprehensive medication reconciliation was performed by the CMA, therefore we will maintain medications at current dosing.

## 2019-01-01 NOTE — Telephone Encounter (Signed)
Already done in a previous note

## 2019-01-05 ENCOUNTER — Other Ambulatory Visit: Payer: Self-pay | Admitting: Emergency Medicine

## 2019-01-05 MED ORDER — METHOCARBAMOL 750 MG PO TABS: 750.0000 mg | ORAL_TABLET | Freq: Two times a day (BID) | ORAL | 0 refills | Status: DC | PRN

## 2019-01-05 MED ORDER — INSULIN GLARGINE 100 UNIT/ML SOLOSTAR PEN: 80.0000 [IU] | PEN_INJECTOR | Freq: Every day | SUBCUTANEOUS | 1 refills | Status: DC

## 2019-01-05 MED ORDER — MECLIZINE HCL 25 MG PO TABS: 25.0000 mg | ORAL_TABLET | Freq: Three times a day (TID) | ORAL | 0 refills | Status: DC | PRN

## 2019-01-05 MED ORDER — MAGNESIUM-OXIDE 400 (241.3 MG) MG PO TABS: 400.0000 mg | ORAL_TABLET | Freq: Every day | ORAL | 0 refills | Status: DC

## 2019-01-06 ENCOUNTER — Telehealth: Payer: Self-pay

## 2019-01-06 ENCOUNTER — Ambulatory Visit: Payer: Self-pay | Admitting: Pharmacist

## 2019-01-06 NOTE — Chronic Care Management (AMB) (Signed)
  Chronic Care Management   Note  01/06/2019 Name: Victoria Austin MRN: [id number] DOB: 10/16/1950  68 y.o. year old female referred to Chronic Care Management by [name], FNP for medication accessibility [facility]. Chronic conditions include T2DM, bipolar. Last office visit with Hubbard Hartshorn, FNP was 01/01/19..   Was unable to reach patient via telephone today and have left HIPAA compliant voicemail asking patient to return my call. (unsuccessful outreach #1).  Follow up plan: A HIPPA compliant phone message was left for the patient providing contact information and requesting a return call.  The care management team will reach out to the patient again over the next 5-7 days.   Ruben Reason, PharmD Clinical Pharmacist [facility] [phone]

## 2019-01-08 ENCOUNTER — Other Ambulatory Visit: Payer: Self-pay

## 2019-01-08 ENCOUNTER — Encounter: Payer: Self-pay | Admitting: Family Medicine

## 2019-01-08 ENCOUNTER — Ambulatory Visit (INDEPENDENT_AMBULATORY_CARE_PROVIDER_SITE_OTHER): Payer: Medicare Other | Admitting: Family Medicine

## 2019-01-08 VITALS — BP 138/80 | HR 79 | Temp 97.5°F | Resp 16 | Ht 65.0 in | Wt 181.8 lb

## 2019-01-08 DIAGNOSIS — M069 Rheumatoid arthritis, unspecified: Secondary | ICD-10-CM

## 2019-01-08 DIAGNOSIS — M316 Other giant cell arteritis: Secondary | ICD-10-CM | POA: Diagnosis not present

## 2019-01-08 DIAGNOSIS — C8110 Nodular sclerosis classical Hodgkin lymphoma, unspecified site: Secondary | ICD-10-CM | POA: Diagnosis not present

## 2019-01-08 DIAGNOSIS — S32010D Wedge compression fracture of first lumbar vertebra, subsequent encounter for fracture with routine healing: Secondary | ICD-10-CM

## 2019-01-08 DIAGNOSIS — E1165 Type 2 diabetes mellitus with hyperglycemia: Secondary | ICD-10-CM | POA: Diagnosis not present

## 2019-01-08 DIAGNOSIS — E11319 Type 2 diabetes mellitus with unspecified diabetic retinopathy without macular edema: Secondary | ICD-10-CM | POA: Diagnosis not present

## 2019-01-08 DIAGNOSIS — Z8673 Personal history of transient ischemic attack (TIA), and cerebral infarction without residual deficits: Secondary | ICD-10-CM | POA: Insufficient documentation

## 2019-01-08 DIAGNOSIS — I639 Cerebral infarction, unspecified: Secondary | ICD-10-CM | POA: Insufficient documentation

## 2019-01-08 DIAGNOSIS — F319 Bipolar disorder, unspecified: Secondary | ICD-10-CM

## 2019-01-08 DIAGNOSIS — N390 Urinary tract infection, site not specified: Secondary | ICD-10-CM

## 2019-01-08 DIAGNOSIS — F329 Major depressive disorder, single episode, unspecified: Secondary | ICD-10-CM

## 2019-01-08 DIAGNOSIS — F419 Anxiety disorder, unspecified: Secondary | ICD-10-CM

## 2019-01-08 DIAGNOSIS — R296 Repeated falls: Secondary | ICD-10-CM

## 2019-01-08 DIAGNOSIS — M79642 Pain in left hand: Secondary | ICD-10-CM | POA: Diagnosis not present

## 2019-01-08 DIAGNOSIS — R42 Dizziness and giddiness: Secondary | ICD-10-CM | POA: Diagnosis not present

## 2019-01-08 DIAGNOSIS — R809 Proteinuria, unspecified: Secondary | ICD-10-CM

## 2019-01-08 DIAGNOSIS — Z794 Long term (current) use of insulin: Secondary | ICD-10-CM | POA: Diagnosis not present

## 2019-01-08 DIAGNOSIS — F32A Depression, unspecified: Secondary | ICD-10-CM

## 2019-01-08 DIAGNOSIS — R32 Unspecified urinary incontinence: Secondary | ICD-10-CM | POA: Insufficient documentation

## 2019-01-08 NOTE — Progress Notes (Signed)
Name: Victoria Austin   MRN: [id number]    DOB: 1950-08-18   Date:01/08/2019       Progress Note  Subjective  Chief Complaint  Chief Complaint  Patient presents with  . Follow-up    HPI  Frequent Falls: Home health was ordered at last visit, but she has not heard back - we will check about this.  She fell in March 2020 and was diagnosed with compression fracture of the spine. Again on 12/29/2018 and had hematoma to the posterior head (essentially resolved) and LEFT hand injury with possible ligament tear (needs referral to ortho).  She has been seeing spine specialty with [facility] - last visit was 12/04/2018.  She has been doing a lot of sitting lately and feels very stiff, is also having pain that shoots into the BLE R>L. She is taking muscle relaxer and gabapentin without issues.  Her Xray from 12/09/2018 and there is a L1 compression fx - MRI is recommended for further evaluation.  Will recommend she continue follow up with spine specialty, referral to hands and home health.  Vertigo: Falling frequently due to vertigo.  She has poor balance; was referred to home health for eval for PT if needed. She does take meclizine 2 tablets QAM an 1 tablet in the afternoon with only minimal help. She describes the dizziness as room spinning dizziness. Referral to ENT for vertigo clinic is placed today.   T2DM - BG's have been running in the low 200's (fastings are 110's-180's). She used to see an endocrinologist prior to moving from [place] recently. Currently taking Lantus 80 units. Novolog sliding scale - 150-200 - 12 units, each additional 50 BG's she will add 2 units. She used to take Metformin but was taken off due to diarrhea. She has a first cousin with Medullary thyroid cancer history, so she is not a candidate for GLP-1 therapy.  She was noted to have retinopathy at her recent eye examination.  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 [name] - appt 02/23/2019.  Recurrent UTI: She has appointment with Urology 01/28/19.  She is having strong odor and dark colored urine.  She has not been drinking much fluids - encouraged increase fluid intake.  No fevers/chills, abdominal pain, flank pain; has ongoing back pain.  Taking mysoline daily.   Does also have history of incontinence, wearing briefs, will discuss further with urology.   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   History of Temporal Arteritis: Had recent eye examination, having follow up on Monday for this as well.  Not taking prednisone, no recent medication changes.   RA/FMS: She has history of RA and is not on any medication aside from ocasional muscle relaxer.  She notes LEFT foot does occasionally have pain. Taking cymbalta, gabapenting  Bipolar 1 & Depression: She was referred to psychiatry, but has not heard back yet.  No SI/HI in office, though she does admit to having passive thoughts intermittently throughout the day (no plan), no hallucinations.  She has been keeping up virtually with her [place] until referral locally goes through. Taking tegretol, Wellbutrin, Ativan, Trazodone.  History of Stroke: Taking Plavix daily.  Had stroke 2019. No residual effects aside from some memory impairment on occasin.  Has had 2 TIA's prior to this.  Patient  Active Problem List   Diagnosis Date Noted  . Temporal arteritis ([place]) 01/01/2019  . Goals of care, counseling/discussion 12/26/2018  . Albuminuria 12/18/2018  . Type 2 diabetes mellitus with hyperglycemia, with long-term current use of insulin ([place]) 12/11/2018  . Compression fracture of L1 lumbar vertebra ([place]) 12/11/2018  . 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 Anxiety   . Depression   . Bipolar 1 disorder ([place]) 07/29/2004     Past Surgical History:  Procedure Laterality Date  . BACK SURGERY    . CESAREAN SECTION    . ELBOW ARTHROSCOPY    . FINGER SURGERY    . HAND SURGERY    . HERNIA REPAIR    . SPINE SURGERY      Family History  Problem Relation Age of Onset  . Lung cancer Mother   . Hypertension Mother   . Hypertension Sister   . Hypertension Brother   . Bone cancer Maternal Aunt   . Colon cancer Maternal Uncle     Social History   Socioeconomic History  . Marital status: Legally Separated    Spouse name: Not on file  . Number of children: 2  . Years of education: Not on file  . Highest education level: Not on file  Occupational History  . Occupation: retired  Scientific laboratory technician  . Financial resource strain: Not hard at all  . Food insecurity    Worry: Never true    Inability: Never true  . Transportation needs    Medical: No    Non-medical: No  Tobacco Use  . Smoking status: Never Smoker  . Smokeless tobacco: Never Used  Substance and Sexual Activity  . Alcohol use: Never    Frequency: Never  . Drug use: Never  . Sexual activity: Not Currently    Partners: Male  Lifestyle  . Physical activity    Days per week: 0 days    Minutes per session: 0 min  . Stress: Very much  Relationships  . Social connections    Talks on phone: More than three times a week    Gets together: More than three times a week    Attends religious service: Never    Active member of club or organization: No    Attends meetings of clubs or organizations: Never    Relationship status: Separated  . Intimate partner violence    Fear of current or ex partner: No    Emotionally abused: No    Physically abused: No    Forced sexual activity: No  Other Topics Concern  . Not on file  Social History Narrative  . Not on file     Current Outpatient Medications:  .  ADVAIR HFA 035-00 MCG/ACT inhaler, Inhale 1 puff into the lungs 2 (two) times daily., Disp: 1 Inhaler, Rfl: 3 .  albuterol (PROAIR HFA) 108 (90  Base) MCG/ACT inhaler, Take 1 puff by mouth 2 (two) times daily as needed., Disp: , Rfl:  .  buPROPion (WELLBUTRIN) 100 MG tablet, Take 100 mg by mouth daily., Disp: , Rfl:  .  carbamazepine (TEGRETOL) 200 MG tablet, Take 1 tablet by mouth 3 (three) times daily., Disp: , Rfl:  .  cloNIDine (CATAPRES) 0.1 MG tablet, Take 0.1 mg by mouth daily., Disp: , Rfl:  .  clopidogrel (PLAVIX) 75 MG tablet, Take 1 tablet (75 mg total) by mouth daily., Disp: 90 tablet, Rfl: 1 .  Continuous Blood Gluc Receiver (FREESTYLE LIBRE 14 DAY  READER) DEVI, 1 Device by Does not apply route 4 (four) times daily., Disp: 1 Device, Rfl: 0 .  Continuous Blood Gluc Sensor (FREESTYLE LIBRE 14 DAY SENSOR) MISC, 1 Device by Does not apply route every 14 (fourteen) days., Disp: 6 each, Rfl: 1 .  DULoxetine (CYMBALTA) 60 MG capsule, Take 1 capsule by mouth daily., Disp: , Rfl:  .  EPINEPHrine (EPIPEN 2-PAK) 0.3 mg/0.3 mL IJ SOAJ injection, Inject 0.3 mLs (0.3 mg total) into the muscle as needed for anaphylaxis. Then call 911 for ER transport., Disp: 0.3 mL, Rfl: 1 .  gabapentin (NEURONTIN) 300 MG capsule, Take 1 capsule by mouth daily., Disp: , Rfl:  .  hydrALAZINE (APRESOLINE) 50 MG tablet, Take 1 tablet (50 mg total) by mouth 3 (three) times daily., Disp: 90 tablet, Rfl: 1 .  HYDROcodone-acetaminophen (NORCO) 5-325 MG tablet, Take 1 tablet by mouth every 4 (four) hours as needed for moderate pain., Disp: 20 tablet, Rfl: 0 .  Insulin Glargine (LANTUS SOLOSTAR) 100 UNIT/ML Solostar Pen, Inject 80 Units into the skin daily., Disp: 10 pen, Rfl: 1 .  lisinopril (ZESTRIL) 5 MG tablet, Take 1 tablet (5 mg total) by mouth daily., Disp: 90 tablet, Rfl: 1 .  loratadine (CLARITIN) 10 MG tablet, Take 1 tablet (10 mg total) by mouth daily., Disp: 90 tablet, Rfl: 1 .  LORazepam (ATIVAN) 1 MG tablet, Take 1 tablet by mouth daily., Disp: , Rfl:  .  MAGNESIUM-OXIDE 400 (241.3 Mg) MG tablet, Take 1 tablet (400 mg total) by mouth daily., Disp: 90  tablet, Rfl: 0 .  meclizine (ANTIVERT) 25 MG tablet, Take 1 tablet (25 mg total) by mouth 3 (three) times daily as needed for dizziness., Disp: 90 tablet, Rfl: 0 .  methocarbamol (ROBAXIN) 750 MG tablet, Take 1 tablet (750 mg total) by mouth 2 (two) times daily as needed for muscle spasms., Disp: 90 tablet, Rfl: 0 .  montelukast (SINGULAIR) 10 MG tablet, Take 1 tablet (10 mg total) by mouth daily., Disp: 90 tablet, Rfl: 0 .  MYRBETRIQ 25 MG TB24 tablet, Take 1 tablet (25 mg total) by mouth daily., Disp: 90 tablet, Rfl: 0 .  NIFEdipine (ADALAT CC) 60 MG 24 hr tablet, Take 1 tablet (60 mg total) by mouth 2 (two) times daily., Disp: 180 tablet, Rfl: 1 .  NOVOLOG FLEXPEN 100 UNIT/ML FlexPen, Inject 14 Units into the skin 3 (three) times daily. With meals, Disp: 15 mL, Rfl: 1 .  omega-3 acid ethyl esters (LOVAZA) 1 g capsule, Take 2 capsules (2 g total) by mouth 2 (two) times daily., Disp: 360 capsule, Rfl: 1 .  pantoprazole (PROTONIX) 40 MG tablet, Take 1 tablet by mouth daily., Disp: , Rfl:  .  primidone (MYSOLINE) 250 MG tablet, Take 4 tablets by mouth daily., Disp: , Rfl:  .  promethazine (PHENERGAN) 25 MG tablet, Take 1 tablet by mouth 2 (two) times a day., Disp: , Rfl:  .  propranolol (INDERAL) 10 MG tablet, Take 1 tablet by mouth 2 (two) times a day., Disp: , Rfl:  .  rosuvastatin (CRESTOR) 20 MG tablet, Take 1 tablet (20 mg total) by mouth daily., Disp: 90 tablet, Rfl: 3 .  Semaglutide,0.25 or 0.5MG /DOS, (OZEMPIC, 0.25 OR 0.5 MG/DOSE,) 2 MG/1.5ML SOPN, Inject 0.5 mg into the skin once a week., Disp: 3 pen, Rfl: 1 .  SURE COMFORT PEN NEEDLES 31G X 5 MM MISC, Inject 1 pen as directed 5 (five) times daily as needed., Disp: 100 each, Rfl: 3 .  traZODone (  DESYREL) 50 MG tablet, Take 1 tablet by mouth at bedtime., Disp: , Rfl:  .  Vitamin D, Ergocalciferol, (DRISDOL) 1.25 MG (50000 UT) CAPS capsule, Take 1 capsule (50,000 Units total) by mouth once a week., Disp: 8 capsule, Rfl: 0  Allergies   Allergen Reactions  . Ciprofloxacin Nausea And Vomiting  . Cyclobenzaprine Nausea And Vomiting  . Diazepam Nausea And Vomiting  . Ketorolac     Other reaction(s): Unknown  . Mushroom Extract Complex   . Neomycin-Polymyxin-Gramicidin   . Ranitidine Hcl   . Ondansetron Nausea And Vomiting    I personally reviewed active problem list, medication list, allergies, notes from last encounter, lab results with the patient/caregiver today.   ROS Ten systems reviewed and is negative except as mentioned in HPI  Objective  Vitals:   01/08/19 1331  BP: 138/80  Pulse: 79  Resp: 16  Temp: (!) 97.5 F (36.4 C)  TempSrc: Oral  SpO2: 96%  Weight: 181 lb 12.8 oz (82.5 kg)  Height: 5\' 5"  (1.651 m)    Body mass index is 30.25 kg/m.  Physical Exam Constitutional: Patient appears well-developed and well-nourished. No distress.  HENT: Head: Normocephalic and atraumatic. Eyes: Conjunctivae and EOM are normal. No scleral icterus.  Neck: Normal range of motion. Neck supple. No JVD present. No thyromegaly present.  Cardiovascular: Normal rate, regular rhythm and normal heart sounds.  No murmur heard. No BLE edema. Pulmonary/Chest: Effort normal and breath sounds normal. No respiratory distress. Abdominal: Soft. Bowel sounds are normal, no distension. There is no tenderness. No masses. Musculoskeletal: Limited spinal range of motion - wearing back brace, no joint effusions. No gross deformities.  LEFT wrist has some decreased AROM and lateral/anterior tenderness is present. Neurological: Pt is alert and oriented to person, place, and time. No cranial nerve deficit. Coordination, balance, strength, speech and gait are normal.  Skin: Skin is warm and dry. No rash noted. No erythema.  Psychiatric: Patient has a normal mood and affect. behavior is normal. Judgment and thought content normal.   No results found for this or any previous visit (from the past 72 hour(s)).   PHQ2/9: Depression screen  [facility] 2/9 01/08/2019 01/01/2019 12/22/2018 12/18/2018 12/11/2018  Decreased Interest 3 0 0 0 2  Down, Depressed, Hopeless 3 0 0 1 2  PHQ - 2 Score 6 0 0 1 4  Altered sleeping 3 1 - 1 0  Tired, decreased energy 3 0 - 0 0  Change in appetite 3 1 - 2 1  Feeling bad or failure about yourself  3 1 - 2 2  Trouble concentrating 3 0 - 0 2  Moving slowly or fidgety/restless 3 0 - 0 1  Suicidal thoughts 3 0 - 0 2  PHQ-9 Score 27 3 - 6 12  Difficult doing work/chores Very difficult Somewhat difficult - Somewhat difficult Somewhat difficult   PHQ-2/9 Result is positive.   Fall Risk: Fall Risk  01/08/2019 12/18/2018 12/11/2018  Falls in the past year? 1 1 1   Number falls in past yr: 1 1 1   Injury with Fall? 1 1 1   Risk for fall due to : Impaired balance/gait;History of fall(s);Impaired mobility - History of fall(s)  Follow up - - Falls evaluation completed   Assessment & Plan  1. Frequent falls - Wopuld like eval for balance clinic.  Does have home health referral in place, but has not heard back yet. - Ambulatory referral to ENT  2. Vertigo - Ambulatory referral to ENT  3.  Left hand pain - Ambulatory referral to Orthopedics  4. Type 2 diabetes mellitus with hyperglycemia, with long-term current use of insulin ([facility]) - Urine Microalbumin w/creat. ratio  5. 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  6. Albuminuria - Recheck today; has referral in to Nephrology as she was followed by nephrologist at prior location. - Urine Microalbumin w/creat. ratio  7. Recurrent UTI - Urine Culture - Urine Microalbumin w/creat. ratio  8. Nodular sclerosing Hodgkin's lymphoma, unspecified body region [facility]) - Keep follow up with oncology  9. Temporal arteritis ([place]) - Keep follow up with ophthalmology - she reports they are checking labs and  determining if prednisone is needed.  10. Bipolar 1 disorder ([place]) - Strongly encouraged her to keep up with her [facility] team while awaiting local referral.  She has had passive SI and is struggling with mood due to recent falls.  She is pleasant in office today, denies suicidal plan.  11. Depression, unspecified depression type - See above  12. Anxiety - See above  13. Urinary incontinence, unspecified type - She has referral in place and pending appointment  14. Rheumatoid arthritis, involving unspecified site, unspecified rheumatoid factor presence ([place]) - Does not want to see any one at this point - she is seeing spine specialty for her back and referral to ortho is in place for hand injury s/p fall.  15. Compression fracture of L1 vertebra with routine healing, subsequent encounter - Continue with spine specialty.  45. Cerebrovascular accident (CVA), unspecified mechanism ([place]) - Continue plavix, she has multi-specialty referrals in place and we will hold off on neurology at this point as she is doing well with no symptoms, tolerating plavix without issue.

## 2019-01-09 DIAGNOSIS — M069 Rheumatoid arthritis, unspecified: Secondary | ICD-10-CM | POA: Diagnosis not present

## 2019-01-09 DIAGNOSIS — F419 Anxiety disorder, unspecified: Secondary | ICD-10-CM | POA: Diagnosis not present

## 2019-01-09 DIAGNOSIS — E1165 Type 2 diabetes mellitus with hyperglycemia: Secondary | ICD-10-CM | POA: Diagnosis not present

## 2019-01-09 DIAGNOSIS — I10 Essential (primary) hypertension: Secondary | ICD-10-CM | POA: Diagnosis not present

## 2019-01-09 DIAGNOSIS — F3189 Other bipolar disorder: Secondary | ICD-10-CM | POA: Diagnosis not present

## 2019-01-09 DIAGNOSIS — Z7902 Long term (current) use of antithrombotics/antiplatelets: Secondary | ICD-10-CM | POA: Diagnosis not present

## 2019-01-09 DIAGNOSIS — E11319 Type 2 diabetes mellitus with unspecified diabetic retinopathy without macular edema: Secondary | ICD-10-CM | POA: Diagnosis not present

## 2019-01-09 DIAGNOSIS — S6992XD Unspecified injury of left wrist, hand and finger(s), subsequent encounter: Secondary | ICD-10-CM | POA: Diagnosis not present

## 2019-01-09 DIAGNOSIS — C8110 Nodular sclerosis classical Hodgkin lymphoma, unspecified site: Secondary | ICD-10-CM | POA: Diagnosis not present

## 2019-01-09 DIAGNOSIS — R296 Repeated falls: Secondary | ICD-10-CM | POA: Diagnosis not present

## 2019-01-09 DIAGNOSIS — Z87440 Personal history of urinary (tract) infections: Secondary | ICD-10-CM | POA: Diagnosis not present

## 2019-01-09 DIAGNOSIS — M4856XD Collapsed vertebra, not elsewhere classified, lumbar region, subsequent encounter for fracture with routine healing: Secondary | ICD-10-CM | POA: Diagnosis not present

## 2019-01-09 DIAGNOSIS — I69011 Memory deficit following nontraumatic subarachnoid hemorrhage: Secondary | ICD-10-CM | POA: Diagnosis not present

## 2019-01-09 DIAGNOSIS — Z794 Long term (current) use of insulin: Secondary | ICD-10-CM | POA: Diagnosis not present

## 2019-01-09 DIAGNOSIS — Z9181 History of falling: Secondary | ICD-10-CM | POA: Diagnosis not present

## 2019-01-09 DIAGNOSIS — R42 Dizziness and giddiness: Secondary | ICD-10-CM | POA: Diagnosis not present

## 2019-01-09 DIAGNOSIS — W19XXXD Unspecified fall, subsequent encounter: Secondary | ICD-10-CM | POA: Diagnosis not present

## 2019-01-09 DIAGNOSIS — I677 Cerebral arteritis, not elsewhere classified: Secondary | ICD-10-CM | POA: Diagnosis not present

## 2019-01-09 LAB — URINE CULTURE
MICRO NUMBER:: 565346
SPECIMEN QUALITY:: ADEQUATE

## 2019-01-09 LAB — MICROALBUMIN / CREATININE URINE RATIO
Creatinine, Urine: 159 mg/dL (ref 20–275)
Microalb Creat Ratio: 3 ug/mg{creat}
Microalb, Ur: 0.5 mg/dL

## 2019-01-11 ENCOUNTER — Encounter: Payer: Self-pay | Admitting: Family Medicine

## 2019-01-11 ENCOUNTER — Telehealth: Payer: Self-pay | Admitting: Family Medicine

## 2019-01-11 DIAGNOSIS — E119 Type 2 diabetes mellitus without complications: Secondary | ICD-10-CM | POA: Diagnosis not present

## 2019-01-11 NOTE — Telephone Encounter (Signed)
Copied from [place] [id number]. Topic: Quick Communication - Home Health Verbal Orders >> Jan 11, 2019  1:20 PM Blase Mess A wrote: Caller/Agency: [facility] Callback Number: [phone] Requesting OT/PT/Skilled Nursing/Social Work/Speech Therapy: Skilled Nursing and PT evaluation Frequency: 1 x a week for 4 weeks

## 2019-01-12 ENCOUNTER — Ambulatory Visit: Payer: Medicare Other | Admitting: Oncology

## 2019-01-12 ENCOUNTER — Telehealth: Payer: Self-pay

## 2019-01-12 NOTE — Telephone Encounter (Signed)
Left message for Heather.

## 2019-01-13 ENCOUNTER — Ambulatory Visit: Payer: Medicare Other | Admitting: Oncology

## 2019-01-13 ENCOUNTER — Telehealth: Payer: Self-pay

## 2019-01-13 DIAGNOSIS — I677 Cerebral arteritis, not elsewhere classified: Secondary | ICD-10-CM | POA: Diagnosis not present

## 2019-01-13 DIAGNOSIS — M4856XD Collapsed vertebra, not elsewhere classified, lumbar region, subsequent encounter for fracture with routine healing: Secondary | ICD-10-CM | POA: Diagnosis not present

## 2019-01-13 DIAGNOSIS — R42 Dizziness and giddiness: Secondary | ICD-10-CM | POA: Diagnosis not present

## 2019-01-13 DIAGNOSIS — R296 Repeated falls: Secondary | ICD-10-CM | POA: Diagnosis not present

## 2019-01-13 DIAGNOSIS — S6992XD Unspecified injury of left wrist, hand and finger(s), subsequent encounter: Secondary | ICD-10-CM | POA: Diagnosis not present

## 2019-01-13 DIAGNOSIS — E1165 Type 2 diabetes mellitus with hyperglycemia: Secondary | ICD-10-CM | POA: Diagnosis not present

## 2019-01-13 NOTE — Telephone Encounter (Signed)
Pharmacy called for rx for testing supplies.  They state they need listed in last office note how many times a day patient is taking sugars, how may times a day she is injecting insulin and it needs to state she is self injecting her insulin per ins guidelines?  And we need to fax this documentation under her last office note to [phone] [place] [employer]

## 2019-01-14 NOTE — Telephone Encounter (Signed)
I completed the paperwork today - should be faxed by [facility].  She checks sugar 4 times daily, injecting insulin 4 times daily, she is self-injecting.

## 2019-01-14 NOTE — Telephone Encounter (Signed)
Paperwork was sent at 7am this morning

## 2019-01-15 ENCOUNTER — Other Ambulatory Visit: Payer: Self-pay

## 2019-01-15 ENCOUNTER — Ambulatory Visit
Admission: RE | Admit: 2019-01-15 | Discharge: 2019-01-15 | Disposition: A | Discharge status: Home / Self Care | Payer: Medicare Other | Source: Ambulatory Visit | Attending: Physician Assistant | Admitting: Physician Assistant

## 2019-01-15 ENCOUNTER — Telehealth: Payer: Self-pay | Admitting: Family Medicine

## 2019-01-15 DIAGNOSIS — R42 Dizziness and giddiness: Secondary | ICD-10-CM | POA: Diagnosis not present

## 2019-01-15 DIAGNOSIS — G8929 Other chronic pain: Secondary | ICD-10-CM | POA: Diagnosis not present

## 2019-01-15 DIAGNOSIS — M545 Low back pain, unspecified: Secondary | ICD-10-CM

## 2019-01-15 DIAGNOSIS — S32010A Wedge compression fracture of first lumbar vertebra, initial encounter for closed fracture: Secondary | ICD-10-CM | POA: Diagnosis not present

## 2019-01-15 DIAGNOSIS — S6992XD Unspecified injury of left wrist, hand and finger(s), subsequent encounter: Secondary | ICD-10-CM | POA: Diagnosis not present

## 2019-01-15 DIAGNOSIS — E1165 Type 2 diabetes mellitus with hyperglycemia: Secondary | ICD-10-CM | POA: Diagnosis not present

## 2019-01-15 DIAGNOSIS — I677 Cerebral arteritis, not elsewhere classified: Secondary | ICD-10-CM | POA: Diagnosis not present

## 2019-01-15 DIAGNOSIS — R296 Repeated falls: Secondary | ICD-10-CM | POA: Diagnosis not present

## 2019-01-15 DIAGNOSIS — M4856XD Collapsed vertebra, not elsewhere classified, lumbar region, subsequent encounter for fracture with routine healing: Secondary | ICD-10-CM | POA: Diagnosis not present

## 2019-01-15 NOTE — Telephone Encounter (Signed)
Caller/Agency: [facility] Callback Number: [phone] Requesting OT/PT/Skilled Nursing/Social Work/Speech Therapy: PT at home  1 week 1 2 week 2  1 week 3

## 2019-01-17 NOTE — Telephone Encounter (Signed)
Please approve

## 2019-01-18 ENCOUNTER — Encounter: Payer: Self-pay | Admitting: Oncology

## 2019-01-18 ENCOUNTER — Other Ambulatory Visit: Payer: Self-pay

## 2019-01-18 ENCOUNTER — Inpatient Hospital Stay: Payer: Medicare Other | Attending: Oncology | Admitting: Oncology

## 2019-01-18 DIAGNOSIS — R740 Nonspecific elevation of levels of transaminase and lactic acid dehydrogenase [LDH]: Secondary | ICD-10-CM | POA: Insufficient documentation

## 2019-01-18 DIAGNOSIS — Z8572 Personal history of non-Hodgkin lymphomas: Secondary | ICD-10-CM | POA: Diagnosis not present

## 2019-01-18 DIAGNOSIS — Z9221 Personal history of antineoplastic chemotherapy: Secondary | ICD-10-CM | POA: Diagnosis not present

## 2019-01-18 DIAGNOSIS — Z8579 Personal history of other malignant neoplasms of lymphoid, hematopoietic and related tissues: Secondary | ICD-10-CM

## 2019-01-19 ENCOUNTER — Other Ambulatory Visit: Payer: Medicare Other

## 2019-01-19 ENCOUNTER — Ambulatory Visit: Payer: Medicare Other | Admitting: Oncology

## 2019-01-19 ENCOUNTER — Ambulatory Visit (INDEPENDENT_AMBULATORY_CARE_PROVIDER_SITE_OTHER): Payer: Medicare Other | Admitting: Pharmacist

## 2019-01-19 DIAGNOSIS — E1165 Type 2 diabetes mellitus with hyperglycemia: Secondary | ICD-10-CM

## 2019-01-19 DIAGNOSIS — Z794 Long term (current) use of insulin: Secondary | ICD-10-CM | POA: Diagnosis not present

## 2019-01-19 DIAGNOSIS — M069 Rheumatoid arthritis, unspecified: Secondary | ICD-10-CM | POA: Diagnosis not present

## 2019-01-19 NOTE — Telephone Encounter (Signed)
Leme[place] notified

## 2019-01-19 NOTE — Chronic Care Management (AMB) (Signed)
  Chronic Care Management   Follow Up Note   01/19/2019 Name: Matea Stanard MRN: [id number] DOB: [birth year]  Referred by: Hubbard Hartshorn, FNP Reason for referral : Chronic Care Management (Initial pharmacy consult)   Maira Christon is a 68 y.o. year old female who is a primary care patient of Hubbard Hartshorn, FNP. The CCM clinical pharmacist was consulted to assist with patient's request for Freestyle Libre (CGM)   Review of patient status, including review of consultants reports, relevant laboratory and other test results, and collaboration with appropriate care team members and the patient's provider was performed as part of comprehensive patient evaluation and provision of chronic care management services.    Goals Addressed            This Visit's Progress   . I would like a Freestyle Libre (pt-stated)       Current Barriers:  . Checks blood sugar at least four times daily . Tremors in right hand makes it difficult to check blood sugar in that hand  Pharmacist Clinical Goal(s):  Marland Kitchen Over the next 30 days, patient will work with pharmacist, prescriber [name], and medical supply company to address needs related to obtaining a Freestyle Libre  Interventions: . Patient declined comprehensive medication review . Pharmacist explained Medicare Parts A,B, and D and in general how Freestyle Elenor Legato is billed through Part B through specific pharmacy contracts o Despite coming from a pharmacy and needing a prescription, Freestyle Elenor Legato is NOT covered by prescription drug Medicare coverage  Patient Self Care Activities:  . Contact [employer] to determine coverage (if any) for [employer] . Continue taking medications as prescribed  Initial goal documentation        Follow up Pharmacist to follow up with provider on Friday, June 26  Pharmacist to follow up with patient via telephone in 1 week  Ruben Reason, PharmD Clinical Pharmacist [place] [phone]

## 2019-01-19 NOTE — Progress Notes (Signed)
HEMATOLOGY-ONCOLOGY TeleHEALTH VISIT PROGRESS NOTE  I connected with Victoria Austin on 01/19/19 at  2:45 PM EDT by video enabled telemedicine visit and verified that I am speaking with the correct person using two identifiers. I discussed the limitations, risks, security and privacy concerns of performing an evaluation and management service by telemedicine and the availability of in-person appointments. I also discussed with the patient that there may be a patient responsible charge related to this service. The patient expressed understanding and agreed to proceed.   Other persons participating in the visit and their role in the encounter:  Geraldine Solar, [place], check in patient   Janeann Merl, RN, check in patient.   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 female who has above history reviewed by me today presents for follow up visit for history of lymphoma. Problems and complaints are listed below:  Obtained patient's medical records from previous oncologist office. Extensive medical record review was performed. Patient developed abdominal symptoms and underwent CT of the abdomen which showed extensive periaortic lymphadenopathy.  Underwent exploratory laparotomy with lymph node biopsy on June 11, 2012 which showed classic Hodgkin lymphoma, nodular sclerosis type.  Also had multiple enlarged lymph nodes within the CT on a subsequent CT chest on 06/12/2012.  Bone marrow biopsy and aspiration on 06/29/2012 showed no evidence of lymphoma.  PET scan on 07/09/2012 showed hypermetabolic lymphadenopathy in both supraclavicular regions, chest and abdomen.Status post 6 cycles of ABVD completed 12/22/2012 and has been staying in remission since then.  Today patient reports no new complaints. She wears braces and following up with spine specialty for management of  compression fracture secondary to fall.  Review of Systems  Constitutional: Negative for appetite change, chills, fatigue and fever.  HENT:   Negative for hearing loss and voice change.   Eyes: Negative for eye problems.  Respiratory: Negative for chest tightness and cough.   Cardiovascular: Negative for chest pain.  Gastrointestinal: Negative for abdominal distention, abdominal pain and blood in stool.  Endocrine: Negative for hot flashes.  Genitourinary: Negative for difficulty urinating and frequency.   Musculoskeletal: Positive for back pain. Negative for arthralgias.  Skin: Negative for itching and rash.  Neurological: Negative for extremity weakness.  Hematological: Negative for adenopathy.  Psychiatric/Behavioral: Negative for confusion.    Past Medical History:  Diagnosis Date  . Anxiety   . Arthritis   . Bipolar 1 disorder ([place]) 2006  . Depression   . Diabetes mellitus without complication ([place])   . Fibromyalgia   . GERD (gastroesophageal reflux disease)   . Hodgkin's lymphoma ([place])   . Hyperlipidemia   . IBS (irritable bowel syndrome)   . Incontinence   . RA (rheumatoid arthritis) ([place])    Past Surgical History:  Procedure Laterality Date  . BACK SURGERY    . CESAREAN SECTION    . ELBOW ARTHROSCOPY    . FINGER SURGERY    . HAND SURGERY    . HERNIA REPAIR    . SPINE SURGERY      Family History  Problem Relation Age of Onset  . Lung cancer Mother   . Hypertension Mother   . Hypertension Sister   . Hypertension Brother   . Bone cancer Maternal Aunt   . Colon cancer Maternal Uncle     Social History   Socioeconomic History  . Marital status: Legally Separated    Spouse  name: Not on file  . Number of children: 2  . Years of education: Not on file  . Highest education level: Not on file  Occupational History  . Occupation: retired  Scientific laboratory technician  . Financial resource strain: Not hard at all  . Food insecurity    Worry: Never true    Inability: Never  true  . Transportation needs    Medical: No    Non-medical: No  Tobacco Use  . Smoking status: Never Smoker  . Smokeless tobacco: Never Used  Substance and Sexual Activity  . Alcohol use: Never    Frequency: Never  . Drug use: Never  . Sexual activity: Not Currently    Partners: Male  Lifestyle  . Physical activity    Days per week: 0 days    Minutes per session: 0 min  . Stress: Very much  Relationships  . Social connections    Talks on phone: More than three times a week    Gets together: More than three times a week    Attends religious service: Never    Active member of club or organization: No    Attends meetings of clubs or organizations: Never    Relationship status: Separated  . Intimate partner violence    Fear of current or ex partner: No    Emotionally abused: No    Physically abused: No    Forced sexual activity: No  Other Topics Concern  . Not on file  Social History Narrative  . Not on file    Current Outpatient Medications on File Prior to Visit  Medication Sig Dispense Refill  . ADVAIR HFA 230-21 MCG/ACT inhaler Inhale 1 puff into the lungs 2 (two) times daily. 1 Inhaler 3  . albuterol (PROAIR HFA) 108 (90 Base) MCG/ACT inhaler Take 1 puff by mouth 2 (two) times daily as needed.    Marland Kitchen buPROPion (WELLBUTRIN) 100 MG tablet Take 100 mg by mouth daily.    . carbamazepine (TEGRETOL) 200 MG tablet Take 1 tablet by mouth 3 (three) times daily.    . cloNIDine (CATAPRES) 0.1 MG tablet Take 0.1 mg by mouth daily as needed.     . clopidogrel (PLAVIX) 75 MG tablet Take 1 tablet (75 mg total) by mouth daily. 90 tablet 1  . Continuous Blood Gluc Receiver (FREESTYLE LIBRE 14 DAY READER) DEVI 1 Device by Does not apply route 4 (four) times daily. 1 Device 0  . Continuous Blood Gluc Sensor (FREESTYLE LIBRE 14 DAY SENSOR) MISC 1 Device by Does not apply route every 14 (fourteen) days. 6 each 1  . DULoxetine (CYMBALTA) 60 MG capsule Take 1 capsule by mouth daily.    Marland Kitchen  EPINEPHrine (EPIPEN 2-PAK) 0.3 mg/0.3 mL IJ SOAJ injection Inject 0.3 mLs (0.3 mg total) into the muscle as needed for anaphylaxis. Then call 911 for ER transport. 0.3 mL 1  . gabapentin (NEURONTIN) 300 MG capsule Take 1 capsule by mouth daily.    . hydrALAZINE (APRESOLINE) 50 MG tablet Take 1 tablet (50 mg total) by mouth 3 (three) times daily. 90 tablet 1  . Insulin Glargine (LANTUS SOLOSTAR) 100 UNIT/ML Solostar Pen Inject 80 Units into the skin daily. 10 pen 1  . lisinopril (ZESTRIL) 5 MG tablet Take 1 tablet (5 mg total) by mouth daily. 90 tablet 1  . loratadine (CLARITIN) 10 MG tablet Take 1 tablet (10 mg total) by mouth daily. 90 tablet 1  . LORazepam (ATIVAN) 1 MG tablet Take 1 tablet by mouth  daily.    Marland Kitchen MAGNESIUM-OXIDE 400 (241.3 Mg) MG tablet Take 1 tablet (400 mg total) by mouth daily. 90 tablet 0  . meclizine (ANTIVERT) 25 MG tablet Take 1 tablet (25 mg total) by mouth 3 (three) times daily as needed for dizziness. (Patient taking differently: Take 25 mg by mouth 3 (three) times daily as needed for dizziness. 2 tablets in the morning, 1 tablet in the afternoon) 90 tablet 0  . methocarbamol (ROBAXIN) 750 MG tablet Take 1 tablet (750 mg total) by mouth 2 (two) times daily as needed for muscle spasms. (Patient taking differently: Take 750 mg by mouth 3 (three) times daily. ) 90 tablet 0  . montelukast (SINGULAIR) 10 MG tablet Take 1 tablet (10 mg total) by mouth daily. 90 tablet 0  . MYRBETRIQ 25 MG TB24 tablet Take 1 tablet (25 mg total) by mouth daily. 90 tablet 0  . NIFEdipine (ADALAT CC) 60 MG 24 hr tablet Take 1 tablet (60 mg total) by mouth 2 (two) times daily. 180 tablet 1  . NOVOLOG FLEXPEN 100 UNIT/ML FlexPen Inject 14 Units into the skin 3 (three) times daily. With meals (Patient taking differently: Inject 14 Units into the skin 3 (three) times daily. With meals per sliding scale) 15 mL 1  . omega-3 acid ethyl esters (LOVAZA) 1 g capsule Take 2 capsules (2 g total) by mouth 2 (two)  times daily. (Patient taking differently: 4 capsules at bedtime) 360 capsule 1  . pantoprazole (PROTONIX) 40 MG tablet Take 1 tablet by mouth daily.    . primidone (MYSOLINE) 250 MG tablet Take 200 mg by mouth at bedtime.     . promethazine (PHENERGAN) 25 MG tablet Take 1 tablet by mouth every 6 (six) hours as needed.     . propranolol (INDERAL) 10 MG tablet Take 1 tablet by mouth 2 (two) times a day.    . rosuvastatin (CRESTOR) 20 MG tablet Take 1 tablet (20 mg total) by mouth daily. 90 tablet 3  . SURE COMFORT PEN NEEDLES 31G X 5 MM MISC Inject 1 pen as directed 5 (five) times daily as needed. 100 each 3  . traZODone (DESYREL) 50 MG tablet Take 1 tablet by mouth at bedtime.    . Vitamin D, Ergocalciferol, (DRISDOL) 1.25 MG (50000 UT) CAPS capsule Take 1 capsule (50,000 Units total) by mouth once a week. 8 capsule 0  . HYDROcodone-acetaminophen (NORCO) 5-325 MG tablet Take 1 tablet by mouth every 4 (four) hours as needed for moderate pain. (Patient not taking: Reported on 01/18/2019) 20 tablet 0   No current facility-administered medications on file prior to visit.     Allergies  Allergen Reactions  . Ciprofloxacin Nausea And Vomiting  . Cyclobenzaprine Nausea And Vomiting  . Diazepam Nausea And Vomiting  . Ketorolac     Other reaction(s): Unknown  . Mushroom Extract Complex   . Neomycin-Polymyxin-Gramicidin   . Ranitidine Hcl   . Ondansetron Nausea And Vomiting       Observations/Objective: There were no vitals filed for this visit. There is no height or weight on file to calculate BMI.  Physical Exam  Constitutional: She is oriented to person, place, and time. No distress.  HENT:  Head: Normocephalic and atraumatic.  Pulmonary/Chest: Effort normal.  Neurological: She is alert and oriented to person, place, and time.  Psychiatric: Affect normal.    CBC    Component Value Date/Time   WBC 5.8 12/22/2018 1153   RBC 3.81 (L) 12/22/2018 1153  HGB 10.8 (L) 12/22/2018 1153    HCT 32.4 (L) 12/22/2018 1153   PLT 264 12/22/2018 1153   MCV 85.0 12/22/2018 1153   MCH 28.3 12/22/2018 1153   MCHC 33.3 12/22/2018 1153   RDW 13.1 12/22/2018 1153   LYMPHSABS 2.0 12/22/2018 1153   MONOABS 0.5 12/22/2018 1153   EOSABS 0.2 12/22/2018 1153   BASOSABS 0.1 12/22/2018 1153    CMP     Component Value Date/Time   NA 135 12/22/2018 1153   K 4.1 12/22/2018 1153   CL 101 12/22/2018 1153   CO2 25 12/22/2018 1153   GLUCOSE 112 (H) 12/22/2018 1153   BUN 17 12/22/2018 1153   CREATININE 0.99 12/22/2018 1153   CREATININE 0.89 12/11/2018 1020   CALCIUM 8.9 12/22/2018 1153   PROT 8.1 12/22/2018 1153   ALBUMIN 4.2 12/22/2018 1153   AST 25 12/22/2018 1153   ALT 9 12/22/2018 1153   ALKPHOS 98 12/22/2018 1153   BILITOT 0.6 12/22/2018 1153   GFRNONAA 59 (L) 12/22/2018 1153   GFRNONAA 68 12/11/2018 1020   GFRAA >60 12/22/2018 1153   GFRAA 78 12/11/2018 1020     Assessment and Plan: 1. History of lymphoma     Medical record review was performed by me. 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  Finished in 2014.  Patient has stayed in remission since then. She is now 6 years out Images as indicated. Labs reviewed and discussed. Slightly elevated LDH, and ESR, nonspecific. Recommend repeat blood work in 3 months.   Follow Up Instructions:  3 months  I discussed the assessment and treatment plan with the patient. The patient was provided an opportunity to ask questions and all were answered. The patient agreed with the plan and demonstrated an understanding of the instructions.  The patient was advised to call back or seek an in-person evaluation if the symptoms worsen or if the condition fails to improve as anticipated.   I provided 15 minutes of face-to-face video visit time during this encounter, and > 50% was spent counseling as documented under my assessment & plan.  Earlie Server, MD 01/19/2019 10:26 PM

## 2019-01-20 ENCOUNTER — Telehealth: Payer: Self-pay | Admitting: Family Medicine

## 2019-01-20 DIAGNOSIS — S6992XD Unspecified injury of left wrist, hand and finger(s), subsequent encounter: Secondary | ICD-10-CM | POA: Diagnosis not present

## 2019-01-20 DIAGNOSIS — I677 Cerebral arteritis, not elsewhere classified: Secondary | ICD-10-CM | POA: Diagnosis not present

## 2019-01-20 DIAGNOSIS — R296 Repeated falls: Secondary | ICD-10-CM | POA: Diagnosis not present

## 2019-01-20 DIAGNOSIS — M4856XD Collapsed vertebra, not elsewhere classified, lumbar region, subsequent encounter for fracture with routine healing: Secondary | ICD-10-CM | POA: Diagnosis not present

## 2019-01-20 DIAGNOSIS — R42 Dizziness and giddiness: Secondary | ICD-10-CM | POA: Diagnosis not present

## 2019-01-20 DIAGNOSIS — M069 Rheumatoid arthritis, unspecified: Secondary | ICD-10-CM

## 2019-01-20 DIAGNOSIS — E1165 Type 2 diabetes mellitus with hyperglycemia: Secondary | ICD-10-CM | POA: Diagnosis not present

## 2019-01-20 NOTE — Telephone Encounter (Signed)
Victoria Austin called from [facility] to state that patient is requesting a rollator walker with seat. [name] call back # [phone] [facility] Fax [phone]

## 2019-01-21 NOTE — Telephone Encounter (Signed)
Victoria Austin is from PT and requested, order given

## 2019-01-21 NOTE — Telephone Encounter (Signed)
I requested a PT evaluation.  If PT recommends this, then please approve.

## 2019-01-22 ENCOUNTER — Ambulatory Visit: Payer: Medicare Other | Admitting: Pharmacist

## 2019-01-22 DIAGNOSIS — M4856XD Collapsed vertebra, not elsewhere classified, lumbar region, subsequent encounter for fracture with routine healing: Secondary | ICD-10-CM | POA: Diagnosis not present

## 2019-01-22 DIAGNOSIS — E1165 Type 2 diabetes mellitus with hyperglycemia: Secondary | ICD-10-CM | POA: Diagnosis not present

## 2019-01-22 DIAGNOSIS — Z794 Long term (current) use of insulin: Secondary | ICD-10-CM

## 2019-01-22 DIAGNOSIS — I677 Cerebral arteritis, not elsewhere classified: Secondary | ICD-10-CM | POA: Diagnosis not present

## 2019-01-22 DIAGNOSIS — R42 Dizziness and giddiness: Secondary | ICD-10-CM | POA: Diagnosis not present

## 2019-01-22 DIAGNOSIS — S6992XD Unspecified injury of left wrist, hand and finger(s), subsequent encounter: Secondary | ICD-10-CM | POA: Diagnosis not present

## 2019-01-22 DIAGNOSIS — M79642 Pain in left hand: Secondary | ICD-10-CM

## 2019-01-22 DIAGNOSIS — R296 Repeated falls: Secondary | ICD-10-CM | POA: Diagnosis not present

## 2019-01-22 NOTE — Chronic Care Management (AMB) (Signed)
  Chronic Care Management   Note  01/22/2019 Name: Victoria Austin MRN: [id number] DOB: [birth year]  Care Coordination Note:   Patient meets CMS qulifications for CGM:   T2DM  Has had provider appointment in the past 6 months  4 BG checks per day  treated with insulin requiring multiple (3 or more injections per day)   uses both basal and prandail insulin (Lantus and Novolog)   Goals Addressed            This Visit's Progress   . I would like a Freestyle Libre (pt-stated)       Current Barriers:  . Checks blood sugar at least four times daily . Tremors in right hand makes it difficult to check blood sugar in that hand  Pharmacist Clinical Goal(s):  Marland Kitchen Over the next 30 days, patient will work with pharmacist, prescriber [name], and medical supply company to address needs related to obtaining a Freestyle Libre  Interventions: . Patient declined comprehensive medication review . Pharmacist explained Medicare Parts A,B, and D and in general how Freestyle Elenor Legato is billed through Part B through specific pharmacy contracts o Despite coming from a pharmacy and needing a prescription, Freestyle Elenor Legato is NOT covered by prescription drug Medicare coverage o May be covered through her Tricare benefit, but not through [employer] o DME supplier: [employer]: [phone]  Patient Self Care Activities:  . Contact EnvisionRx/Tricare to determine coverage (if any) for [employer] . Continue taking medications as prescribed  Please see past updates related to this goal by clicking on the "Past Updates" button in the selected goal           Follow up plan: Telephone follow up appointment with care management team member scheduled for: telephone follow up with PharmD on June 30 (patient is following up on costs via [facility])  Ruben Reason, PharmD Clinical Pharmacist [facility] [phone]

## 2019-01-26 ENCOUNTER — Ambulatory Visit: Payer: Medicare Other | Admitting: Pharmacist

## 2019-01-26 DIAGNOSIS — R42 Dizziness and giddiness: Secondary | ICD-10-CM | POA: Diagnosis not present

## 2019-01-26 DIAGNOSIS — F3132 Bipolar disorder, current episode depressed, moderate: Secondary | ICD-10-CM | POA: Diagnosis not present

## 2019-01-26 DIAGNOSIS — S6992XD Unspecified injury of left wrist, hand and finger(s), subsequent encounter: Secondary | ICD-10-CM | POA: Diagnosis not present

## 2019-01-26 DIAGNOSIS — E1165 Type 2 diabetes mellitus with hyperglycemia: Secondary | ICD-10-CM | POA: Diagnosis not present

## 2019-01-26 DIAGNOSIS — Z794 Long term (current) use of insulin: Secondary | ICD-10-CM

## 2019-01-26 DIAGNOSIS — F411 Generalized anxiety disorder: Secondary | ICD-10-CM | POA: Diagnosis not present

## 2019-01-26 DIAGNOSIS — R296 Repeated falls: Secondary | ICD-10-CM | POA: Diagnosis not present

## 2019-01-26 DIAGNOSIS — M79642 Pain in left hand: Secondary | ICD-10-CM

## 2019-01-26 DIAGNOSIS — M4856XD Collapsed vertebra, not elsewhere classified, lumbar region, subsequent encounter for fracture with routine healing: Secondary | ICD-10-CM | POA: Diagnosis not present

## 2019-01-26 DIAGNOSIS — I677 Cerebral arteritis, not elsewhere classified: Secondary | ICD-10-CM | POA: Diagnosis not present

## 2019-01-26 NOTE — Patient Instructions (Signed)
Congratulations! You have met all case management goals! You may call the case management team at any time should you have a question or if you have new case management needs. We are happy to help you! We will let your doctor know that you have met your goals.    Thank you allowing the Chronic Care Management Team to be a part of your care!   Please call a member of the CCM (Chronic Care Management) Team with any questions or case management needs in the future:   Portia Payne, RN, BSN Nurse Care Coordinator  [phone]  Khadijah Mastrianni, PharmD  Clinical Pharmacist  [phone]   

## 2019-01-26 NOTE — Chronic Care Management (AMB) (Signed)
  Chronic Care Management   Follow Up Note   01/26/2019 Name: Victoria Austin MRN: [id number] DOB: 1951-07-08  Subjective Victoria Austin is a 68 y.o. year old female who is a primary care patient of Victoria Hartshorn, FNP. The CCM clinical pharmacist was consulted for helping patient obtain Freestyle Libre CGM.    Assessment Victoria Austin reports that she received her Freestyle Libre CGM from [employer] and did not have a copay using [facility]. She is very pleased. No further action required as far as paperwork goes for CCM pharmacist or Victoria Austin, [place].   Victoria Austin states she isn't sure how to use CGM but has upcoming appointment with Victoria Austin on 01/28/19. I will notify Victoria Austin so that she or CMA can instruct patient on how to use device as I will be out of office.   Goals Addressed            This Visit's Progress   . COMPLETED: I would like a Freestyle Libre (pt-stated)       Current Barriers:  . Checks blood sugar at least four times daily . Tremors in right hand makes it difficult to check blood sugar in that hand  Pharmacist Clinical Goal(s):  Marland Kitchen Over the next 30 days, patient will work with pharmacist, prescriber Victoria Austin, and medical supply company to address needs related to obtaining a Freestyle Libre  Interventions: . Patient declined comprehensive medication review . Pharmacist explained Medicare Parts A,B, and D and in general how Freestyle Elenor Legato is billed through Part B through specific pharmacy contracts o Despite coming from a pharmacy and needing a prescription, Freestyle Elenor Legato is NOT covered by prescription drug Medicare coverage o May be covered through her Tricare benefit, but not through [employer] o DME supplier: [employer]: [phone]  Patient Self Care Activities:  . Contact EnvisionRx/Tricare to determine coverage (if any) for [employer] . Continue taking medications as prescribed  Please see past updates related to this goal by clicking on  the "Past Updates" button in the selected goal          The patient has been provided with contact information for the care management team and has been advised to call with any health related questions or concerns.    Ruben Reason, PharmD Clinical Pharmacist [facility] [phone]

## 2019-01-28 ENCOUNTER — Ambulatory Visit (INDEPENDENT_AMBULATORY_CARE_PROVIDER_SITE_OTHER): Payer: Medicare Other | Admitting: Urology

## 2019-01-28 ENCOUNTER — Ambulatory Visit: Payer: Medicare Other | Admitting: Family Medicine

## 2019-01-28 ENCOUNTER — Encounter: Payer: Self-pay | Admitting: Urology

## 2019-01-28 ENCOUNTER — Encounter: Payer: Self-pay | Admitting: Family Medicine

## 2019-01-28 ENCOUNTER — Other Ambulatory Visit: Payer: Self-pay

## 2019-01-28 VITALS — BP 163/85 | HR 86 | Ht 64.0 in | Wt 176.0 lb

## 2019-01-28 DIAGNOSIS — R351 Nocturia: Secondary | ICD-10-CM | POA: Diagnosis not present

## 2019-01-28 DIAGNOSIS — Z5329 Procedure and treatment not carried out because of patient's decision for other reasons: Secondary | ICD-10-CM

## 2019-01-28 DIAGNOSIS — Z91199 Patient's noncompliance with other medical treatment and regimen due to unspecified reason: Secondary | ICD-10-CM

## 2019-01-28 LAB — MICROSCOPIC EXAMINATION
Bacteria, UA: NONE SEEN
RBC: NONE SEEN /hpf (ref 0–2)

## 2019-01-28 LAB — URINALYSIS, COMPLETE
Bilirubin, UA: NEGATIVE
Ketones, UA: NEGATIVE
Leukocytes,UA: NEGATIVE
Nitrite, UA: NEGATIVE
RBC, UA: NEGATIVE
Specific Gravity, UA: 1.01 (ref 1.005–1.030)
Urobilinogen, Ur: 0.2 mg/dL (ref 0.2–1.0)
pH, UA: 6.5 (ref 5.0–7.5)

## 2019-01-28 LAB — BLADDER SCAN AMB NON-IMAGING

## 2019-01-28 MED ORDER — TRIMETHOPRIM 100 MG PO TABS: 100.0000 mg | ORAL_TABLET | Freq: Every day | ORAL | 6 refills | Status: DC

## 2019-01-28 MED ORDER — MIRABEGRON ER 50 MG PO TB24: 50.0000 mg | ORAL_TABLET | Freq: Every day | ORAL | 11 refills | Status: DC

## 2019-01-28 NOTE — Progress Notes (Addendum)
01/28/2019 1:35 PM   [name] [birth year] [id number]  Referring provider: Hubbard Hartshorn, [address] [place],  [place] [address]  CC: Urinary urgency and recurrent UTIs  HPI: I saw [name] in urology clinic today in consultation for urinary urgency and recurrent UTIs from Victoria Ensign, NP.  She is a 68 year old comorbid female with past medical history notable for anxiety, bipolar disorder, depression, diabetes, stroke, sleep apnea, fibromyalgia, Hodgkin's lymphoma, and IBS who presents with severe urinary urgency and reportedly recurrent UTIs.  She has a complex urologic history.  She reportedly was followed by a Dr. Rolla Etienne in [place] and underwent urodynamics previously.  I do not have these records available to me.  She reportedly had a Foley catheter for approximately a year in 2015 with reported indication of frequent UTIs.  Her catheter was removed and she was doing well, however has had worsening urinary urgency and urge incontinence over the last few years.  She has been on Myrbetriq 25 mg which she feels may improve her symptoms moderately.  She drinks 2 cups of coffee in the morning, and 4-5 1 L bottles of water during the day.  Her primary complaint is nocturia 3-4 times per night, and severe urge incontinence requiring pads during the day.  She denies any gross hematuria or flank pain.  She is a never smoker.  She previously worked in [employer] and denies any other carcinogenic exposures.  She has been diagnosed with sleep apnea, but is non-compliant with her CPAP machine.  She is currently in a wheelchair secondary to a back fracture in March 2020.  There are no aggravating or alleviating factors.  Severity is severe.  Bladder scan in clinic today 0 mL.  She also reports 3 UTIs in the last 6 months.  The only culture available to me is enterobacter from May 2020.  Her symptoms with UTI are burning and pelvic pain.  She had an essentially negative colonoscopy  in March 2020, as well as a negative CT abdomen pelvis with contrast.   PMH: Past Medical History:  Diagnosis Date   Anxiety    Arthritis    Bipolar 1 disorder ([place]) 2006   Depression    Diabetes mellitus without complication ([place])    Fibromyalgia    GERD (gastroesophageal reflux disease)    Hodgkin's lymphoma ([facility])    Hyperlipidemia    IBS (irritable bowel syndrome)    Incontinence    RA (rheumatoid arthritis) ([employer])     Surgical History: Past Surgical History:  Procedure Laterality Date   BACK SURGERY     CESAREAN SECTION     ELBOW ARTHROSCOPY     FINGER SURGERY     HAND SURGERY     HERNIA REPAIR     SPINE SURGERY     Allergies:  Allergies  Allergen Reactions   Ciprofloxacin Nausea And Vomiting   Cyclobenzaprine Nausea And Vomiting   Diazepam Nausea And Vomiting   Ketorolac     Other reaction(s): Unknown   Mushroom Extract Complex    Neomycin-Polymyxin-Gramicidin    Ranitidine Hcl    Ondansetron Nausea And Vomiting    Family History: Family History  Problem Relation Age of Onset   Lung cancer Mother    Hypertension Mother    Hypertension Sister    Hypertension Brother    Bone cancer Maternal Aunt    Colon cancer Maternal Uncle     Social History:  reports that she has never smoked. She has  never used smokeless tobacco. She reports that she does not drink alcohol or use drugs.  ROS: Please see flowsheet from today's date for complete review of systems.  Physical Exam: BP (!) 163/85    Pulse 86    Ht 5\' 4"  (1.626 m)    Wt 176 lb (79.8 kg)    BMI 30.21 kg/m    Constitutional: In wheelchair, anxious, pressured speech Cardiovascular: No clubbing, cyanosis, or edema. Respiratory: Normal respiratory effort, no increased work of breathing. GI: Abdomen is soft, nontender, non-distended, no abdominal masses GU: No CVA tenderness Lymph: No cervical or inguinal lymphadenopathy. Skin: No rashes, bruises or suspicious  lesions. Neurologic: Grossly intact, no focal deficits, moving all 4 extremities.  Laboratory Data: Reviewed  Assessment & Plan:   In summary, the patient is a 68 year old female with extensive comorbidities and primary complaint of nocturia 3-4 times per night and severe urinary urgency and urge incontinence despite Myrbetriq 25 mg daily.  We discussed that overactive bladder (OAB) is not a disease, but is a symptom complex that is generally not life-threatening.  Symptoms typically include urinary urgency, frequency, and urge incontinence.  There are numerous treatment options, however there are risks and benefits with both medical and surgical management.  First-line treatment is behavioral therapies including bladder training, pelvic floor muscle training, and fluid management.  Second line treatments include oral antimuscarinics(Ditropan er, Trospium) and beta-3 agonist (Mybetriq). There is typically a period of medication trial (4-8 weeks) to find the optimal therapy and dosing. If symptoms are bothersome despite the above management, third line options include intra-detrusor botox, peripheral tibial nerve stimulation (PTNS), and interstim (SNS). These are more invasive treatments with higher side effect profile, but may improve quality of life for patients with severe OAB symptoms.   Increase Myrbetriq to 50 mg daily Behavioral strategies discussed at length including decreasing fluid intake during the day Strongly encouraged her to be more compliant with CPAP to improve nocturia Recommended cranberry prophylaxis for UTI prevention Start trimethoprim 100 mg daily for UTI prevention We will obtain outside urology records and urodynamic results RTC 3 months for symptom check  Billey Co, MD  [facility] [address], [place], [place] [address] [phone]

## 2019-01-28 NOTE — Patient Instructions (Signed)
1. Decrease your fluid intake. Drink when thirsty, and minimize fluids after 7pm and urinate before bed. 2. Use your CPAP machine for sleep apnea to decrease your overnight urination 3. Start cranberry tablets twice daily 4. Start low dose antibiotic to prevent UTIs   Overactive Bladder, Adult  Overactive bladder refers to a condition in which a person has a sudden need to pass urine. The person may leak urine if he or she cannot get to the bathroom fast enough (urinary incontinence). A person with this condition may also wake up several times in the night to go to the bathroom. Overactive bladder is associated with poor nerve signals between your bladder and your brain. Your bladder may get the signal to empty before it is full. You may also have very sensitive muscles that make your bladder squeeze too soon. These symptoms might interfere with daily work or social activities. What are the causes? This condition may be associated with or caused by:  Urinary tract infection.  Infection of nearby tissues, such as the prostate.  Prostate enlargement.  Surgery on the uterus or urethra.  Bladder stones, inflammation, or tumors.  Drinking too much caffeine or alcohol.  Certain medicines, especially medicines that get rid of extra fluid in the body (diuretics).  Muscle or nerve weakness, especially from: ? A spinal cord injury. ? Stroke. ? Multiple sclerosis. ? Parkinson's disease.  Diabetes.  Constipation. What increases the risk? You may be at greater risk for overactive bladder if you:  Are an older adult.  Smoke.  Are going through menopause.  Have prostate problems.  Have a neurological disease, such as stroke, dementia, Parkinson's disease, or multiple sclerosis (MS).  Eat or drink things that irritate the bladder. These include alcohol, spicy food, and caffeine.  Are overweight or obese. What are the signs or symptoms? Symptoms of this condition include:   Sudden, strong urge to urinate.  Leaking urine.  Urinating 8 or more times a day.  Waking up to urinate 2 or more times a night. How is this diagnosed? Your health care provider may suspect overactive bladder based on your symptoms. He or she will diagnose this condition by:  A physical exam and medical history.  Blood or urine tests. You might need bladder or urine tests to help determine what is causing your overactive bladder. You might also need to see a health care provider who specializes in urinary tract problems (urologist). How is this treated? Treatment for overactive bladder depends on the cause of your condition and whether it is mild or severe. You can also make lifestyle changes at home. Options include:  Bladder training. This may include: ? Learning to control the urge to urinate by following a schedule that directs you to urinate at regular intervals (timed voiding). ? Doing Kegel exercises to strengthen your pelvic floor muscles, which support your bladder. Toning these muscles can help you control urination, even if your bladder muscles are overactive.  Special devices. This may include: ? Biofeedback, which uses sensors to help you become aware of your body's signals. ? Electrical stimulation, which uses electrodes placed inside the body (implanted) or outside the body. These electrodes send gentle pulses of electricity to strengthen the nerves or muscles that control the bladder. ? Women may use a plastic device that fits into the vagina and supports the bladder (pessary).  Medicines. ? Antibiotics to treat bladder infection. ? Antispasmodics to stop the bladder from releasing urine at the wrong time. ? Tricyclic antidepressants  to relax bladder muscles. ? Injections of botulinum toxin type A directly into the bladder tissue to relax bladder muscles.  Lifestyle changes. This may include: ? Weight loss. Talk to your health care provider about weight loss methods  that would work best for you. ? Diet changes. This may include reducing how much alcohol and caffeine you consume, or drinking fluids at different times of the day. ? Not smoking. Do not use any products that contain nicotine or tobacco, such as cigarettes and e-cigarettes. If you need help quitting, ask your health care provider.  Surgery. ? A device may be implanted to help manage the nerve signals that control urination. ? An electrode may be implanted to stimulate electrical signals in the bladder. ? A procedure may be done to change the shape of the bladder. This is done only in very severe cases. Follow these instructions at home: Lifestyle  Make any diet or lifestyle changes that are recommended by your health care provider. These may include: ? Drinking less fluid or drinking fluids at different times of the day. ? Cutting down on caffeine or alcohol. ? Doing Kegel exercises. ? Losing weight if needed. ? Eating a healthy and balanced diet to prevent constipation. This may include:  Eating foods that are high in fiber, such as fresh fruits and vegetables, whole grains, and beans.  Limiting foods that are high in fat and processed sugars, such as fried and sweet foods. General instructions  Take over-the-counter and prescription medicines only as told by your health care provider.  If you were prescribed an antibiotic medicine, take it as told by your health care provider. Do not stop taking the antibiotic even if you start to feel better.  Use any implants or pessary as told by your health care provider.  If needed, wear pads to absorb urine leakage.  Keep a journal or log to track how much and when you drink and when you feel the need to urinate. This will help your health care provider monitor your condition.  Keep all follow-up visits as told by your health care provider. This is important. Contact a health care provider if:  You have a fever.  Your symptoms do not get  better with treatment.  Your pain and discomfort get worse.  You have more frequent urges to urinate. Get help right away if:  You are not able to control your bladder. Summary  Overactive bladder refers to a condition in which a person has a sudden need to pass urine.  Several conditions may lead to an overactive bladder.  Treatment for overactive bladder depends on the cause and severity of your condition.  Follow your health care provider's instructions about lifestyle changes, doing Kegel exercises, keeping a journal, and taking medicines. This information is not intended to replace advice given to you by your health care provider. Make sure you discuss any questions you have with your health care provider. Document Released: 05/11/2009 Document Revised: 11/05/2018 Document Reviewed: 07/31/2017 Elsevier Patient Education  2020 [employer].

## 2019-01-29 DIAGNOSIS — R42 Dizziness and giddiness: Secondary | ICD-10-CM | POA: Diagnosis not present

## 2019-01-29 DIAGNOSIS — M4856XD Collapsed vertebra, not elsewhere classified, lumbar region, subsequent encounter for fracture with routine healing: Secondary | ICD-10-CM | POA: Diagnosis not present

## 2019-01-29 DIAGNOSIS — R296 Repeated falls: Secondary | ICD-10-CM | POA: Diagnosis not present

## 2019-01-29 DIAGNOSIS — E1165 Type 2 diabetes mellitus with hyperglycemia: Secondary | ICD-10-CM | POA: Diagnosis not present

## 2019-01-29 DIAGNOSIS — S6992XD Unspecified injury of left wrist, hand and finger(s), subsequent encounter: Secondary | ICD-10-CM | POA: Diagnosis not present

## 2019-01-29 DIAGNOSIS — I677 Cerebral arteritis, not elsewhere classified: Secondary | ICD-10-CM | POA: Diagnosis not present

## 2019-01-31 NOTE — Progress Notes (Signed)
erroneous

## 2019-02-02 DIAGNOSIS — I677 Cerebral arteritis, not elsewhere classified: Secondary | ICD-10-CM | POA: Diagnosis not present

## 2019-02-02 DIAGNOSIS — R296 Repeated falls: Secondary | ICD-10-CM | POA: Diagnosis not present

## 2019-02-02 DIAGNOSIS — E1165 Type 2 diabetes mellitus with hyperglycemia: Secondary | ICD-10-CM | POA: Diagnosis not present

## 2019-02-02 DIAGNOSIS — S6992XD Unspecified injury of left wrist, hand and finger(s), subsequent encounter: Secondary | ICD-10-CM | POA: Diagnosis not present

## 2019-02-02 DIAGNOSIS — M4856XD Collapsed vertebra, not elsewhere classified, lumbar region, subsequent encounter for fracture with routine healing: Secondary | ICD-10-CM | POA: Diagnosis not present

## 2019-02-02 DIAGNOSIS — R42 Dizziness and giddiness: Secondary | ICD-10-CM | POA: Diagnosis not present

## 2019-02-02 NOTE — Telephone Encounter (Signed)
Victoria Austin PT from [place] requesting orders for rollator walker please fax to [facility] fax # [phone]. Please follow up PT to inform confirmation [phone]

## 2019-02-02 NOTE — Telephone Encounter (Signed)
Left voicemail already sent in

## 2019-02-08 DIAGNOSIS — E1165 Type 2 diabetes mellitus with hyperglycemia: Secondary | ICD-10-CM | POA: Diagnosis not present

## 2019-02-08 DIAGNOSIS — M4856XD Collapsed vertebra, not elsewhere classified, lumbar region, subsequent encounter for fracture with routine healing: Secondary | ICD-10-CM | POA: Diagnosis not present

## 2019-02-08 DIAGNOSIS — Z7902 Long term (current) use of antithrombotics/antiplatelets: Secondary | ICD-10-CM | POA: Diagnosis not present

## 2019-02-08 DIAGNOSIS — I69011 Memory deficit following nontraumatic subarachnoid hemorrhage: Secondary | ICD-10-CM | POA: Diagnosis not present

## 2019-02-08 DIAGNOSIS — R42 Dizziness and giddiness: Secondary | ICD-10-CM | POA: Diagnosis not present

## 2019-02-08 DIAGNOSIS — R296 Repeated falls: Secondary | ICD-10-CM | POA: Diagnosis not present

## 2019-02-08 DIAGNOSIS — C8110 Nodular sclerosis classical Hodgkin lymphoma, unspecified site: Secondary | ICD-10-CM | POA: Diagnosis not present

## 2019-02-08 DIAGNOSIS — F419 Anxiety disorder, unspecified: Secondary | ICD-10-CM | POA: Diagnosis not present

## 2019-02-08 DIAGNOSIS — S6992XD Unspecified injury of left wrist, hand and finger(s), subsequent encounter: Secondary | ICD-10-CM | POA: Diagnosis not present

## 2019-02-08 DIAGNOSIS — I10 Essential (primary) hypertension: Secondary | ICD-10-CM | POA: Diagnosis not present

## 2019-02-08 DIAGNOSIS — M069 Rheumatoid arthritis, unspecified: Secondary | ICD-10-CM | POA: Diagnosis not present

## 2019-02-08 DIAGNOSIS — Z794 Long term (current) use of insulin: Secondary | ICD-10-CM | POA: Diagnosis not present

## 2019-02-08 DIAGNOSIS — Z87440 Personal history of urinary (tract) infections: Secondary | ICD-10-CM | POA: Diagnosis not present

## 2019-02-08 DIAGNOSIS — E11319 Type 2 diabetes mellitus with unspecified diabetic retinopathy without macular edema: Secondary | ICD-10-CM | POA: Diagnosis not present

## 2019-02-08 DIAGNOSIS — F3189 Other bipolar disorder: Secondary | ICD-10-CM | POA: Diagnosis not present

## 2019-02-08 DIAGNOSIS — Z9181 History of falling: Secondary | ICD-10-CM | POA: Diagnosis not present

## 2019-02-08 DIAGNOSIS — W19XXXD Unspecified fall, subsequent encounter: Secondary | ICD-10-CM | POA: Diagnosis not present

## 2019-02-08 DIAGNOSIS — I677 Cerebral arteritis, not elsewhere classified: Secondary | ICD-10-CM | POA: Diagnosis not present

## 2019-02-09 DIAGNOSIS — I677 Cerebral arteritis, not elsewhere classified: Secondary | ICD-10-CM | POA: Diagnosis not present

## 2019-02-09 DIAGNOSIS — S6992XD Unspecified injury of left wrist, hand and finger(s), subsequent encounter: Secondary | ICD-10-CM | POA: Diagnosis not present

## 2019-02-09 DIAGNOSIS — R42 Dizziness and giddiness: Secondary | ICD-10-CM | POA: Diagnosis not present

## 2019-02-09 DIAGNOSIS — M4856XD Collapsed vertebra, not elsewhere classified, lumbar region, subsequent encounter for fracture with routine healing: Secondary | ICD-10-CM | POA: Diagnosis not present

## 2019-02-09 DIAGNOSIS — E1165 Type 2 diabetes mellitus with hyperglycemia: Secondary | ICD-10-CM | POA: Diagnosis not present

## 2019-02-09 DIAGNOSIS — R296 Repeated falls: Secondary | ICD-10-CM | POA: Diagnosis not present

## 2019-02-11 ENCOUNTER — Telehealth: Payer: Self-pay | Admitting: Family Medicine

## 2019-02-11 NOTE — Telephone Encounter (Signed)
Pt needs to have new custom made shoes  She has 1 leg shorter than the other, has diabetes, and has had broken bones in the past.  She also has toe nails that grow abnormally  She needs this  Phone [phone] [facility] medical supply

## 2019-02-15 ENCOUNTER — Other Ambulatory Visit: Payer: Self-pay

## 2019-02-15 ENCOUNTER — Encounter: Payer: Self-pay | Admitting: Podiatry

## 2019-02-15 ENCOUNTER — Ambulatory Visit (INDEPENDENT_AMBULATORY_CARE_PROVIDER_SITE_OTHER): Payer: Medicare Other | Admitting: Podiatry

## 2019-02-15 DIAGNOSIS — I2510 Atherosclerotic heart disease of native coronary artery without angina pectoris: Secondary | ICD-10-CM

## 2019-02-15 DIAGNOSIS — L603 Nail dystrophy: Secondary | ICD-10-CM | POA: Diagnosis not present

## 2019-02-15 DIAGNOSIS — E1140 Type 2 diabetes mellitus with diabetic neuropathy, unspecified: Secondary | ICD-10-CM | POA: Insufficient documentation

## 2019-02-15 DIAGNOSIS — E1142 Type 2 diabetes mellitus with diabetic polyneuropathy: Secondary | ICD-10-CM | POA: Diagnosis not present

## 2019-02-15 DIAGNOSIS — D689 Coagulation defect, unspecified: Secondary | ICD-10-CM | POA: Diagnosis not present

## 2019-02-15 NOTE — Progress Notes (Signed)
This patient presents the office for an evaluation of her diabetic feet.  She says she is concerned about a nail that keeps growing in the center of her nail bed big toe right foot.  She says she frequently catches this and it is painful.  She recently asked her daughter to cut the nail down to help to prevent the nail from catching on her socks.  She has had previous nail surgeries and this nail spicule continues to regrow.  She also relates having calluses noted between her fourth and fifth digits but no problem today.  She also says that her blood sugar was 220 this morning due to new medication that she is taking.  Finally she has a history of a broken left foot following a stroke.  The foot has healed and the pain has resolved.  Finally patient request diabetic shoes.  She presents the office today for an exam of her diabetic feet. Patient is also taking plavix.  Vascular  Dorsalis pedis and posterior tibial pulses are palpable  B/L.  Capillary return  WNL.  Temperature gradient is  WNL.  Skin turgor  WNL  Sensorium  Senn Weinstein monofilament wire  Diminished/absent.. Diminished  tactile sensation.  Nail Exam  Patient has normal nails with no evidence of bacterial or fungal infection. Nail spicule in right hallux nail bed.  Absence of nail plate hallux left foot.  Orthopedic  Exam  Muscle tone and muscle strength  WNL.  No limitations of motion feet  B/L.  No crepitus or joint effusion noted.  Hammer toes second  B/l.  Bony prominences are unremarkable.  HAV  B/L.  Skin  No open lesions.  Normal skin texture and turgor.  Nail dystrophy.  Diabetes with neuropathy.  IE.  Patient requests that the nail spicule be permanently removed.  I discussed that with this patient and told her that we need to get her blood sugar lower and then I will gladly remove the nail spicule.  Patient also qualifies for diabetic shoes due to DPN HAV and Ht bilateral.  Patient is to make an appointment with Liliane Channel to be  measured for diabetic shoes.  Patient is to return to the office in 3 months for nail surgery.  Gardiner Barefoot DPM

## 2019-02-16 ENCOUNTER — Telehealth: Payer: Self-pay | Admitting: Family Medicine

## 2019-02-16 DIAGNOSIS — R42 Dizziness and giddiness: Secondary | ICD-10-CM | POA: Diagnosis not present

## 2019-02-16 DIAGNOSIS — R296 Repeated falls: Secondary | ICD-10-CM | POA: Diagnosis not present

## 2019-02-16 DIAGNOSIS — E782 Mixed hyperlipidemia: Secondary | ICD-10-CM

## 2019-02-16 DIAGNOSIS — E1165 Type 2 diabetes mellitus with hyperglycemia: Secondary | ICD-10-CM

## 2019-02-16 DIAGNOSIS — I677 Cerebral arteritis, not elsewhere classified: Secondary | ICD-10-CM | POA: Diagnosis not present

## 2019-02-16 DIAGNOSIS — S6992XD Unspecified injury of left wrist, hand and finger(s), subsequent encounter: Secondary | ICD-10-CM | POA: Diagnosis not present

## 2019-02-16 DIAGNOSIS — Z794 Long term (current) use of insulin: Secondary | ICD-10-CM

## 2019-02-16 DIAGNOSIS — E781 Pure hyperglyceridemia: Secondary | ICD-10-CM

## 2019-02-16 DIAGNOSIS — M4856XD Collapsed vertebra, not elsewhere classified, lumbar region, subsequent encounter for fracture with routine healing: Secondary | ICD-10-CM | POA: Diagnosis not present

## 2019-02-16 DIAGNOSIS — I2510 Atherosclerotic heart disease of native coronary artery without angina pectoris: Secondary | ICD-10-CM

## 2019-02-16 MED ORDER — ROSUVASTATIN CALCIUM 20 MG PO TABS: 20.0000 mg | ORAL_TABLET | Freq: Every day | ORAL | 3 refills | Status: DC

## 2019-02-16 MED ORDER — NIFEDIPINE ER 60 MG PO TB24: 60.0000 mg | ORAL_TABLET | Freq: Two times a day (BID) | ORAL | 1 refills | Status: DC

## 2019-02-16 MED ORDER — PANTOPRAZOLE SODIUM 40 MG PO TBEC: 40.0000 mg | DELAYED_RELEASE_TABLET | Freq: Every day | ORAL | 1 refills | Status: DC

## 2019-02-16 MED ORDER — LANTUS SOLOSTAR 100 UNIT/ML ~~LOC~~ SOPN: 80.0000 [IU] | PEN_INJECTOR | Freq: Every day | SUBCUTANEOUS | 1 refills | Status: DC

## 2019-02-16 MED ORDER — NOVOLOG FLEXPEN 100 UNIT/ML ~~LOC~~ SOPN: 14.0000 [IU] | PEN_INJECTOR | Freq: Three times a day (TID) | SUBCUTANEOUS | 3 refills | Status: DC

## 2019-02-16 NOTE — Telephone Encounter (Signed)
Medication Refill - Medication:  pantoprazole (PROTONIX) 40 MG tablet rosuvastatin (CRESTOR) 20 MG tablet NIFEdipine (ADALAT CC) 60 MG 24 hr tablet  NOVOLOG FLEXPEN 100 UNIT/ML FlexPen Insulin Glargine (LANTUS SOLOSTAR) 100 UNIT/ML Solostar Pen  Has the patient contacted their pharmacy? Yes. Advised to call office.   Preferred Pharmacy (with phone number or street name):  [facility], [address] [place] [address] [phone] (Phone) [phone] (Fax)   Agent: Please be advised that RX refills may take up to 3 business days. We ask that you follow-up with your pharmacy.

## 2019-02-16 NOTE — Telephone Encounter (Signed)
Pt is scheduled °

## 2019-02-16 NOTE — Telephone Encounter (Signed)
Please call patient to schedule her 3 month follow up - she is not on the schedule yet.

## 2019-02-23 ENCOUNTER — Telehealth: Payer: Self-pay | Admitting: Family Medicine

## 2019-02-23 DIAGNOSIS — Z794 Long term (current) use of insulin: Secondary | ICD-10-CM

## 2019-02-23 DIAGNOSIS — N183 Chronic kidney disease, stage 3 (moderate): Secondary | ICD-10-CM | POA: Diagnosis not present

## 2019-02-23 DIAGNOSIS — R809 Proteinuria, unspecified: Secondary | ICD-10-CM | POA: Diagnosis not present

## 2019-02-23 DIAGNOSIS — E1122 Type 2 diabetes mellitus with diabetic chronic kidney disease: Secondary | ICD-10-CM | POA: Diagnosis not present

## 2019-02-23 DIAGNOSIS — E1165 Type 2 diabetes mellitus with hyperglycemia: Secondary | ICD-10-CM

## 2019-02-23 NOTE — Telephone Encounter (Signed)
Medication Refill - Medication: propranolol (INDERAL) 10 MG tablet  Pt is completely out of medication. Pt requested refill previously, but the wrong rx was requested.  Please send in as soon as possible  Preferred Pharmacy:  [id number] [address], [place] [address]  [place] [address]  Phone: [phone] Fax: [phone]     Agent: Please be advised that RX refills may take up to 3 business days. We ask that you follow-up with your pharmacy.

## 2019-02-23 NOTE — Telephone Encounter (Signed)
Called patient to advise I could not fit her for DBS if she is being seen by NP.

## 2019-02-24 ENCOUNTER — Other Ambulatory Visit: Payer: Medicare Other | Admitting: Orthotics

## 2019-02-24 NOTE — Telephone Encounter (Signed)
Yes - may provide this.

## 2019-02-24 NOTE — Telephone Encounter (Signed)
There is another message where patient want diabetic shoes can we write a prescription and have Dr.Sowles sign

## 2019-02-25 DIAGNOSIS — F3132 Bipolar disorder, current episode depressed, moderate: Secondary | ICD-10-CM | POA: Diagnosis not present

## 2019-02-25 DIAGNOSIS — F411 Generalized anxiety disorder: Secondary | ICD-10-CM | POA: Diagnosis not present

## 2019-03-01 ENCOUNTER — Encounter: Payer: Self-pay | Admitting: Family Medicine

## 2019-03-01 ENCOUNTER — Ambulatory Visit (INDEPENDENT_AMBULATORY_CARE_PROVIDER_SITE_OTHER): Payer: Medicare Other | Admitting: Family Medicine

## 2019-03-01 ENCOUNTER — Other Ambulatory Visit: Payer: Self-pay

## 2019-03-01 DIAGNOSIS — R252 Cramp and spasm: Secondary | ICD-10-CM | POA: Diagnosis not present

## 2019-03-01 DIAGNOSIS — E785 Hyperlipidemia, unspecified: Secondary | ICD-10-CM | POA: Diagnosis not present

## 2019-03-01 DIAGNOSIS — I10 Essential (primary) hypertension: Secondary | ICD-10-CM | POA: Diagnosis not present

## 2019-03-01 DIAGNOSIS — F319 Bipolar disorder, unspecified: Secondary | ICD-10-CM | POA: Diagnosis not present

## 2019-03-01 DIAGNOSIS — E538 Deficiency of other specified B group vitamins: Secondary | ICD-10-CM | POA: Diagnosis not present

## 2019-03-01 DIAGNOSIS — K5900 Constipation, unspecified: Secondary | ICD-10-CM

## 2019-03-01 MED ORDER — PROPRANOLOL HCL 10 MG PO TABS: 10.0000 mg | ORAL_TABLET | Freq: Two times a day (BID) | ORAL | 0 refills | Status: DC

## 2019-03-01 NOTE — Telephone Encounter (Signed)
No medication pended - is there something that needs to be filled?

## 2019-03-01 NOTE — Progress Notes (Signed)
Name: Victoria Austin   MRN: [id number]    DOB: [birth year]   Date:03/01/2019       Progress Note  Subjective  Chief Complaint  Chief Complaint  Patient presents with  . Hypertension    running high  . Leg Pain    cramps in thighs bilateral    I connected with  [name] on 03/01/19 at 10:40 AM EDT by telephone and verified that I am speaking with the correct person using two identifiers.   I discussed the limitations, risks, security and privacy concerns of performing an evaluation and management service by telephone and the availability of in person appointments. Staff also discussed with the patient that there may be a patient responsible charge related to this service. Patient Location: Home Provider Location: None Additional Individuals present: Delsa Grana PA-C (in our office)  HPI  HTN: She presents with concern for significantly elevated BP's over the last 2-3 weeks. She notes BP 201/100's.  She has had PT coming to the house daily and he has measured her BP as too high to do her exercises - she did callibrate her cuff with her PT and hers seemed to be accurate. 157/83 today; last night it was 174/92; yesterday afternoon 157/77.  Average over the last 3 months 152/84 on her monitor.  She is currently taking lisinopril 10mg  daily (increased by Dr. [name] with nephrology. Clonidine 0.1mg  once daily PRN. Nifedipine 60mg  BID Hydralazine 50mg  TID She was supposed to be taking propanolol 10mg  BID, but she has been out for about 2 weeks.  Feeling jittery and like she is in a manic phase.  B12 Deficiency: Had been receiving injections prior to establishing care with [place].  We will check her levels today.  Bilateral upper thigh cramping: Has been happening for about 2 weeks as well; she is struggling with cramping intermittently, waking her at night.  Constipation and IBS: Used to see GI, but did not want to see them when she moved here.  She is taking stool softener PRN.   Patient  Active Problem List   Diagnosis Date Noted  . 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 Diabetic neuropathy ([place]) 02/15/2019  . Coagulation disorder ([place]) 02/15/2019  . Cerebrovascular accident (CVA) ([place]) 01/08/2019  . Incontinence   . RA (rheumatoid arthritis) ([place])   . Temporal arteritis ([place]) 01/01/2019  . Goals of care, counseling/discussion 12/26/2018  . Albuminuria 12/18/2018  . Type 2 diabetes mellitus with hyperglycemia, with long-term current use of insulin ([place]) 12/11/2018  . Compression fracture of L1 lumbar vertebra ([place]) 12/11/2018  . 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 Anxiety   . Depression   . Bipolar 1 disorder ([place]) 07/29/2004    Social History   Tobacco Use  . Smoking status: Never Smoker  . Smokeless tobacco: Never Used  Substance Use Topics  . Alcohol use: Never    Frequency: Never     Current Outpatient Medications:  .  ADVAIR HFA 616-07 MCG/ACT inhaler, Inhale 1 puff into the lungs 2 (two) times daily., Disp: 1 Inhaler, Rfl: 3 .  albuterol (PROAIR HFA) 108 (90 Base) MCG/ACT inhaler, Take 1 puff by mouth 2 (two) times daily as needed., Disp: , Rfl:  .  buPROPion (WELLBUTRIN) 100 MG tablet, Take 100 mg by mouth daily., Disp: , Rfl:  .  carbamazepine (TEGRETOL) 200 MG tablet, Take 1 tablet by mouth 3 (three) times daily., Disp: , Rfl:  .  cloNIDine (CATAPRES) 0.1 MG  tablet, Take 0.1 mg by mouth daily as needed. , Disp: , Rfl:  .  clopidogrel (PLAVIX) 75 MG tablet, Take 1 tablet (75 mg total) by mouth daily., Disp: 90 tablet, Rfl: 1 .  Continuous Blood Gluc Receiver (FREESTYLE LIBRE 14 DAY READER) DEVI, 1 Device by Does not apply route 4 (four) times daily., Disp: 1 Device, Rfl: 0 .  Continuous Blood Gluc Sensor (FREESTYLE LIBRE 14 DAY SENSOR) MISC, 1 Device by Does not apply route every 14 (fourteen) days., Disp: 6 each, Rfl: 1 .  DULoxetine (CYMBALTA) 60 MG capsule, Take 1 capsule by mouth daily., Disp: , Rfl:  .   EPINEPHrine (EPIPEN 2-PAK) 0.3 mg/0.3 mL IJ SOAJ injection, Inject 0.3 mLs (0.3 mg total) into the muscle as needed for anaphylaxis. Then call 911 for ER transport., Disp: 0.3 mL, Rfl: 1 .  gabapentin (NEURONTIN) 300 MG capsule, Take 1 capsule by mouth daily., Disp: , Rfl:  .  hydrALAZINE (APRESOLINE) 50 MG tablet, Take 1 tablet (50 mg total) by mouth 3 (three) times daily., Disp: 90 tablet, Rfl: 1 .  Insulin Glargine (LANTUS SOLOSTAR) 100 UNIT/ML Solostar Pen, Inject 80 Units into the skin daily., Disp: 10 pen, Rfl: 1 .  lisinopril (ZESTRIL) 5 MG tablet, Take 1 tablet (5 mg total) by mouth daily. (Patient taking differently: Take 10 mg by mouth daily. ), Disp: 90 tablet, Rfl: 1 .  loratadine (CLARITIN) 10 MG tablet, Take 1 tablet (10 mg total) by mouth daily., Disp: 90 tablet, Rfl: 1 .  LORazepam (ATIVAN) 1 MG tablet, Take 1 tablet by mouth daily., Disp: , Rfl:  .  MAGNESIUM-OXIDE 400 (241.3 Mg) MG tablet, Take 1 tablet (400 mg total) by mouth daily., Disp: 90 tablet, Rfl: 0 .  meclizine (ANTIVERT) 25 MG tablet, Take 1 tablet (25 mg total) by mouth 3 (three) times daily as needed for dizziness. (Patient taking differently: Take 25 mg by mouth 3 (three) times daily as needed for dizziness. 2 tablets in the morning, 1 tablet in the afternoon), Disp: 90 tablet, Rfl: 0 .  methocarbamol (ROBAXIN) 750 MG tablet, Take 1 tablet (750 mg total) by mouth 2 (two) times daily as needed for muscle spasms. (Patient taking differently: Take 750 mg by mouth 3 (three) times daily. ), Disp: 90 tablet, Rfl: 0 .  mirabegron ER (MYRBETRIQ) 50 MG TB24 tablet, Take 1 tablet (50 mg total) by mouth daily., Disp: 30 tablet, Rfl: 11 .  montelukast (SINGULAIR) 10 MG tablet, Take 1 tablet (10 mg total) by mouth daily., Disp: 90 tablet, Rfl: 0 .  NIFEdipine (ADALAT CC) 60 MG 24 hr tablet, Take 1 tablet (60 mg total) by mouth 2 (two) times daily., Disp: 180 tablet, Rfl: 1 .  NOVOLOG FLEXPEN 100 UNIT/ML FlexPen, Inject 14 Units into  the skin 3 (three) times daily. With meals, Disp: 3 pen, Rfl: 3 .  omega-3 acid ethyl esters (LOVAZA) 1 g capsule, Take 2 capsules (2 g total) by mouth 2 (two) times daily. (Patient taking differently: 4 capsules at bedtime), Disp: 360 capsule, Rfl: 1 .  pantoprazole (PROTONIX) 40 MG tablet, Take 1 tablet (40 mg total) by mouth daily., Disp: 90 tablet, Rfl: 1 .  primidone (MYSOLINE) 250 MG tablet, Take 200 mg by mouth at bedtime. , Disp: , Rfl:  .  promethazine (PHENERGAN) 25 MG tablet, Take 1 tablet by mouth every 6 (six) hours as needed. , Disp: , Rfl:  .  propranolol (INDERAL) 10 MG tablet, Take 1 tablet by mouth 2 (  two) times a day., Disp: , Rfl:  .  rosuvastatin (CRESTOR) 20 MG tablet, Take 1 tablet (20 mg total) by mouth daily., Disp: 90 tablet, Rfl: 3 .  SURE COMFORT PEN NEEDLES 31G X 5 MM MISC, Inject 1 pen as directed 5 (five) times daily as needed., Disp: 100 each, Rfl: 3 .  traZODone (DESYREL) 50 MG tablet, Take 1 tablet by mouth at bedtime., Disp: , Rfl:  .  trimethoprim (TRIMPEX) 100 MG tablet, Take 1 tablet (100 mg total) by mouth daily., Disp: 30 tablet, Rfl: 6 .  Vitamin D, Ergocalciferol, (DRISDOL) 1.25 MG (50000 UT) CAPS capsule, Take 1 capsule (50,000 Units total) by mouth once a week., Disp: 8 capsule, Rfl: 0  Allergies  Allergen Reactions  . Ciprofloxacin Nausea And Vomiting  . Cyclobenzaprine Nausea And Vomiting  . Diazepam Nausea And Vomiting  . Ketorolac     Other reaction(s): Unknown  . Mushroom Extract Complex   . Neomycin-Polymyxin-Gramicidin   . Ranitidine Hcl   . Ondansetron Nausea And Vomiting    I personally reviewed active problem list, medication list, allergies, notes from last encounter, lab results with the patient/caregiver today.  ROS  Constitutional: Negative for fever or weight change.  Respiratory: Negative for cough and shortness of breath.   Cardiovascular: Negative for chest pain or palpitations.  Gastrointestinal: Negative for abdominal  pain, no bowel changes.  Musculoskeletal: Negative for gait problem or joint swelling.  Skin: Negative for rash.  Neurological: Negative for dizziness or headache.  No other specific complaints in a complete review of systems (except as listed in HPI above).  Objective  Virtual encounter, vitals not obtained.  There is no height or weight on file to calculate BMI.  Nursing Note and Vital Signs reviewed.  Physical Exam  Pulmonary/Chest: Effort normal. No respiratory distress. Speaking in complete sentences Neurological: Pt is alert and oriented to person, place, and time. Speech is normal. Psychiatric: Patient has a normal mood and affect. behavior is normal. Judgment and thought content normal.   No results found for this or any previous visit (from the past 72 hour(s)).  Assessment & Plan  1. Bipolar 1 disorder (HCC) - propranolol (INDERAL) 10 MG tablet; Take 1 tablet (10 mg total) by mouth 2 (two) times daily.  Dispense: 60 tablet; Refill: 0  2. Essential hypertension - Come in today for labs and restart propanolol. - propranolol (INDERAL) 10 MG tablet; Take 1 tablet (10 mg total) by mouth 2 (two) times daily.  Dispense: 60 tablet; Refill: 0 - Ambulatory referral to Cardiology - COMPLETE METABOLIC PANEL WITH GFR - CBC with Differential/Platelet - TSH - B12 and Folate Panel  3. B12 deficiency - B12 and Folate Panel  4. Hyperlipidemia, unspecified hyperlipidemia type - Lipid panel  5. Bilateral leg cramps - COMPLETE METABOLIC PANEL WITH GFR - CBC with Differential/Platelet - B12 and Folate Panel - Magnesium  6. Constipation, unspecified constipation type - PRN Stool softener.   -Red flags and when to present for emergency care or RTC including fever >101.87F, chest pain, shortness of breath, new/worsening/un-resolving symptoms, reviewed with patient at time of visit. Follow up and care instructions discussed and provided in AVS. - I discussed the assessment and  treatment plan with the patient. The patient was provided an opportunity to ask questions and all were answered. The patient agreed with the plan and demonstrated an understanding of the instructions.  - The patient was advised to call back or seek an in-person evaluation if the symptoms  worsen or if the condition fails to improve as anticipated.  I provided 20 minutes of non-face-to-face time during this encounter.  Hubbard Hartshorn, FNP

## 2019-03-02 ENCOUNTER — Encounter: Payer: Self-pay | Admitting: Family Medicine

## 2019-03-02 ENCOUNTER — Other Ambulatory Visit: Payer: Self-pay | Admitting: Family Medicine

## 2019-03-02 DIAGNOSIS — F419 Anxiety disorder, unspecified: Secondary | ICD-10-CM

## 2019-03-02 LAB — COMPLETE METABOLIC PANEL WITH GFR
AG Ratio: 1.4 (calc) (ref 1.0–2.5)
ALT: 8 U/L (ref 6–29)
AST: 15 U/L (ref 10–35)
Albumin: 4.4 g/dL (ref 3.6–5.1)
Alkaline phosphatase (APISO): 131 U/L (ref 37–153)
BUN/Creatinine Ratio: 13 (calc) (ref 6–22)
BUN: 14 mg/dL (ref 7–25)
CO2: 24 mmol/L (ref 20–32)
Calcium: 9.6 mg/dL (ref 8.6–10.4)
Chloride: 95 mmol/L — ABNORMAL LOW (ref 98–110)
Creat: 1.06 mg/dL — ABNORMAL HIGH (ref 0.50–0.99)
GFR, Est African American: 63 mL/min/{1.73_m2} (ref >=60)
GFR, Est Non African American: 55 mL/min/{1.73_m2} — ABNORMAL LOW (ref >=60)
Globulin: 3.1 g/dL (calc) (ref 1.9–3.7)
Glucose, Bld: 294 mg/dL — ABNORMAL HIGH (ref 65–99)
Potassium: 4.5 mmol/L (ref 3.5–5.3)
Sodium: 134 mmol/L — ABNORMAL LOW (ref 135–146)
Total Bilirubin: 0.3 mg/dL (ref 0.2–1.2)
Total Protein: 7.5 g/dL (ref 6.1–8.1)

## 2019-03-02 LAB — CBC WITH DIFFERENTIAL/PLATELET
Absolute Monocytes: 475 cells/uL (ref 200–950)
Basophils Absolute: 53 cells/uL (ref 0–200)
Basophils Relative: 0.8 %
Eosinophils Absolute: 119 cells/uL (ref 15–500)
Eosinophils Relative: 1.8 %
HCT: 38.5 % (ref 35.0–45.0)
Hemoglobin: 12.9 g/dL (ref 11.7–15.5)
Lymphs Abs: 2231 cells/uL (ref 850–3900)
MCH: 28.5 pg (ref 27.0–33.0)
MCHC: 33.5 g/dL (ref 32.0–36.0)
MCV: 85.2 fL (ref 80.0–100.0)
MPV: 9.4 fL (ref 7.5–12.5)
Monocytes Relative: 7.2 %
Neutro Abs: 3722 cells/uL (ref 1500–7800)
Neutrophils Relative %: 56.4 %
Platelets: 274 10*3/uL (ref 140–400)
RBC: 4.52 10*6/uL (ref 3.80–5.10)
RDW: 13.7 % (ref 11.0–15.0)
Total Lymphocyte: 33.8 %
WBC: 6.6 10*3/uL (ref 3.8–10.8)

## 2019-03-02 LAB — MAGNESIUM: Magnesium: 1.6 mg/dL (ref 1.5–2.5)

## 2019-03-02 LAB — LIPID PANEL
Cholesterol: 186 mg/dL (ref <200)
HDL: 39 mg/dL — ABNORMAL LOW (ref >=50)
Non-HDL Cholesterol (Calc): 147 mg/dL (calc) — ABNORMAL HIGH (ref <130)
Total CHOL/HDL Ratio: 4.8 (calc) (ref <5.0)
Triglycerides: 413 mg/dL — ABNORMAL HIGH (ref <150)

## 2019-03-02 LAB — B12 AND FOLATE PANEL
Folate: 12.2 ng/mL
Vitamin B-12: 342 pg/mL (ref 200–1100)

## 2019-03-02 LAB — TSH: TSH: 1.68 mIU/L (ref 0.40–4.50)

## 2019-03-03 ENCOUNTER — Ambulatory Visit (INDEPENDENT_AMBULATORY_CARE_PROVIDER_SITE_OTHER): Payer: Medicare Other

## 2019-03-03 ENCOUNTER — Other Ambulatory Visit: Payer: Self-pay

## 2019-03-03 ENCOUNTER — Encounter: Payer: Self-pay | Admitting: Family Medicine

## 2019-03-03 DIAGNOSIS — E1165 Type 2 diabetes mellitus with hyperglycemia: Secondary | ICD-10-CM

## 2019-03-03 DIAGNOSIS — E538 Deficiency of other specified B group vitamins: Secondary | ICD-10-CM

## 2019-03-03 DIAGNOSIS — I639 Cerebral infarction, unspecified: Secondary | ICD-10-CM

## 2019-03-03 DIAGNOSIS — E781 Pure hyperglyceridemia: Secondary | ICD-10-CM

## 2019-03-03 DIAGNOSIS — E785 Hyperlipidemia, unspecified: Secondary | ICD-10-CM

## 2019-03-03 DIAGNOSIS — Z794 Long term (current) use of insulin: Secondary | ICD-10-CM

## 2019-03-03 MED ORDER — CYANOCOBALAMIN 1000 MCG/ML IJ SOLN
1000.0000 ug | Freq: Once | INTRAMUSCULAR | Status: AC
  Administered 2019-03-03: 1000 ug via INTRAMUSCULAR

## 2019-03-03 MED ORDER — HYDRALAZINE HCL 50 MG PO TABS: 50.0000 mg | ORAL_TABLET | Freq: Three times a day (TID) | ORAL | 1 refills | Status: DC

## 2019-03-03 NOTE — Telephone Encounter (Signed)
Please set patient up for monthly B12 shots.

## 2019-03-04 ENCOUNTER — Telehealth: Payer: Self-pay | Admitting: Family Medicine

## 2019-03-04 MED ORDER — ICOSAPENT ETHYL 1 G PO CAPS: 2.0000 | ORAL_CAPSULE | Freq: Two times a day (BID) | ORAL | 3 refills | Status: DC

## 2019-03-04 NOTE — Telephone Encounter (Signed)
Icosapent Ethyl 1 g CAPS  Pharmacy at [facility] ([place]) called states that this drug needs PA and you can get at [email or web address] ...fill out form and fax back to her at [phone] questions call her [phone] also needs to verify that she is not taking Lovaza (not seeing in med list?) [facility] [place], [place] [address] [place] [address] [phone] (Phone) [phone] (Fax)

## 2019-03-04 NOTE — Telephone Encounter (Signed)
Victoria Austin (Key: [id number]) Vascepa 1GM capsules   Form [employer] Electronic PA Form Created 3 minutes ago Sent to Plan 1 minute ago Plan Response less than a minute ago Submit Clinical Questions Determination N/A Message from Plan An active PA is already on file with expiration date of 07/28/2098. Please wait to resubmit request within 60 days of that expiration date to obtain a PA renewal.

## 2019-03-04 NOTE — Telephone Encounter (Signed)
Forwarded to referral coordinator for prior auth

## 2019-03-05 ENCOUNTER — Ambulatory Visit: Payer: Medicare Other | Admitting: Licensed Clinical Social Worker

## 2019-03-05 ENCOUNTER — Other Ambulatory Visit: Payer: Self-pay | Admitting: Urology

## 2019-03-05 ENCOUNTER — Encounter: Payer: Self-pay | Admitting: Family Medicine

## 2019-03-05 NOTE — Telephone Encounter (Signed)
Pt LMOM that she would like [facility] to refill Trimpex 100 mg (90 day supply) and send to [facility] in [place].

## 2019-03-08 MED ORDER — TRIMETHOPRIM 100 MG PO TABS: 100.0000 mg | ORAL_TABLET | Freq: Every day | ORAL | 1 refills | Status: DC

## 2019-03-08 NOTE — Telephone Encounter (Signed)
Pt stated the [place] [facility] [place] told her they still do not have the PA. Please advise or contact pt.  Also requesting gabapentin (NEURONTIN) 300 MG capsule be sent to [facility] as other provider was unable to send.  Pt also stated she has an appt at [facility] and the doctor will do another lumbar spine MRI and have a doctor on hand put cement in place so she does fracture anymore.

## 2019-03-08 NOTE — Telephone Encounter (Signed)
Script updated and sent

## 2019-03-08 NOTE — Telephone Encounter (Signed)
According to her insurance plan, she is all set.

## 2019-03-09 MED ORDER — GABAPENTIN 300 MG PO CAPS: 300.0000 mg | ORAL_CAPSULE | Freq: Every day | ORAL | 1 refills | Status: DC

## 2019-03-09 MED ORDER — VITAMIN D (ERGOCALCIFEROL) 1.25 MG (50000 UNIT) PO CAPS: 50000.0000 [IU] | ORAL_CAPSULE | ORAL | 1 refills | Status: DC

## 2019-03-09 MED ORDER — METHOCARBAMOL 750 MG PO TABS: 750.0000 mg | ORAL_TABLET | Freq: Two times a day (BID) | ORAL | 0 refills | Status: DC | PRN

## 2019-03-09 NOTE — Telephone Encounter (Signed)
Paperwork from CoverMyMeds will be faxed to this patient's preferred pharmacy with the approval on it that was received on 03/04/2019.  They also have their number listed if the pharmacy has any questions.

## 2019-03-10 DIAGNOSIS — F411 Generalized anxiety disorder: Secondary | ICD-10-CM | POA: Diagnosis not present

## 2019-03-10 DIAGNOSIS — F3132 Bipolar disorder, current episode depressed, moderate: Secondary | ICD-10-CM | POA: Diagnosis not present

## 2019-03-11 ENCOUNTER — Ambulatory Visit (INDEPENDENT_AMBULATORY_CARE_PROVIDER_SITE_OTHER): Payer: Medicare Other | Admitting: Licensed Clinical Social Worker

## 2019-03-11 ENCOUNTER — Other Ambulatory Visit: Payer: Self-pay

## 2019-03-11 DIAGNOSIS — F319 Bipolar disorder, unspecified: Secondary | ICD-10-CM
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
## 2019-03-16 ENCOUNTER — Telehealth: Payer: Self-pay | Admitting: Family Medicine

## 2019-03-16 NOTE — Telephone Encounter (Signed)
Medication Refill - Medication: mirabegron ER (MYRBETRIQ) 50 MG TB24 tablet & meclizine (ANTIVERT) 25 MG tablet     Has the patient contacted their pharmacy? Yes.  Pt states she is out of the medication. Please advise.  (Agent: If no, request that the patient contact the pharmacy for the refill.) (Agent: If yes, when and what did the pharmacy advise?)  Preferred Pharmacy (with phone number or street name):  [id number] [address], [address]  [place] [address]  Phone: [phone] Fax: [phone]  Not a 24 hour pharmacy; exact hours not known.     Agent: Please be advised that RX refills may take up to 3 business days. We ask that you follow-up with your pharmacy.

## 2019-03-17 ENCOUNTER — Other Ambulatory Visit: Payer: Self-pay | Admitting: Emergency Medicine

## 2019-03-17 MED ORDER — MIRABEGRON ER 50 MG PO TB24: 50.0000 mg | ORAL_TABLET | Freq: Every day | ORAL | 2 refills | Status: DC

## 2019-03-17 MED ORDER — MECLIZINE HCL 25 MG PO TABS: 25.0000 mg | ORAL_TABLET | Freq: Three times a day (TID) | ORAL | 1 refills | Status: DC | PRN

## 2019-03-17 NOTE — Telephone Encounter (Signed)
Order sent to [facility] for refill 

## 2019-03-18 ENCOUNTER — Encounter: Payer: Self-pay | Admitting: Family Medicine

## 2019-03-18 DIAGNOSIS — Z794 Long term (current) use of insulin: Secondary | ICD-10-CM

## 2019-03-18 DIAGNOSIS — E1165 Type 2 diabetes mellitus with hyperglycemia: Secondary | ICD-10-CM

## 2019-03-19 ENCOUNTER — Other Ambulatory Visit: Payer: Self-pay | Admitting: Emergency Medicine

## 2019-03-19 ENCOUNTER — Encounter: Payer: Self-pay | Admitting: Family Medicine

## 2019-03-19 DIAGNOSIS — I10 Essential (primary) hypertension: Secondary | ICD-10-CM

## 2019-03-19 DIAGNOSIS — F319 Bipolar disorder, unspecified: Secondary | ICD-10-CM

## 2019-03-19 DIAGNOSIS — F419 Anxiety disorder, unspecified: Secondary | ICD-10-CM

## 2019-03-19 MED ORDER — SURE COMFORT PEN NEEDLES 31G X 5 MM MISC: 1.0000 "pen " | Freq: Every day | 3 refills | Status: DC | PRN

## 2019-03-19 MED ORDER — LISINOPRIL 5 MG PO TABS: 10.0000 mg | ORAL_TABLET | Freq: Every day | ORAL | 3 refills | Status: DC

## 2019-03-19 MED ORDER — HYDRALAZINE HCL 50 MG PO TABS: 50.0000 mg | ORAL_TABLET | Freq: Three times a day (TID) | ORAL | 1 refills | Status: DC

## 2019-03-19 MED ORDER — MECLIZINE HCL 25 MG PO TABS: 25.0000 mg | ORAL_TABLET | Freq: Three times a day (TID) | ORAL | 1 refills | Status: DC | PRN

## 2019-03-19 MED ORDER — PROPRANOLOL HCL 10 MG PO TABS: 10.0000 mg | ORAL_TABLET | Freq: Two times a day (BID) | ORAL | 0 refills | Status: DC

## 2019-03-19 NOTE — Telephone Encounter (Signed)
BS has been running up to 300.

## 2019-03-24 ENCOUNTER — Ambulatory Visit: Payer: Medicare Other | Admitting: Orthotics

## 2019-03-24 ENCOUNTER — Encounter: Payer: Self-pay | Admitting: Family Medicine

## 2019-03-24 ENCOUNTER — Other Ambulatory Visit: Payer: Self-pay

## 2019-03-24 DIAGNOSIS — E1142 Type 2 diabetes mellitus with diabetic polyneuropathy: Secondary | ICD-10-CM

## 2019-03-24 DIAGNOSIS — L603 Nail dystrophy: Secondary | ICD-10-CM

## 2019-03-24 NOTE — Progress Notes (Signed)
Patient is being seen by NP...told her I would see her after she gets an appointment with either dr. Ruthine Dose or Lada in that practice.

## 2019-03-25 DIAGNOSIS — F3132 Bipolar disorder, current episode depressed, moderate: Secondary | ICD-10-CM | POA: Diagnosis not present

## 2019-03-25 DIAGNOSIS — F411 Generalized anxiety disorder: Secondary | ICD-10-CM | POA: Diagnosis not present

## 2019-03-26 ENCOUNTER — Other Ambulatory Visit: Payer: Self-pay

## 2019-03-26 ENCOUNTER — Ambulatory Visit (INDEPENDENT_AMBULATORY_CARE_PROVIDER_SITE_OTHER): Payer: Medicare Other | Admitting: Family Medicine

## 2019-03-26 ENCOUNTER — Encounter: Payer: Self-pay | Admitting: Family Medicine

## 2019-03-26 VITALS — BP 152/78 | HR 89 | Temp 96.9°F | Resp 18 | Wt 180.0 lb

## 2019-03-26 DIAGNOSIS — E1169 Type 2 diabetes mellitus with other specified complication: Secondary | ICD-10-CM

## 2019-03-26 DIAGNOSIS — E785 Hyperlipidemia, unspecified: Secondary | ICD-10-CM | POA: Diagnosis not present

## 2019-03-26 DIAGNOSIS — I2510 Atherosclerotic heart disease of native coronary artery without angina pectoris: Secondary | ICD-10-CM

## 2019-03-26 DIAGNOSIS — E1143 Type 2 diabetes mellitus with diabetic autonomic (poly)neuropathy: Secondary | ICD-10-CM | POA: Diagnosis not present

## 2019-03-26 NOTE — Progress Notes (Signed)
Name: Victoria Austin   MRN: [id number]    DOB: [birth year]   Date:03/26/2019       Progress Note  Subjective  Chief Complaint  Chief Complaint  Patient presents with  . Diabetes    Needs Diabetic Shoes    HPI  DMII: she was recently seen by [name] back in June and A1C was 7.6%, she is using [employer] and has been checking glucose more often and glucose has been well controlled, and she has been able to track it better, she has been cutting on portion size, drinking a lot of water. She was seen by podiatrist Dr. Prudence Davidson 02/15/2019 and advised to have diabetic shoes. She denies polyphagia, polydipsia or polyuria. She has dyslipidemia but has changed her diet , but still on Lovaza since Vascepa was not available at her pharmacy during her last visit. She gets it from [employer] [facility].    Patient Active Problem List   Diagnosis Date Noted  . 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 Diabetic neuropathy ([place]) 02/15/2019  . Coagulation disorder ([place]) 02/15/2019  . Cerebrovascular accident (CVA) ([place]) 01/08/2019  . Incontinence   . RA (rheumatoid arthritis) ([place])   . Temporal arteritis ([place]) 01/01/2019  . Goals of care, counseling/discussion 12/26/2018  . Albuminuria 12/18/2018  . Type 2 diabetes mellitus with hyperglycemia, with long-term current use of insulin ([place]) 12/11/2018  . Compression fracture of L1 lumbar vertebra ([place]) 12/11/2018  . 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 Anxiety   . Depression   . Bipolar 1 disorder ([place]) 07/29/2004    Past Surgical History:  Procedure Laterality Date  . BACK SURGERY    . CESAREAN SECTION    . ELBOW ARTHROSCOPY    . FINGER SURGERY    . HAND SURGERY    . HERNIA REPAIR    . SPINE SURGERY      Family History  Problem Relation Age of Onset  . Lung cancer Mother   . Hypertension Mother   . Hypertension Sister   . Hypertension Brother   . Bone cancer Maternal Aunt   . Colon cancer Maternal  Uncle     Social History   Socioeconomic History  . Marital status: Legally Separated    Spouse name: Not on file  . Number of children: 2  . Years of education: Not on file  . Highest education level: Not on file  Occupational History  . Occupation: retired  Scientific laboratory technician  . Financial resource strain: Not hard at all  . Food insecurity    Worry: Never true    Inability: Never true  . Transportation needs    Medical: No    Non-medical: No  Tobacco Use  . Smoking status: Never Smoker  . Smokeless tobacco: Never Used  Substance and Sexual Activity  . Alcohol use: Never    Frequency: Never  . Drug use: Never  . Sexual activity: Not Currently    Partners: Male  Lifestyle  . Physical activity    Days per week: 0 days    Minutes per session: 0 min  . Stress: Very much  Relationships  . Social connections    Talks on phone: More than three times a week    Gets together: More than three times a week    Attends religious service: Never    Active member of club or organization: No    Attends meetings of clubs or organizations: Never    Relationship status:  Separated  . Intimate partner violence    Fear of current or ex partner: No    Emotionally abused: No    Physically abused: No    Forced sexual activity: No  Other Topics Concern  . Not on file  Social History Narrative  . Not on file     Current Outpatient Medications:  .  ADVAIR HFA [id number] MCG/ACT inhaler, Inhale 1 puff into the lungs 2 (two) times daily., Disp: 1 Inhaler, Rfl: 3 .  albuterol (PROAIR HFA) 108 (90 Base) MCG/ACT inhaler, Take 1 puff by mouth 2 (two) times daily as needed., Disp: , Rfl:  .  buPROPion (WELLBUTRIN) 100 MG tablet, Take 100 mg by mouth daily., Disp: , Rfl:  .  carbamazepine (TEGRETOL) 200 MG tablet, Take 1 tablet by mouth 3 (three) times daily., Disp: , Rfl:  .  cloNIDine (CATAPRES) 0.1 MG tablet, Take 0.1 mg by mouth daily as needed. , Disp: , Rfl:  .  clopidogrel (PLAVIX) 75 MG  tablet, Take 1 tablet (75 mg total) by mouth daily., Disp: 90 tablet, Rfl: 1 .  Continuous Blood Gluc Receiver (FREESTYLE LIBRE 14 DAY READER) DEVI, 1 Device by Does not apply route 4 (four) times daily., Disp: 1 Device, Rfl: 0 .  Continuous Blood Gluc Sensor (FREESTYLE LIBRE 14 DAY SENSOR) MISC, 1 Device by Does not apply route every 14 (fourteen) days., Disp: 6 each, Rfl: 1 .  docusate sodium (COLACE) 100 MG capsule, Take 100 mg by mouth 2 (two) times daily., Disp: , Rfl:  .  DULoxetine (CYMBALTA) 60 MG capsule, Take 1 capsule by mouth daily., Disp: , Rfl:  .  EPINEPHrine (EPIPEN 2-PAK) 0.3 mg/0.3 mL IJ SOAJ injection, Inject 0.3 mLs (0.3 mg total) into the muscle as needed for anaphylaxis. Then call 911 for ER transport., Disp: 0.3 mL, Rfl: 1 .  gabapentin (NEURONTIN) 300 MG capsule, Take 1 capsule (300 mg total) by mouth daily., Disp: 90 capsule, Rfl: 1 .  hydrALAZINE (APRESOLINE) 50 MG tablet, Take 1 tablet (50 mg total) by mouth 3 (three) times daily., Disp: 270 tablet, Rfl: 1 .  Icosapent Ethyl 1 g CAPS, Take 2 capsules (2 g total) by mouth 2 (two) times daily., Disp: 360 capsule, Rfl: 3 .  Insulin Glargine (LANTUS SOLOSTAR) 100 UNIT/ML Solostar Pen, Inject 80 Units into the skin daily., Disp: 10 pen, Rfl: 1 .  lisinopril (ZESTRIL) 10 MG tablet, , Disp: , Rfl:  .  loratadine (CLARITIN) 10 MG tablet, Take 1 tablet (10 mg total) by mouth daily., Disp: 90 tablet, Rfl: 1 .  LORazepam (ATIVAN) 1 MG tablet, Take 1 tablet by mouth daily., Disp: , Rfl:  .  MAGNESIUM-OXIDE 400 (241.3 Mg) MG tablet, Take 1 tablet (400 mg total) by mouth daily., Disp: 90 tablet, Rfl: 0 .  meclizine (ANTIVERT) 25 MG tablet, Take 1 tablet (25 mg total) by mouth 3 (three) times daily as needed for dizziness., Disp: 180 tablet, Rfl: 1 .  methocarbamol (ROBAXIN) 750 MG tablet, Take 1 tablet (750 mg total) by mouth 2 (two) times daily as needed for muscle spasms., Disp: 180 tablet, Rfl: 0 .  mirabegron ER (MYRBETRIQ) 50 MG  TB24 tablet, Take 1 tablet (50 mg total) by mouth daily., Disp: 90 tablet, Rfl: 2 .  montelukast (SINGULAIR) 10 MG tablet, Take 1 tablet (10 mg total) by mouth daily., Disp: 90 tablet, Rfl: 0 .  NIFEdipine (ADALAT CC) 60 MG 24 hr tablet, Take 1 tablet (60 mg total) by mouth 2 (  two) times daily., Disp: 180 tablet, Rfl: 1 .  NOVOLOG FLEXPEN 100 UNIT/ML FlexPen, Inject 14 Units into the skin 3 (three) times daily. With meals, Disp: 3 pen, Rfl: 3 .  pantoprazole (PROTONIX) 40 MG tablet, Take 1 tablet (40 mg total) by mouth daily., Disp: 90 tablet, Rfl: 1 .  primidone (MYSOLINE) 250 MG tablet, Take 200 mg by mouth at bedtime. , Disp: , Rfl:  .  promethazine (PHENERGAN) 25 MG tablet, Take 1 tablet by mouth every 6 (six) hours as needed. , Disp: , Rfl:  .  propranolol (INDERAL) 10 MG tablet, Take 1 tablet (10 mg total) by mouth 2 (two) times daily., Disp: 180 tablet, Rfl: 0 .  rosuvastatin (CRESTOR) 20 MG tablet, Take 1 tablet (20 mg total) by mouth daily., Disp: 90 tablet, Rfl: 3 .  SURE COMFORT PEN NEEDLES 31G X 5 MM MISC, Inject 1 pen as directed 5 (five) times daily as needed., Disp: 100 each, Rfl: 3 .  traZODone (DESYREL) 50 MG tablet, Take 1 tablet by mouth at bedtime., Disp: , Rfl:  .  trimethoprim (TRIMPEX) 100 MG tablet, Take 1 tablet (100 mg total) by mouth daily., Disp: 90 tablet, Rfl: 1 .  Vitamin D, Ergocalciferol, (DRISDOL) 1.25 MG (50000 UT) CAPS capsule, Take 1 capsule (50,000 Units total) by mouth once a week., Disp: 12 capsule, Rfl: 1  Allergies  Allergen Reactions  . Ciprofloxacin Nausea And Vomiting  . Cyclobenzaprine Nausea And Vomiting  . Diazepam Nausea And Vomiting  . Ketorolac     Other reaction(s): Unknown  . Mushroom Extract Complex   . Neomycin-Polymyxin-Gramicidin   . Ranitidine Hcl   . Ondansetron Nausea And Vomiting    I personally reviewed active problem list, medication list, allergies, family history, social history with the patient/caregiver  today.   ROS  Ten systems reviewed and is negative except as mentioned in HPI   Objective  Vitals:   03/26/19 1535  BP: (!) 152/78  Pulse: 89  Resp: 18  Temp: (!) 96.9 F (36.1 C)  TempSrc: Temporal  SpO2: 98%  Weight: 180 lb (81.6 kg)    Body mass index is 30.9 kg/m.  Physical Exam  Constitutional: Patient appears well-developed and well-nourished. Obese No distress.  HEENT: head atraumatic, normocephalic, pupils equal and reactive to light,  Cardiovascular: Normal rate, regular rhythm and normal heart sounds.  No murmur heard. No BLE edema. Pulmonary/Chest: Effort normal and breath sounds normal. No respiratory distress. Muscular skeletal: wearing a back brace.  Abdominal: Soft.  There is no tenderness. Psychiatric: Patient has a normal mood and affect. behavior is normal. Judgment and thought content normal.  Recent Results (from the past 2160 hour(s))  Sedimentation rate     Status: Abnormal   Collection Time: 12/28/18  5:02 PM  Result Value Ref Range   Sed Rate 41 (H) 0 - 30 mm/hr    Comment: Performed at [facility], [address] [place], [place] [address]  C-reactive protein     Status: None   Collection Time: 12/28/18  5:02 PM  Result Value Ref Range   CRP <0.8 <1.0 mg/dL    Comment: Performed at [address], [place], [place] [address]  Urine Culture     Status: None   Collection Time: 01/08/19  2:27 PM   Specimen: Urine  Result Value Ref Range   MICRO NUMBER: [id number]    SPECIMEN QUALITY: Adequate    Sample Source URINE    STATUS:  FINAL    Result:      Three or more organisms present, each greater than 10,000 CFU/mL. May represent normal flora contamination from external genitalia. No further testing is required.  Urine Microalbumin w/creat. ratio     Status: None   Collection Time: 01/08/19  2:27 PM  Result Value Ref Range   Creatinine, Urine 159 20 - 275 mg/dL   Microalb, Ur 0.5 mg/dL    Comment: Reference  Range Not established    Microalb Creat Ratio 3 <30 mcg/mg creat    Comment: . The ADA defines abnormalities in albumin excretion as follows: Marland Kitchen Category         Result (mcg/mg creatinine) . Normal                    <30 Microalbuminuria         30-299  Clinical albuminuria   > OR = 300 . The ADA recommends that at least two of three specimens collected within a 3-6 month period be abnormal before considering a patient to be within a diagnostic category.   Urinalysis, Complete     Status: Abnormal   Collection Time: 01/28/19  1:55 PM  Result Value Ref Range   Specific Gravity, UA 1.010 1.005 - 1.030   pH, UA 6.5 5.0 - 7.5   Color, UA Yellow Yellow   Appearance Ur Clear Clear   Leukocytes,UA Negative Negative   Protein,UA 2+ (A) Negative/Trace   Glucose, UA 2+ (A) Negative   Ketones, UA Negative Negative   RBC, UA Negative Negative   Bilirubin, UA Negative Negative   Urobilinogen, Ur 0.2 0.2 - 1.0 mg/dL   Nitrite, UA Negative Negative   Microscopic Examination See below:   Microscopic Examination     Status: None   Collection Time: 01/28/19  1:55 PM   URINE  Result Value Ref Range   WBC, UA 0-5 0 - 5 /hpf   RBC None seen 0 - 2 /hpf   Epithelial Cells (non renal) 0-10 0 - 10 /hpf   Bacteria, UA None seen None seen/Few  BLADDER SCAN AMB NON-IMAGING     Status: None   Collection Time: 01/28/19  2:01 PM  Result Value Ref Range   Scan Result 57ml   COMPLETE METABOLIC PANEL WITH GFR     Status: Abnormal   Collection Time: 03/01/19 11:40 AM  Result Value Ref Range   Glucose, Bld 294 (H) 65 - 99 mg/dL    Comment: .            Fasting reference interval . For someone without known diabetes, a glucose value >125 mg/dL indicates that they may have diabetes and this should be confirmed with a follow-up test. .    BUN 14 7 - 25 mg/dL   Creat 1.06 (H) 0.50 - 0.99 mg/dL    Comment: For patients >63 years of age, the reference limit for Creatinine is approximately 13%  higher for people identified as African-American. .    GFR, Est Non African American 55 (L) > OR = 60 mL/min/1.59m2   GFR, Est African American 63 > OR = 60 mL/min/1.86m2   BUN/Creatinine Ratio 13 6 - 22 (calc)   Sodium 134 (L) 135 - 146 mmol/L   Potassium 4.5 3.5 - 5.3 mmol/L   Chloride 95 (L) 98 - 110 mmol/L   CO2 24 20 - 32 mmol/L   Calcium 9.6 8.6 - 10.4 mg/dL   Total Protein 7.5 6.1 - 8.1  g/dL   Albumin 4.4 3.6 - 5.1 g/dL   Globulin 3.1 1.9 - 3.7 g/dL (calc)   AG Ratio 1.4 1.0 - 2.5 (calc)   Total Bilirubin 0.3 0.2 - 1.2 mg/dL   Alkaline phosphatase (APISO) 131 37 - 153 U/L   AST 15 10 - 35 U/L   ALT 8 6 - 29 U/L  CBC with Differential/Platelet     Status: None   Collection Time: 03/01/19 11:40 AM  Result Value Ref Range   WBC 6.6 3.8 - 10.8 Thousand/uL   RBC 4.52 3.80 - 5.10 Million/uL   Hemoglobin 12.9 11.7 - 15.5 g/dL   HCT 38.5 35.0 - 45.0 %   MCV 85.2 80.0 - 100.0 fL   MCH 28.5 27.0 - 33.0 pg   MCHC 33.5 32.0 - 36.0 g/dL   RDW 13.7 11.0 - 15.0 %   Platelets 274 140 - 400 Thousand/uL   MPV 9.4 7.5 - 12.5 fL   Neutro Abs 3,722 1,500 - 7,800 cells/uL   Lymphs Abs 2,231 850 - 3,900 cells/uL   Absolute Monocytes 475 200 - 950 cells/uL   Eosinophils Absolute 119 15 - 500 cells/uL   Basophils Absolute 53 0 - 200 cells/uL   Neutrophils Relative % 56.4 %   Total Lymphocyte 33.8 %   Monocytes Relative 7.2 %   Eosinophils Relative 1.8 %   Basophils Relative 0.8 %  TSH     Status: None   Collection Time: 03/01/19 11:40 AM  Result Value Ref Range   TSH 1.68 0.40 - 4.50 mIU/L  B12 and Folate Panel     Status: None   Collection Time: 03/01/19 11:40 AM  Result Value Ref Range   Vitamin B-12 342 200 - 1,100 pg/mL    Comment: . Please Note: Although the reference range for vitamin B12 is [phone] pg/mL, it has been reported that between 5 and 10% of patients with values between 200 and 400 pg/mL may experience neuropsychiatric and hematologic abnormalities due to  occult B12 deficiency; less than 1% of patients with values above 400 pg/mL will have symptoms. .    Folate 12.2 ng/mL    Comment:                            Reference Range                            Low:           <3.4                            Borderline:    3.4-5.4                            Normal:        >5.4 .   Magnesium     Status: None   Collection Time: 03/01/19 11:40 AM  Result Value Ref Range   Magnesium 1.6 1.5 - 2.5 mg/dL  Lipid panel     Status: Abnormal   Collection Time: 03/01/19 11:40 AM  Result Value Ref Range   Cholesterol 186 <200 mg/dL   HDL 39 (L) > OR = 50 mg/dL   Triglycerides 413 (H) <150 mg/dL    Comment: . If a non-fasting specimen was collected, consider repeat triglyceride testing on a fasting specimen  if clinically indicated.  Yates Decamp et al. J. of Clin. Lipidol. [id number]. Marland Kitchen    LDL Cholesterol (Calc)  mg/dL (calc)    Comment: . LDL cholesterol not calculated. Triglyceride levels greater than 400 mg/dL invalidate calculated LDL results. . Reference range: <100 . Desirable range <100 mg/dL for primary prevention;   <70 mg/dL for patients with CHD or diabetic patients  with > or = 2 CHD risk factors. Marland Kitchen LDL-C is now calculated using the Martin-Hopkins  calculation, which is a validated novel method providing  better accuracy than the Friedewald equation in the  estimation of LDL-C.  Cresenciano Genre et al. Annamaria Helling. [id number]): 2061-2068  (http://education.[facility].com/faq/FAQ164)    Total CHOL/HDL Ratio 4.8 <5.0 (calc)   Non-HDL Cholesterol (Calc) 147 (H) <130 mg/dL (calc)    Comment: For patients with diabetes plus 1 major ASCVD risk  factor, treating to a non-HDL-C goal of <100 mg/dL  (LDL-C of <70 mg/dL) is considered a therapeutic  option.     Diabetic Foot Exam: Diabetic Foot Exam - Simple   Simple Foot Form Diabetic Foot exam was performed with the following findings: Yes 03/26/2019  4:08 PM  Visual Inspection See  comments: Yes Sensation Testing See comments: Yes Pulse Check Posterior Tibialis and Dorsalis pulse intact bilaterally: Yes Comments Hammer toe 2nd right toe, no toe nails on both first toes        PHQ2/9: Depression screen [facility] 2/9 03/26/2019 03/01/2019 01/08/2019 01/01/2019 12/22/2018  Decreased Interest 1 3 3  0 0  Down, Depressed, Hopeless 1 3 3  0 0  PHQ - 2 Score 2 6 6  0 0  Altered sleeping 0 2 3 1  -  Tired, decreased energy 1 2 3  0 -  Change in appetite 2 0 3 1 -  Feeling bad or failure about yourself  1 1 3 1  -  Trouble concentrating 3 0 3 0 -  Moving slowly or fidgety/restless 2 0 3 0 -  Suicidal thoughts 1 0 3 0 -  PHQ-9 Score 12 11 27 3  -  Difficult doing work/chores Somewhat difficult Very difficult Very difficult Somewhat difficult -    phq 9 is positive   Fall Risk: Fall Risk  03/26/2019 03/01/2019 01/08/2019 12/18/2018 12/11/2018  Falls in the past year? 1 1 1 1 1   Comment April 2020 - - - -  Number falls in past yr: 0 0 1 1 1   Injury with Fall? 1 0 1 1 1   Comment Contusion in the back of her head - - - -  Risk for fall due to : - - Impaired balance/gait;History of fall(s);Impaired mobility - History of fall(s)  Follow up - - - - Falls evaluation completed     Functional Status Survey: Is the patient deaf or have difficulty hearing?: No Does the patient have difficulty seeing, even when wearing glasses/contacts?: Yes Does the patient have difficulty concentrating, remembering, or making decisions?: No Does the patient have difficulty walking or climbing stairs?: No Does the patient have difficulty dressing or bathing?: No Does the patient have difficulty doing errands alone such as visiting a doctor's office or shopping?: No    Assessment & Plan  1. Dyslipidemia associated with type 2 diabetes mellitus ([place])  Continue medication and dietary modification  2. 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

## 2019-03-29 ENCOUNTER — Encounter: Payer: Self-pay | Admitting: Family Medicine

## 2019-03-30 DIAGNOSIS — F411 Generalized anxiety disorder: Secondary | ICD-10-CM | POA: Diagnosis not present

## 2019-03-30 DIAGNOSIS — F3132 Bipolar disorder, current episode depressed, moderate: Secondary | ICD-10-CM | POA: Diagnosis not present

## 2019-04-08 DIAGNOSIS — M4807 Spinal stenosis, lumbosacral region: Secondary | ICD-10-CM | POA: Diagnosis not present

## 2019-04-08 DIAGNOSIS — M4856XA Collapsed vertebra, not elsewhere classified, lumbar region, initial encounter for fracture: Secondary | ICD-10-CM | POA: Diagnosis not present

## 2019-04-08 DIAGNOSIS — M47816 Spondylosis without myelopathy or radiculopathy, lumbar region: Secondary | ICD-10-CM | POA: Diagnosis not present

## 2019-04-08 DIAGNOSIS — G8929 Other chronic pain: Secondary | ICD-10-CM | POA: Diagnosis not present

## 2019-04-08 DIAGNOSIS — M545 Low back pain: Secondary | ICD-10-CM | POA: Diagnosis not present

## 2019-04-13 ENCOUNTER — Telehealth: Payer: Self-pay | Admitting: Family Medicine

## 2019-04-13 ENCOUNTER — Ambulatory Visit (INDEPENDENT_AMBULATORY_CARE_PROVIDER_SITE_OTHER): Payer: Medicare Other | Admitting: Family Medicine

## 2019-04-13 ENCOUNTER — Encounter: Payer: Self-pay | Admitting: Family Medicine

## 2019-04-13 ENCOUNTER — Other Ambulatory Visit: Payer: Self-pay

## 2019-04-13 ENCOUNTER — Ambulatory Visit: Payer: Medicare Other | Admitting: Licensed Clinical Social Worker

## 2019-04-13 VITALS — Wt 181.0 lb

## 2019-04-13 DIAGNOSIS — R809 Proteinuria, unspecified: Secondary | ICD-10-CM | POA: Diagnosis not present

## 2019-04-13 DIAGNOSIS — C8110 Nodular sclerosis classical Hodgkin lymphoma, unspecified site: Secondary | ICD-10-CM

## 2019-04-13 DIAGNOSIS — F3132 Bipolar disorder, current episode depressed, moderate: Secondary | ICD-10-CM | POA: Diagnosis not present

## 2019-04-13 DIAGNOSIS — E1169 Type 2 diabetes mellitus with other specified complication: Secondary | ICD-10-CM | POA: Diagnosis not present

## 2019-04-13 DIAGNOSIS — I639 Cerebral infarction, unspecified: Secondary | ICD-10-CM

## 2019-04-13 DIAGNOSIS — S32010D Wedge compression fracture of first lumbar vertebra, subsequent encounter for fracture with routine healing: Secondary | ICD-10-CM | POA: Diagnosis not present

## 2019-04-13 DIAGNOSIS — R42 Dizziness and giddiness: Secondary | ICD-10-CM | POA: Diagnosis not present

## 2019-04-13 DIAGNOSIS — E785 Hyperlipidemia, unspecified: Secondary | ICD-10-CM

## 2019-04-13 DIAGNOSIS — K5900 Constipation, unspecified: Secondary | ICD-10-CM | POA: Insufficient documentation

## 2019-04-13 DIAGNOSIS — R296 Repeated falls: Secondary | ICD-10-CM | POA: Diagnosis not present

## 2019-04-13 DIAGNOSIS — E781 Pure hyperglyceridemia: Secondary | ICD-10-CM | POA: Diagnosis not present

## 2019-04-13 DIAGNOSIS — E1165 Type 2 diabetes mellitus with hyperglycemia: Secondary | ICD-10-CM

## 2019-04-13 DIAGNOSIS — I10 Essential (primary) hypertension: Secondary | ICD-10-CM

## 2019-04-13 DIAGNOSIS — R32 Unspecified urinary incontinence: Secondary | ICD-10-CM

## 2019-04-13 DIAGNOSIS — E538 Deficiency of other specified B group vitamins: Secondary | ICD-10-CM | POA: Diagnosis not present

## 2019-04-13 DIAGNOSIS — M316 Other giant cell arteritis: Secondary | ICD-10-CM

## 2019-04-13 DIAGNOSIS — Z794 Long term (current) use of insulin: Secondary | ICD-10-CM

## 2019-04-13 DIAGNOSIS — F411 Generalized anxiety disorder: Secondary | ICD-10-CM | POA: Diagnosis not present

## 2019-04-13 DIAGNOSIS — N390 Urinary tract infection, site not specified: Secondary | ICD-10-CM

## 2019-04-13 DIAGNOSIS — F319 Bipolar disorder, unspecified: Secondary | ICD-10-CM

## 2019-04-13 DIAGNOSIS — F419 Anxiety disorder, unspecified: Secondary | ICD-10-CM

## 2019-04-13 DIAGNOSIS — M069 Rheumatoid arthritis, unspecified: Secondary | ICD-10-CM

## 2019-04-13 MED ORDER — METHOCARBAMOL 750 MG PO TABS: 750.0000 mg | ORAL_TABLET | Freq: Two times a day (BID) | ORAL | 1 refills | Status: DC | PRN

## 2019-04-13 MED ORDER — GABAPENTIN 300 MG PO CAPS: 600.0000 mg | ORAL_CAPSULE | Freq: Every day | ORAL | 1 refills | Status: DC

## 2019-04-13 NOTE — Telephone Encounter (Signed)
Pt is going to stop by tomorrow to get the b-12 injection. 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

## 2019-04-13 NOTE — Progress Notes (Signed)
Name: Victoria Austin   MRN: [id number]    DOB: 14-Jan-1951   Date:04/13/2019       Progress Note  Subjective  Chief Complaint  Chief Complaint  Patient presents with  . Follow-up    3 month up with labs    I connected with  Victoria Austin on 04/13/19 at 11:00 AM EDT by telephone and verified that I am speaking with the correct person using two identifiers.  I discussed the limitations, risks, security and privacy concerns of performing an evaluation and management service by telephone and the availability of in person appointments. Staff also discussed with the patient that there may be a patient responsible charge related to this service. Patient Location: Home Provider Location: Office Additional Individuals present: None  HPI  HTN: She notes home BP today was 195/82, but she is in a lot of pain and under a lot of stress helping her grandchildren with virtual learning.  She has upcoming appointment with Dr. [name] (nephrology) .  She denies chest pain, shortness of breath (except with exertion), palpitations Current medications: Lisinopril 10mg  Clonidine 0.1mg  once daily PRN. Nifedipine 60mg  BID Hydralazine 50mg  TID Propanolol 10mg  BID  B12 Deficiency: Had been receiving injections once monthly after last check.  We will maintain this at this time.  Constipation and IBS: Used to see GI, but did not want to see them when she moved here.  She is taking stool softener PRN.  Unchanged and stable.  Frequent Falls/Back Pain: Home health was ordered, had PT/home health come out, had 3 weeks of PT and then   She fell in March 2020 and was diagnosed with compression fracture of the spine. She had a CT scan done on Friday with [facility] - showed L1 compression fracture had progressed and ongoing other degenerative changes.  Again on 12/29/2018 and had hematoma to the posterior head (essentially resolved) and LEFT hand injury with possible ligament tear (needs referral to ortho). She  has been seeing spine specialty with [facility] - last visit was 12/04/2018.  She is taking muscle relaxer and gabapentin without side effects, but is still having cramping in her thighs; ongoing bilateral hip pain.Will recommend she continue follow up with spine specialty, referral to hands and home health.  She is in an incredible amount of pain and would like a second opinion at this time - we will refer to pain management to look at things from this perspective for her, and increase gabapentin to 600mg  QHS.  Vertigo: Falling frequently due to vertigo.  She has poor balance; was referred to home health for eval for PT if needed. She does take meclizine 2 tablets QAM an 1 tablet in the afternoon with only minimal help. She describes the dizziness as room spinning dizziness. Referral to ENT for vertigo clinic is placed, but she wants to wait for now.  .   T2DM- FBS are varied - 79-140's, she She used to see an endocrinologist prior to moving from [place] recently. Currently taking Lantus 80 units.Novolog sliding scale - if BG 150-200 - 12 units, each additional 50 BG's she will add 2 units. She used to take Metformin but was taken off due to diarrhea. She has a first cousin with Medullary thyroid cancer history, so she is not a candidate for GLP-1 therapy.  She was noted to have retinopathy at her recent eye examination.  Denies polydipsia, polyuria, or polyphagia.  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  HLD: Denies chest pain or myalgias.  Taking cresor and vascepa.  Recurrent UTI: Seeing urology, taking daily suppressive trimethoprim now and has been doing well.  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  History of Temporal Arteritis: Keeping follow up with ophthalmologist who  is managing at this time.   Not taking prednisone, no recent medication changes.  Declines referral to rheumatology at this time.  RA/FMS: She has history of RA and is not on any medication aside from ocasional muscle relaxer.  She notes LEFT foot does occasionally have pain. Taking cymbalta, gabapentin.  She declines referral to rheumatology.  Bipolar 1 & Depression: She was referred to psychiatry, but has not heard back yet.  No SI/HI in office, though she does admit to having passive thoughts intermittently throughout the day (no plan), no hallucinations.  She has been keeping up virtually with her [place] until referral locally goes through. Taking Tegretol, Wellbutrin, Cymbalta, Ativan, Trazodone.  History of Stroke: Taking Plavix daily; on statin therapy and vascepa.  Not taking ASA any longer.  Had stroke 2019.  No residual effects aside from some memory impairment on occasion.  Has had 2 TIA's prior to the stroke.   Patient Active Problem List   Diagnosis Date Noted  . 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 Diabetic neuropathy ([place]) 02/15/2019  . Coagulation disorder ([place]) 02/15/2019  . Cerebrovascular accident (CVA) ([place]) 01/08/2019  . Incontinence   . RA (rheumatoid arthritis) ([place])   . Temporal arteritis ([place]) 01/01/2019  . Goals of care, counseling/discussion 12/26/2018  . Albuminuria 12/18/2018  . Type 2 diabetes mellitus with hyperglycemia, with long-term current use of insulin ([place]) 12/11/2018  . Compression fracture of L1 lumbar vertebra ([place]) 12/11/2018  . 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 Nodular sclerosing Hodgkin's lymphoma ([place]) 12/11/2018  . Anxiety   . Depression   . Bipolar 1 disorder ([place]) 07/29/2004    Past Surgical History:  Procedure Laterality Date  . BACK SURGERY    . CESAREAN SECTION    . ELBOW ARTHROSCOPY    . FINGER SURGERY    . HAND SURGERY    . HERNIA REPAIR    . SPINE SURGERY      Family History  Problem Relation Age of Onset  . Lung  cancer Mother   . Hypertension Mother   . Hypertension Sister   . Hypertension Brother   . Bone cancer Maternal Aunt   . Colon cancer Maternal Uncle     Social History   Socioeconomic History  . Marital status: Legally Separated    Spouse name: Not on file  . Number of children: 2  . Years of education: Not on file  . Highest education level: Not on file  Occupational History  . Occupation: retired  Scientific laboratory technician  . Financial resource strain: Not hard at all  . Food insecurity    Worry: Never true    Inability: Never true  . Transportation needs    Medical: No    Non-medical: No  Tobacco Use  . Smoking status: Never Smoker  . Smokeless tobacco: Never Used  Substance and Sexual Activity  . Alcohol use: Never    Frequency: Never  . Drug use: Never  . Sexual activity: Not Currently    Partners: Male  Lifestyle  . Physical  activity    Days per week: 0 days    Minutes per session: 0 min  . Stress: Very much  Relationships  . Social connections    Talks on phone: More than three times a week    Gets together: More than three times a week    Attends religious service: Never    Active member of club or organization: No    Attends meetings of clubs or organizations: Never    Relationship status: Separated  . Intimate partner violence    Fear of current or ex partner: No    Emotionally abused: No    Physically abused: No    Forced sexual activity: No  Other Topics Concern  . Not on file  Social History Narrative  . Not on file     Current Outpatient Medications:  .  ADVAIR HFA [id number] MCG/ACT inhaler, Inhale 1 puff into the lungs 2 (two) times daily., Disp: 1 Inhaler, Rfl: 3 .  albuterol (PROAIR HFA) 108 (90 Base) MCG/ACT inhaler, Take 1 puff by mouth 2 (two) times daily as needed., Disp: , Rfl:  .  buPROPion (WELLBUTRIN) 100 MG tablet, Take 100 mg by mouth daily., Disp: , Rfl:  .  carbamazepine (TEGRETOL) 200 MG tablet, Take 1 tablet by mouth 3 (three) times  daily., Disp: , Rfl:  .  cloNIDine (CATAPRES) 0.1 MG tablet, Take 0.1 mg by mouth daily as needed. , Disp: , Rfl:  .  clopidogrel (PLAVIX) 75 MG tablet, Take 1 tablet (75 mg total) by mouth daily., Disp: 90 tablet, Rfl: 1 .  Continuous Blood Gluc Receiver (FREESTYLE LIBRE 14 DAY READER) DEVI, 1 Device by Does not apply route 4 (four) times daily., Disp: 1 Device, Rfl: 0 .  Continuous Blood Gluc Sensor (FREESTYLE LIBRE 14 DAY SENSOR) MISC, 1 Device by Does not apply route every 14 (fourteen) days., Disp: 6 each, Rfl: 1 .  docusate sodium (COLACE) 100 MG capsule, Take 100 mg by mouth 2 (two) times daily., Disp: , Rfl:  .  DULoxetine (CYMBALTA) 60 MG capsule, Take 1 capsule by mouth daily., Disp: , Rfl:  .  EPINEPHrine (EPIPEN 2-PAK) 0.3 mg/0.3 mL IJ SOAJ injection, Inject 0.3 mLs (0.3 mg total) into the muscle as needed for anaphylaxis. Then call 911 for ER transport., Disp: 0.3 mL, Rfl: 1 .  gabapentin (NEURONTIN) 300 MG capsule, Take 1 capsule (300 mg total) by mouth daily., Disp: 90 capsule, Rfl: 1 .  hydrALAZINE (APRESOLINE) 50 MG tablet, Take 1 tablet (50 mg total) by mouth 3 (three) times daily., Disp: 270 tablet, Rfl: 1 .  Icosapent Ethyl 1 g CAPS, Take 2 capsules (2 g total) by mouth 2 (two) times daily., Disp: 360 capsule, Rfl: 3 .  Insulin Glargine (LANTUS SOLOSTAR) 100 UNIT/ML Solostar Pen, Inject 80 Units into the skin daily., Disp: 10 pen, Rfl: 1 .  lisinopril (ZESTRIL) 10 MG tablet, , Disp: , Rfl:  .  loratadine (CLARITIN) 10 MG tablet, Take 1 tablet (10 mg total) by mouth daily., Disp: 90 tablet, Rfl: 1 .  LORazepam (ATIVAN) 1 MG tablet, Take 1 tablet by mouth daily., Disp: , Rfl:  .  MAGNESIUM-OXIDE 400 (241.3 Mg) MG tablet, Take 1 tablet (400 mg total) by mouth daily., Disp: 90 tablet, Rfl: 0 .  meclizine (ANTIVERT) 25 MG tablet, Take 1 tablet (25 mg total) by mouth 3 (three) times daily as needed for dizziness., Disp: 180 tablet, Rfl: 1 .  methocarbamol (ROBAXIN) 750 MG tablet, Take  1  tablet (750 mg total) by mouth 2 (two) times daily as needed for muscle spasms., Disp: 180 tablet, Rfl: 0 .  mirabegron ER (MYRBETRIQ) 50 MG TB24 tablet, Take 1 tablet (50 mg total) by mouth daily., Disp: 90 tablet, Rfl: 2 .  montelukast (SINGULAIR) 10 MG tablet, Take 1 tablet (10 mg total) by mouth daily., Disp: 90 tablet, Rfl: 0 .  NIFEdipine (ADALAT CC) 60 MG 24 hr tablet, Take 1 tablet (60 mg total) by mouth 2 (two) times daily., Disp: 180 tablet, Rfl: 1 .  NOVOLOG FLEXPEN 100 UNIT/ML FlexPen, Inject 14 Units into the skin 3 (three) times daily. With meals, Disp: 3 pen, Rfl: 3 .  pantoprazole (PROTONIX) 40 MG tablet, Take 1 tablet (40 mg total) by mouth daily., Disp: 90 tablet, Rfl: 1 .  primidone (MYSOLINE) 250 MG tablet, Take 200 mg by mouth at bedtime. , Disp: , Rfl:  .  promethazine (PHENERGAN) 25 MG tablet, Take 1 tablet by mouth every 6 (six) hours as needed. , Disp: , Rfl:  .  propranolol (INDERAL) 10 MG tablet, Take 1 tablet (10 mg total) by mouth 2 (two) times daily., Disp: 180 tablet, Rfl: 0 .  rosuvastatin (CRESTOR) 20 MG tablet, Take 1 tablet (20 mg total) by mouth daily., Disp: 90 tablet, Rfl: 3 .  SURE COMFORT PEN NEEDLES 31G X 5 MM MISC, Inject 1 pen as directed 5 (five) times daily as needed., Disp: 100 each, Rfl: 3 .  traZODone (DESYREL) 50 MG tablet, Take 1 tablet by mouth at bedtime., Disp: , Rfl:  .  trimethoprim (TRIMPEX) 100 MG tablet, Take 1 tablet (100 mg total) by mouth daily., Disp: 90 tablet, Rfl: 1 .  Vitamin D, Ergocalciferol, (DRISDOL) 1.25 MG (50000 UT) CAPS capsule, Take 1 capsule (50,000 Units total) by mouth once a week., Disp: 12 capsule, Rfl: 1  Allergies  Allergen Reactions  . Ciprofloxacin Nausea And Vomiting  . Cyclobenzaprine Nausea And Vomiting  . Diazepam Nausea And Vomiting  . Ketorolac     Other reaction(s): Unknown  . Mushroom Extract Complex   . Neomycin-Polymyxin-Gramicidin   . Ranitidine Hcl   . Ondansetron Nausea And Vomiting    I  personally reviewed active problem list, medication list, allergies, health maintenance, notes from last encounter, lab results with the patient/caregiver today.   ROS Ten systems reviewed and is negative except as mentioned in HPI  Objective  Virtual encounter, vitals not obtained.  Body mass index is 31.07 kg/m.  Physical Exam  Pulmonary/Chest: Effort normal. No respiratory distress. Speaking in complete sentences Neurological: Pt is alert and oriented to person, place, and time. Speech is normal Psychiatric: Patient has a normal mood and affect. behavior is normal. Judgment and thought content normal.  No results found for this or any previous visit (from the past 72 hour(s)).  PHQ2/9: Depression screen [facility] 2/9 04/13/2019 04/13/2019 03/26/2019 03/01/2019 01/08/2019  Decreased Interest 0 0 1 3 3   Down, Depressed, Hopeless 0 0 1 3 3   PHQ - 2 Score 0 0 2 6 6   Altered sleeping 0 0 0 2 3  Tired, decreased energy 0 0 1 2 3   Change in appetite 0 0 2 0 3  Feeling bad or failure about yourself  0 0 1 1 3   Trouble concentrating 1 0 3 0 3  Moving slowly or fidgety/restless 0 0 2 0 3  Suicidal thoughts 0 0 1 0 3  PHQ-9 Score 1 0 12 11 27   Difficult doing work/chores Somewhat  difficult - Somewhat difficult Very difficult Very difficult   PHQ-2/9 Result is negative.    Fall Risk: Fall Risk  04/13/2019 03/26/2019 03/01/2019 01/08/2019 12/18/2018  Falls in the past year? 1 1 1 1 1   Comment - April 2020 - - -  Number falls in past yr: 1 0 0 1 1  Injury with Fall? 1 1 0 1 1  Comment - Contusion in the back of her head - - -  Risk for fall due to : - - - Impaired balance/gait;History of fall(s);Impaired mobility -  Follow up - - - - -     Assessment & Plan  1. Essential hypertension - Pt will call Dr. [name] regarding elevated BP's and medication adjustments/labs if needed.  - COMPLETE METABOLIC PANEL WITH GFR  2. B12 deficiency - Will come in for monthly B12 injections  3.  Constipation, unspecified constipation type - She is doing well with this now, no issues  4. Compression fracture of L1 vertebra with routine healing, subsequent encounter - Needs to follow up with spine; she is struggling with pain management and will refer to Dr. Holley Raring for assistance; increase gabapentin to 600mg  QHS - methocarbamol (ROBAXIN) 750 MG tablet; Take 1 tablet (750 mg total) by mouth 2 (two) times daily as needed for muscle spasms.  Dispense: 180 tablet; Refill: 1 - gabapentin (NEURONTIN) 300 MG capsule; Take 2 capsules (600 mg total) by mouth at bedtime.  Dispense: 180 capsule; Refill: 1 - Ambulatory referral to Pain Clinic  5. Frequent falls - No falls since last visit; doing exercises from PT  6. Vertigo - Doing well, but still taking meclizine daily and refuses ENT at this time  7. Type 2 diabetes mellitus with hyperglycemia, with long-term current use of insulin (HCC) - Continue medications; labs due toda - Hemoglobin A1c - COMPLETE METABOLIC PANEL WITH GFR  8. Dyslipidemia associated with type 2 diabetes mellitus ([place]) - Continue medications  9. Albuminuria - Seeing Nephrology  10. Hypertriglyceridemia - Continue medications  11. Recurrent UTI - Seeing urology; taking suppressive therapy  12. Urinary incontinence, unspecified type - Seeing urology  13. Nodular sclerosing Hodgkin's lymphoma, unspecified body region [facility]) - Seeing oncology for follow up  14. Temporal arteritis ([place]) - Seeing opthalmology   15. Rheumatoid arthritis, involving unspecified site, unspecified rheumatoid factor presence ([place]) - Does not want to see rheumatology; not on u=immune modulator  16. Bipolar 1 disorder ([place]) - Seeing psychiatry and counseling  17. Anxiety - Psychiatry and counseling  18. Cerebrovascular accident (CVA), unspecified mechanism ([place]) - No residual effects; taking statin therapy  I discussed the assessment and treatment plan with the patient. The  patient was provided an opportunity to ask questions and all were answered. The patient agreed with the plan and demonstrated an understanding of the instructions.   The patient was advised to call back or seek an in-person evaluation if the symptoms worsen or if the condition fails to improve as anticipated.  I provided 34 minutes of non-face-to-face time during this encounter.  Hubbard Hartshorn, FNP

## 2019-04-13 NOTE — Telephone Encounter (Signed)
Please call to schedule B12 injection for September; may do monthly shots.

## 2019-04-14 ENCOUNTER — Ambulatory Visit: Payer: Medicare Other | Admitting: Orthotics

## 2019-04-14 ENCOUNTER — Ambulatory Visit (INDEPENDENT_AMBULATORY_CARE_PROVIDER_SITE_OTHER): Payer: Medicare Other

## 2019-04-14 DIAGNOSIS — L603 Nail dystrophy: Secondary | ICD-10-CM

## 2019-04-14 DIAGNOSIS — E538 Deficiency of other specified B group vitamins: Secondary | ICD-10-CM

## 2019-04-14 DIAGNOSIS — E1165 Type 2 diabetes mellitus with hyperglycemia: Secondary | ICD-10-CM | POA: Diagnosis not present

## 2019-04-14 DIAGNOSIS — Z23 Encounter for immunization: Secondary | ICD-10-CM

## 2019-04-14 DIAGNOSIS — Z794 Long term (current) use of insulin: Secondary | ICD-10-CM | POA: Diagnosis not present

## 2019-04-14 DIAGNOSIS — E1142 Type 2 diabetes mellitus with diabetic polyneuropathy: Secondary | ICD-10-CM

## 2019-04-14 DIAGNOSIS — I10 Essential (primary) hypertension: Secondary | ICD-10-CM | POA: Diagnosis not present

## 2019-04-14 MED ORDER — CYANOCOBALAMIN 1000 MCG/ML IJ SOLN
1000.0000 ug | Freq: Once | INTRAMUSCULAR | Status: AC
  Administered 2019-04-14: 1000 ug via INTRAMUSCULAR

## 2019-04-14 NOTE — Progress Notes (Signed)
Didn't cast her for shoes.  She is leaving Oct 5th to be with daughter in [place] for 6 months.  Told her Medicare rules would not allow for me to mail them to her.  I told her it was best to find a provider once she got settled in [place]

## 2019-04-15 ENCOUNTER — Encounter: Payer: Self-pay | Admitting: Family Medicine

## 2019-04-15 DIAGNOSIS — Z794 Long term (current) use of insulin: Secondary | ICD-10-CM

## 2019-04-15 DIAGNOSIS — I10 Essential (primary) hypertension: Secondary | ICD-10-CM | POA: Insufficient documentation

## 2019-04-15 DIAGNOSIS — E1165 Type 2 diabetes mellitus with hyperglycemia: Secondary | ICD-10-CM

## 2019-04-15 LAB — COMPLETE METABOLIC PANEL WITH GFR
AG Ratio: 1.4 (calc) (ref 1.0–2.5)
ALT: 7 U/L (ref 6–29)
AST: 16 U/L (ref 10–35)
Albumin: 4.2 g/dL (ref 3.6–5.1)
Alkaline phosphatase (APISO): 111 U/L (ref 37–153)
BUN/Creatinine Ratio: 18 (calc) (ref 6–22)
BUN: 20 mg/dL (ref 7–25)
CO2: 26 mmol/L (ref 20–32)
Calcium: 9.2 mg/dL (ref 8.6–10.4)
Chloride: 98 mmol/L (ref 98–110)
Creat: 1.12 mg/dL — ABNORMAL HIGH (ref 0.50–0.99)
GFR, Est African American: 59 mL/min/{1.73_m2} — ABNORMAL LOW (ref >=60)
GFR, Est Non African American: 51 mL/min/{1.73_m2} — ABNORMAL LOW (ref >=60)
Globulin: 2.9 g/dL (calc) (ref 1.9–3.7)
Glucose, Bld: 165 mg/dL — ABNORMAL HIGH (ref 65–99)
Potassium: 4.4 mmol/L (ref 3.5–5.3)
Sodium: 133 mmol/L — ABNORMAL LOW (ref 135–146)
Total Bilirubin: 0.2 mg/dL (ref 0.2–1.2)
Total Protein: 7.1 g/dL (ref 6.1–8.1)

## 2019-04-15 LAB — HEMOGLOBIN A1C
Hgb A1c MFr Bld: 8.8 % of total Hgb — ABNORMAL HIGH (ref <5.7)
Mean Plasma Glucose: 206 (calc)
eAG (mmol/L): 11.4 (calc)

## 2019-04-15 NOTE — Progress Notes (Deleted)
Cardiology Office Note  Date:  04/15/2019   ID:  Victoria Austin, DOB Apr 04, 1951, MRN [id number]  PCP:  Hubbard Hartshorn, FNP   No chief complaint on file.   HPI:  Ms. Larine Pruski is a 68 year old woman with past medical history of Hypertension Type 2 diabetes Anxiety Referred by [name] for management of her hypertension    Current medications: Lisinopril 10mg  Clonidine 0.1mg  once daily PRN. Nifedipine 60mg  BID Hydralazine 50mg  TID Propanolol 10mg  BID   PMH:   has a past medical history of Anxiety, Arthritis, Bipolar 1 disorder ([place]) (2006), Depression, Diabetes mellitus without complication ([place]), Fibromyalgia, GERD (gastroesophageal reflux disease), Hodgkin's lymphoma ([place]), Hyperlipidemia, IBS (irritable bowel syndrome), Incontinence, and RA (rheumatoid arthritis) ([place]).  PSH:    Past Surgical History:  Procedure Laterality Date  . BACK SURGERY    . CESAREAN SECTION    . ELBOW ARTHROSCOPY    . FINGER SURGERY    . HAND SURGERY    . HERNIA REPAIR    . SPINE SURGERY      Current Outpatient Medications  Medication Sig Dispense Refill  . ADVAIR HFA 230-21 MCG/ACT inhaler Inhale 1 puff into the lungs 2 (two) times daily. 1 Inhaler 3  . albuterol (PROAIR HFA) 108 (90 Base) MCG/ACT inhaler Take 1 puff by mouth 2 (two) times daily as needed.    Marland Kitchen buPROPion (WELLBUTRIN) 100 MG tablet Take 100 mg by mouth daily.    . carbamazepine (TEGRETOL) 200 MG tablet Take 1 tablet by mouth 3 (three) times daily.    . cloNIDine (CATAPRES) 0.1 MG tablet Take 0.1 mg by mouth daily as needed.     . clopidogrel (PLAVIX) 75 MG tablet Take 1 tablet (75 mg total) by mouth daily. 90 tablet 1  . Continuous Blood Gluc Receiver (FREESTYLE LIBRE 14 DAY READER) DEVI 1 Device by Does not apply route 4 (four) times daily. 1 Device 0  . Continuous Blood Gluc Sensor (FREESTYLE LIBRE 14 DAY SENSOR) MISC 1 Device by Does not apply route every 14 (fourteen) days. 6 each 1  . docusate sodium (COLACE) 100 MG  capsule Take 100 mg by mouth 2 (two) times daily.    . DULoxetine (CYMBALTA) 60 MG capsule Take 1 capsule by mouth daily.    Marland Kitchen EPINEPHrine (EPIPEN 2-PAK) 0.3 mg/0.3 mL IJ SOAJ injection Inject 0.3 mLs (0.3 mg total) into the muscle as needed for anaphylaxis. Then call 911 for ER transport. 0.3 mL 1  . gabapentin (NEURONTIN) 300 MG capsule Take 2 capsules (600 mg total) by mouth at bedtime. 180 capsule 1  . hydrALAZINE (APRESOLINE) 50 MG tablet Take 1 tablet (50 mg total) by mouth 3 (three) times daily. 270 tablet 1  . Icosapent Ethyl 1 g CAPS Take 2 capsules (2 g total) by mouth 2 (two) times daily. 360 capsule 3  . Insulin Glargine (LANTUS SOLOSTAR) 100 UNIT/ML Solostar Pen Inject 80 Units into the skin daily. 10 pen 1  . lisinopril (ZESTRIL) 10 MG tablet     . loratadine (CLARITIN) 10 MG tablet Take 1 tablet (10 mg total) by mouth daily. 90 tablet 1  . LORazepam (ATIVAN) 1 MG tablet Take 1 tablet by mouth daily.    Marland Kitchen MAGNESIUM-OXIDE 400 (241.3 Mg) MG tablet Take 1 tablet (400 mg total) by mouth daily. 90 tablet 0  . meclizine (ANTIVERT) 25 MG tablet Take 1 tablet (25 mg total) by mouth 3 (three) times daily as needed for dizziness. 180 tablet 1  . methocarbamol (  ROBAXIN) 750 MG tablet Take 1 tablet (750 mg total) by mouth 2 (two) times daily as needed for muscle spasms. 180 tablet 1  . mirabegron ER (MYRBETRIQ) 50 MG TB24 tablet Take 1 tablet (50 mg total) by mouth daily. 90 tablet 2  . montelukast (SINGULAIR) 10 MG tablet Take 1 tablet (10 mg total) by mouth daily. 90 tablet 0  . NIFEdipine (ADALAT CC) 60 MG 24 hr tablet Take 1 tablet (60 mg total) by mouth 2 (two) times daily. 180 tablet 1  . NOVOLOG FLEXPEN 100 UNIT/ML FlexPen Inject 14 Units into the skin 3 (three) times daily. With meals 3 pen 3  . pantoprazole (PROTONIX) 40 MG tablet Take 1 tablet (40 mg total) by mouth daily. 90 tablet 1  . primidone (MYSOLINE) 250 MG tablet Take 200 mg by mouth at bedtime.     . promethazine (PHENERGAN)  25 MG tablet Take 1 tablet by mouth every 6 (six) hours as needed.     . propranolol (INDERAL) 10 MG tablet Take 1 tablet (10 mg total) by mouth 2 (two) times daily. 180 tablet 0  . rosuvastatin (CRESTOR) 20 MG tablet Take 1 tablet (20 mg total) by mouth daily. 90 tablet 3  . SURE COMFORT PEN NEEDLES 31G X 5 MM MISC Inject 1 pen as directed 5 (five) times daily as needed. 100 each 3  . traZODone (DESYREL) 50 MG tablet Take 1 tablet by mouth at bedtime.    Marland Kitchen trimethoprim (TRIMPEX) 100 MG tablet Take 1 tablet (100 mg total) by mouth daily. 90 tablet 1  . Vitamin D, Ergocalciferol, (DRISDOL) 1.25 MG (50000 UT) CAPS capsule Take 1 capsule (50,000 Units total) by mouth once a week. 12 capsule 1   No current facility-administered medications for this visit.      Allergies:   Ciprofloxacin, Cyclobenzaprine, Diazepam, Ketorolac, Mushroom extract complex, Neomycin-polymyxin-gramicidin, Ranitidine hcl, and Ondansetron   Social History:  The patient  reports that she has never smoked. She has never used smokeless tobacco. She reports that she does not drink alcohol or use drugs.   Family History:   family history includes Bone cancer in her maternal aunt; Colon cancer in her maternal uncle; Hypertension in her brother, mother, and sister; Lung cancer in her mother.    Review of Systems: ROS   PHYSICAL EXAM: VS:  There were no vitals taken for this visit. , BMI There is no height or weight on file to calculate BMI. GEN: Well nourished, well developed, in no acute distress HEENT: normal Neck: no JVD, carotid bruits, or masses Cardiac: RRR; no murmurs, rubs, or gallops,no edema  Respiratory:  clear to auscultation bilaterally, normal work of breathing GI: soft, nontender, nondistended, + BS MS: no deformity or atrophy Skin: warm and dry, no rash Neuro:  Strength and sensation are intact Psych: euthymic mood, full affect    Recent Labs: 03/01/2019: Hemoglobin 12.9; Magnesium 1.6; Platelets 274;  TSH 1.68 04/14/2019: ALT 7; BUN 20; Creat 1.12; Potassium 4.4; Sodium 133    Lipid Panel Lab Results  Component Value Date   CHOL 186 03/01/2019   HDL 39 (L) 03/01/2019   [place]  03/01/2019     Comment:     . LDL cholesterol not calculated. Triglyceride levels greater than 400 mg/dL invalidate calculated LDL results. . Reference range: <100 . Desirable range <100 mg/dL for primary prevention;   <70 mg/dL for patients with CHD or diabetic patients  with > or = 2 CHD risk factors. Marland Kitchen LDL-C  is now calculated using the Martin-Hopkins  calculation, which is a validated novel method providing  better accuracy than the Friedewald equation in the  estimation of LDL-C.  Cresenciano Genre et al. Annamaria Helling. [id number]): 2061-2068  (http://education.[employer].com/faq/FAQ164)    TRIG 413 (H) 03/01/2019      Wt Readings from Last 3 Encounters:  04/13/19 181 lb (82.1 kg)  03/26/19 180 lb (81.6 kg)  01/28/19 176 lb (79.8 kg)       ASSESSMENT AND PLAN:  Problem List Items Addressed This Visit    None       Disposition:   F/U  12 months   Total encounter time more than 25 minutes  Greater than 50% was spent in counseling and coordination of care with the patient    Signed, Esmond Plants, M.D., Ph.D. [place], [place]

## 2019-04-19 ENCOUNTER — Ambulatory Visit: Payer: Medicare Other | Admitting: Cardiovascular Disease

## 2019-04-20 ENCOUNTER — Inpatient Hospital Stay: Payer: Medicare Other | Admitting: Oncology

## 2019-04-20 ENCOUNTER — Inpatient Hospital Stay: Payer: Medicare Other

## 2019-04-22 ENCOUNTER — Inpatient Hospital Stay: Payer: Medicare Other | Attending: Oncology

## 2019-04-22 ENCOUNTER — Ambulatory Visit (INDEPENDENT_AMBULATORY_CARE_PROVIDER_SITE_OTHER): Payer: Medicare Other

## 2019-04-22 VITALS — Ht 64.0 in | Wt 181.0 lb

## 2019-04-22 DIAGNOSIS — F319 Bipolar disorder, unspecified: Secondary | ICD-10-CM | POA: Insufficient documentation

## 2019-04-22 DIAGNOSIS — Z801 Family history of malignant neoplasm of trachea, bronchus and lung: Secondary | ICD-10-CM | POA: Insufficient documentation

## 2019-04-22 DIAGNOSIS — E119 Type 2 diabetes mellitus without complications: Secondary | ICD-10-CM | POA: Insufficient documentation

## 2019-04-22 DIAGNOSIS — Z1231 Encounter for screening mammogram for malignant neoplasm of breast: Secondary | ICD-10-CM

## 2019-04-22 DIAGNOSIS — Z8571 Personal history of Hodgkin lymphoma: Secondary | ICD-10-CM | POA: Insufficient documentation

## 2019-04-22 DIAGNOSIS — Z780 Asymptomatic menopausal state: Secondary | ICD-10-CM

## 2019-04-22 DIAGNOSIS — Z800 Family history of malignant neoplasm of digestive organs: Secondary | ICD-10-CM | POA: Insufficient documentation

## 2019-04-22 DIAGNOSIS — Z7951 Long term (current) use of inhaled steroids: Secondary | ICD-10-CM | POA: Insufficient documentation

## 2019-04-22 DIAGNOSIS — E785 Hyperlipidemia, unspecified: Secondary | ICD-10-CM | POA: Insufficient documentation

## 2019-04-22 DIAGNOSIS — Z794 Long term (current) use of insulin: Secondary | ICD-10-CM | POA: Insufficient documentation

## 2019-04-22 DIAGNOSIS — E871 Hypo-osmolality and hyponatremia: Secondary | ICD-10-CM | POA: Insufficient documentation

## 2019-04-22 DIAGNOSIS — Z87440 Personal history of urinary (tract) infections: Secondary | ICD-10-CM | POA: Insufficient documentation

## 2019-04-22 DIAGNOSIS — M069 Rheumatoid arthritis, unspecified: Secondary | ICD-10-CM | POA: Insufficient documentation

## 2019-04-22 DIAGNOSIS — Z79899 Other long term (current) drug therapy: Secondary | ICD-10-CM | POA: Insufficient documentation

## 2019-04-22 DIAGNOSIS — Z9221 Personal history of antineoplastic chemotherapy: Secondary | ICD-10-CM | POA: Insufficient documentation

## 2019-04-22 DIAGNOSIS — Z0000 Encounter for general adult medical examination without abnormal findings: Secondary | ICD-10-CM

## 2019-04-22 DIAGNOSIS — F419 Anxiety disorder, unspecified: Secondary | ICD-10-CM | POA: Insufficient documentation

## 2019-04-22 DIAGNOSIS — K589 Irritable bowel syndrome without diarrhea: Secondary | ICD-10-CM | POA: Insufficient documentation

## 2019-04-22 NOTE — Progress Notes (Signed)
Subjective:   Victoria Austin is a 68 y.o. female who presents for an Initial Medicare Annual Wellness Visit.  Virtual Visit via Telephone Note  I connected with Victoria Austin on 04/22/19 at 11:20 AM EDT by telephone and verified that I am speaking with the correct person using two identifiers.  Medicare Annual Wellness visit completed telephonically due to Covid-19 pandemic.   Location: Patient: home Provider: office   I discussed the limitations, risks, security and privacy concerns of performing an evaluation and management service by telephone and the availability of in person appointments. The patient expressed understanding and agreed to proceed.  Some vital signs may be absent or patient reported.   Clemetine Marker, LPN    Review of Systems      Cardiac Risk Factors include: advanced age (>30men, >39 women);diabetes mellitus;dyslipidemia;hypertension;obesity (BMI >30kg/m2);sedentary lifestyle     Objective:    Today's Vitals   04/22/19 1129 04/22/19 1130  Weight: 181 lb (82.1 kg)   Height: 5\' 4"  (1.626 m)   PainSc:  9    Body mass index is 31.07 kg/m.  Advanced Directives 04/22/2019 01/18/2019 12/29/2018 12/22/2018  Does Patient Have a Medical Advance Directive? Yes No No No  Type of Paramedic of [place];Living will - - -  Does patient want to make changes to medical advance directive? - - No - Patient declined Yes (MAU/Ambulatory/Procedural Areas - Information given)  Copy of [place] in Chart? No - copy requested - - -  Would patient like information on creating a medical advance directive? - - No - Patient declined -    Current Medications (verified) Outpatient Encounter Medications as of 04/22/2019  Medication Sig  . ADVAIR HFA 230-21 MCG/ACT inhaler Inhale 1 puff into the lungs 2 (two) times daily.  Marland Kitchen albuterol (PROAIR HFA) 108 (90 Base) MCG/ACT inhaler Take 1 puff by mouth 2 (two) times daily as needed.  Marland Kitchen buPROPion  (WELLBUTRIN) 100 MG tablet Take 100 mg by mouth daily.  . carbamazepine (TEGRETOL) 200 MG tablet Take 1 tablet by mouth 3 (three) times daily.  . cloNIDine (CATAPRES) 0.1 MG tablet Take 0.1 mg by mouth daily as needed.   . clopidogrel (PLAVIX) 75 MG tablet Take 1 tablet (75 mg total) by mouth daily.  . Continuous Blood Gluc Receiver (FREESTYLE LIBRE 14 DAY READER) DEVI 1 Device by Does not apply route 4 (four) times daily.  . Continuous Blood Gluc Sensor (FREESTYLE LIBRE 14 DAY SENSOR) MISC 1 Device by Does not apply route every 14 (fourteen) days.  . cyanocobalamin (,VITAMIN B-12,) 1000 MCG/ML injection Inject 1,000 mcg into the muscle once. Once a month  . docusate sodium (COLACE) 100 MG capsule Take 100 mg by mouth 2 (two) times daily.  . DULoxetine (CYMBALTA) 60 MG capsule Take 1 capsule by mouth daily.  Marland Kitchen EPINEPHrine (EPIPEN 2-PAK) 0.3 mg/0.3 mL IJ SOAJ injection Inject 0.3 mLs (0.3 mg total) into the muscle as needed for anaphylaxis. Then call 911 for ER transport.  . gabapentin (NEURONTIN) 300 MG capsule Take 2 capsules (600 mg total) by mouth at bedtime.  . hydrALAZINE (APRESOLINE) 50 MG tablet Take 1 tablet (50 mg total) by mouth 3 (three) times daily.  . Insulin Glargine (LANTUS SOLOSTAR) 100 UNIT/ML Solostar Pen Inject 80 Units into the skin daily. (Patient taking differently: Inject 85 Units into the skin daily. )  . lisinopril (ZESTRIL) 10 MG tablet   . loratadine (CLARITIN) 10 MG tablet Take 1 tablet (10 mg  total) by mouth daily.  Marland Kitchen LORazepam (ATIVAN) 1 MG tablet Take 1 tablet by mouth daily.  Marland Kitchen MAGNESIUM-OXIDE 400 (241.3 Mg) MG tablet Take 1 tablet (400 mg total) by mouth daily.  . meclizine (ANTIVERT) 25 MG tablet Take 1 tablet (25 mg total) by mouth 3 (three) times daily as needed for dizziness.  . methocarbamol (ROBAXIN) 750 MG tablet Take 1 tablet (750 mg total) by mouth 2 (two) times daily as needed for muscle spasms.  . mirabegron ER (MYRBETRIQ) 50 MG TB24 tablet Take 1 tablet  (50 mg total) by mouth daily.  . montelukast (SINGULAIR) 10 MG tablet Take 1 tablet (10 mg total) by mouth daily.  Marland Kitchen NIFEdipine (ADALAT CC) 60 MG 24 hr tablet Take 1 tablet (60 mg total) by mouth 2 (two) times daily.  Marland Kitchen NOVOLOG FLEXPEN 100 UNIT/ML FlexPen Inject 14 Units into the skin 3 (three) times daily. With meals (Patient taking differently: Inject 14 Units into the skin 3 (three) times daily. With meals - sliding scale)  . pantoprazole (PROTONIX) 40 MG tablet Take 1 tablet (40 mg total) by mouth daily.  . primidone (MYSOLINE) 250 MG tablet Take 200 mg by mouth at bedtime.   . promethazine (PHENERGAN) 25 MG tablet Take 1 tablet by mouth every 6 (six) hours as needed.   . propranolol (INDERAL) 10 MG tablet Take 1 tablet (10 mg total) by mouth 2 (two) times daily.  . rosuvastatin (CRESTOR) 20 MG tablet Take 1 tablet (20 mg total) by mouth daily.  . SURE COMFORT PEN NEEDLES 31G X 5 MM MISC Inject 1 pen as directed 5 (five) times daily as needed.  . traZODone (DESYREL) 50 MG tablet Take 1 tablet by mouth at bedtime.  Marland Kitchen trimethoprim (TRIMPEX) 100 MG tablet Take 1 tablet (100 mg total) by mouth daily.  . Vitamin D, Ergocalciferol, (DRISDOL) 1.25 MG (50000 UT) CAPS capsule Take 1 capsule (50,000 Units total) by mouth once a week.  Vanessa Kick Ethyl 1 g CAPS Take 2 capsules (2 g total) by mouth 2 (two) times daily.   No facility-administered encounter medications on file as of 04/22/2019.     Allergies (verified) Ciprofloxacin, Cyclobenzaprine, Diazepam, Ketorolac, Mushroom extract complex, Neomycin-polymyxin-gramicidin, Ranitidine hcl, and Ondansetron   History: Past Medical History:  Diagnosis Date  . Anxiety   . Arthritis   . Bipolar 1 disorder ([place]) 2006  . Depression   . Diabetes mellitus without complication ([place])   . Fibromyalgia   . GERD (gastroesophageal reflux disease)   . Hodgkin's lymphoma ([place])   . Hyperlipidemia   . IBS (irritable bowel syndrome)   . Incontinence   . RA  (rheumatoid arthritis) ([place])    Past Surgical History:  Procedure Laterality Date  . BACK SURGERY    . CESAREAN SECTION    . ELBOW ARTHROSCOPY    . FINGER SURGERY    . HAND SURGERY    . HERNIA REPAIR    . SPINE SURGERY     Family History  Problem Relation Age of Onset  . Lung cancer Mother   . Hypertension Mother   . Hypertension Sister   . Hypertension Brother   . Bone cancer Maternal Aunt   . Colon cancer Maternal Uncle    Social History   Socioeconomic History  . Marital status: Legally Separated    Spouse name: Not on file  . Number of children: 2  . Years of education: Not on file  . Highest education level: Not on file  Occupational History  . Occupation: retired  Scientific laboratory technician  . Financial resource strain: Not hard at all  . Food insecurity    Worry: Never true    Inability: Never true  . Transportation needs    Medical: No    Non-medical: No  Tobacco Use  . Smoking status: Never Smoker  . Smokeless tobacco: Never Used  Substance and Sexual Activity  . Alcohol use: Never    Frequency: Never  . Drug use: Never  . Sexual activity: Not Currently    Partners: Male  Lifestyle  . Physical activity    Days per week: 0 days    Minutes per session: 0 min  . Stress: Very much  Relationships  . Social connections    Talks on phone: More than three times a week    Gets together: More than three times a week    Attends religious service: Never    Active member of club or organization: No    Attends meetings of clubs or organizations: Never    Relationship status: Separated  Other Topics Concern  . Not on file  Social History Narrative  . Not on file    Tobacco Counseling Counseling given: Not Answered   Clinical Intake:  Pre-visit preparation completed: Yes  Pain Score: 9  Pain Type: Chronic pain Pain Location: Back     BMI - recorded: 31.07 Nutritional Status: BMI > 30  Obese Nutritional Risks: None Diabetes: Yes CBG done?: No Did pt.  bring in CBG monitor from home?: No   Nutrition Risk Assessment:  Has the patient had any N/V/D within the last 2 months?  No  Does the patient have any non-healing wounds?  No  Has the patient had any unintentional weight loss or weight gain?  No   Diabetes:  Is the patient diabetic?  Yes  If diabetic, was a CBG obtained today?  No  Did the patient bring in their glucometer from home?  No  How often do you monitor your CBG's? 3-4 times daily.   Financial Strains and Diabetes Management:  Are you having any financial strains with the device, your supplies or your medication? No .  Does the patient want to be seen by Chronic Care Management for management of their diabetes?  No  Would the patient like to be referred to a Nutritionist or for Diabetic Management?  No   Diabetic Exams:  Diabetic Eye Exam: Completed 12/28/18 negative retinopathy.   Diabetic Foot Exam: Completed 03/26/19.  How often do you need to have someone help you when you read instructions, pamphlets, or other written materials from your doctor or pharmacy?: 1 - Never  Interpreter Needed?: No  Information entered by :: Clemetine Marker LPN   Activities of Daily Living In your present state of health, do you have any difficulty performing the following activities: 04/22/2019 03/26/2019  Hearing? N N  Comment declines hearing aids -  Vision? Y Y  Difficulty concentrating or making decisions? N N  Walking or climbing stairs? N N  Dressing or bathing? N N  Doing errands, shopping? N N  Preparing Food and eating ? N -  Using the Toilet? N -  In the past six months, have you accidently leaked urine? N -  Do you have problems with loss of bowel control? N -  Managing your Medications? N -  Managing your Finances? N -  Housekeeping or managing your Housekeeping? N -  Some recent data might be hidden  Immunizations and Health Maintenance Immunization History  Administered Date(s) Administered  . Fluad  Quad(high Dose 65+) 04/14/2019  . Influenza,inj,quad, With Preservative 04/20/2018  . Pneumococcal-Unspecified 04/20/2018  . Tdap 04/14/2019   Health Maintenance Due  Topic Date Due  . PNA vac Low Risk Adult (2 of 2 - PCV13) 04/21/2019    Patient Care Team: Hubbard Hartshorn, FNP as PCP - General (Family Medicine) Cathi Roan, [facility] (Pharmacist)  Indicate any recent Medical Services you may have received from other than [facility] providers in the past year (date may be approximate).     Assessment:   This is a routine wellness examination for [place].  Hearing/Vision screen  Hearing Screening   125Hz  250Hz  500Hz  1000Hz  2000Hz  3000Hz  4000Hz  6000Hz  8000Hz   Right ear:           Left ear:           Comments: Pt denies hearing difficulty   Vision Screening Comments: Pt c/o floater in right eye. [facility] screen[facility] at [facility]  Dietary issues and exercise activities discussed: Current Exercise Habits: The patient does not participate in regular exercise at present, Exercise limited by: orthopedic condition(s)  Goals   None    Depression Screen PHQ 2/9 Scores 04/22/2019 04/13/2019 04/13/2019 03/26/2019 03/01/2019 01/08/2019 01/01/2019  PHQ - 2 Score 6 0 0 2 6 6  0  PHQ- 9 Score 11 1 0 12 11 27 3     Fall Risk Fall Risk  04/22/2019 04/13/2019 03/26/2019 03/01/2019 01/08/2019  Falls in the past year? 1 1 1 1 1   Comment - - April 2020 - -  Number falls in past yr: 1 1 0 0 1  Injury with Fall? 1 1 1  0 1  Comment - - Contusion in the back of her head - -  Risk for fall due to : History of fall(s);Impaired vision - - - Impaired balance/gait;History of fall(s);Impaired mobility  Follow up Falls prevention discussed - - - -   FALL RISK PREVENTION PERTAINING TO THE HOME:  Any stairs in or around the home? Yes  If so, do they handrails? Yes   Home free of loose throw rugs in walkways, pet beds, electrical cords, etc? Yes  Adequate lighting in your home to reduce risk of falls? Yes    ASSISTIVE DEVICES UTILIZED TO PREVENT FALLS:  Life alert? No  Use of a cane, walker or w/c? Yes  Grab bars in the bathroom? Yes  Shower chair or bench in shower? Yes  Elevated toilet seat or a handicapped toilet? No  DME ORDERS:  DME order needed?  No   TIMED UP AND GO:  Was the test performed? No . Telephonic visit.   Education: Fall risk prevention has been discussed.  Intervention(s) required? No   Cognitive Function: pt declined 6CIT         Screening Tests Health Maintenance  Topic Date Due  . PNA vac Low Risk Adult (2 of 2 - PCV13) 04/21/2019  . MAMMOGRAM  07/11/2019  . HEMOGLOBIN A1C  10/12/2019  . OPHTHALMOLOGY EXAM  12/28/2019  . FOOT EXAM  03/25/2020  . COLONOSCOPY  09/15/2021  . TETANUS/TDAP  04/13/2029  . INFLUENZA VACCINE  Completed  . DEXA SCAN  Completed  . Hepatitis C Screening  Completed    Qualifies for Shingles Vaccine? Yes  Pt reports she received a shingles vaccine in [place] but does not know what kind and plans to contact the pharmacy there for information.   Tdap:  Up to date  Flu Vaccine: Up to date  Pneumococcal Vaccine: Up to date   Cancer Screenings:  Colorectal Screening: Completed 09/15/18. Repeat every 3 years  Mammogram: Completed 07/10/17. Repeat every year.. Ordered today. Pt provided with contact information and advised to call to schedule appt.   Bone Density: Completed per patient in 2017. Results reflect unavailable. Repeat every 2 years. Ordered today. Pt provided with contact information and advised to call to schedule appt.   Lung Cancer Screening: (Low Dose CT Chest recommended if Age 38-80 years, 30 pack-year currently smoking OR have quit w/in 15years.) does not qualify.   Additional Screening:  Hepatitis C Screening: does qualify; Completed 12/11/18  Vision Screening: Recommended annual ophthalmology exams for early detection of glaucoma and other disorders of the eye. Is the patient up to date with their annual  eye exam?  Yes  Who is the provider or what is the name of the office in which the pt attends annual eye exams? [place] Screening: Recommended annual dental exams for proper oral hygiene  Community Resource Referral:  CRR required this visit?  No      Plan:    I have personally reviewed and addressed the Medicare Annual Wellness questionnaire and have noted the following in the patient's chart:  A. Medical and social history B. Use of alcohol, tobacco or illicit drugs  C. Current medications and supplements D. Functional ability and status E.  Nutritional status F.  Physical activity G. Advance directives H. List of other physicians I.  Hospitalizations, surgeries, and ER visits in previous 12 months J.  [place] such as hearing and vision if needed, cognitive and depression L. Referrals and appointments   In addition, I have reviewed and discussed with patient certain preventive protocols, quality metrics, and best practice recommendations. A written personalized care plan for preventive services as well as general preventive health recommendations were provided to patient.   Signed,  Clemetine Marker, LPN Nurse Health Advisor   Nurse Notes: pt c/o stress due to helping grandchildren with virtual learning and pain in lower back due to fracture.

## 2019-04-22 NOTE — Patient Instructions (Signed)
[name] , Thank you for taking time to come for your Medicare Wellness Visit. I appreciate your ongoing commitment to your health goals. Please review the following plan we discussed and let me know if I can assist you in the future.   Screening recommendations/referrals: Colonoscopy: done 09/15/18. Repeat in 2023 Mammogram: done 07/10/17. Please call [phone] to schedule your mammogram and bone density.   Recommended yearly ophthalmology/optometry visit for glaucoma screening and checkup Recommended yearly dental visit for hygiene and checkup  Vaccinations: Influenza vaccine: done 04/14/19 Pneumococcal vaccine: done 04/20/18 Tdap vaccine: done 04/14/19 Shingles vaccine: Shingrix discussed. Please contact your pharmacy for coverage information.   Advanced directives: Please bring a copy of your health care power of attorney and living will to the office at your convenience once you have completed those documents.   Conditions/risks identified: Recommend preventing falls  Next appointment: Please follow up in one year for your Medicare Annual Wellness visit.     Preventive Care 67 Years and Older, Female Preventive care refers to lifestyle choices and visits with your health care provider that can promote health and wellness. What does preventive care include?  A yearly physical exam. This is also called an annual well check.  Dental exams once or twice a year.  Routine eye exams. Ask your health care provider how often you should have your eyes checked.  Personal lifestyle choices, including:  Daily care of your teeth and gums.  Regular physical activity.  Eating a healthy diet.  Avoiding tobacco and drug use.  Limiting alcohol use.  Practicing safe sex.  Taking low-dose aspirin every day.  Taking vitamin and mineral supplements as recommended by your health care provider. What happens during an annual well check? The services and screenings done by your health  care provider during your annual well check will depend on your age, overall health, lifestyle risk factors, and family history of disease. Counseling  Your health care provider may ask you questions about your:  Alcohol use.  Tobacco use.  Drug use.  Emotional well-being.  Home and relationship well-being.  Sexual activity.  Eating habits.  History of falls.  Memory and ability to understand (cognition).  Work and work Statistician.  Reproductive health. Screening  You may have the following tests or measurements:  Height, weight, and BMI.  Blood pressure.  Lipid and cholesterol levels. These may be checked every 5 years, or more frequently if you are over 37 years old.  Skin check.  Lung cancer screening. You may have this screening every year starting at age 32 if you have a 30-pack-year history of smoking and currently smoke or have quit within the past 15 years.  Fecal occult blood test (FOBT) of the stool. You may have this test every year starting at age 30.  Flexible sigmoidoscopy or colonoscopy. You may have a sigmoidoscopy every 5 years or a colonoscopy every 10 years starting at age 50.  Hepatitis C blood test.  Hepatitis B blood test.  Sexually transmitted disease (STD) testing.  Diabetes screening. This is done by checking your blood sugar (glucose) after you have not eaten for a while (fasting). You may have this done every 1-3 years.  Bone density scan. This is done to screen for osteoporosis. You may have this done starting at age 23.  Mammogram. This may be done every 1-2 years. Talk to your health care provider about how often you should have regular mammograms. Talk with your health care provider about your test results,  treatment options, and if necessary, the need for more tests. Vaccines  Your health care provider may recommend certain vaccines, such as:  Influenza vaccine. This is recommended every year.  Tetanus, diphtheria, and  acellular pertussis (Tdap, Td) vaccine. You may need a Td booster every 10 years.  Zoster vaccine. You may need this after age 74.  Pneumococcal 13-valent conjugate (PCV13) vaccine. One dose is recommended after age 55.  Pneumococcal polysaccharide (PPSV23) vaccine. One dose is recommended after age 59. Talk to your health care provider about which screenings and vaccines you need and how often you need them. This information is not intended to replace advice given to you by your health care provider. Make sure you discuss any questions you have with your health care provider. Document Released: 08/11/2015 Document Revised: 04/03/2016 Document Reviewed: 05/16/2015 Elsevier Interactive Patient Education  2017 [place] Prevention in the Home Falls can cause injuries. They can happen to people of all ages. There are many things you can do to make your home safe and to help prevent falls. What can I do on the outside of my home?  Regularly fix the edges of walkways and driveways and fix any cracks.  Remove anything that might make you trip as you walk through a door, such as a raised step or threshold.  Trim any bushes or trees on the path to your home.  Use bright outdoor lighting.  Clear any walking paths of anything that might make someone trip, such as rocks or tools.  Regularly check to see if handrails are loose or broken. Make sure that both sides of any steps have handrails.  Any raised decks and porches should have guardrails on the edges.  Have any leaves, snow, or ice cleared regularly.  Use sand or salt on walking paths during winter.  Clean up any spills in your garage right away. This includes oil or grease spills. What can I do in the bathroom?  Use night lights.  Install grab bars by the toilet and in the tub and shower. Do not use towel bars as grab bars.  Use non-skid mats or decals in the tub or shower.  If you need to sit down in the shower, use  a plastic, non-slip stool.  Keep the floor dry. Clean up any water that spills on the floor as soon as it happens.  Remove soap buildup in the tub or shower regularly.  Attach bath mats securely with double-sided non-slip rug tape.  Do not have throw rugs and other things on the floor that can make you trip. What can I do in the bedroom?  Use night lights.  Make sure that you have a light by your bed that is easy to reach.  Do not use any sheets or blankets that are too big for your bed. They should not hang down onto the floor.  Have a firm chair that has side arms. You can use this for support while you get dressed.  Do not have throw rugs and other things on the floor that can make you trip. What can I do in the kitchen?  Clean up any spills right away.  Avoid walking on wet floors.  Keep items that you use a lot in easy-to-reach places.  If you need to reach something above you, use a strong step stool that has a grab bar.  Keep electrical cords out of the way.  Do not use floor polish or wax that makes floors  slippery. If you must use wax, use non-skid floor wax.  Do not have throw rugs and other things on the floor that can make you trip. What can I do with my stairs?  Do not leave any items on the stairs.  Make sure that there are handrails on both sides of the stairs and use them. Fix handrails that are broken or loose. Make sure that handrails are as long as the stairways.  Check any carpeting to make sure that it is firmly attached to the stairs. Fix any carpet that is loose or worn.  Avoid having throw rugs at the top or bottom of the stairs. If you do have throw rugs, attach them to the floor with carpet tape.  Make sure that you have a light switch at the top of the stairs and the bottom of the stairs. If you do not have them, ask someone to add them for you. What else can I do to help prevent falls?  Wear shoes that:  Do not have high heels.  Have  rubber bottoms.  Are comfortable and fit you well.  Are closed at the toe. Do not wear sandals.  If you use a stepladder:  Make sure that it is fully opened. Do not climb a closed stepladder.  Make sure that both sides of the stepladder are locked into place.  Ask someone to hold it for you, if possible.  Clearly mark and make sure that you can see:  Any grab bars or handrails.  First and last steps.  Where the edge of each step is.  Use tools that help you move around (mobility aids) if they are needed. These include:  Canes.  Walkers.  Scooters.  Crutches.  Turn on the lights when you go into a dark area. Replace any light bulbs as soon as they burn out.  Set up your furniture so you have a clear path. Avoid moving your furniture around.  If any of your floors are uneven, fix them.  If there are any pets around you, be aware of where they are.  Review your medicines with your doctor. Some medicines can make you feel dizzy. This can increase your chance of falling. Ask your doctor what other things that you can do to help prevent falls. This information is not intended to replace advice given to you by your health care provider. Make sure you discuss any questions you have with your health care provider. Document Released: 05/11/2009 Document Revised: 12/21/2015 Document Reviewed: 08/19/2014 Elsevier Interactive Patient Education  2017 [employer].

## 2019-04-26 ENCOUNTER — Other Ambulatory Visit: Payer: Self-pay

## 2019-04-26 ENCOUNTER — Inpatient Hospital Stay: Payer: Medicare Other

## 2019-04-26 ENCOUNTER — Inpatient Hospital Stay (HOSPITAL_BASED_OUTPATIENT_CLINIC_OR_DEPARTMENT_OTHER): Payer: Medicare Other | Admitting: Oncology

## 2019-04-26 VITALS — BP 164/83 | HR 82 | Temp 95.8°F | Resp 20 | Wt 188.0 lb

## 2019-04-26 DIAGNOSIS — Z801 Family history of malignant neoplasm of trachea, bronchus and lung: Secondary | ICD-10-CM | POA: Diagnosis not present

## 2019-04-26 DIAGNOSIS — Z9221 Personal history of antineoplastic chemotherapy: Secondary | ICD-10-CM | POA: Diagnosis not present

## 2019-04-26 DIAGNOSIS — Z8579 Personal history of other malignant neoplasms of lymphoid, hematopoietic and related tissues: Secondary | ICD-10-CM

## 2019-04-26 DIAGNOSIS — E785 Hyperlipidemia, unspecified: Secondary | ICD-10-CM | POA: Diagnosis not present

## 2019-04-26 DIAGNOSIS — Z87440 Personal history of urinary (tract) infections: Secondary | ICD-10-CM | POA: Diagnosis not present

## 2019-04-26 DIAGNOSIS — F319 Bipolar disorder, unspecified: Secondary | ICD-10-CM | POA: Diagnosis not present

## 2019-04-26 DIAGNOSIS — K589 Irritable bowel syndrome without diarrhea: Secondary | ICD-10-CM | POA: Diagnosis not present

## 2019-04-26 DIAGNOSIS — M069 Rheumatoid arthritis, unspecified: Secondary | ICD-10-CM | POA: Diagnosis not present

## 2019-04-26 DIAGNOSIS — Z800 Family history of malignant neoplasm of digestive organs: Secondary | ICD-10-CM | POA: Diagnosis not present

## 2019-04-26 DIAGNOSIS — Z8571 Personal history of Hodgkin lymphoma: Secondary | ICD-10-CM | POA: Diagnosis not present

## 2019-04-26 DIAGNOSIS — E871 Hypo-osmolality and hyponatremia: Secondary | ICD-10-CM | POA: Diagnosis not present

## 2019-04-26 DIAGNOSIS — E119 Type 2 diabetes mellitus without complications: Secondary | ICD-10-CM | POA: Diagnosis not present

## 2019-04-26 DIAGNOSIS — F419 Anxiety disorder, unspecified: Secondary | ICD-10-CM | POA: Diagnosis not present

## 2019-04-26 DIAGNOSIS — Z794 Long term (current) use of insulin: Secondary | ICD-10-CM | POA: Diagnosis not present

## 2019-04-26 DIAGNOSIS — F32A Depression, unspecified: Secondary | ICD-10-CM

## 2019-04-26 DIAGNOSIS — Z79899 Other long term (current) drug therapy: Secondary | ICD-10-CM | POA: Diagnosis not present

## 2019-04-26 DIAGNOSIS — F329 Major depressive disorder, single episode, unspecified: Secondary | ICD-10-CM

## 2019-04-26 DIAGNOSIS — I2510 Atherosclerotic heart disease of native coronary artery without angina pectoris: Secondary | ICD-10-CM

## 2019-04-26 DIAGNOSIS — Z7951 Long term (current) use of inhaled steroids: Secondary | ICD-10-CM | POA: Diagnosis not present

## 2019-04-26 LAB — COMPREHENSIVE METABOLIC PANEL
ALT: 9 U/L (ref 0–44)
AST: 19 U/L (ref 15–41)
Albumin: 4.2 g/dL (ref 3.5–5.0)
Alkaline Phosphatase: 108 U/L (ref 38–126)
Anion gap: 8 (ref 5–15)
BUN: 20 mg/dL (ref 8–23)
CO2: 23 mmol/L (ref 22–32)
Calcium: 8.9 mg/dL (ref 8.9–10.3)
Chloride: 98 mmol/L (ref 98–111)
Creatinine, Ser: 1.11 mg/dL — ABNORMAL HIGH (ref 0.44–1.00)
GFR calc Af Amer: 60 mL/min — ABNORMAL LOW (ref >60)
GFR calc non Af Amer: 52 mL/min — ABNORMAL LOW (ref >60)
Glucose, Bld: 119 mg/dL — ABNORMAL HIGH (ref 70–99)
Potassium: 4.9 mmol/L (ref 3.5–5.1)
Sodium: 129 mmol/L — ABNORMAL LOW (ref 135–145)
Total Bilirubin: 0.3 mg/dL (ref 0.3–1.2)
Total Protein: 7.8 g/dL (ref 6.5–8.1)

## 2019-04-26 LAB — CBC WITH DIFFERENTIAL/PLATELET
Abs Immature Granulocytes: 0.06 10*3/uL (ref 0.00–0.07)
Basophils Absolute: 0.1 10*3/uL (ref 0.0–0.1)
Basophils Relative: 1 %
Eosinophils Absolute: 0.2 10*3/uL (ref 0.0–0.5)
Eosinophils Relative: 2 %
HCT: 33.5 % — ABNORMAL LOW (ref 36.0–46.0)
Hemoglobin: 11.5 g/dL — ABNORMAL LOW (ref 12.0–15.0)
Immature Granulocytes: 1 %
Lymphocytes Relative: 29 %
Lymphs Abs: 2.3 10*3/uL (ref 0.7–4.0)
MCH: 29 pg (ref 26.0–34.0)
MCHC: 34.3 g/dL (ref 30.0–36.0)
MCV: 84.4 fL (ref 80.0–100.0)
Monocytes Absolute: 0.8 10*3/uL (ref 0.1–1.0)
Monocytes Relative: 10 %
Neutro Abs: 4.5 10*3/uL (ref 1.7–7.7)
Neutrophils Relative %: 57 %
Platelets: 257 10*3/uL (ref 150–400)
RBC: 3.97 MIL/uL (ref 3.87–5.11)
RDW: 13.3 % (ref 11.5–15.5)
WBC: 7.8 10*3/uL (ref 4.0–10.5)
nRBC: 0 % (ref 0.0–0.2)

## 2019-04-26 LAB — SEDIMENTATION RATE: Sed Rate: 35 mm/hr — ABNORMAL HIGH (ref 0–30)

## 2019-04-26 LAB — LACTATE DEHYDROGENASE: LDH: 145 U/L (ref 98–192)

## 2019-04-26 NOTE — Progress Notes (Signed)
Patient does not offer any problems today.  

## 2019-04-27 ENCOUNTER — Encounter: Payer: Self-pay | Admitting: Oncology

## 2019-04-27 NOTE — Progress Notes (Signed)
Hematology/Oncology  Follow up note [facility] [phone] [phone]   Patient Care Team: Hubbard Hartshorn, FNP as PCP - General (Family Medicine) Cathi Roan, [facility] (Pharmacist)  REFERRING PROVIDER: Hubbard Hartshorn, FNP  CHIEF COMPLAINTS/REASON FOR VISIT:  Follow up for history of nodular sclerosing Hodgkin's lymphoma  HISTORY OF PRESENTING ILLNESS:   Victoria Austin is a  68 y.o.  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 Oncology notes and pathology reports were not available to me. Denies weight loss, fever, chills, fatigue, night sweats.   Patient reports being quite stressed recently and depressed.  No thoughts of harming herself. March 2020, patient  L1 burst fracture after mechanical ground-level fall at a doctor's office.  Currently wearing back brace.  # Medical record review was performed by me. 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  Finished in 2014.  Patient has stayed in remission since then  Victoria Austin is a 68 y.o. female who has above history reviewed by me today presents for follow up visit for management of history of nodular sclerosing Hodgkin's lymphoma Problems and complaints are listed below: Patient reports feeling okay at baseline. He continues to have back pain secondary to compression fracture secondary to falls. Patient is following up with spine specialty at [facility].  Last visit was 12/04/2018.  She takes muscle relaxer and gabapentin.  Ongoing bilateral back and hip pain.  Chronic pain in her thighs. Patient was seen by primary care provider on 04/13/2019.  Note was reviewed. Today patient denies any unintentional weight loss, night sweating, chest pain, abdominal  pain. She told me that her sodium level is always low. History of recurrent UTI, she follows up with urology and currently taking suppressive antibiotics and has been doing well.  Denies any dysuria today.   Review of Systems  Constitutional: Negative for appetite change, chills, fatigue and fever.  HENT:   Negative for hearing loss and voice change.   Eyes: Negative for eye problems.  Respiratory: Negative for chest tightness and cough.   Cardiovascular: Negative for chest pain.  Gastrointestinal: Negative for abdominal distention, abdominal pain and blood in stool.  Endocrine: Negative for hot flashes.  Genitourinary: Negative for difficulty urinating and frequency.   Musculoskeletal: Positive for back pain. Negative for arthralgias.  Skin: Negative for itching and rash.  Neurological: Negative for extremity weakness.  Hematological: Negative for adenopathy.  Psychiatric/Behavioral: Negative for confusion and depression. The patient is not nervous/anxious.     MEDICAL HISTORY:  Past Medical History:  Diagnosis Date  . Anxiety   . Arthritis   . Bipolar 1 disorder ([place]) 2006  . Depression   . Diabetes mellitus without complication ([place])   . Fibromyalgia   . GERD (gastroesophageal reflux disease)   . Hodgkin's lymphoma ([place])   . Hyperlipidemia   . IBS (irritable bowel syndrome)   . Incontinence   . RA (rheumatoid arthritis) ([place])     SURGICAL HISTORY: Past Surgical History:  Procedure Laterality Date  . BACK SURGERY    . CESAREAN SECTION    . ELBOW ARTHROSCOPY    . FINGER SURGERY    . HAND SURGERY    . HERNIA REPAIR    . SPINE SURGERY      SOCIAL HISTORY:  Social History   Socioeconomic History  . Marital status: Legally Separated    Spouse name: Not on file  . Number of children: 2  . Years of education: Not on file  . Highest education level: Not on file  Occupational History  . Occupation: retired  Scientific laboratory technician  . Financial resource strain: Not hard at  all  . Food insecurity    Worry: Never true    Inability: Never true  . Transportation needs    Medical: No    Non-medical: No  Tobacco Use  . Smoking status: Never Smoker  . Smokeless tobacco: Never Used  Substance and Sexual Activity  . Alcohol use: Never    Frequency: Never  . Drug use: Never  . Sexual activity: Not Currently    Partners: Male  Lifestyle  . Physical activity    Days per week: 0 days    Minutes per session: 0 min  . Stress: Very much  Relationships  . Social connections    Talks on phone: More than three times a week    Gets together: More than three times a week    Attends religious service: Never    Active member of club or organization: No    Attends meetings of clubs or organizations: Never    Relationship status: Separated  . Intimate partner violence    Fear of current or ex partner: No    Emotionally abused: No    Physically abused: No    Forced sexual activity: No  Other Topics Concern  . Not on file  Social History Narrative  . Not on file    FAMILY HISTORY: Family History  Problem Relation Age of Onset  . Lung cancer Mother   . Hypertension Mother   . Hypertension Sister   . Hypertension Brother   . Bone cancer Maternal Aunt   . Colon cancer Maternal Uncle     ALLERGIES:  is allergic to ciprofloxacin; cyclobenzaprine; diazepam; ketorolac; mushroom extract complex; neomycin-polymyxin-gramicidin; ranitidine hcl; and ondansetron.  MEDICATIONS:  Current Outpatient Medications  Medication Sig Dispense Refill  . ADVAIR HFA 230-21 MCG/ACT inhaler Inhale 1 puff into the lungs 2 (two) times daily. 1 Inhaler 3  . albuterol (PROAIR HFA) 108 (90 Base) MCG/ACT inhaler Take 1 puff by mouth 2 (two) times daily as needed.    Marland Kitchen buPROPion (WELLBUTRIN) 100 MG tablet Take 100 mg by mouth daily.    . carbamazepine (TEGRETOL) 200 MG tablet Take 1 tablet by mouth 3 (three) times daily.    . cloNIDine (CATAPRES) 0.1 MG tablet Take 0.1 mg by mouth daily  as needed.     . clopidogrel (PLAVIX) 75 MG tablet Take 1 tablet (75 mg total) by mouth daily. 90 tablet 1  . Continuous Blood Gluc Receiver (FREESTYLE LIBRE 14 DAY READER) DEVI 1 Device by Does not apply route 4 (four) times daily. 1 Device 0  . Continuous Blood Gluc Sensor (FREESTYLE LIBRE 14 DAY SENSOR) MISC 1 Device by Does not apply route every 14 (fourteen) days. 6 each 1  . cyanocobalamin (,VITAMIN B-12,) 1000 MCG/ML injection Inject 1,000 mcg into the muscle once. Once a month    . docusate sodium (COLACE) 100 MG capsule Take 100 mg by mouth 2 (two) times daily.    . DULoxetine (CYMBALTA) 60 MG capsule Take 1 capsule by mouth daily.    Marland Kitchen EPINEPHrine (EPIPEN 2-PAK) 0.3 mg/0.3 mL IJ SOAJ injection Inject 0.3 mLs (0.3 mg total) into the muscle as needed  for anaphylaxis. Then call 911 for ER transport. 0.3 mL 1  . gabapentin (NEURONTIN) 300 MG capsule Take 2 capsules (600 mg total) by mouth at bedtime. 180 capsule 1  . hydrALAZINE (APRESOLINE) 50 MG tablet Take 1 tablet (50 mg total) by mouth 3 (three) times daily. 270 tablet 1  . Icosapent Ethyl 1 g CAPS Take 2 capsules (2 g total) by mouth 2 (two) times daily. 360 capsule 3  . Insulin Glargine (LANTUS SOLOSTAR) 100 UNIT/ML Solostar Pen Inject 80 Units into the skin daily. (Patient taking differently: Inject 85 Units into the skin daily. ) 10 pen 1  . lisinopril (ZESTRIL) 10 MG tablet     . loratadine (CLARITIN) 10 MG tablet Take 1 tablet (10 mg total) by mouth daily. 90 tablet 1  . LORazepam (ATIVAN) 1 MG tablet Take 1 tablet by mouth daily.    Marland Kitchen MAGNESIUM-OXIDE 400 (241.3 Mg) MG tablet Take 1 tablet (400 mg total) by mouth daily. 90 tablet 0  . meclizine (ANTIVERT) 25 MG tablet Take 1 tablet (25 mg total) by mouth 3 (three) times daily as needed for dizziness. 180 tablet 1  . methocarbamol (ROBAXIN) 750 MG tablet Take 1 tablet (750 mg total) by mouth 2 (two) times daily as needed for muscle spasms. 180 tablet 1  . mirabegron ER (MYRBETRIQ)  50 MG TB24 tablet Take 1 tablet (50 mg total) by mouth daily. 90 tablet 2  . montelukast (SINGULAIR) 10 MG tablet Take 1 tablet (10 mg total) by mouth daily. 90 tablet 0  . NIFEdipine (ADALAT CC) 60 MG 24 hr tablet Take 1 tablet (60 mg total) by mouth 2 (two) times daily. 180 tablet 1  . NOVOLOG FLEXPEN 100 UNIT/ML FlexPen Inject 14 Units into the skin 3 (three) times daily. With meals (Patient taking differently: Inject 14 Units into the skin 3 (three) times daily. With meals - sliding scale) 3 pen 3  . pantoprazole (PROTONIX) 40 MG tablet Take 1 tablet (40 mg total) by mouth daily. 90 tablet 1  . primidone (MYSOLINE) 250 MG tablet Take 200 mg by mouth at bedtime.     . promethazine (PHENERGAN) 25 MG tablet Take 1 tablet by mouth every 6 (six) hours as needed.     . propranolol (INDERAL) 10 MG tablet Take 1 tablet (10 mg total) by mouth 2 (two) times daily. 180 tablet 0  . rosuvastatin (CRESTOR) 20 MG tablet Take 1 tablet (20 mg total) by mouth daily. 90 tablet 3  . SURE COMFORT PEN NEEDLES 31G X 5 MM MISC Inject 1 pen as directed 5 (five) times daily as needed. 100 each 3  . traZODone (DESYREL) 50 MG tablet Take 1 tablet by mouth at bedtime.    Marland Kitchen trimethoprim (TRIMPEX) 100 MG tablet Take 1 tablet (100 mg total) by mouth daily. 90 tablet 1  . Vitamin D, Ergocalciferol, (DRISDOL) 1.25 MG (50000 UT) CAPS capsule Take 1 capsule (50,000 Units total) by mouth once a week. 12 capsule 1   No current facility-administered medications for this visit.      PHYSICAL EXAMINATION:  ECOG PERFORMANCE STATUS: 1 - Symptomatic but completely ambulatory Vitals:   04/26/19 1507  BP: (!) 164/83  Pulse: 82  Resp: 20  Temp: (!) 95.8 F (35.4 C)   Filed Weights   04/26/19 1507  Weight: 188 lb (85.3 kg)    Physical Exam Constitutional:      General: She is not in acute distress. HENT:     Head: Normocephalic  and atraumatic.  Eyes:     General: No scleral icterus.    Pupils: Pupils are equal, round,  and reactive to light.  Neck:     Musculoskeletal: Normal range of motion and neck supple.  Cardiovascular:     Rate and Rhythm: Normal rate and regular rhythm.     Heart sounds: Normal heart sounds.  Pulmonary:     Effort: Pulmonary effort is normal. No respiratory distress.     Breath sounds: No wheezing.  Abdominal:     General: Bowel sounds are normal. There is no distension.     Palpations: Abdomen is soft. There is no mass.     Tenderness: There is no abdominal tenderness.  Musculoskeletal: Normal range of motion.        General: No deformity.  Skin:    General: Skin is warm and dry.     Findings: No erythema or rash.  Neurological:     Mental Status: She is alert and oriented to person, place, and time.     Cranial Nerves: No cranial nerve deficit.     Coordination: Coordination normal.  Psychiatric:        Mood and Affect: Mood normal.     LABORATORY DATA:  I have reviewed the data as listed Lab Results  Component Value Date   WBC 7.8 04/26/2019   HGB 11.5 (L) 04/26/2019   HCT 33.5 (L) 04/26/2019   MCV 84.4 04/26/2019   PLT 257 04/26/2019   Recent Labs    12/22/18 1153 03/01/19 1140 04/14/19 0000 04/26/19 1443  NA 135 134* 133* 129*  K 4.1 4.5 4.4 4.9  CL 101 95* 98 98  CO2 25 24 26 23   GLUCOSE 112* 294* 165* 119*  BUN 17 14 20 20   CREATININE 0.99 1.06* 1.12* 1.11*  CALCIUM 8.9 9.6 9.2 8.9  GFRNONAA 59* 55* 51* 52*  GFRAA >60 63 59* 60*  PROT 8.1 7.5 7.1 7.8  ALBUMIN 4.2  --   --  4.2  AST 25 15 16 19   ALT 9 8 7 9   ALKPHOS 98  --   --  108  BILITOT 0.6 0.3 0.2 0.3   Iron/TIBC/Ferritin/ %Sat No results found for: IRON, TIBC, FERRITIN, IRONPCTSAT    RADIOGRAPHIC STUDIES: I have personally reviewed the radiological images as listed and agreed with the findings in the report. No results found.    ASSESSMENT & PLAN:  1. History of lymphoma   2. Depression, unspecified depression type   3. Hyponatremia    #History of nodular sclerosing  Hodgkin's lymphoma status post chemotherapy. Clinically she is doing well from lymphoma aspect.  No constitutional symptoms. Labs are reviewed and discussed with patient. Stable LDH and ESR, normal CBC.  Continue observation. She has remained in remission for 6 years.  CT images as indicated.  Depression, patient takes Wellbutrin, clonidine, Cymbalta.  Reports having a lot of stress in his life.  No thoughts of hurting herself.  #Hyponatremia, sodium level 129, she is asymptomatic.  Reviewing her previous labs, sodium levels chronically low.  Current sodium level slightly lower than her baseline.She reports drinking plenty of free water. Will repeat BMP in 2 weeks.  Orders Placed This Encounter  Procedures  . CBC with Differential/Platelet    Standing Status:   Future    Standing Expiration Date:   04/25/2020  . Comprehensive metabolic panel    Standing Status:   Future    Standing Expiration Date:   04/25/2020  . Lactate  dehydrogenase    Standing Status:   Future    Standing Expiration Date:   04/25/2020  . Sedimentation rate    Standing Status:   Future    Standing Expiration Date:   04/25/2020    All questions were answered. The patient knows to call the clinic with any problems questions or concerns.  cc Hubbard Hartshorn, FNP    Return of visit: 3 months.  Earlie Server, MD, PhD Hematology Oncology [facility] Pager- [phone] 04/27/2019

## 2019-04-29 ENCOUNTER — Encounter: Payer: Self-pay | Admitting: Family Medicine

## 2019-04-29 DIAGNOSIS — Z794 Long term (current) use of insulin: Secondary | ICD-10-CM

## 2019-04-29 DIAGNOSIS — E1165 Type 2 diabetes mellitus with hyperglycemia: Secondary | ICD-10-CM

## 2019-04-29 MED ORDER — LANTUS SOLOSTAR 100 UNIT/ML ~~LOC~~ SOPN: 80.0000 [IU] | PEN_INJECTOR | Freq: Every day | SUBCUTANEOUS | 3 refills | Status: DC

## 2019-04-30 ENCOUNTER — Encounter: Payer: Self-pay | Admitting: Cardiology

## 2019-04-30 ENCOUNTER — Ambulatory Visit (INDEPENDENT_AMBULATORY_CARE_PROVIDER_SITE_OTHER): Payer: Medicare Other | Admitting: Cardiology

## 2019-04-30 ENCOUNTER — Encounter: Payer: Self-pay | Admitting: Family Medicine

## 2019-04-30 ENCOUNTER — Other Ambulatory Visit: Payer: Self-pay

## 2019-04-30 VITALS — BP 150/80 | HR 77 | Ht 61.5 in | Wt 186.0 lb

## 2019-04-30 DIAGNOSIS — I639 Cerebral infarction, unspecified: Secondary | ICD-10-CM

## 2019-04-30 DIAGNOSIS — I10 Essential (primary) hypertension: Secondary | ICD-10-CM

## 2019-04-30 DIAGNOSIS — E7800 Pure hypercholesterolemia, unspecified: Secondary | ICD-10-CM

## 2019-04-30 DIAGNOSIS — Z01818 Encounter for other preprocedural examination: Secondary | ICD-10-CM | POA: Diagnosis not present

## 2019-04-30 DIAGNOSIS — I2510 Atherosclerotic heart disease of native coronary artery without angina pectoris: Secondary | ICD-10-CM

## 2019-04-30 NOTE — [place] Notes (Signed)
Cardiology Office Note:    Date:  04/30/2019   ID:  Victoria Austin, DOB 09/05/1950, MRN [id number]  PCP:  Hubbard Hartshorn, FNP  Cardiologist:  No primary care provider on file.  Electrophysiologist:  None   Referring MD: Hubbard Hartshorn, FNP   Chief Complaint  Patient presents with  . New Patient (Initial Visit)    Needs cardiac clearance. Patietn will not find out until Monday if she needs surgery. Meds reviewed verbally with patient.     History of Present Illness:   Victoria Austin is a 68 y.o. female with a hx of hypertension, diabetes, hyperlipidemia, CVA 2019, Hodgkin's lymphoma who presents for her presurgical evaluation prior to lumbar spinal surgery.  Patient has a history of vertigo and dizziness.  She had a fall earlier in the year around March injuring her back by fracturing her L1 vertebrae.  She is scheduled to see a spine surgeon next week for possible surgical intervention.  She denies any history of heart disease.  Denies chest pain or shortness of breath with exertion or at rest.  She has prior TIAs, and had a stroke last year in the fall.  She does not have any residual deficits.  She takes insulin for her diabetes.  She had back surgery earlier last year due to spinal stenosis and tolerated the procedure well.  This was before she had a stroke.  She has a history of Hodgkin's lymphoma status post chemotherapy 6 cycles finished in 2014.  Has stayed in remission since then.  Past Medical History:  Diagnosis Date  . Anxiety   . Arthritis   . Bipolar 1 disorder ([place]) 2006  . Depression   . Diabetes mellitus without complication ([place])   . Fibromyalgia   . GERD (gastroesophageal reflux disease)   . Hodgkin's lymphoma ([place])   . Hyperlipidemia   . IBS (irritable bowel syndrome)   . Incontinence   . RA (rheumatoid arthritis) ([place])   . Stroke [facility]) 2019    Past Surgical History:  Procedure Laterality Date  . BACK SURGERY    . CESAREAN SECTION    . ELBOW ARTHROSCOPY     . FINGER SURGERY    . HAND SURGERY    . HERNIA REPAIR    . SPINE SURGERY      Current Medications: Current Meds  Medication Sig  . ADVAIR HFA 230-21 MCG/ACT inhaler Inhale 1 puff into the lungs 2 (two) times daily.  Marland Kitchen albuterol (PROAIR HFA) 108 (90 Base) MCG/ACT inhaler Take 1 puff by mouth 2 (two) times daily as needed.  Marland Kitchen buPROPion (WELLBUTRIN) 100 MG tablet Take 100 mg by mouth daily.  . carbamazepine (TEGRETOL) 200 MG tablet Take 1 tablet by mouth 3 (three) times daily.  . cloNIDine (CATAPRES) 0.1 MG tablet Take 0.1 mg by mouth daily as needed.   . clopidogrel (PLAVIX) 75 MG tablet Take 1 tablet (75 mg total) by mouth daily.  . Continuous Blood Gluc Receiver (FREESTYLE LIBRE 14 DAY READER) DEVI 1 Device by Does not apply route 4 (four) times daily.  . Continuous Blood Gluc Sensor (FREESTYLE LIBRE 14 DAY SENSOR) MISC 1 Device by Does not apply route every 14 (fourteen) days.  . cyanocobalamin (,VITAMIN B-12,) 1000 MCG/ML injection Inject 1,000 mcg into the muscle once. Once a month  . docusate sodium (COLACE) 100 MG capsule Take 100 mg by mouth 2 (two) times daily.  . DULoxetine (CYMBALTA) 60 MG capsule Take 1 capsule by mouth daily.  Marland Kitchen EPINEPHrine (  EPIPEN 2-PAK) 0.3 mg/0.3 mL IJ SOAJ injection Inject 0.3 mLs (0.3 mg total) into the muscle as needed for anaphylaxis. Then call 911 for ER transport.  . gabapentin (NEURONTIN) 300 MG capsule Take 2 capsules (600 mg total) by mouth at bedtime.  . hydrALAZINE (APRESOLINE) 50 MG tablet Take 1 tablet (50 mg total) by mouth 3 (three) times daily.  Vanessa Kick Ethyl 1 g CAPS Take 2 capsules (2 g total) by mouth 2 (two) times daily.  . Insulin Glargine (LANTUS SOLOSTAR) 100 UNIT/ML Solostar Pen Inject 80 Units into the skin daily.  Marland Kitchen lisinopril (ZESTRIL) 10 MG tablet   . loratadine (CLARITIN) 10 MG tablet Take 1 tablet (10 mg total) by mouth daily.  Marland Kitchen LORazepam (ATIVAN) 1 MG tablet Take 1 tablet by mouth daily.  Marland Kitchen MAGNESIUM-OXIDE 400 (241.3 Mg)  MG tablet Take 1 tablet (400 mg total) by mouth daily.  . meclizine (ANTIVERT) 25 MG tablet Take 1 tablet (25 mg total) by mouth 3 (three) times daily as needed for dizziness.  . methocarbamol (ROBAXIN) 750 MG tablet Take 1 tablet (750 mg total) by mouth 2 (two) times daily as needed for muscle spasms.  . mirabegron ER (MYRBETRIQ) 50 MG TB24 tablet Take 1 tablet (50 mg total) by mouth daily.  . montelukast (SINGULAIR) 10 MG tablet Take 1 tablet (10 mg total) by mouth daily.  Marland Kitchen NIFEdipine (ADALAT CC) 60 MG 24 hr tablet Take 1 tablet (60 mg total) by mouth 2 (two) times daily.  Marland Kitchen NOVOLOG FLEXPEN 100 UNIT/ML FlexPen Inject 14 Units into the skin 3 (three) times daily. With meals (Patient taking differently: Inject 14 Units into the skin 3 (three) times daily. With meals - sliding scale)  . pantoprazole (PROTONIX) 40 MG tablet Take 1 tablet (40 mg total) by mouth daily.  . primidone (MYSOLINE) 250 MG tablet Take 200 mg by mouth at bedtime.   . promethazine (PHENERGAN) 25 MG tablet Take 1 tablet by mouth every 6 (six) hours as needed.   . propranolol (INDERAL) 10 MG tablet Take 1 tablet (10 mg total) by mouth 2 (two) times daily.  . rosuvastatin (CRESTOR) 20 MG tablet Take 1 tablet (20 mg total) by mouth daily.  . SURE COMFORT PEN NEEDLES 31G X 5 MM MISC Inject 1 pen as directed 5 (five) times daily as needed.  . traZODone (DESYREL) 50 MG tablet Take 1 tablet by mouth at bedtime.  Marland Kitchen trimethoprim (TRIMPEX) 100 MG tablet Take 1 tablet (100 mg total) by mouth daily.  . Vitamin D, Ergocalciferol, (DRISDOL) 1.25 MG (50000 UT) CAPS capsule Take 1 capsule (50,000 Units total) by mouth once a week.     Allergies:   Ciprofloxacin, Cyclobenzaprine, Diazepam, Ketorolac, Mushroom extract complex, Neomycin-polymyxin-gramicidin, Ranitidine hcl, and Ondansetron   Social History   Socioeconomic History  . Marital status: Legally Separated    Spouse name: Not on file  . Number of children: 2  . Years of  education: Not on file  . Highest education level: Not on file  Occupational History  . Occupation: retired  Scientific laboratory technician  . Financial resource strain: Not hard at all  . Food insecurity    Worry: Never true    Inability: Never true  . Transportation needs    Medical: No    Non-medical: No  Tobacco Use  . Smoking status: Never Smoker  . Smokeless tobacco: Never Used  Substance and Sexual Activity  . Alcohol use: Never    Frequency: Never  . Drug  use: Never  . Sexual activity: Not Currently    Partners: Male  Lifestyle  . Physical activity    Days per week: 0 days    Minutes per session: 0 min  . Stress: Very much  Relationships  . Social connections    Talks on phone: More than three times a week    Gets together: More than three times a week    Attends religious service: Never    Active member of club or organization: No    Attends meetings of clubs or organizations: Never    Relationship status: Separated  Other Topics Concern  . Not on file  Social History Narrative  . Not on file     Family History: The patient's family history includes Bone cancer in her maternal aunt; Colon cancer in her maternal uncle; Hypertension in her brother, mother, and sister; Lung cancer in her mother.  ROS:   Please see the history of present illness.     All other systems reviewed and are negative.  EKGs/Labs/Other Studies Reviewed:    The following studies were reviewed today:   EKG:  EKG is  ordered today.  The ekg ordered today demonstrates normal sinus rhythm, normal ECG.  Recent Labs: 03/01/2019: Magnesium 1.6; TSH 1.68 04/26/2019: ALT 9; BUN 20; Creatinine, Ser 1.11; Hemoglobin 11.5; Platelets 257; Potassium 4.9; Sodium 129  Recent Lipid Panel    Component Value Date/Time   CHOL 186 03/01/2019 1140   TRIG 413 (H) 03/01/2019 1140   HDL 39 (L) 03/01/2019 1140   CHOLHDL 4.8 03/01/2019 1140   LDLCALC  03/01/2019 1140     Comment:     . LDL cholesterol not calculated.  Triglyceride levels greater than 400 mg/dL invalidate calculated LDL results. . Reference range: <100 . Desirable range <100 mg/dL for primary prevention;   <70 mg/dL for patients with CHD or diabetic patients  with > or = 2 CHD risk factors. Marland Kitchen LDL-C is now calculated using the Martin-Hopkins  calculation, which is a validated novel method providing  better accuracy than the Friedewald equation in the  estimation of LDL-C.  Cresenciano Genre et al. Annamaria Helling. [id number]): 2061-2068  (http://education.[employer].com/faq/FAQ164)     Physical Exam:    VS:  BP (!) 150/80 (BP Location: Right Arm, Patient Position: Sitting, Cuff Size: Normal)   Pulse 77   Ht 5' 1.5" (1.562 m)   Wt 186 lb (84.4 kg)   BMI 34.58 kg/m     Wt Readings from Last 3 Encounters:  04/30/19 186 lb (84.4 kg)  04/26/19 188 lb (85.3 kg)  04/22/19 181 lb (82.1 kg)     GEN:  Well nourished, well developed in no acute distress HEENT: Normal NECK: No JVD; No carotid bruits LYMPHATICS: No lymphadenopathy CARDIAC: RRR, no murmurs, rubs, gallops RESPIRATORY:  Clear to auscultation without rales, wheezing or rhonchi  ABDOMEN: Soft, non-tender, non-distended MUSCULOSKELETAL:  No edema; No deformity  SKIN: Warm and dry NEUROLOGIC:  Alert and oriented x 3 PSYCHIATRIC:  Normal affect   ASSESSMENT:   The Revised Cardiac Risk Index indicates that her Perioperative Risk of Major Cardiac Event is (%): 11.  Therefore, she is at high risk for perioperative complications.    We will order an echocardiogram due to high risk and also prior chemotherapy use for Hodgkin's lymphoma treatment.  We will also get a pharmacologic stress test via Lexiscan myocardial perfusion imaging.  1. Pre-op evaluation   2. Essential hypertension   3. Pure hypercholesterolemia   4.  Cerebrovascular accident (CVA), unspecified mechanism ([place])    PLAN:    In order of problems listed above:  1. Order echocardiogram, Lexiscan myocardial perfusion  imaging. 2. Continue current BP meds 3. Continue Lipitor 4. On Plavix and Lipitor  Follow-up after echocardiogram and Lexiscan myocardial perfusion imaging.  Medication Adjustments/Labs and Tests Ordered: Current medicines are reviewed at length with the patient today.  Concerns regarding medicines are outlined above.  Orders Placed This Encounter  Procedures  . NM Myocar Multi W/Spect W/Wall Motion / EF  . EKG 12-Lead  . ECHOCARDIOGRAM COMPLETE   No orders of the defined types were placed in this encounter.   Patient Instructions  Medication Instructions:  Your physician recommends that you continue on your current medications as directed. Please refer to the Current Medication list given to you today.  If you need a refill on your cardiac medications before your next appointment, please call your pharmacy.   Lab work: None ordered  If you have labs (blood work) drawn today and your tests are completely normal, you will receive your results only by: Marland Kitchen MyChart Message (if you have MyChart) OR . A paper copy in the mail If you have any lab test that is abnormal or we need to change your treatment, we will call you to review the results.  Testing/Procedures: 1- Echo  Please return to [facility] on ______________ at _______________ AM/PM for an Echocardiogram. Your physician has requested that you have an echocardiogram. Echocardiography is a painless test that uses sound waves to create images of your heart. It provides your doctor with information about the size and shape of your heart and how well your heart's chambers and valves are working. This procedure takes approximately one hour. There are no restrictions for this procedure. Please note; depending on visual quality an IV may need to be placed.   2- [place]  Your caregiver has ordered a Stress Test with nuclear imaging. The purpose of this test is to evaluate the blood supply to your heart muscle. This  procedure is referred to as a "Non-Invasive Stress Test." This is because other than having an IV started in your vein, nothing is inserted or "invades" your body. Cardiac stress tests are done to find areas of poor blood flow to the heart by determining the extent of coronary artery disease (CAD). Some patients exercise on a treadmill, which naturally increases the blood flow to your heart, while others who are  unable to walk on a treadmill due to physical limitations have a pharmacologic/chemical stress agent called Lexiscan . This medicine will mimic walking on a treadmill by temporarily increasing your coronary blood flow.   Please note: these test may take anywhere between 2-4 hours to complete  PLEASE REPORT TO [place] AT THE FIRST DESK WILL DIRECT YOU WHERE TO GO  Date of Procedure:_____________________________________  Arrival Time for Procedure:______________________________  Instructions regarding medication:   __x__ : Hold diabetes medication morning of procedure (1/2 long acting insulin night prior, hold short acting the morning of)  __x__:  Hold betablocker(s) night before procedure and morning of procedure (propranolol)  _______________  PLEASE NOTIFY THE OFFICE AT LEAST 24 HOURS IN ADVANCE IF YOU ARE UNABLE TO KEEP YOUR APPOINTMENT.  [phone] AND  PLEASE NOTIFY NUCLEAR MEDICINE AT [facility] AT LEAST 24 HOURS IN ADVANCE IF YOU ARE UNABLE TO KEEP YOUR APPOINTMENT. [phone]  How to prepare for your Myoview test:  1. Do not eat  or drink after midnight 2. No caffeine for 24 hours prior to test 3. No smoking 24 hours prior to test. 4. Your medication may be taken with water.  If your doctor stopped a medication because of this test, do not take that medication. 5. Ladies, please do not wear dresses.  Skirts or pants are appropriate. Please wear a short sleeve shirt. 6. No perfume, cologne or lotion. 7. Wear comfortable walking shoes. No  heels!   Follow-Up: At [facility], you and your health needs are our priority.  As part of our continuing mission to provide you with exceptional heart care, we have created designated Provider Care Teams.  These Care Teams include your primary Cardiologist (physician) and Advanced Practice Providers (APPs -  Physician Assistants and Nurse Practitioners) who all work together to provide you with the care you need, when you need it. You will need a follow up appointment in 4-6 weeks. You may see Dr. Kate Sable or one of the following Advanced Practice Providers on your designated Care Team:   Murray Hodgkins, NP Christell Faith, PA-C . Marrianne Mood, PA-C     Signed, Kate Sable, MD  04/30/2019 5:08 PM    [place]

## 2019-04-30 NOTE — Patient Instructions (Addendum)
Medication Instructions:  Your physician recommends that you continue on your current medications as directed. Please refer to the Current Medication list given to you today.  If you need a refill on your cardiac medications before your next appointment, please call your pharmacy.   Lab work: None ordered  If you have labs (blood work) drawn today and your tests are completely normal, you will receive your results only by: Marland Kitchen MyChart Message (if you have MyChart) OR . A paper copy in the mail If you have any lab test that is abnormal or we need to change your treatment, we will call you to review the results.  Testing/Procedures: 1- Echo  Please return to [facility] on ______________ at _______________ AM/PM for an Echocardiogram. Your physician has requested that you have an echocardiogram. Echocardiography is a painless test that uses sound waves to create images of your heart. It provides your doctor with information about the size and shape of your heart and how well your heart's chambers and valves are working. This procedure takes approximately one hour. There are no restrictions for this procedure. Please note; depending on visual quality an IV may need to be placed.   2- [place]  Your caregiver has ordered a Stress Test with nuclear imaging. The purpose of this test is to evaluate the blood supply to your heart muscle. This procedure is referred to as a "Non-Invasive Stress Test." This is because other than having an IV started in your vein, nothing is inserted or "invades" your body. Cardiac stress tests are done to find areas of poor blood flow to the heart by determining the extent of coronary artery disease (CAD). Some patients exercise on a treadmill, which naturally increases the blood flow to your heart, while others who are  unable to walk on a treadmill due to physical limitations have a pharmacologic/chemical stress agent called Lexiscan . This medicine will  mimic walking on a treadmill by temporarily increasing your coronary blood flow.   Please note: these test may take anywhere between 2-4 hours to complete  PLEASE REPORT TO [place] AT THE FIRST DESK WILL DIRECT YOU WHERE TO GO  Date of Procedure:_____________________________________  Arrival Time for Procedure:______________________________  Instructions regarding medication:   __x__ : Hold diabetes medication morning of procedure (1/2 long acting insulin night prior, hold short acting the morning of)  __x__:  Hold betablocker(s) night before procedure and morning of procedure (propranolol)  _______________  PLEASE NOTIFY THE OFFICE AT LEAST 24 HOURS IN ADVANCE IF YOU ARE UNABLE TO KEEP YOUR APPOINTMENT.  [phone] AND  PLEASE NOTIFY NUCLEAR MEDICINE AT [facility] AT LEAST 24 HOURS IN ADVANCE IF YOU ARE UNABLE TO KEEP YOUR APPOINTMENT. [phone]  How to prepare for your Myoview test:  1. Do not eat or drink after midnight 2. No caffeine for 24 hours prior to test 3. No smoking 24 hours prior to test. 4. Your medication may be taken with water.  If your doctor stopped a medication because of this test, do not take that medication. 5. Ladies, please do not wear dresses.  Skirts or pants are appropriate. Please wear a short sleeve shirt. 6. No perfume, cologne or lotion. 7. Wear comfortable walking shoes. No heels!   Follow-Up: At [facility], you and your health needs are our priority.  As part of our continuing mission to provide you with exceptional heart care, we have created designated Provider Care Teams.  These Care Teams  include your primary Cardiologist (physician) and Advanced Practice Providers (APPs -  Physician Assistants and Nurse Practitioners) who all work together to provide you with the care you need, when you need it. You will need a follow up appointment in 4-6 weeks. You may see Dr. Kate Sable or one of the following  Advanced Practice Providers on your designated Care Team:   Murray Hodgkins, NP Christell Faith, PA-C . Marrianne Mood, PA-C

## 2019-05-03 ENCOUNTER — Ambulatory Visit: Payer: Medicare Other | Admitting: Urology

## 2019-05-03 DIAGNOSIS — M5135 Other intervertebral disc degeneration, thoracolumbar region: Secondary | ICD-10-CM | POA: Diagnosis not present

## 2019-05-03 DIAGNOSIS — M438X6 Other specified deforming dorsopathies, lumbar region: Secondary | ICD-10-CM | POA: Diagnosis not present

## 2019-05-03 DIAGNOSIS — M549 Dorsalgia, unspecified: Secondary | ICD-10-CM | POA: Diagnosis not present

## 2019-05-03 DIAGNOSIS — M5134 Other intervertebral disc degeneration, thoracic region: Secondary | ICD-10-CM | POA: Diagnosis not present

## 2019-05-03 DIAGNOSIS — S32011D Stable burst fracture of first lumbar vertebra, subsequent encounter for fracture with routine healing: Secondary | ICD-10-CM | POA: Diagnosis not present

## 2019-05-03 DIAGNOSIS — M4312 Spondylolisthesis, cervical region: Secondary | ICD-10-CM | POA: Diagnosis not present

## 2019-05-04 ENCOUNTER — Other Ambulatory Visit: Payer: Self-pay | Admitting: Neurological Surgery

## 2019-05-04 DIAGNOSIS — S32011D Stable burst fracture of first lumbar vertebra, subsequent encounter for fracture with routine healing: Secondary | ICD-10-CM

## 2019-05-05 ENCOUNTER — Ambulatory Visit: Payer: Medicare Other | Admitting: Orthotics

## 2019-05-05 ENCOUNTER — Other Ambulatory Visit: Payer: Self-pay

## 2019-05-05 DIAGNOSIS — M2040 Other hammer toe(s) (acquired), unspecified foot: Secondary | ICD-10-CM

## 2019-05-05 DIAGNOSIS — E1142 Type 2 diabetes mellitus with diabetic polyneuropathy: Secondary | ICD-10-CM

## 2019-05-05 DIAGNOSIS — M2010 Hallux valgus (acquired), unspecified foot: Secondary | ICD-10-CM

## 2019-05-05 NOTE — [place] Notes (Signed)

## 2019-05-10 ENCOUNTER — Encounter: Payer: Self-pay | Admitting: Family Medicine

## 2019-05-11 ENCOUNTER — Other Ambulatory Visit: Payer: Self-pay

## 2019-05-11 ENCOUNTER — Inpatient Hospital Stay: Payer: Medicare Other | Attending: Oncology

## 2019-05-11 DIAGNOSIS — Z79899 Other long term (current) drug therapy: Secondary | ICD-10-CM | POA: Diagnosis not present

## 2019-05-11 DIAGNOSIS — F411 Generalized anxiety disorder: Secondary | ICD-10-CM | POA: Diagnosis not present

## 2019-05-11 DIAGNOSIS — Z8571 Personal history of Hodgkin lymphoma: Secondary | ICD-10-CM | POA: Diagnosis not present

## 2019-05-11 DIAGNOSIS — E871 Hypo-osmolality and hyponatremia: Secondary | ICD-10-CM

## 2019-05-11 DIAGNOSIS — Z9221 Personal history of antineoplastic chemotherapy: Secondary | ICD-10-CM | POA: Insufficient documentation

## 2019-05-11 DIAGNOSIS — F3132 Bipolar disorder, current episode depressed, moderate: Secondary | ICD-10-CM | POA: Diagnosis not present

## 2019-05-11 LAB — BASIC METABOLIC PANEL
Anion gap: 10 (ref 5–15)
BUN: 22 mg/dL (ref 8–23)
CO2: 24 mmol/L (ref 22–32)
Calcium: 8.8 mg/dL — ABNORMAL LOW (ref 8.9–10.3)
Chloride: 97 mmol/L — ABNORMAL LOW (ref 98–111)
Creatinine, Ser: 0.97 mg/dL (ref 0.44–1.00)
GFR calc Af Amer: >60 mL/min (ref >60)
GFR calc non Af Amer: >60 mL/min (ref >60)
Glucose, Bld: 263 mg/dL — ABNORMAL HIGH (ref 70–99)
Potassium: 5 mmol/L (ref 3.5–5.1)
Sodium: 131 mmol/L — ABNORMAL LOW (ref 135–145)

## 2019-05-14 ENCOUNTER — Ambulatory Visit (INDEPENDENT_AMBULATORY_CARE_PROVIDER_SITE_OTHER): Payer: Medicare Other

## 2019-05-14 ENCOUNTER — Other Ambulatory Visit: Payer: Self-pay

## 2019-05-14 DIAGNOSIS — Z23 Encounter for immunization: Secondary | ICD-10-CM | POA: Diagnosis not present

## 2019-05-14 DIAGNOSIS — Z794 Long term (current) use of insulin: Secondary | ICD-10-CM

## 2019-05-14 DIAGNOSIS — E538 Deficiency of other specified B group vitamins: Secondary | ICD-10-CM

## 2019-05-14 DIAGNOSIS — E1165 Type 2 diabetes mellitus with hyperglycemia: Secondary | ICD-10-CM

## 2019-05-14 MED ORDER — CYANOCOBALAMIN 1000 MCG/ML IJ SOLN
1000.0000 ug | Freq: Once | INTRAMUSCULAR | Status: AC
  Administered 2019-05-14: 1000 ug via INTRAMUSCULAR

## 2019-05-14 NOTE — Addendum Note (Signed)
Addended by: Docia Furl on: 05/14/2019 12:08 PM   Modules accepted: Orders

## 2019-05-15 ENCOUNTER — Ambulatory Visit
Admission: RE | Admit: 2019-05-15 | Discharge: 2019-05-15 | Disposition: A | Discharge status: Home / Self Care | Payer: Medicare Other | Source: Ambulatory Visit | Attending: Neurological Surgery | Admitting: Neurological Surgery

## 2019-05-15 ENCOUNTER — Other Ambulatory Visit: Payer: Self-pay

## 2019-05-15 DIAGNOSIS — M5126 Other intervertebral disc displacement, lumbar region: Secondary | ICD-10-CM | POA: Diagnosis not present

## 2019-05-15 DIAGNOSIS — S32011D Stable burst fracture of first lumbar vertebra, subsequent encounter for fracture with routine healing: Secondary | ICD-10-CM | POA: Diagnosis not present

## 2019-05-15 MED ORDER — GADOBUTROL 1 MMOL/ML IV SOLN
9.0000 mL | Freq: Once | INTRAVENOUS | Status: AC | PRN
  Administered 2019-05-15: 9 mL via INTRAVENOUS

## 2019-05-17 ENCOUNTER — Encounter: Payer: Self-pay | Admitting: Podiatry

## 2019-05-17 ENCOUNTER — Other Ambulatory Visit: Payer: Self-pay

## 2019-05-17 ENCOUNTER — Ambulatory Visit (INDEPENDENT_AMBULATORY_CARE_PROVIDER_SITE_OTHER): Payer: Medicare Other | Admitting: Podiatry

## 2019-05-17 ENCOUNTER — Other Ambulatory Visit: Payer: Self-pay | Admitting: Podiatry

## 2019-05-17 ENCOUNTER — Ambulatory Visit (INDEPENDENT_AMBULATORY_CARE_PROVIDER_SITE_OTHER): Payer: Medicare Other

## 2019-05-17 ENCOUNTER — Ambulatory Visit (INDEPENDENT_AMBULATORY_CARE_PROVIDER_SITE_OTHER): Payer: Medicare Other | Admitting: Urology

## 2019-05-17 VITALS — BP 130/80 | HR 74 | Ht 63.0 in | Wt 180.0 lb

## 2019-05-17 DIAGNOSIS — N3281 Overactive bladder: Secondary | ICD-10-CM | POA: Diagnosis not present

## 2019-05-17 DIAGNOSIS — M722 Plantar fascial fibromatosis: Secondary | ICD-10-CM | POA: Diagnosis not present

## 2019-05-17 DIAGNOSIS — I2510 Atherosclerotic heart disease of native coronary artery without angina pectoris: Secondary | ICD-10-CM | POA: Diagnosis not present

## 2019-05-17 DIAGNOSIS — M778 Other enthesopathies, not elsewhere classified: Secondary | ICD-10-CM

## 2019-05-17 DIAGNOSIS — R351 Nocturia: Secondary | ICD-10-CM

## 2019-05-17 DIAGNOSIS — M779 Enthesopathy, unspecified: Secondary | ICD-10-CM | POA: Diagnosis not present

## 2019-05-17 LAB — BLADDER SCAN AMB NON-IMAGING: Scan Result: 0

## 2019-05-17 MED ORDER — TRIMETHOPRIM 100 MG PO TABS: 100.0000 mg | ORAL_TABLET | Freq: Every day | ORAL | 3 refills | Status: DC

## 2019-05-17 MED ORDER — MIRABEGRON ER 50 MG PO TB24: 50.0000 mg | ORAL_TABLET | Freq: Every day | ORAL | 3 refills | Status: DC

## 2019-05-17 NOTE — [place] Notes (Addendum)
   05/17/2019 3:57 PM   Victoria Austin 1951/06/03 [id number]  Reason for visit: Follow up urinary symptoms  HPI: I saw [name] back in urology clinic today for follow-up.  I originally saw her in July 2020 for a constellation of urinary symptoms including urinary urgency, incontinence, nocturia, and recurrent UTIs.  She has had a complex last few months with some complications from back surgery and remains in a brace.  She has been doing extremely well from a urology perspective.  At her last visit we had started Myrbetriq 50 mg daily, discussed behavioral strategies at length including decreasing her massive fluid intake during the day and in the evenings, encourage compliance with CPAP regarding her nocturia, and starting trimethoprim prophylaxis for UTIs.  She reports her urinary symptoms have all completely resolved.  She denies any UTIs since her last visit.  She denies any incontinence or significant urinary symptoms.  She will occasionally have some minimal nocturia that is not particularly bothersome, but often does not get up at all.  She denies any hematuria or any new complaints.  PVR 0 mL in clinic today.  We discussed that with everything going on with her back problems I recommended not making any changes to the regimen that is currently working for her.  She is in agreement.  Continue Myrbetriq 50 mg daily Continue CPAP for sleep apnea Continue trimethoprim 100 mg daily for UTI prevention RTC 1 year for symptom check, sooner if problems  A total of 15 minutes were spent face-to-face with the patient, greater than 50% was spent in patient education, counseling, and coordination of care regarding urinary symptoms and comorbidities.  Billey Co, [facility] [address], [place], [place] [address] [phone]

## 2019-05-17 NOTE — [place] Notes (Signed)
This patient presents the office for continued evaluation of her diabetic feet.  She was seen 3 months ago and was told to see Liliane Channel to receive her diabetic shoes.  She says she is waiting for a phone call from [place] to pick up her diabetic shoes.  She also presents the office to discuss nail surgery for the removal of a nail spicule.  Patient is not having any symptoms related to the nail spicule right foot.  Patient states that she is having pain in her left foot which she is relates to previous foot fracture in her left foot.  She states that this area becomes swollen after activity and painful.  She says this is started to happen since her last visit.  She states that she is experiencing pain in her second toe left foot which is related to her activity.  She presents the office today for further evaluation and treatment of her diabetic feet.    Vascular  Dorsalis pedis and posterior tibial pulses are palpable  B/L.  Capillary return  WNL.  Temperature gradient is  WNL.  Skin turgor  WNL  Sensorium  Senn Weinstein monofilament wire  WNL. Normal tactile sensation.  Nail Exam  Patient has normal nails with no evidence of bacterial or fungal infection.  Orthopedic  Exam  Muscle tone and muscle strength  WNL.  No limitations of motion feet  B/L.  No crepitus or joint effusion noted.  HAV 1st MPJ  B/L.  Hammer toes second  B/L  Palpable pain elicited upon palpation 2nd  MPJ  Left foot.    Skin  No open lesions.  Normal skin texture and turgor.  Capsulitis 2nd  MPJ  Left foot.  IE.  X-rays taken reveal significant osteopenia.  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  No evidence of any bony pathology in her left forefoot.  Cartilaginous changes noted at the head of the first metatarsal left foot.  Discussed this condition with this patient.  Told her there is no evidence of significant bony fracture noted in her left foot per her description.  No evidence of any clinical symptoms  noted.  Patient does have capsulitis second MPJ which can be related to the poor functioning big toe joint left foot.  Gave this patient a prescription for acetaminophen 325 to be obtained at [facility].  Patient does not want to consider injection therapy at this time.  Patient's says she is waiting for her diabetic shoes before she has further treatment on her painful left foot.  RTC prn.   Gardiner Barefoot DPM

## 2019-05-20 ENCOUNTER — Other Ambulatory Visit: Payer: Self-pay

## 2019-05-20 ENCOUNTER — Ambulatory Visit (INDEPENDENT_AMBULATORY_CARE_PROVIDER_SITE_OTHER): Payer: Medicare Other | Admitting: Family Medicine

## 2019-05-20 DIAGNOSIS — M779 Enthesopathy, unspecified: Secondary | ICD-10-CM | POA: Insufficient documentation

## 2019-05-21 NOTE — [place] Notes (Signed)
Not seen

## 2019-05-24 DIAGNOSIS — F3132 Bipolar disorder, current episode depressed, moderate: Secondary | ICD-10-CM | POA: Diagnosis not present

## 2019-05-24 DIAGNOSIS — F411 Generalized anxiety disorder: Secondary | ICD-10-CM | POA: Diagnosis not present

## 2019-05-25 ENCOUNTER — Ambulatory Visit
Admission: RE | Admit: 2019-05-25 | Discharge: 2019-05-25 | Disposition: A | Discharge status: Home / Self Care | Payer: Medicare Other | Source: Ambulatory Visit | Attending: Cardiology | Admitting: Cardiology

## 2019-05-25 ENCOUNTER — Other Ambulatory Visit: Payer: Self-pay

## 2019-05-25 DIAGNOSIS — I2510 Atherosclerotic heart disease of native coronary artery without angina pectoris: Secondary | ICD-10-CM | POA: Diagnosis not present

## 2019-05-25 DIAGNOSIS — R911 Solitary pulmonary nodule: Secondary | ICD-10-CM | POA: Diagnosis not present

## 2019-05-25 DIAGNOSIS — F3132 Bipolar disorder, current episode depressed, moderate: Secondary | ICD-10-CM | POA: Diagnosis not present

## 2019-05-25 DIAGNOSIS — E7800 Pure hypercholesterolemia, unspecified: Secondary | ICD-10-CM | POA: Insufficient documentation

## 2019-05-25 DIAGNOSIS — Z01810 Encounter for preprocedural cardiovascular examination: Secondary | ICD-10-CM

## 2019-05-25 DIAGNOSIS — I700 Atherosclerosis of aorta: Secondary | ICD-10-CM | POA: Insufficient documentation

## 2019-05-25 DIAGNOSIS — F411 Generalized anxiety disorder: Secondary | ICD-10-CM | POA: Diagnosis not present

## 2019-05-25 LAB — NM MYOCAR MULTI W/SPECT W/WALL MOTION / EF
LV dias vol: 89 mL (ref 46–106)
LV sys vol: 39 mL
MPHR: 154 {beats}/min
Peak HR: 90 {beats}/min
Percent HR: 58 %
Rest HR: 79 {beats}/min
TID: 0.94

## 2019-05-25 MED ORDER — TECHNETIUM TC 99M TETROFOSMIN IV KIT
10.0000 | PACK | Freq: Once | INTRAVENOUS | Status: AC | PRN
  Administered 2019-05-25: 9.78 via INTRAVENOUS

## 2019-05-25 MED ORDER — REGADENOSON 0.4 MG/5ML IV SOLN
0.4000 mg | Freq: Once | INTRAVENOUS | Status: AC
  Administered 2019-05-25: 0.4 mg via INTRAVENOUS

## 2019-05-25 MED ORDER — TECHNETIUM TC 99M TETROFOSMIN IV KIT
31.9400 | PACK | Freq: Once | INTRAVENOUS | Status: AC | PRN
  Administered 2019-05-25: 31.94 via INTRAVENOUS

## 2019-06-02 ENCOUNTER — Other Ambulatory Visit: Payer: Self-pay | Admitting: Cardiology

## 2019-06-02 DIAGNOSIS — Z9221 Personal history of antineoplastic chemotherapy: Secondary | ICD-10-CM

## 2019-06-02 DIAGNOSIS — Z8673 Personal history of transient ischemic attack (TIA), and cerebral infarction without residual deficits: Secondary | ICD-10-CM

## 2019-06-02 DIAGNOSIS — Z01818 Encounter for other preprocedural examination: Secondary | ICD-10-CM

## 2019-06-07 DIAGNOSIS — E1122 Type 2 diabetes mellitus with diabetic chronic kidney disease: Secondary | ICD-10-CM | POA: Insufficient documentation

## 2019-06-07 DIAGNOSIS — R801 Persistent proteinuria, unspecified: Secondary | ICD-10-CM | POA: Diagnosis not present

## 2019-06-07 DIAGNOSIS — E871 Hypo-osmolality and hyponatremia: Secondary | ICD-10-CM | POA: Diagnosis not present

## 2019-06-08 ENCOUNTER — Other Ambulatory Visit: Payer: Self-pay

## 2019-06-08 ENCOUNTER — Encounter: Payer: Self-pay | Admitting: Student in an Organized Health Care Education/Training Program

## 2019-06-08 ENCOUNTER — Ambulatory Visit
Payer: Medicare Other | Attending: Student in an Organized Health Care Education/Training Program | Admitting: Student in an Organized Health Care Education/Training Program

## 2019-06-08 VITALS — BP 153/87 | HR 69 | Temp 97.9°F | Ht 64.0 in | Wt 189.0 lb

## 2019-06-08 DIAGNOSIS — S32010D Wedge compression fracture of first lumbar vertebra, subsequent encounter for fracture with routine healing: Secondary | ICD-10-CM | POA: Insufficient documentation

## 2019-06-08 DIAGNOSIS — M797 Fibromyalgia: Secondary | ICD-10-CM | POA: Diagnosis not present

## 2019-06-08 DIAGNOSIS — M5136 Other intervertebral disc degeneration, lumbar region: Secondary | ICD-10-CM

## 2019-06-08 DIAGNOSIS — E1142 Type 2 diabetes mellitus with diabetic polyneuropathy: Secondary | ICD-10-CM | POA: Diagnosis not present

## 2019-06-08 DIAGNOSIS — M47816 Spondylosis without myelopathy or radiculopathy, lumbar region: Secondary | ICD-10-CM | POA: Diagnosis not present

## 2019-06-08 DIAGNOSIS — I639 Cerebral infarction, unspecified: Secondary | ICD-10-CM | POA: Insufficient documentation

## 2019-06-08 DIAGNOSIS — F319 Bipolar disorder, unspecified: Secondary | ICD-10-CM | POA: Diagnosis not present

## 2019-06-08 DIAGNOSIS — G894 Chronic pain syndrome: Secondary | ICD-10-CM | POA: Insufficient documentation

## 2019-06-08 DIAGNOSIS — R42 Dizziness and giddiness: Secondary | ICD-10-CM | POA: Insufficient documentation

## 2019-06-08 NOTE — [place] Notes (Signed)
Safety precautions to be maintained throughout the outpatient stay will include: orient to surroundings, keep bed in low position, maintain call bell within reach at all times, provide assistance with transfer out of bed and ambulation.  

## 2019-06-08 NOTE — Progress Notes (Signed)
Patient's Name: Victoria Austin  MRN: [id number]  Referring Provider: Hubbard Hartshorn, FNP  DOB: October 31, 1950  PCP: Victoria Hartshorn, FNP  DOS: 06/08/2019  Note by: Gillis Santa, MD  Service setting: Ambulatory outpatient  Specialty: Interventional Pain Management  Location: [facility] (AMB) Pain Management Facility  Visit type: Initial Patient Evaluation  Patient type: New Patient   Primary Reason(s) for Visit: Encounter for initial evaluation of one or more chronic problems (new to examiner) potentially causing chronic pain, and posing a threat to normal musculoskeletal function. (Level of risk: High) CC: Back Pain  HPI  Victoria Austin is a 68 y.o. year old, female patient, who comes today to see [place] for the first time for an initial evaluation of her chronic pain. 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 Today she comes in for evaluation of her Back Pain  Pain Assessment: Location: Right, Left(Back and leg) Back Radiating: Pain radiaties down back to both legs, foot become numb at times Onset: More than a month ago Duration: Chronic pain Quality: Aching, Throbbing, Pins and needles, Tingling, Sharp, Shooting Severity: 0-No pain(only while sitting)/10 (subjective, self-reported pain score)  Note: Reported level is  compatible with observation.                         When using our objective Pain Scale, levels between 6 and 10/10 are said to belong in an emergency room, as it progressively worsens from a 6/10, described as severely limiting, requiring emergency care not usually available at an outpatient pain management facility. At a 6/10 level, communication becomes difficult and requires great effort. Assistance to reach the emergency department may be required. Facial flushing and profuse sweating along with potentially dangerous increases in heart rate and blood pressure will be evident. Effect on ADL: unable to do anything while in pain Timing: Intermittent Modifying factors: No pain while sitting, nothing helps while walking BP: (!) 153/87  HR: 69  Onset and Duration: Sudden Cause of pain: fall in march 2020 Severity: Getting worse, NAS-11 at its worse: 10/10, NAS-11 at its best: 0/10, NAS-11 now: 0/10 and NAS-11 on the average: 0/10 Timing: Night Aggravating Factors: Bending, Climbing, Lifiting, Prolonged standing, Twisting and Walking Alleviating Factors: Hot packs, Resting, Sitting and Warm showers or baths Associated Problems: Depression, Fatigue, Spasms, Weakness, Pain that wakes patient up and Pain that does not allow patient to sleep Quality of Pain: Aching, Pressure-like, Sharp, Shooting and Throbbing Previous Examinations or Tests: Bone scan, CT scan, MRI scan, X-rays and Nerve conduction test Previous Treatments: Narcotic medications and Physical Therapy  The patient comes into the clinics today for the first time for a chronic pain management evaluation.   Patient is a pleasant 68 year old female with complex medical history who presents with a chief complaint of severe low back pain and leg pain with weightbearing ONLY.  Of note patient sustained a fall in March 2020.  This was on [facility].  She had to be transported to the [facility] urgently  at that time.  She was diagnosed  with a L1 compression fracture.  Even though her MRI reveals healing of her compression fracture, she continues to have persistent and severe pain.  She has been evaluated by [facility] neurosurgery and she is scheduled to have a bone scan next week to discuss surgical options.  She has been told that she needs extensive lumbar spine surgery with interbody cage for her compression fracture.  She is seeking a second opinion with Dr. Cari Caraway in the upcoming weeks.  She is being referred here for consideration of opioid therapy to help manage her pain.  She is significantly limited in her ambulation ability and requires significant help with ADLs.  Her grandson lives with her and provides support.  She ambulates very slowly with a cane.  She also has a thoracic brace in place.  She has a history of stroke in 2019 and is currently on Plavix.  She also has a history of type 2 diabetes on insulin.  We will focus primarily on medication management as the patient is high risk to be off of her Plavix for elective injections and she also has a history of type 2 diabetes on insulin so would like to avoid steroid therapy if at all possible.  She does have a history of bipolar and depression.  She is also on Cymbalta 60 mg daily, gabapentin 600 mg nightly along with magnesium 400 mg daily.  She did find benefit with oxycodone 5 mg previously. Historic Controlled Substance Pharmacotherapy Review  Historical Background Evaluation: [place] PMP: PDMP reviewed during this encounter. Six (6) year initial data search conducted.              Department of public safety, offender search: Editor, commissioning Information) Non-contributory Risk Assessment Profile: Aberrant behavior: None observed or detected today Risk factors for fatal opioid overdose: None identified today Fatal overdose hazard ratio (HR): Calculation deferred Non-fatal overdose hazard ratio (HR): Calculation deferred Risk of opioid abuse or dependence: 0.7-3.0% with  doses ? 36 MME/day and 6.1-26% with doses ? 120 MME/day. Substance use disorder (SUD) risk level: Pending results of Medical Psychology Evaluation for SUD  Pharmacologic Plan: As per protocol, I have not taken over any controlled substance management, pending the results of ordered tests and/or consults.            Initial impression: Pending review of available data and ordered tests.  Meds   Current Outpatient Medications:  .  ADVAIR HFA 975-88 MCG/ACT inhaler, Inhale 1 puff into the lungs 2 (two) times daily., Disp: 1 Inhaler, Rfl: 3 .  albuterol (PROAIR HFA) 108 (90 Base) MCG/ACT inhaler, Take 1 puff by mouth 2 (two) times daily as needed., Disp: , Rfl:  .  buPROPion (WELLBUTRIN) 100 MG tablet, Take 100 mg by mouth daily., Disp: , Rfl:  .  carbamazepine (TEGRETOL) 200 MG tablet, Take 1 tablet by mouth 3 (three) times daily., Disp: , Rfl:  .  cloNIDine (CATAPRES) 0.1 MG tablet, Take 0.1 mg by mouth daily as needed. , Disp: , Rfl:  .  clopidogrel (PLAVIX) 75 MG tablet, Take 1 tablet (75 mg total) by mouth daily., Disp: 90 tablet, Rfl: 1 .  Continuous Blood Gluc Receiver (FREESTYLE LIBRE 14 DAY READER) DEVI, 1 Device by Does not apply route 4 (four) times daily., Disp: 1 Device, Rfl: 0 .  Continuous Blood Gluc Sensor (FREESTYLE LIBRE 14 DAY SENSOR) MISC, 1 Device by Does not apply route every 14 (fourteen) days., Disp: 6 each, Rfl:  1 .  cyanocobalamin (,VITAMIN B-12,) 1000 MCG/ML injection, Inject 1,000 mcg into the muscle once. Once a month, Disp: , Rfl:  .  docusate sodium (COLACE) 100 MG capsule, Take 100 mg by mouth 2 (two) times daily., Disp: , Rfl:  .  DULoxetine (CYMBALTA) 60 MG capsule, Take 1 capsule by mouth daily., Disp: , Rfl:  .  EPINEPHrine (EPIPEN 2-PAK) 0.3 mg/0.3 mL IJ SOAJ injection, Inject 0.3 mLs (0.3 mg total) into the muscle as needed for anaphylaxis. Then call 911 for ER transport., Disp: 0.3 mL, Rfl: 1 .  gabapentin (NEURONTIN) 300 MG capsule, Take 2 capsules (600 mg  total) by mouth at bedtime., Disp: 180 capsule, Rfl: 1 .  hydrALAZINE (APRESOLINE) 50 MG tablet, Take 1 tablet (50 mg total) by mouth 3 (three) times daily., Disp: 270 tablet, Rfl: 1 .  Icosapent Ethyl 1 g CAPS, Take 2 capsules (2 g total) by mouth 2 (two) times daily., Disp: 360 capsule, Rfl: 3 .  Insulin Glargine (LANTUS SOLOSTAR) 100 UNIT/ML Solostar Pen, Inject 80 Units into the skin daily., Disp: 10 pen, Rfl: 3 .  lisinopril (ZESTRIL) 10 MG tablet, , Disp: , Rfl:  .  loratadine (CLARITIN) 10 MG tablet, Take 1 tablet (10 mg total) by mouth daily., Disp: 90 tablet, Rfl: 1 .  LORazepam (ATIVAN) 1 MG tablet, Take 1 tablet by mouth daily., Disp: , Rfl:  .  MAGNESIUM-OXIDE 400 (241.3 Mg) MG tablet, Take 1 tablet (400 mg total) by mouth daily., Disp: 90 tablet, Rfl: 0 .  meclizine (ANTIVERT) 25 MG tablet, Take 1 tablet (25 mg total) by mouth 3 (three) times daily as needed for dizziness., Disp: 180 tablet, Rfl: 1 .  methocarbamol (ROBAXIN) 750 MG tablet, Take 1 tablet (750 mg total) by mouth 2 (two) times daily as needed for muscle spasms., Disp: 180 tablet, Rfl: 1 .  mirabegron ER (MYRBETRIQ) 50 MG TB24 tablet, Take 1 tablet (50 mg total) by mouth daily., Disp: 90 tablet, Rfl: 3 .  montelukast (SINGULAIR) 10 MG tablet, Take 1 tablet (10 mg total) by mouth daily., Disp: 90 tablet, Rfl: 0 .  NIFEdipine (ADALAT CC) 60 MG 24 hr tablet, Take 1 tablet (60 mg total) by mouth 2 (two) times daily., Disp: 180 tablet, Rfl: 1 .  NOVOLOG FLEXPEN 100 UNIT/ML FlexPen, Inject 14 Units into the skin 3 (three) times daily. With meals (Patient taking differently: Inject 14 Units into the skin 3 (three) times daily. With meals - sliding scale), Disp: 3 pen, Rfl: 3 .  pantoprazole (PROTONIX) 40 MG tablet, Take 1 tablet (40 mg total) by mouth daily., Disp: 90 tablet, Rfl: 1 .  primidone (MYSOLINE) 250 MG tablet, Take 200 mg by mouth at bedtime. , Disp: , Rfl:  .  promethazine (PHENERGAN) 25 MG tablet, Take 1 tablet by  mouth every 6 (six) hours as needed. , Disp: , Rfl:  .  propranolol (INDERAL) 10 MG tablet, Take 1 tablet (10 mg total) by mouth 2 (two) times daily., Disp: 180 tablet, Rfl: 0 .  rosuvastatin (CRESTOR) 20 MG tablet, Take 1 tablet (20 mg total) by mouth daily., Disp: 90 tablet, Rfl: 3 .  SURE COMFORT PEN NEEDLES 31G X 5 MM MISC, Inject 1 pen as directed 5 (five) times daily as needed., Disp: 100 each, Rfl: 3 .  traZODone (DESYREL) 50 MG tablet, Take 1 tablet by mouth at bedtime., Disp: , Rfl:  .  trimethoprim (TRIMPEX) 100 MG tablet, Take 1 tablet (100 mg total) by mouth  daily., Disp: 90 tablet, Rfl: 3 .  Vitamin D, Ergocalciferol, (DRISDOL) 1.25 MG (50000 UT) CAPS capsule, Take 1 capsule (50,000 Units total) by mouth once a week., Disp: 12 capsule, Rfl: 1  Imaging Review  Cervical Imaging:  Results for orders placed during the hospital encounter of 12/29/18  CT Cervical Spine Wo Contrast   Narrative CLINICAL DATA:  Posttraumatic headache and neck pain after fall.  EXAM: CT HEAD WITHOUT CONTRAST  CT CERVICAL SPINE WITHOUT CONTRAST  TECHNIQUE: Multidetector CT imaging of the head and cervical spine was performed following the standard protocol without intravenous contrast. Multiplanar CT image reconstructions of the cervical spine were also generated.  COMPARISON:  None.  FINDINGS: CT HEAD FINDINGS  Brain: Mild chronic ischemic white matter disease is noted. No mass effect or midline shift is noted. Ventricular size is within normal limits. There is no evidence of mass lesion, hemorrhage or acute infarction.  Vascular: No hyperdense vessel or unexpected calcification.  Skull: Normal. Negative for fracture or focal lesion.  Sinuses/Orbits: No acute finding.  Other: None.  CT CERVICAL SPINE FINDINGS  Alignment: Normal.  Skull base and vertebrae: No acute fracture. No primary bone lesion or focal pathologic process.  Soft tissues and spinal canal: No prevertebral fluid  or swelling. No visible canal hematoma.  Disc levels: Moderate degenerative disc disease is noted at C6-7 and C7-T1 with anterior osteophyte formation.  Upper chest: Negative.  Other: Degenerative changes are seen involving posterior facet joints bilaterally.  IMPRESSION: Mild chronic ischemic white matter disease. No acute intracranial abnormality seen.  Moderate multilevel degenerative disc disease. No acute abnormality seen in the cervical spine.   Electronically Signed   By: Marijo Conception M.D.   On: 12/29/2018 20:52    Shoulder-R DG:  Results for orders placed in visit on 09/10/02  DG Shoulder Right   Narrative FINDINGS CLINICAL DATA:  RIGHT SHOULDER PAIN. RIGHT SHOULDER: TWO VIEW EXAM OF THE RIGHT SHOULDER WAS OBTAINED. NO EVIDENCE FOR ACUTE FRACTURE, DISLOCATION OR SEPARATION.  THERE IS DEGENERATIVE CHANGE AT THE ACROMIOCLAVICULAR JOINT WITH A PROMINENT INFERIOR SURFACE SPUR FROM THE DISTAL CLAVICLE. IMPRESSION ACROMIOCLAVICULAR JOINT OSTEOARTHRITIS.  OTHERWISE, UNREMARKABLE STUDY.   Results for orders placed during the hospital encounter of 12/29/18  DG Thoracic Spine 2 View   Narrative CLINICAL DATA:  [place] from a chair.  Back pain.  EXAM: THORACIC SPINE 2 VIEWS  COMPARISON:  Radiographs 12/09/2018  FINDINGS: Normal alignment of the thoracic vertebral bodies.  Moderate stable degenerative changes but no definite acute fracture. The visualized posterior ribs are intact. Stable L1 compression fracture noted.  IMPRESSION: Normal alignment and no acute bony findings. Stable degenerative changes.   Electronically Signed   By: Marijo Sanes M.D.   On: 12/29/2018 20:49     Lumbosacral Imaging: Lumbar MR wo contrast:  Results for orders placed during the hospital encounter of 01/15/19  MR LUMBAR SPINE WO CONTRAST   Narrative CLINICAL DATA:  68 y/o F; history fall with L1 fracture. Also with recent fall with right-sided lower back  pain.  EXAM: MRI LUMBAR SPINE WITHOUT CONTRAST  TECHNIQUE: Multiplanar, multisequence MR imaging of the lumbar spine was performed. No intravenous contrast was administered.  COMPARISON:  12/29/2018 lumbar spine radiographs.  FINDINGS: Segmentation:  Standard.  Alignment:  Physiologic.  Vertebrae: No no acute fracture, evidence of discitis, or bone lesion. Moderate L1 anterior compression deformity. There is mild edema within the L1 vertebral body.  Conus medullaris and cauda equina: Conus extends to the L1  level. Conus and cauda equina appear normal.  Paraspinal and other soft tissues: Negative.  Disc levels:  T12-L1: 6 mm retropulsion of L1 superior endplate effaces the ventral thecal sac. No significant foraminal stenosis. Mild spinal canal stenosis.  L1-2: Mild disc bulge. No significant foraminal or spinal canal stenosis.  L2-3: Mild disc bulge with facet and ligamentum flavum hypertrophy. No significant foraminal stenosis. Mild spinal canal stenosis.  L3-4: Disc bulge with small central protrusion, endplate marginal osteophytes in the foraminal and extraforaminal zones, facet hypertrophy, and ligamentum flavum hypertrophy. Moderate left and mild right neural foraminal stenosis. Partial effacement of left lateral recess. Mild spinal canal stenosis.  L4-5: Mild disc bulge and moderate facet hypertrophy. Mild bilateral neural foraminal stenosis. No significant spinal canal stenosis.  L5-S1: Small central protrusion. Right greater than left facet hypertrophy. Moderate right and mild left neural foraminal stenosis. No significant spinal canal stenosis.  IMPRESSION: 1. L1 anterior compression deformity with stable loss of height. Mild persistent edema within the vertebral body. No new acute fracture. 2. Lumbar spondylosis greatest at the L3-4 level were disc and facet degenerative changes result in mild spinal canal stenosis as well as moderate left and mild  right neural foraminal stenosis. No high-grade spinal canal stenosis.   Electronically Signed   By: Kristine Garbe M.D.   On: 01/16/2019 03:56    Lumbar MR w/wo contrast:  Results for orders placed during the hospital encounter of 05/15/19  MR Lumbar Spine W Wo Contrast   Narrative CLINICAL DATA:  [place] in March of this year with fracture. Continued back pain.  EXAM: MRI LUMBAR SPINE WITHOUT AND WITH CONTRAST  TECHNIQUE: Multiplanar and multiecho pulse sequences of the lumbar spine were obtained without and with intravenous contrast.  CONTRAST:  42m GADAVIST GADOBUTROL 1 MMOL/ML IV SOLN  COMPARISON:  01/15/2019  FINDINGS: Segmentation: 5 lumbar type vertebral bodies as numbered previously.  Alignment: Mild kyphotic curvature at L1 because of the fracture. No other malalignment.  Vertebrae: Old compression fracture at L1 which has healed. Loss of height anteriorly of 60%. Mild posterior bowing of the posterosuperior margin of the vertebral body but no compressive encroachment upon the canal or foramina. No other regional fracture or focal bone lesion. Incidental hemangioma within the T10 vertebral body.  Conus medullaris and cauda equina: Conus extends to the L1 level. Conus and cauda equina appear normal.  Paraspinal and other soft tissues: Negative  Disc levels:  Minimal non-compressive disc bulges at T12-L1 and L1-2.  L2-3: Mild disc bulge. Mild facet hypertrophy. No compressive stenosis.  L3-4: Moderate disc bulge. Mild narrowing of both lateral recesses without distinct neural compression.  L4-5: Mild disc bulge. Mild facet degeneration and hypertrophy. No compressive stenosis.  L5-S1: Mild disc bulge. Bilateral facet osteoarthritis. No compressive stenosis.  IMPRESSION: Old compression fracture at L1 with loss of height anteriorly of 60%. Mild posterior bowing of the posterior margin of the vertebral body but no compressive stenosis.  Fracture is essentially completely healed, without any measurable marrow edema. There is minimal enhancement along the fracture margins which can persist for quite some time.  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   Electronically Signed   By: MNelson ChimesM.D.   On: 05/15/2019 13:10     Lumbar DG 2-3 views:  Results for orders placed during the hospital encounter of 12/29/18  DG Lumbar Spine 2-3 Views   Narrative CLINICAL DATA:  [place]  Back pain.  EXAM: LUMBAR SPINE - 2-3 VIEW  COMPARISON:  Radiographs 12/09/2018  FINDINGS: L1 compression fractures again demonstrated with mild retropulsion of the posterosuperior aspect of the vertebral body. No acute lumbar compression fracture is identified.  The facets are normally aligned. Moderate facet disease but no pars defects. Stable vascular calcifications.  IMPRESSION: Stable L1 compression fracture. No definite new compression fractures.   Electronically Signed   By: Marijo Sanes M.D.   On: 12/29/2018 20:51    Wrist-L DG Complete:  Results for orders placed during the hospital encounter of 12/29/18  DG Wrist Complete Left   Narrative CLINICAL DATA:  [place].  Left wrist pain.  EXAM: LEFT WRIST - COMPLETE 3+ VIEW  COMPARISON:  None.  FINDINGS: No acute fractures identified. There is widening of the scapholunate joint space which could suggest a ligament injury. Mild degenerative changes involving the wrist joint and carpometacarpal joint of the thumb.  IMPRESSION: 1. No acute fracture. 2. Widened scapholunate joint space could suggest a ligament injury.   Electronically Signed   By: Marijo Sanes M.D.   On: 12/29/2018 20:54     Hand Imaging: Hand-R DG Complete: No results found for this or any previous visit. Hand-L DG Complete:  Results for orders placed during the hospital encounter of 12/29/18  DG  Hand Complete Left   Narrative CLINICAL DATA:  [place].  Left hand pain.  EXAM: LEFT HAND - COMPLETE 3+ VIEW  COMPARISON:  None.  FINDINGS: The joint spaces are fairly well maintained. There are mild degenerative changes mainly at the DIP and PIP joints of the fingers. No acute fractures identified.  IMPRESSION: Degenerative changes but no acute bony findings.   Electronically Signed   By: Marijo Sanes M.D.   On: 12/29/2018 20:54     Complexity Note: Imaging results reviewed. Results shared with Ms. Asch, using Layman's terms.                         ROS  Cardiovascular: High blood pressure Pulmonary or Respiratory: Wheezing and difficulty taking a deep full breath (Asthma), Snoring , Coughing up mucus (Bronchitis) and Temporary stoppage of breathing during sleep Neurological: Stroke (Residual deficits or weakness: urine) and Incontinence:  Urinary Review of Past Neurological Studies:  Results for orders placed or performed during the hospital encounter of 12/29/18  CT Head Wo Contrast   Narrative   CLINICAL DATA:  Posttraumatic headache and neck pain after fall.  EXAM: CT HEAD WITHOUT CONTRAST  CT CERVICAL SPINE WITHOUT CONTRAST  TECHNIQUE: Multidetector CT imaging of the head and cervical spine was performed following the standard protocol without intravenous contrast. Multiplanar CT image reconstructions of the cervical spine were also generated.  COMPARISON:  None.  FINDINGS: CT HEAD FINDINGS  Brain: Mild chronic ischemic white matter disease is noted. No mass effect or midline shift is noted. Ventricular size is within normal limits. There is no evidence of mass lesion, hemorrhage or acute infarction.  Vascular: No hyperdense vessel or unexpected calcification.  Skull: Normal. Negative for fracture or focal lesion.  Sinuses/Orbits: No acute finding.  Other: None.  CT CERVICAL SPINE FINDINGS  Alignment: Normal.  Skull base and vertebrae: No  acute fracture. No primary bone lesion or focal pathologic process.  Soft tissues and spinal canal: No prevertebral fluid or swelling. No visible canal hematoma.  Disc levels: Moderate degenerative disc disease is noted at C6-7 and C7-T1 with anterior osteophyte formation.  Upper chest: Negative.  Other: Degenerative changes are seen involving posterior facet joints bilaterally.  IMPRESSION: Mild chronic ischemic white matter disease. No acute intracranial abnormality seen.  Moderate multilevel degenerative disc disease. No acute abnormality seen in the cervical spine.   Electronically Signed   By: Marijo Conception M.D.   On: 12/29/2018 20:52    Psychological-Psychiatric: Anxiousness, Depressed and Prone to panicking Gastrointestinal: Heartburn due to stomach pushing into lungs (Hiatal hernia), Reflux or heatburn and Irregular, infrequent bowel movements (Constipation) Genitourinary: No reported renal or genitourinary signs or symptoms such as difficulty voiding or producing urine, peeing blood, non-functioning kidney, kidney stones, difficulty emptying the bladder, difficulty controlling the flow of urine, or chronic kidney disease Hematological: Weakness due to low blood hemoglobin or red blood cell count (Anemia) Endocrine: High blood sugar requiring insulin (IDDM) Rheumatologic: Joint aches and or swelling due to excess weight (Osteoarthritis), Rheumatoid arthritis and Generalized muscle aches (Fibromyalgia) Musculoskeletal: Negative for myasthenia gravis, muscular dystrophy, multiple sclerosis or malignant hyperthermia Work History: Disabled  Allergies  Ms. Duplechain is allergic to ciprofloxacin; cyclobenzaprine; diazepam; ketorolac; mushroom extract complex; neomycin-polymyxin-gramicidin; ranitidine hcl; and ondansetron.  Laboratory Chemistry Profile   Screening Lab Results  Component Value Date   HIV NON-REACTIVE 12/11/2018    Inflammation (CRP: Acute Phase) (ESR:  Chronic Phase) Lab Results  Component Value Date   CRP <0.8 12/28/2018   ESRSEDRATE 35 (H) 04/26/2019                         Rheumatology Lab Results  Component Value Date   LABURIC 6.7 12/22/2018                        Renal Lab Results  Component Value Date   BUN 22 05/11/2019   CREATININE 0.97 05/11/2019   LABCREA 159 01/08/2019   BCR 18 04/14/2019   GFRAA >60 05/11/2019   GFRNONAA >60 05/11/2019                             Hepatic Lab Results  Component Value Date   AST 19 04/26/2019   ALT 9 04/26/2019   ALBUMIN 4.2 04/26/2019   ALKPHOS 108 04/26/2019                        Electrolytes Lab Results  Component Value Date   NA 131 (L) 05/11/2019   K 5.0 05/11/2019   CL 97 (L) 05/11/2019   CALCIUM 8.8 (L) 05/11/2019   MG 1.6 03/01/2019                        Neuropathy Lab Results  Component Value Date   VITAMINB12 342 03/01/2019   FOLATE 12.2 03/01/2019   HGBA1C 8.8 (H) 04/14/2019   HIV NON-REACTIVE 12/11/2018                        CNS No results found for: COLORCSF, APPEARCSF, RBCCOUNTCSF, WBCCSF, POLYSCSF, LYMPHSCSF, EOSCSF, PROTEINCSF, GLUCCSF, JCVIRUS, CSFOLI, IGGCSF, LABACHR, ACETBL                      Bone No results found for: [place], [id number], [id number], [id number], 25OHVITD1, 25OHVITD2, 25OHVITD3, TESTOFREE, TESTOSTERONE                       Coagulation Lab Results  Component Value Date   PLT 257 04/26/2019                        Cardiovascular Lab Results  Component Value Date   HGB 11.5 (L) 04/26/2019   HCT 33.5 (L) 04/26/2019                         ID Lab Results  Component Value Date   HIV NON-REACTIVE 12/11/2018    Cancer No results found for: CEA, CA125, LABCA2                      Endocrine Lab Results  Component Value Date   TSH 1.68 03/01/2019                        Note: Lab results reviewed.  PFSH  Drug: Ms. Corcino  reports no history of drug use. Alcohol:  reports no history of alcohol  use. Tobacco:  reports that she has never smoked. She has never used smokeless tobacco. Medical:  has a past medical history of Anxiety, Arthritis, Bipolar 1 disorder ([place]) (2006), Depression, Diabetes mellitus without complication ([place]), Fibromyalgia, GERD (gastroesophageal reflux disease), Hodgkin's lymphoma ([place]), Hyperlipidemia, IBS (irritable bowel syndrome), Incontinence, RA (rheumatoid arthritis) ([place]), and Stroke ([place]) (2019). Family: family history includes Bone cancer in her maternal aunt; Colon cancer in her maternal uncle; Hypertension in her brother, mother, and sister; Lung cancer in her mother.  Past Surgical History:  Procedure Laterality Date  . BACK SURGERY    . CESAREAN SECTION    . ELBOW ARTHROSCOPY    . FINGER SURGERY    . HAND SURGERY    . HERNIA REPAIR    . SPINE SURGERY     Active Ambulatory Problems    Diagnosis Date Noted  . Anxiety   . Bipolar 1 disorder ([place]) 07/29/2004  . Depression   . Dyslipidemia associated with type 2 diabetes mellitus ([place]) 12/11/2018  . Compression fracture of L1 lumbar vertebra ([place]) 12/11/2018  . 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 Albuminuria 12/18/2018  . Goals of care, counseling/discussion 12/26/2018  . Temporal arteritis ([place]) 01/01/2019  . Incontinence   . RA (rheumatoid arthritis) ([place])   . Cerebrovascular accident (CVA) ([place]) 01/08/2019  . 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 Diabetic neuropathy ([place]) 02/15/2019  . Constipation 04/13/2019  . Vertigo 04/13/2019  . Hypertriglyceridemia 04/13/2019  . Benign essential HTN 04/15/2019  . Diverticulitis 05/05/2018  . Fibromyalgia 03/21/2016  . GERD (gastroesophageal reflux disease) 04/03/2011  . Hiatal hernia 01/12/2007  . Hypertensive disorder 05/05/2018  . Irritable bowel syndrome 09/19/2014  . Capsulitis 05/20/2019  . Lumbar degenerative disc disease 06/08/2019  . Lumbar facet arthropathy 06/08/2019  . Chronic pain syndrome  06/08/2019   Resolved Ambulatory Problems    Diagnosis Date Noted  . No Resolved Ambulatory Problems   Past Medical History:  Diagnosis Date  . Arthritis   . Diabetes mellitus without complication ([place])   . Hodgkin's lymphoma ([place])   . Hyperlipidemia   . IBS (irritable bowel syndrome)   . Stroke [facility]) 2019   Constitutional Exam  General appearance: Well nourished, well developed, and well hydrated. In no apparent acute distress Vitals:   06/08/19 1315  BP: (!) 153/87  Pulse: 69  Temp: 97.9 F (36.6 C)  SpO2: 99%  Weight: 189 lb (85.7 kg)  Height:  5' 4"  (1.626 m)   BMI Assessment: Estimated body mass index is 32.44 kg/m as calculated from the following:   Height as of this encounter: 5' 4"  (1.626 m).   Weight as of this encounter: 189 lb (85.7 kg).  BMI interpretation table: BMI level Category Range association with higher incidence of chronic pain  <18 kg/m2 Underweight   18.5-24.9 kg/m2 Ideal body weight   25-29.9 kg/m2 Overweight Increased incidence by 20%  30-34.9 kg/m2 Obese (Class I) Increased incidence by 68%  35-39.9 kg/m2 Severe obesity (Class II) Increased incidence by 136%  >40 kg/m2 Extreme obesity (Class III) Increased incidence by 254%   Patient's current BMI Ideal Body weight  Body mass index is 32.44 kg/m. Ideal body weight: 54.7 kg (120 lb 9.5 oz) Adjusted ideal body weight: 67.1 kg (147 lb 15.3 oz)   BMI Readings from Last 4 Encounters:  06/08/19 32.44 kg/m  05/17/19 31.89 kg/m  04/30/19 34.58 kg/m  04/26/19 32.27 kg/m   Wt Readings from Last 4 Encounters:  06/08/19 189 lb (85.7 kg)  05/17/19 180 lb (81.6 kg)  04/30/19 186 lb (84.4 kg)  04/26/19 188 lb (85.3 kg)  Psych/Mental status: Alert, oriented x 3 (person, place, & time)       Eyes: PERLA Respiratory: No evidence of acute respiratory distress  Cervical Spine Area Exam  Skin & Axial Inspection: No masses, redness, edema, swelling, or associated skin lesions Alignment:  Symmetrical Functional ROM: Unrestricted ROM      Stability: No instability detected Muscle Tone/Strength: Functionally intact. No obvious neuro-muscular anomalies detected. Sensory (Neurological): Unimpaired Palpation: No palpable anomalies              Upper Extremity (UE) Exam    Side: Right upper extremity  Side: Left upper extremity  Skin & Extremity Inspection: Skin color, temperature, and hair growth are WNL. No peripheral edema or cyanosis. No masses, redness, swelling, asymmetry, or associated skin lesions. No contractures.  Skin & Extremity Inspection: Skin color, temperature, and hair growth are WNL. No peripheral edema or cyanosis. No masses, redness, swelling, asymmetry, or associated skin lesions. No contractures.  Functional ROM: Unrestricted ROM          Functional ROM: Unrestricted ROM          Muscle Tone/Strength: Functionally intact. No obvious neuro-muscular anomalies detected.  Muscle Tone/Strength: Functionally intact. No obvious neuro-muscular anomalies detected.  Sensory (Neurological): Unimpaired          Sensory (Neurological): Unimpaired          Palpation: No palpable anomalies              Palpation: No palpable anomalies              Provocative Test(s):  Phalen's test: deferred Tinel's test: deferred Apley's scratch test (touch opposite shoulder):  Action 1 (Across chest): deferred Action 2 (Overhead): deferred Action 3 (LB reach): deferred   Provocative Test(s):  Phalen's test: deferred Tinel's test: deferred Apley's scratch test (touch opposite shoulder):  Action 1 (Across chest): deferred Action 2 (Overhead): deferred Action 3 (LB reach): deferred    Thoracic Spine Area Exam  Skin & Axial Inspection: brace in place Alignment: Symmetrical Functional ROM: Mechanically restricted ROM Stability: No instability detected Muscle Tone/Strength: Functionally intact. No obvious neuro-muscular anomalies detected. Sensory (Neurological): Movement associated  pain Muscle strength & Tone: No palpable anomalies  Lumbar Spine Area Exam  Skin & Axial Inspection: brace in place Alignment: Symmetrical Functional ROM: Decreased ROM affecting primarily the  right Stability: Possibly unstable Muscle Tone/Strength: Functionally intact. No obvious neuro-muscular anomalies detected. Sensory (Neurological): Movement-associated pain Palpation: Complains of area being tender to palpation       Provocative Tests: Hyperextension/rotation test: deferred today       Lumbar quadrant test (Kemp's test): deferred today       Lateral bending test: deferred today       Patrick's Maneuver: deferred today                   FABER* test: deferred today                   S-I anterior distraction/compression test: deferred today         S-I lateral compression test: deferred today         S-I Thigh-thrust test: deferred today         S-I Gaenslen's test: deferred today         *(Flexion, ABduction and External Rotation)  Gait & Posture Assessment  Ambulation: Patient came in today in a wheel chair Gait: Significantly limited. Dependent on assistive device to ambulate Posture: Difficulty standing up straight, due to pain   Lower Extremity Exam    Side: Right lower extremity  Side: Left lower extremity  Stability: No instability observed          Stability: No instability observed          Skin & Extremity Inspection: Skin color, temperature, and hair growth are WNL. No peripheral edema or cyanosis. No masses, redness, swelling, asymmetry, or associated skin lesions. No contractures.  Skin & Extremity Inspection: Skin color, temperature, and hair growth are WNL. No peripheral edema or cyanosis. No masses, redness, swelling, asymmetry, or associated skin lesions. No contractures.  Functional ROM: Pain restricted ROM for all joints of the lower extremity          Functional ROM: Pain restricted ROM for hip and knee joints          Muscle Tone/Strength: Functionally  intact. No obvious neuro-muscular anomalies detected.  Muscle Tone/Strength: Functionally intact. No obvious neuro-muscular anomalies detected.  Sensory (Neurological): Neurogenic pain pattern        Sensory (Neurological): Neurogenic pain pattern        DTR: Patellar: 1+: trace Achilles: 0: absent Plantar: deferred today  DTR: Patellar: 1+: trace Achilles: 0: absent Plantar: deferred today  Palpation: No palpable anomalies  Palpation: No palpable anomalies   Assessment  Primary Diagnosis & Pertinent Problem List: The primary encounter diagnosis was Chronic pain syndrome. Diagnoses of Compression fracture of L1 vertebra with routine healing, subsequent encounter, Fibromyalgia, Bipolar 1 disorder ([place]), Diabetic polyneuropathy associated with type 2 diabetes mellitus ([place]), Cerebrovascular accident (CVA), unspecified mechanism ([place]), Vertigo, Lumbar facet arthropathy, and Lumbar degenerative disc disease were also pertinent to this visit.  Visit Diagnosis (New problems to examiner): 1. Chronic pain syndrome   2. Compression fracture of L1 vertebra with routine healing, subsequent encounter   3. Fibromyalgia   4. Bipolar 1 disorder ([place])   5. Diabetic polyneuropathy associated with type 2 diabetes mellitus ([place])   6. Cerebrovascular accident (CVA), unspecified mechanism ([place])   7. Vertigo   8. Lumbar facet arthropathy   9. Lumbar degenerative disc disease    Plan of Care (Initial workup plan)  Note: Ms. Mutchler was reminded that as per protocol, today's visit has been an evaluation only. We have not taken over the patient's controlled substance management.  I had extensive discussion with  the patient regarding treatment options.  At this point, I would like to see what the neurosurgical plan is and if surgery is going to be pursued.  In regards to interventional options, I am hesitant to offer the patient lumbar facet medial branch nerve blocks at this time given that she is on Plavix and  had a stroke in 2019.  We did talk about this procedure and discussed diagnostic lumbar facet medial branch nerve blocks that we could possibly do with local anesthetic only.  If she experiences pain relief with the diagnostic lumbar facet nerve blocks, can consider lumbar radiofrequency ablation without the use of steroid.  In regards to medication management, we will have the patient complete a urine toxicology screen and see psychiatry for substance abuse disorder evaluation.  Patient does have a history of depression and bipolar disorder but seems to be managing it the best that she can.  She states that her fall has resulted in significant depression given that she is dependent upon caregivers for her ADLs which makes her very sad.  I instructed the patient to follow-up with me after she has seen the psychiatrist at which point we can discuss low-dose opioid therapy that she can take in conjunction with her multimodal analgesics including Cymbalta, gabapentin as above.   Lab Orders     UDS (Comprehensive-24) ([facility]) ([facility]) (New Pt.)  Referral Orders     SUD Evaluation (Med.Psych.)  Pharmacological management options:  Opioid Analgesics: The patient was informed that there is no guarantee that she would be a candidate for opioid analgesics. The decision will be made following CDC guidelines. This decision will be based on the results of diagnostic studies, as well as Ms. Lefferts's risk profile.  Percocet 5 mg twice daily to 3 times daily as needed  Membrane stabilizer: Adequate regimen  Muscle relaxant: Avoid, patient already has vertigo and could increase her fall risk  NSAID: To be determined at a later time  Other analgesic(s): To be determined at a later time   Interventional management options: Ms. Economos was informed that there is no guarantee that she would be a candidate for interventional therapies. The decision will be based on the results of diagnostic studies, as well as  Ms. Ruta's risk profile.  Procedure(s) under consideration:  Diagnostic lumbar facet medial branch nerve blocks with local anesthetic only (will need to stop Plavix) Lumbar facet radiofrequency ablation at L3, L4, L5   Provider-requested follow-up: Return for Pt will call to sch 2nd visit After Psychological evaluation.  Future Appointments  Date Time Provider [place]  06/10/2019  3:00 PM [facility] [place] 1 [facility]  06/11/2019 10:00 AM Kate Sable, MD [facility]  06/14/2019  2:20 PM [facility] DEXA 1 [facility]  06/14/2019  3:00 PM [facility] [place]  10/25/2019  1:30 PM [facility] LAB [facility] None  10/25/2019  2:00 PM Earlie Server, MD [facility] None  05/17/2020  2:45 PM [facility] Austin Seta, MD [facility] None    Primary Care Physician: Victoria Hartshorn, FNP Location: [facility] Note by: Gillis Santa, MD Date: 06/08/2019; Time: 2:56 PM  Note: This dictation was prepared with Dragon dictation. Any transcriptional errors that may result from this process are unintentional.

## 2019-06-09 ENCOUNTER — Encounter: Payer: Self-pay | Admitting: Family Medicine

## 2019-06-10 ENCOUNTER — Other Ambulatory Visit: Payer: Medicare Other

## 2019-06-11 ENCOUNTER — Ambulatory Visit: Payer: Medicare Other | Admitting: Cardiology

## 2019-06-11 LAB — COMPLIANCE DRUG ANALYSIS, UR

## 2019-06-13 ENCOUNTER — Encounter: Payer: Self-pay | Admitting: Family Medicine

## 2019-06-14 ENCOUNTER — Ambulatory Visit: Payer: Medicare Other | Admitting: Cardiology

## 2019-06-14 ENCOUNTER — Other Ambulatory Visit: Payer: Medicare Other

## 2019-06-15 DIAGNOSIS — Z794 Long term (current) use of insulin: Secondary | ICD-10-CM | POA: Diagnosis not present

## 2019-06-15 DIAGNOSIS — F3132 Bipolar disorder, current episode depressed, moderate: Secondary | ICD-10-CM | POA: Diagnosis not present

## 2019-06-15 DIAGNOSIS — N1831 Chronic kidney disease, stage 3a: Secondary | ICD-10-CM | POA: Diagnosis not present

## 2019-06-15 DIAGNOSIS — F411 Generalized anxiety disorder: Secondary | ICD-10-CM | POA: Diagnosis not present

## 2019-06-15 DIAGNOSIS — E1121 Type 2 diabetes mellitus with diabetic nephropathy: Secondary | ICD-10-CM | POA: Diagnosis not present

## 2019-06-15 DIAGNOSIS — E1142 Type 2 diabetes mellitus with diabetic polyneuropathy: Secondary | ICD-10-CM | POA: Diagnosis not present

## 2019-06-15 DIAGNOSIS — E1165 Type 2 diabetes mellitus with hyperglycemia: Secondary | ICD-10-CM | POA: Diagnosis not present

## 2019-06-15 DIAGNOSIS — I10 Essential (primary) hypertension: Secondary | ICD-10-CM | POA: Diagnosis not present

## 2019-06-16 ENCOUNTER — Other Ambulatory Visit: Payer: Self-pay

## 2019-06-16 ENCOUNTER — Ambulatory Visit (INDEPENDENT_AMBULATORY_CARE_PROVIDER_SITE_OTHER): Payer: Medicare Other | Admitting: Orthotics

## 2019-06-16 DIAGNOSIS — M2041 Other hammer toe(s) (acquired), right foot: Secondary | ICD-10-CM | POA: Diagnosis not present

## 2019-06-16 DIAGNOSIS — M722 Plantar fascial fibromatosis: Secondary | ICD-10-CM

## 2019-06-16 DIAGNOSIS — M2040 Other hammer toe(s) (acquired), unspecified foot: Secondary | ICD-10-CM

## 2019-06-16 DIAGNOSIS — E1142 Type 2 diabetes mellitus with diabetic polyneuropathy: Secondary | ICD-10-CM | POA: Diagnosis not present

## 2019-06-16 DIAGNOSIS — M2010 Hallux valgus (acquired), unspecified foot: Secondary | ICD-10-CM

## 2019-06-16 DIAGNOSIS — M2012 Hallux valgus (acquired), left foot: Secondary | ICD-10-CM | POA: Diagnosis not present

## 2019-06-16 NOTE — [place] Notes (Signed)

## 2019-06-17 DIAGNOSIS — S32010A Wedge compression fracture of first lumbar vertebra, initial encounter for closed fracture: Secondary | ICD-10-CM | POA: Diagnosis not present

## 2019-06-21 ENCOUNTER — Telehealth: Payer: Self-pay | Admitting: Emergency Medicine

## 2019-06-21 NOTE — Telephone Encounter (Signed)
Need refill sent to [facility] Lantus needs to be 8 box using 85 units at bedtime. Novolog sliding scale 6 units before meal  Need 3 boxes to last for 3 months

## 2019-06-21 NOTE — Telephone Encounter (Signed)
Please pend and I will sign

## 2019-06-22 ENCOUNTER — Other Ambulatory Visit: Payer: Self-pay | Admitting: Emergency Medicine

## 2019-06-22 ENCOUNTER — Other Ambulatory Visit: Payer: Self-pay

## 2019-06-22 ENCOUNTER — Ambulatory Visit (INDEPENDENT_AMBULATORY_CARE_PROVIDER_SITE_OTHER): Payer: Medicare Other

## 2019-06-22 ENCOUNTER — Ambulatory Visit
Admission: RE | Admit: 2019-06-22 | Discharge: 2019-06-22 | Disposition: A | Discharge status: Home / Self Care | Payer: Medicare Other | Source: Ambulatory Visit | Attending: Family Medicine | Admitting: Family Medicine

## 2019-06-22 DIAGNOSIS — F411 Generalized anxiety disorder: Secondary | ICD-10-CM | POA: Diagnosis not present

## 2019-06-22 DIAGNOSIS — E538 Deficiency of other specified B group vitamins: Secondary | ICD-10-CM

## 2019-06-22 DIAGNOSIS — Z780 Asymptomatic menopausal state: Secondary | ICD-10-CM | POA: Diagnosis not present

## 2019-06-22 DIAGNOSIS — F3132 Bipolar disorder, current episode depressed, moderate: Secondary | ICD-10-CM | POA: Diagnosis not present

## 2019-06-22 DIAGNOSIS — Z794 Long term (current) use of insulin: Secondary | ICD-10-CM

## 2019-06-22 DIAGNOSIS — M85852 Other specified disorders of bone density and structure, left thigh: Secondary | ICD-10-CM | POA: Diagnosis not present

## 2019-06-22 DIAGNOSIS — E1165 Type 2 diabetes mellitus with hyperglycemia: Secondary | ICD-10-CM

## 2019-06-22 MED ORDER — CYANOCOBALAMIN 1000 MCG/ML IJ SOLN
1000.0000 ug | Freq: Once | INTRAMUSCULAR | Status: AC
  Administered 2019-06-22: 1000 ug via INTRAMUSCULAR

## 2019-06-22 MED ORDER — LANTUS SOLOSTAR 100 UNIT/ML ~~LOC~~ SOPN: 85.0000 [IU] | PEN_INJECTOR | Freq: Every day | SUBCUTANEOUS | 3 refills | Status: DC

## 2019-06-22 MED ORDER — NOVOLOG FLEXPEN 100 UNIT/ML ~~LOC~~ SOPN: 14.0000 [IU] | PEN_INJECTOR | Freq: Three times a day (TID) | SUBCUTANEOUS | 3 refills | Status: DC

## 2019-06-22 NOTE — Telephone Encounter (Signed)
Order pend. She did not get enough pens and the script is saying 14 units 3 times a day. There is plenty for sliding scales if she get enough pens

## 2019-06-22 NOTE — Telephone Encounter (Signed)
Rx's approved.

## 2019-06-29 ENCOUNTER — Encounter: Payer: Self-pay | Admitting: Family Medicine

## 2019-06-29 DIAGNOSIS — F3132 Bipolar disorder, current episode depressed, moderate: Secondary | ICD-10-CM | POA: Diagnosis not present

## 2019-06-29 DIAGNOSIS — F411 Generalized anxiety disorder: Secondary | ICD-10-CM | POA: Diagnosis not present

## 2019-07-02 ENCOUNTER — Other Ambulatory Visit: Payer: Self-pay | Admitting: Family Medicine

## 2019-07-02 ENCOUNTER — Encounter: Payer: Self-pay | Admitting: Family Medicine

## 2019-07-02 DIAGNOSIS — I10 Essential (primary) hypertension: Secondary | ICD-10-CM

## 2019-07-02 DIAGNOSIS — F319 Bipolar disorder, unspecified: Secondary | ICD-10-CM

## 2019-07-02 MED ORDER — PROPRANOLOL HCL 10 MG PO TABS: 10.0000 mg | ORAL_TABLET | Freq: Two times a day (BID) | ORAL | 0 refills | Status: DC

## 2019-07-02 MED ORDER — MAGNESIUM-OXIDE 400 (241.3 MG) MG PO TABS: 400.0000 mg | ORAL_TABLET | Freq: Every day | ORAL | 0 refills | Status: DC

## 2019-07-08 ENCOUNTER — Other Ambulatory Visit: Payer: Medicare Other

## 2019-07-09 ENCOUNTER — Ambulatory Visit: Payer: Medicare Other | Admitting: Cardiology

## 2019-07-13 ENCOUNTER — Encounter: Payer: Self-pay | Admitting: Family Medicine

## 2019-07-13 ENCOUNTER — Ambulatory Visit (INDEPENDENT_AMBULATORY_CARE_PROVIDER_SITE_OTHER): Payer: Medicare Other | Admitting: Family Medicine

## 2019-07-13 ENCOUNTER — Other Ambulatory Visit: Payer: Self-pay

## 2019-07-13 DIAGNOSIS — M8580 Other specified disorders of bone density and structure, unspecified site: Secondary | ICD-10-CM

## 2019-07-13 DIAGNOSIS — E559 Vitamin D deficiency, unspecified: Secondary | ICD-10-CM

## 2019-07-13 DIAGNOSIS — S32010S Wedge compression fracture of first lumbar vertebra, sequela: Secondary | ICD-10-CM

## 2019-07-13 DIAGNOSIS — E1165 Type 2 diabetes mellitus with hyperglycemia: Secondary | ICD-10-CM

## 2019-07-13 DIAGNOSIS — E6609 Other obesity due to excess calories: Secondary | ICD-10-CM | POA: Diagnosis not present

## 2019-07-13 DIAGNOSIS — Z794 Long term (current) use of insulin: Secondary | ICD-10-CM

## 2019-07-13 NOTE — Patient Instructions (Signed)
Prolia - research this medication.

## 2019-07-13 NOTE — Progress Notes (Signed)
Name: Victoria Austin   MRN: [id number]    DOB: [birth year]   Date:07/13/2019       Progress Note  Subjective  Chief Complaint  Chief Complaint  Patient presents with  . Follow-up    bone denisty and options  . Consult    patient stated she cancelled surgical clearance Cardiology and upcoming back surgery after getting a 2nd opinion    I connected with  [name] on 07/13/19 at 11:20 AM EST by telephone and verified that I am speaking with the correct person using two identifiers.   I discussed the limitations, risks, security and privacy concerns of performing an evaluation and management service by telephone and the availability of in person appointments. Staff also discussed with the patient that there may be a patient responsible charge related to this service. Patient Location: Home Provider Location: Office Additional Individuals present: None  HPI  PT presents to follow up on bone density showing  - The probability of a major osteoporotic fracture is 21.1% within the next ten years. - The probability of a hip fracture is 3.5% within the next ten years. - T-score of 1.9 - She is considered osteopenic, however her FRAX scores are above normal.  - Vitamin D 50,000IU weekly right now - has been on weekly dose for quite some time.  Has taken calcium supplement in the past, but has not been taking recently. - She does have GERD - she has history of difficulty swallowing in the past, however this was about 3-4 years ago.  She denies any recent issues with swallowing, however still struggles with severe acid reflux.  She is taking protonix.  - Most recent kidney function was stable.   - Dentition is poor - had pyria as a child and only has 53 of her own teeth left; has dentures.   Likely not a candidate for Fossamax; will check labs and research Prolia.  Patient Active Problem List   Diagnosis Date Noted  . Lumbar degenerative disc disease 06/08/2019  . Lumbar facet  arthropathy 06/08/2019  . Chronic pain syndrome 06/08/2019  . Capsulitis 05/20/2019  . Benign essential HTN 04/15/2019  . Constipation 04/13/2019  . Vertigo 04/13/2019  . Hypertriglyceridemia 04/13/2019  . 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 Diabetic neuropathy ([place]) 02/15/2019  . Cerebrovascular accident (CVA) ([place]) 01/08/2019  . Incontinence   . RA (rheumatoid arthritis) ([place])   . Temporal arteritis ([place]) 01/01/2019  . Goals of care, counseling/discussion 12/26/2018  . Albuminuria 12/18/2018  . Dyslipidemia associated with type 2 diabetes mellitus ([place]) 12/11/2018  . Compression fracture of L1 lumbar vertebra ([place]) 12/11/2018  . 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 Anxiety   . Depression   . Diverticulitis 05/05/2018  . Hypertensive disorder 05/05/2018  . Fibromyalgia 03/21/2016  . Irritable bowel syndrome 09/19/2014  . GERD (gastroesophageal reflux disease) 04/03/2011  . Hiatal hernia 01/12/2007  . Bipolar 1 disorder ([place]) 07/29/2004    Social History   Tobacco Use  . Smoking status: Never Smoker  . Smokeless tobacco: Never Used  Substance Use Topics  . Alcohol use: Never     Current Outpatient Medications:  .  ADVAIR HFA [id number] MCG/ACT inhaler, Inhale 1 puff into the lungs 2 (two) times daily., Disp: 1 Inhaler, Rfl: 3 .  albuterol (PROAIR HFA) 108 (90 Base) MCG/ACT inhaler, Take 1 puff by mouth 2 (two) times daily as needed., Disp: , Rfl:  .  buPROPion (WELLBUTRIN) 100 MG tablet,  Take 100 mg by mouth daily., Disp: , Rfl:  .  carbamazepine (TEGRETOL) 200 MG tablet, Take 1 tablet by mouth 3 (three) times daily., Disp: , Rfl:  .  cloNIDine (CATAPRES) 0.1 MG tablet, Take 0.1 mg by mouth daily as needed. , Disp: , Rfl:  .  clopidogrel (PLAVIX) 75 MG tablet, Take 1 tablet (75 mg total) by mouth daily., Disp: 90 tablet, Rfl: 1 .  Continuous Blood Gluc Receiver (FREESTYLE LIBRE 14 DAY READER) DEVI, 1 Device by Does not apply route 4  (four) times daily., Disp: 1 Device, Rfl: 0 .  Continuous Blood Gluc Sensor (FREESTYLE LIBRE 14 DAY SENSOR) MISC, 1 Device by Does not apply route every 14 (fourteen) days., Disp: 6 each, Rfl: 1 .  cyanocobalamin (,VITAMIN B-12,) 1000 MCG/ML injection, Inject 1,000 mcg into the muscle once. Once a month, Disp: , Rfl:  .  docusate sodium (COLACE) 100 MG capsule, Take 100 mg by mouth 2 (two) times daily., Disp: , Rfl:  .  DULoxetine (CYMBALTA) 60 MG capsule, Take 1 capsule by mouth daily., Disp: , Rfl:  .  EPINEPHrine (EPIPEN 2-PAK) 0.3 mg/0.3 mL IJ SOAJ injection, Inject 0.3 mLs (0.3 mg total) into the muscle as needed for anaphylaxis. Then call 911 for ER transport., Disp: 0.3 mL, Rfl: 1 .  gabapentin (NEURONTIN) 300 MG capsule, Take 2 capsules (600 mg total) by mouth at bedtime., Disp: 180 capsule, Rfl: 1 .  hydrALAZINE (APRESOLINE) 50 MG tablet, Take 1 tablet (50 mg total) by mouth 3 (three) times daily., Disp: 270 tablet, Rfl: 1 .  Icosapent Ethyl 1 g CAPS, Take 2 capsules (2 g total) by mouth 2 (two) times daily., Disp: 360 capsule, Rfl: 3 .  Insulin Glargine (LANTUS SOLOSTAR) 100 UNIT/ML Solostar Pen, Inject 85 Units into the skin daily., Disp: 10 pen, Rfl: 3 .  lisinopril (ZESTRIL) 10 MG tablet, , Disp: , Rfl:  .  loratadine (CLARITIN) 10 MG tablet, Take 1 tablet (10 mg total) by mouth daily., Disp: 90 tablet, Rfl: 1 .  LORazepam (ATIVAN) 1 MG tablet, Take 1 tablet by mouth daily., Disp: , Rfl:  .  MAGNESIUM-OXIDE 400 (241.3 Mg) MG tablet, Take 1 tablet (400 mg total) by mouth daily., Disp: 30 tablet, Rfl: 0 .  meclizine (ANTIVERT) 25 MG tablet, Take 1 tablet (25 mg total) by mouth 3 (three) times daily as needed for dizziness., Disp: 180 tablet, Rfl: 1 .  methocarbamol (ROBAXIN) 750 MG tablet, Take 1 tablet (750 mg total) by mouth 2 (two) times daily as needed for muscle spasms., Disp: 180 tablet, Rfl: 1 .  mirabegron ER (MYRBETRIQ) 50 MG TB24 tablet, Take 1 tablet (50 mg total) by mouth  daily., Disp: 90 tablet, Rfl: 3 .  montelukast (SINGULAIR) 10 MG tablet, Take 1 tablet (10 mg total) by mouth daily., Disp: 90 tablet, Rfl: 0 .  NIFEdipine (ADALAT CC) 60 MG 24 hr tablet, Take 1 tablet (60 mg total) by mouth 2 (two) times daily., Disp: 180 tablet, Rfl: 1 .  NOVOLOG FLEXPEN 100 UNIT/ML FlexPen, Inject 14 Units into the skin 3 (three) times daily. With meals, Disp: 3 pen, Rfl: 3 .  pantoprazole (PROTONIX) 40 MG tablet, Take 1 tablet (40 mg total) by mouth daily., Disp: 90 tablet, Rfl: 1 .  primidone (MYSOLINE) 250 MG tablet, Take 200 mg by mouth at bedtime. , Disp: , Rfl:  .  promethazine (PHENERGAN) 25 MG tablet, Take 1 tablet by mouth every 6 (six) hours as needed. , Disp: ,  Rfl:  .  propranolol (INDERAL) 10 MG tablet, Take 1 tablet (10 mg total) by mouth 2 (two) times daily., Disp: 60 tablet, Rfl: 0 .  rosuvastatin (CRESTOR) 20 MG tablet, Take 1 tablet (20 mg total) by mouth daily., Disp: 90 tablet, Rfl: 3 .  SURE COMFORT PEN NEEDLES 31G X 5 MM MISC, Inject 1 pen as directed 5 (five) times daily as needed., Disp: 100 each, Rfl: 3 .  traZODone (DESYREL) 50 MG tablet, Take 1 tablet by mouth at bedtime., Disp: , Rfl:  .  trimethoprim (TRIMPEX) 100 MG tablet, Take 1 tablet (100 mg total) by mouth daily., Disp: 90 tablet, Rfl: 3 .  Vitamin D, Ergocalciferol, (DRISDOL) 1.25 MG (50000 UT) CAPS capsule, Take 1 capsule (50,000 Units total) by mouth once a week., Disp: 12 capsule, Rfl: 1  Allergies  Allergen Reactions  . Ciprofloxacin Nausea And Vomiting  . Cyclobenzaprine Nausea And Vomiting  . Diazepam Nausea And Vomiting  . Ketorolac     Other reaction(s): Unknown  . Mushroom Extract Complex   . Neomycin-Polymyxin-Gramicidin   . Ranitidine Hcl   . Ondansetron Nausea And Vomiting    I personally reviewed active problem list, medication list, allergies, notes from last encounter, lab results with the patient/caregiver today.  ROS  Ten systems reviewed and is negative except as  mentioned in HPI  Objective  Virtual encounter, vitals not obtained.  There is no height or weight on file to calculate BMI.  Nursing Note and Vital Signs reviewed.  Physical Exam  Pulmonary/Chest: Effort normal. No respiratory distress. Speaking in complete sentences Neurological: Pt is alert and oriented to person, place, and time. Coordination, speech and gait are normal. Speech is normal. Psychiatric: Patient has a normal mood and affect. behavior is normal. Judgment and thought content normal.  No results found for this or any previous visit (from the past 72 hour(s)).  Assessment & Plan 1. Osteopenia, unspecified location - Likely not a candidate for fossamax; will optimize vitamin D, may be candidate for prolia due to above normal FRAX and recent history of compression fracture from fall. - VITAMIN D 25 Hydroxy (Vit-D Deficiency, Fractures) - PTH, intact and calcium - TSH  2. Compression fracture of L1 vertebra, sequela - VITAMIN D 25 Hydroxy (Vit-D Deficiency, Fractures) - PTH, intact and calcium - TSH  3. Vitamin D deficiency - VITAMIN D 25 Hydroxy (Vit-D Deficiency, Fractures) - PTH, intact and calcium - TSH  4. Obesity due to excess calories with serious comorbidity, unspecified classification - TSH  5. Type 2 diabetes mellitus with hyperglycemia, with long-term current use of insulin (HCC) - TSH - I discussed the assessment and treatment plan with the patient. The patient was provided an opportunity to ask questions and all were answered. The patient agreed with the plan and demonstrated an understanding of the instructions.  - The patient was advised to call back or seek an in-person evaluation if the symptoms worsen or if the condition fails to improve as anticipated.  I provided 18 minutes of non-face-to-face time during this encounter.  Hubbard Hartshorn, FNP

## 2019-07-16 ENCOUNTER — Encounter: Payer: Self-pay | Admitting: Family Medicine

## 2019-07-20 ENCOUNTER — Ambulatory Visit (INDEPENDENT_AMBULATORY_CARE_PROVIDER_SITE_OTHER): Payer: Medicare Other | Admitting: Emergency Medicine

## 2019-07-20 DIAGNOSIS — E538 Deficiency of other specified B group vitamins: Secondary | ICD-10-CM | POA: Diagnosis not present

## 2019-07-20 MED ORDER — CYANOCOBALAMIN 1000 MCG/ML IJ SOLN
1000.0000 ug | Freq: Once | INTRAMUSCULAR | Status: AC
  Administered 2019-07-20: 1000 ug via INTRAMUSCULAR

## 2019-07-20 NOTE — Progress Notes (Signed)
B12 injection 

## 2019-07-21 LAB — PTH, INTACT AND CALCIUM
Calcium: 8.7 mg/dL (ref 8.6–10.4)
PTH: 76 pg/mL — ABNORMAL HIGH (ref 14–64)

## 2019-07-21 LAB — VITAMIN D 25 HYDROXY (VIT D DEFICIENCY, FRACTURES): Vit D, 25-Hydroxy: 38 ng/mL (ref 30–100)

## 2019-07-21 LAB — TSH: TSH: 1.87 mIU/L (ref 0.40–4.50)

## 2019-07-26 ENCOUNTER — Other Ambulatory Visit: Payer: Self-pay | Admitting: Family Medicine

## 2019-07-26 DIAGNOSIS — R7989 Other specified abnormal findings of blood chemistry: Secondary | ICD-10-CM

## 2019-07-26 DIAGNOSIS — E559 Vitamin D deficiency, unspecified: Secondary | ICD-10-CM

## 2019-07-26 DIAGNOSIS — M8580 Other specified disorders of bone density and structure, unspecified site: Secondary | ICD-10-CM

## 2019-07-26 DIAGNOSIS — E349 Endocrine disorder, unspecified: Secondary | ICD-10-CM

## 2019-07-29 ENCOUNTER — Other Ambulatory Visit: Payer: Self-pay | Admitting: Family Medicine

## 2019-07-29 DIAGNOSIS — F319 Bipolar disorder, unspecified: Secondary | ICD-10-CM

## 2019-07-29 DIAGNOSIS — I10 Essential (primary) hypertension: Secondary | ICD-10-CM

## 2019-08-02 ENCOUNTER — Encounter: Payer: Self-pay | Admitting: Family Medicine

## 2019-08-03 ENCOUNTER — Other Ambulatory Visit: Payer: Self-pay | Admitting: Emergency Medicine

## 2019-08-03 DIAGNOSIS — E781 Pure hyperglyceridemia: Secondary | ICD-10-CM

## 2019-08-03 DIAGNOSIS — S32010D Wedge compression fracture of first lumbar vertebra, subsequent encounter for fracture with routine healing: Secondary | ICD-10-CM

## 2019-08-03 DIAGNOSIS — E782 Mixed hyperlipidemia: Secondary | ICD-10-CM

## 2019-08-03 DIAGNOSIS — I10 Essential (primary) hypertension: Secondary | ICD-10-CM

## 2019-08-03 DIAGNOSIS — F319 Bipolar disorder, unspecified: Secondary | ICD-10-CM

## 2019-08-03 DIAGNOSIS — I2510 Atherosclerotic heart disease of native coronary artery without angina pectoris: Secondary | ICD-10-CM

## 2019-08-03 DIAGNOSIS — Z794 Long term (current) use of insulin: Secondary | ICD-10-CM

## 2019-08-03 DIAGNOSIS — E1165 Type 2 diabetes mellitus with hyperglycemia: Secondary | ICD-10-CM

## 2019-08-04 ENCOUNTER — Encounter: Payer: Self-pay | Admitting: Family Medicine

## 2019-08-05 DIAGNOSIS — F3132 Bipolar disorder, current episode depressed, moderate: Secondary | ICD-10-CM | POA: Diagnosis not present

## 2019-08-05 DIAGNOSIS — F411 Generalized anxiety disorder: Secondary | ICD-10-CM | POA: Diagnosis not present

## 2019-08-05 MED ORDER — GABAPENTIN 300 MG PO CAPS: 600.0000 mg | ORAL_CAPSULE | Freq: Every day | ORAL | 1 refills | Status: DC

## 2019-08-05 MED ORDER — PROPRANOLOL HCL 10 MG PO TABS: 10.0000 mg | ORAL_TABLET | Freq: Two times a day (BID) | ORAL | 1 refills | Status: AC

## 2019-08-05 MED ORDER — NIFEDIPINE ER 60 MG PO TB24: 60.0000 mg | ORAL_TABLET | Freq: Two times a day (BID) | ORAL | 1 refills | Status: DC

## 2019-08-05 MED ORDER — ROSUVASTATIN CALCIUM 20 MG PO TABS: 20.0000 mg | ORAL_TABLET | Freq: Every day | ORAL | 3 refills | Status: DC

## 2019-08-05 MED ORDER — PANTOPRAZOLE SODIUM 40 MG PO TBEC: 40.0000 mg | DELAYED_RELEASE_TABLET | Freq: Every day | ORAL | 1 refills | Status: AC

## 2019-08-05 MED ORDER — MAGNESIUM-OXIDE 400 (241.3 MG) MG PO TABS: 400.0000 mg | ORAL_TABLET | Freq: Every day | ORAL | 1 refills | Status: DC

## 2019-08-05 MED ORDER — MECLIZINE HCL 25 MG PO TABS: 25.0000 mg | ORAL_TABLET | Freq: Three times a day (TID) | ORAL | 1 refills | Status: DC | PRN

## 2019-08-05 MED ORDER — PRIMIDONE 250 MG PO TABS: 200.0000 mg | ORAL_TABLET | Freq: Every day | ORAL | 1 refills | Status: DC

## 2019-08-05 MED ORDER — CLOPIDOGREL BISULFATE 75 MG PO TABS: 75.0000 mg | ORAL_TABLET | Freq: Every day | ORAL | 1 refills | Status: DC

## 2019-08-10 ENCOUNTER — Encounter: Payer: Self-pay | Admitting: Family Medicine

## 2019-08-12 ENCOUNTER — Encounter: Payer: Self-pay | Admitting: Family Medicine

## 2019-08-12 DIAGNOSIS — G894 Chronic pain syndrome: Secondary | ICD-10-CM

## 2019-08-12 MED ORDER — MAGNESIUM-OXIDE 400 (241.3 MG) MG PO TABS: 400.0000 mg | ORAL_TABLET | Freq: Every day | ORAL | 1 refills | Status: DC

## 2019-08-12 MED ORDER — PRIMIDONE 250 MG PO TABS: 200.0000 mg | ORAL_TABLET | Freq: Every day | ORAL | 1 refills | Status: DC

## 2019-08-18 DIAGNOSIS — F3132 Bipolar disorder, current episode depressed, moderate: Secondary | ICD-10-CM | POA: Diagnosis not present

## 2019-08-18 DIAGNOSIS — F411 Generalized anxiety disorder: Secondary | ICD-10-CM | POA: Diagnosis not present

## 2019-08-18 DIAGNOSIS — E1165 Type 2 diabetes mellitus with hyperglycemia: Secondary | ICD-10-CM | POA: Diagnosis not present

## 2019-08-19 NOTE — Telephone Encounter (Signed)
Bonnita Nasuti CMA reached out to [employer] regarding issues with refills - due to budget restrictions they are only providing 1 month at a time refills. Pt was notified by Bonnita Nasuti CMA via telephone.

## 2019-08-23 DIAGNOSIS — F3132 Bipolar disorder, current episode depressed, moderate: Secondary | ICD-10-CM | POA: Diagnosis not present

## 2019-08-23 DIAGNOSIS — F411 Generalized anxiety disorder: Secondary | ICD-10-CM | POA: Diagnosis not present

## 2019-08-25 DIAGNOSIS — E875 Hyperkalemia: Secondary | ICD-10-CM | POA: Diagnosis not present

## 2019-08-25 DIAGNOSIS — E1121 Type 2 diabetes mellitus with diabetic nephropathy: Secondary | ICD-10-CM | POA: Diagnosis not present

## 2019-08-25 DIAGNOSIS — E1142 Type 2 diabetes mellitus with diabetic polyneuropathy: Secondary | ICD-10-CM | POA: Diagnosis not present

## 2019-08-25 DIAGNOSIS — E1129 Type 2 diabetes mellitus with other diabetic kidney complication: Secondary | ICD-10-CM | POA: Diagnosis not present

## 2019-08-25 DIAGNOSIS — N2581 Secondary hyperparathyroidism of renal origin: Secondary | ICD-10-CM | POA: Diagnosis not present

## 2019-08-25 DIAGNOSIS — M810 Age-related osteoporosis without current pathological fracture: Secondary | ICD-10-CM | POA: Diagnosis not present

## 2019-08-25 DIAGNOSIS — R809 Proteinuria, unspecified: Secondary | ICD-10-CM | POA: Diagnosis not present

## 2019-08-25 DIAGNOSIS — Z794 Long term (current) use of insulin: Secondary | ICD-10-CM | POA: Diagnosis not present

## 2019-08-25 DIAGNOSIS — N1831 Chronic kidney disease, stage 3a: Secondary | ICD-10-CM | POA: Diagnosis not present

## 2019-08-26 DIAGNOSIS — N2581 Secondary hyperparathyroidism of renal origin: Secondary | ICD-10-CM | POA: Insufficient documentation

## 2019-08-26 DIAGNOSIS — M810 Age-related osteoporosis without current pathological fracture: Secondary | ICD-10-CM | POA: Insufficient documentation

## 2019-08-26 DIAGNOSIS — E1142 Type 2 diabetes mellitus with diabetic polyneuropathy: Secondary | ICD-10-CM | POA: Insufficient documentation

## 2019-08-31 ENCOUNTER — Encounter: Payer: Self-pay | Admitting: Family Medicine

## 2019-09-01 ENCOUNTER — Emergency Department: Payer: Medicare Other

## 2019-09-01 ENCOUNTER — Encounter: Payer: Self-pay | Admitting: Family Medicine

## 2019-09-01 ENCOUNTER — Emergency Department
Admission: EM | Admit: 2019-09-01 | Discharge: 2019-09-01 | Disposition: A | Discharge status: Home / Self Care | Payer: Medicare Other | Attending: Emergency Medicine | Admitting: Emergency Medicine

## 2019-09-01 ENCOUNTER — Ambulatory Visit (INDEPENDENT_AMBULATORY_CARE_PROVIDER_SITE_OTHER): Payer: Medicare Other | Admitting: Family Medicine

## 2019-09-01 ENCOUNTER — Other Ambulatory Visit: Payer: Self-pay

## 2019-09-01 DIAGNOSIS — R519 Headache, unspecified: Secondary | ICD-10-CM | POA: Diagnosis not present

## 2019-09-01 DIAGNOSIS — R040 Epistaxis: Secondary | ICD-10-CM | POA: Diagnosis not present

## 2019-09-01 DIAGNOSIS — I10 Essential (primary) hypertension: Secondary | ICD-10-CM

## 2019-09-01 DIAGNOSIS — R270 Ataxia, unspecified: Secondary | ICD-10-CM | POA: Insufficient documentation

## 2019-09-01 DIAGNOSIS — Z794 Long term (current) use of insulin: Secondary | ICD-10-CM | POA: Diagnosis not present

## 2019-09-01 DIAGNOSIS — Z7902 Long term (current) use of antithrombotics/antiplatelets: Secondary | ICD-10-CM | POA: Insufficient documentation

## 2019-09-01 DIAGNOSIS — H538 Other visual disturbances: Secondary | ICD-10-CM | POA: Insufficient documentation

## 2019-09-01 DIAGNOSIS — E1140 Type 2 diabetes mellitus with diabetic neuropathy, unspecified: Secondary | ICD-10-CM | POA: Insufficient documentation

## 2019-09-01 DIAGNOSIS — H539 Unspecified visual disturbance: Secondary | ICD-10-CM

## 2019-09-01 DIAGNOSIS — Z9989 Dependence on other enabling machines and devices: Secondary | ICD-10-CM | POA: Diagnosis not present

## 2019-09-01 DIAGNOSIS — Z8673 Personal history of transient ischemic attack (TIA), and cerebral infarction without residual deficits: Secondary | ICD-10-CM | POA: Diagnosis not present

## 2019-09-01 DIAGNOSIS — Z79899 Other long term (current) drug therapy: Secondary | ICD-10-CM | POA: Insufficient documentation

## 2019-09-01 DIAGNOSIS — Z7901 Long term (current) use of anticoagulants: Secondary | ICD-10-CM | POA: Diagnosis not present

## 2019-09-01 DIAGNOSIS — G4733 Obstructive sleep apnea (adult) (pediatric): Secondary | ICD-10-CM | POA: Diagnosis not present

## 2019-09-01 DIAGNOSIS — Z8739 Personal history of other diseases of the musculoskeletal system and connective tissue: Secondary | ICD-10-CM

## 2019-09-01 DIAGNOSIS — M316 Other giant cell arteritis: Secondary | ICD-10-CM | POA: Diagnosis not present

## 2019-09-01 DIAGNOSIS — R42 Dizziness and giddiness: Secondary | ICD-10-CM | POA: Diagnosis not present

## 2019-09-01 LAB — SEDIMENTATION RATE: Sed Rate: 27 mm/hr (ref 0–30)

## 2019-09-01 LAB — DIFFERENTIAL
Abs Immature Granulocytes: 0.05 10*3/uL (ref 0.00–0.07)
Basophils Absolute: 0.1 10*3/uL (ref 0.0–0.1)
Basophils Relative: 1 %
Eosinophils Absolute: 0.1 10*3/uL (ref 0.0–0.5)
Eosinophils Relative: 2 %
Immature Granulocytes: 1 %
Lymphocytes Relative: 33 %
Lymphs Abs: 2.2 10*3/uL (ref 0.7–4.0)
Monocytes Absolute: 0.5 10*3/uL (ref 0.1–1.0)
Monocytes Relative: 8 %
Neutro Abs: 3.7 10*3/uL (ref 1.7–7.7)
Neutrophils Relative %: 55 %

## 2019-09-01 LAB — COMPREHENSIVE METABOLIC PANEL
ALT: 12 U/L (ref 0–44)
AST: 22 U/L (ref 15–41)
Albumin: 4.2 g/dL (ref 3.5–5.0)
Alkaline Phosphatase: 90 U/L (ref 38–126)
Anion gap: 11 (ref 5–15)
BUN: 23 mg/dL (ref 8–23)
CO2: 24 mmol/L (ref 22–32)
Calcium: 9.1 mg/dL (ref 8.9–10.3)
Chloride: 100 mmol/L (ref 98–111)
Creatinine, Ser: 1.19 mg/dL — ABNORMAL HIGH (ref 0.44–1.00)
GFR calc Af Amer: 55 mL/min — ABNORMAL LOW (ref >60)
GFR calc non Af Amer: 47 mL/min — ABNORMAL LOW (ref >60)
Glucose, Bld: 182 mg/dL — ABNORMAL HIGH (ref 70–99)
Potassium: 5.4 mmol/L — ABNORMAL HIGH (ref 3.5–5.1)
Sodium: 135 mmol/L (ref 135–145)
Total Bilirubin: 0.5 mg/dL (ref 0.3–1.2)
Total Protein: 7.9 g/dL (ref 6.5–8.1)

## 2019-09-01 LAB — APTT: aPTT: 24 seconds (ref 24–36)

## 2019-09-01 LAB — CBC
HCT: 36.5 % (ref 36.0–46.0)
Hemoglobin: 12.1 g/dL (ref 12.0–15.0)
MCH: 29.7 pg (ref 26.0–34.0)
MCHC: 33.2 g/dL (ref 30.0–36.0)
MCV: 89.7 fL (ref 80.0–100.0)
Platelets: 232 10*3/uL (ref 150–400)
RBC: 4.07 MIL/uL (ref 3.87–5.11)
RDW: 12.6 % (ref 11.5–15.5)
WBC: 6.7 10*3/uL (ref 4.0–10.5)
nRBC: 0 % (ref 0.0–0.2)

## 2019-09-01 LAB — PROTIME-INR
INR: 0.9 (ref 0.8–1.2)
Prothrombin Time: 12.4 seconds (ref 11.4–15.2)

## 2019-09-01 MED ORDER — GADOBUTROL 1 MMOL/ML IV SOLN
7.5000 mL | Freq: Once | INTRAVENOUS | Status: AC | PRN
  Administered 2019-09-01: 7.5 mL via INTRAVENOUS
  Filled 2019-09-01: qty 7.5

## 2019-09-01 MED ORDER — DIPHENHYDRAMINE HCL 50 MG/ML IJ SOLN
12.5000 mg | Freq: Once | INTRAMUSCULAR | Status: AC
  Administered 2019-09-01: 12.5 mg via INTRAVENOUS
  Filled 2019-09-01: qty 1

## 2019-09-01 MED ORDER — SODIUM CHLORIDE 0.9 % IV BOLUS
1000.0000 mL | Freq: Once | INTRAVENOUS | Status: AC
  Administered 2019-09-01: 1000 mL via INTRAVENOUS

## 2019-09-01 MED ORDER — ACETAMINOPHEN 500 MG PO TABS
1000.0000 mg | ORAL_TABLET | Freq: Once | ORAL | Status: AC
  Administered 2019-09-01: 1000 mg via ORAL
  Filled 2019-09-01: qty 2

## 2019-09-01 MED ORDER — SODIUM CHLORIDE 0.9% FLUSH: 3.0000 mL | Freq: Once | INTRAVENOUS | Status: DC

## 2019-09-01 MED ORDER — METOCLOPRAMIDE HCL 5 MG/ML IJ SOLN
10.0000 mg | Freq: Once | INTRAMUSCULAR | Status: AC
  Administered 2019-09-01: 10 mg via INTRAVENOUS
  Filled 2019-09-01: qty 2

## 2019-09-01 NOTE — ED Provider Notes (Signed)
[facility] Emergency Department Provider Note  ____________________________________________   First MD Initiated Contact with Patient 09/01/19 1211     (approximate)  I have reviewed the triage vital signs and the nursing notes.   HISTORY  Chief Complaint Dizziness and Headache    HPI Victoria Austin is a 69 y.o. female with history of temporal arteritis, CVA on Plavix, CPAP, hypertension who comes in with dizziness and headaches.  Patient states she had her headaches for 4 days.  Feels like a pressure sensation on bilateral sides of her head, severe, constant, nothing makes it better, nothing makes it worse.  States that she had associated diplopia where she feels that she has to tilt her head in order to read and occasional blurry vision.  Denies feeling like her prior temporal arteritis.  Denies any other focal weaknesses.       Past Medical History:  Diagnosis Date  . Anxiety   . Arthritis   . Bipolar 1 disorder ([place]) 2006  . Depression   . Diabetes mellitus without complication ([place])   . Fibromyalgia   . GERD (gastroesophageal reflux disease)   . Hodgkin's lymphoma ([place])   . Hyperlipidemia   . IBS (irritable bowel syndrome)   . Incontinence   . RA (rheumatoid arthritis) ([place])   . Stroke [facility]) 2019    Patient Active Problem List   Diagnosis Date Noted  . OSA on CPAP 09/01/2019  . Anticoagulant long-term use 09/01/2019  . Lumbar degenerative disc disease 06/08/2019  . Lumbar facet arthropathy 06/08/2019  . Chronic pain syndrome 06/08/2019  . Capsulitis 05/20/2019  . Benign essential HTN 04/15/2019  . Constipation 04/13/2019  . Vertigo 04/13/2019  . Hypertriglyceridemia 04/13/2019  . 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 Diabetic neuropathy ([place]) 02/15/2019  . Cerebrovascular accident (CVA) ([place]) 01/08/2019  . Incontinence   . RA (rheumatoid arthritis) ([place])   . Temporal arteritis ([place]) 01/01/2019  . Goals of care, counseling/discussion  12/26/2018  . Albuminuria 12/18/2018  . Dyslipidemia associated with type 2 diabetes mellitus ([place]) 12/11/2018  . Compression fracture of L1 lumbar vertebra ([place]) 12/11/2018  . 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 Anxiety   . Depression   . Diverticulitis 05/05/2018  . Hypertensive disorder 05/05/2018  . Fibromyalgia 03/21/2016  . Irritable bowel syndrome 09/19/2014  . GERD (gastroesophageal reflux disease) 04/03/2011  . Hiatal hernia 01/12/2007  . Bipolar 1 disorder ([place]) 07/29/2004    Past Surgical History:  Procedure Laterality Date  . BACK SURGERY    . CESAREAN SECTION    . ELBOW ARTHROSCOPY    . FINGER SURGERY    . HAND SURGERY    . HERNIA REPAIR    . SPINE SURGERY      Prior to Admission medications   Medication Sig Start Date End Date Taking? Authorizing Provider  ADVAIR HFA 230-21 MCG/ACT inhaler Inhale 1 puff into the lungs 2 (two) times daily. 01/01/19   Hubbard Hartshorn, FNP  albuterol (PROAIR HFA) 108 (90 Base) MCG/ACT inhaler Take 1 puff by mouth 2 (two) times daily as needed. 04/27/18   [provider]  alendronate (FOSAMAX) 70 MG tablet Take 1 tablet by mouth once a week. 08/25/19 08/24/20  [provider]  buPROPion (WELLBUTRIN) 100 MG tablet Take 100 mg by mouth daily. 07/10/18   [provider]  carbamazepine (TEGRETOL) 200 MG tablet Take 1 tablet by mouth 3 (three) times daily. 04/27/18   [provider]  cloNIDine (CATAPRES) 0.1 MG tablet Take 0.1 mg by mouth daily as needed.  04/27/18   [provider]  clopidogrel (PLAVIX) 75 MG tablet Take 1 tablet (75 mg total) by mouth daily. 08/05/19   Hubbard Hartshorn, FNP  Continuous Blood Gluc Receiver (FREESTYLE LIBRE 14 DAY READER) DEVI 1 Device by Does not apply route 4 (four) times daily. 12/30/18   Hubbard Hartshorn, FNP  Continuous Blood Gluc Sensor (FREESTYLE LIBRE 14 DAY SENSOR) MISC 1 Device by Does not apply route every 14  (fourteen) days. 12/30/18   Hubbard Hartshorn, FNP  dapagliflozin propanediol (FARXIGA) 10 MG TABS tablet Take 10 mg by mouth daily.    [provider]  docusate sodium (COLACE) 100 MG capsule Take 100 mg by mouth 2 (two) times daily.    [provider]  DULoxetine (CYMBALTA) 60 MG capsule Take 1 capsule by mouth daily. 04/23/18   [provider]  EPINEPHrine (EPIPEN 2-PAK) 0.3 mg/0.3 mL IJ SOAJ injection Inject 0.3 mLs (0.3 mg total) into the muscle as needed for anaphylaxis. Then call 911 for ER transport. 01/01/19   Hubbard Hartshorn, FNP  gabapentin (NEURONTIN) 300 MG capsule Take 2 capsules (600 mg total) by mouth at bedtime. 08/05/19   Hubbard Hartshorn, FNP  hydrALAZINE (APRESOLINE) 50 MG tablet Take 1 tablet (50 mg total) by mouth 3 (three) times daily. 03/19/19   Hubbard Hartshorn, FNP  Icosapent Ethyl 1 g CAPS Take 2 capsules (2 g total) by mouth 2 (two) times daily. 03/04/19   Hubbard Hartshorn, FNP  Insulin Glargine (LANTUS SOLOSTAR) 100 UNIT/ML Solostar Pen Inject 85 Units into the skin daily. 06/22/19   Hubbard Hartshorn, FNP  lisinopril (ZESTRIL) 5 MG tablet Take 1 tablet by mouth daily. 08/25/19   [provider]  loratadine (CLARITIN) 10 MG tablet Take 1 tablet (10 mg total) by mouth daily. 01/01/19   Hubbard Hartshorn, FNP  LORazepam (ATIVAN) 1 MG tablet Take 1 tablet by mouth daily. 06/15/18   [provider]  MAGNESIUM-OXIDE 400 (241.3 Mg) MG tablet Take 1 tablet (400 mg total) by mouth daily. 08/12/19   Hubbard Hartshorn, FNP  meclizine (ANTIVERT) 25 MG tablet Take 1 tablet (25 mg total) by mouth 3 (three) times daily as needed for dizziness. 08/05/19   Hubbard Hartshorn, FNP  methocarbamol (ROBAXIN) 750 MG tablet Take 1 tablet (750 mg total) by mouth 2 (two) times daily as needed for muscle spasms. 04/13/19   Hubbard Hartshorn, FNP  mirabegron ER (MYRBETRIQ) 50 MG TB24 tablet Take 1 tablet (50 mg total) by mouth daily. 05/17/19   Billey Co, MD  montelukast (SINGULAIR) 10 MG  tablet Take 1 tablet (10 mg total) by mouth daily. 01/01/19   Hubbard Hartshorn, FNP  NIFEdipine (ADALAT CC) 60 MG 24 hr tablet Take 1 tablet (60 mg total) by mouth 2 (two) times daily. 08/05/19   Hubbard Hartshorn, FNP  NOVOLOG FLEXPEN 100 UNIT/ML FlexPen Inject 14 Units into the skin 3 (three) times daily. With meals 06/22/19   Hubbard Hartshorn, FNP  pantoprazole (PROTONIX) 40 MG tablet Take 1 tablet (40 mg total) by mouth daily. 08/05/19   Hubbard Hartshorn, FNP  primidone (MYSOLINE) 250 MG tablet Take 1 tablet (250 mg total) by mouth at bedtime. 08/12/19   Hubbard Hartshorn, FNP  promethazine (PHENERGAN) 25 MG tablet Take 1 tablet by mouth every 6 (six) hours as needed.  07/09/18   [provider]  propranolol (INDERAL) 10 MG tablet Take 1 tablet (10 mg total) by mouth 2 (two) times daily. 08/05/19   Hubbard Hartshorn, FNP  rosuvastatin (CRESTOR) 20 MG tablet Take 1 tablet (20 mg total) by mouth daily. 08/05/19   Hubbard Hartshorn, FNP  SURE COMFORT PEN NEEDLES 31G X 5 MM MISC Inject 1 pen as directed 5 (five) times daily as needed. 03/19/19   Hubbard Hartshorn, FNP  traZODone (DESYREL) 50 MG tablet Take 1 tablet by mouth at bedtime. 04/27/18   [provider]  trimethoprim (TRIMPEX) 100 MG tablet Take 1 tablet (100 mg total) by mouth daily. 05/17/19   Billey Co, MD  Vitamin D, Ergocalciferol, (DRISDOL) 1.25 MG (50000 UT) CAPS capsule Take 1 capsule (50,000 Units total) by mouth once a week. 03/09/19   Hubbard Hartshorn, FNP    Allergies Ciprofloxacin, Cyclobenzaprine, Diazepam, Ketorolac, Mushroom extract complex, Neomycin-polymyxin-gramicidin, Ranitidine hcl, and Ondansetron  Family History  Problem Relation Age of Onset  . Lung cancer Mother   . Hypertension Mother   . Hypertension Sister   . Hypertension Brother   . Bone cancer Maternal Aunt   . Colon cancer Maternal Uncle     Social History Social History   Tobacco Use  . Smoking status: Never Smoker  . Smokeless tobacco: Never Used    Substance Use Topics  . Alcohol use: Never  . Drug use: Never      Review of Systems Constitutional: No fever/chills Eyes: Positive changes in vision ENT: No sore throat. Cardiovascular: Denies chest pain. Respiratory: Denies shortness of breath. Gastrointestinal: No abdominal pain.  No nausea, no vomiting.  No diarrhea.  No constipation. Genitourinary: Negative for dysuria. Musculoskeletal: Negative for back pain. Skin: Negative for rash. Neurological: positive headache, no focal weakness or numbness. All other ROS negative ____________________________________________   PHYSICAL EXAM:  VITAL SIGNS: ED Triage Vitals  Enc Vitals Group     BP 09/01/19 1022 116/66     Pulse Rate 09/01/19 1022 78     Resp 09/01/19 1022 18     Temp 09/01/19 1022 98.2 F (36.8 C)     Temp Source 09/01/19 1022 Oral     SpO2 09/01/19 1022 98 %     Weight 09/01/19 1024 183 lb (83 kg)     Height 09/01/19 1024 5' 5"  (1.651 m)     Head Circumference --      Peak Flow --      Pain Score 09/01/19 1024 3     Pain Loc --      Pain Edu? --      Excl. in [place]? --     Constitutional: Alert and oriented. Well appearing and in no acute distress. Eyes: Conjunctivae are normal. EOMI. patient is 20/30 bilaterally Head: Atraumatic. Nose: No congestion/rhinnorhea. Mouth/Throat: Mucous membranes are moist.   Neck: No stridor. Trachea Midline. FROM Cardiovascular: Normal rate, regular rhythm. Grossly normal heart sounds.  Good peripheral circulation. Respiratory: Normal respiratory effort.  No retractions. Lungs CTAB. Gastrointestinal: Soft and nontender. No distention. No abdominal bruits.  Musculoskeletal: No lower extremity tenderness nor edema.  No joint effusions. Neurologic: Cranial nerves II through XII are intact.  Equal strength in arms and legs.  Finger-to-nose intact.  Ambulates normally.  Skin:  Skin is warm, dry and intact. No rash noted. Psychiatric: Mood and affect are normal. Speech and  behavior are normal. GU: Deferred   ____________________________________________   LABS (all labs ordered are listed, but only  abnormal results are displayed)  Labs Reviewed  COMPREHENSIVE METABOLIC PANEL - Abnormal; Notable for the following components:      Result Value   Potassium 5.4 (*)    Glucose, Bld 182 (*)    Creatinine, Ser 1.19 (*)    GFR calc non Af Amer 47 (*)    GFR calc Af Amer 55 (*)    All other components within normal limits  PROTIME-INR  APTT  CBC  DIFFERENTIAL  SEDIMENTATION RATE  BASIC METABOLIC PANEL  CBG MONITORING, ED   ____________________________________________   ED ECG REPORT I, Vanessa Bellefonte, the attending physician, personally viewed and interpreted this ECG.  EKG is normal sinus rate of 74, no ST elevations, no T wave inversions, normal intervals ____________________________________________  RADIOLOGY  Official radiology report(s): CT HEAD WO CONTRAST  Result Date: 09/01/2019 CLINICAL DATA:  Ataxia EXAM: CT HEAD WITHOUT CONTRAST TECHNIQUE: Contiguous axial images were obtained from the base of the skull through the vertex without intravenous contrast. COMPARISON:  12/29/2018 FINDINGS: Brain: No evidence of acute infarction, hemorrhage, extra-axial collection, ventriculomegaly, or mass effect. Generalized cerebral atrophy. Periventricular white matter low attenuation likely secondary to microangiopathy. Vascular: Cerebrovascular atherosclerotic calcifications are noted. Skull: Negative for fracture or focal lesion. Sinuses/Orbits: Visualized portions of the orbits are unremarkable. Visualized portions of the paranasal sinuses are unremarkable. Visualized portions of the mastoid air cells are unremarkable. Other: None. IMPRESSION: No acute intracranial pathology. Electronically Signed   By: Kathreen Devoid   On: 09/01/2019 11:11    ____________________________________________   PROCEDURES  Procedure(s) performed (including Critical  Care):  Procedures   ____________________________________________   INITIAL IMPRESSION / ASSESSMENT AND PLAN / ED COURSE  Shakiyah Cirilo was evaluated in Emergency Department on 09/01/2019 for the symptoms described in the history of present illness. She was evaluated in the context of the global COVID-19 pandemic, which necessitated consideration that the patient might be at risk for infection with the SARS-CoV-2 virus that causes COVID-19. Institutional protocols and algorithms that pertain to the evaluation of patients at risk for COVID-19 are in a state of rapid change based on information released by regulatory bodies including the CDC and federal and state organizations. These policies and algorithms were followed during the patient's care in the ED.    Patient is a well-appearing 69 year old who presents with headaches.  CT head was ordered to evaluate for intracranial hemorrhage versus tumor.  Discussed patient that her neuro exam is reassuring but she states that she was sent over here for an MRI.  I have low suspicion but patient states that she wants to make sure that there is nothing else is going on.  Will get MRI to make sure there is no evidence of venous thrombosis, aneurysm, stroke.  Extraocular movements are intact hearing or vision is normal and I have low suspicion for pseudotumor cerebri. Not severe in onset to suggest [place].   Patient has no white count elevation.  No evidence of anemia.  Her creatinine is slightly elevated at 1.19 with a potassium of 5.4.  Will give some fluids.  Her CT head was negative.  ESR normal unlikely temporal arteritis and pt states it does not feel similar to that.   Reevaluated patient and she feels much better after CT scans.  Patient understands if she is just waiting for MRIs  Patient handed off to Dr. Jimmye Norman pending MRIs and repeat BMP.      ____________________________________________   FINAL CLINICAL IMPRESSION(S) / ED  DIAGNOSES  Final diagnoses:  Nonintractable headache, unspecified chronicity pattern, unspecified headache type  Vision changes      MEDICATIONS GIVEN DURING THIS VISIT:  Medications  sodium chloride flush (NS) 0.9 % injection 3 mL (3 mLs Intravenous Not Given 09/01/19 1154)  metoCLOPramide (REGLAN) injection 10 mg (10 mg Intravenous Given 09/01/19 1257)  diphenhydrAMINE (BENADRYL) injection 12.5 mg (12.5 mg Intravenous Given 09/01/19 1257)  acetaminophen (TYLENOL) tablet 1,000 mg (1,000 mg Oral Given 09/01/19 1257)  gadobutrol (GADAVIST) 1 MMOL/ML injection 7.5 mL (7.5 mLs Intravenous Contrast Given 09/01/19 1601)  sodium chloride 0.9 % bolus 1,000 mL (1,000 mLs Intravenous New Bag/Given 09/01/19 1558)     ED Discharge Orders    None       Note:  This document was prepared using Dragon voice recognition software and may include unintentional dictation errors.   Vanessa [place], MD 09/01/19 [phone]

## 2019-09-01 NOTE — ED Provider Notes (Signed)
IMPRESSION: 1. No acute intracranial abnormality. 2. Chronic lacunar infarct of the left thalamus, but otherwise largely unremarkable for age noncontrast MRI appearance of the brain. IMPRESSION: Normal neck MRA aside from bilateral carotid artery tortuosity. IMPRESSION: Negative intracranial MRA. IMPRESSION: Normal intracranial MRV, no dural sinus thrombosis.   Earleen Newport, MD 09/01/19 [phone]

## 2019-09-01 NOTE — Progress Notes (Signed)
Name: Victoria Austin   MRN: [id number]    DOB: 1950-08-17   Date:09/01/2019       Progress Note  Subjective  Chief Complaint  No chief complaint on file.   I connected with  [name]  on 09/01/19 at  9:00 AM EST by a video enabled telemedicine application and verified that I am speaking with the correct person using two identifiers.  I discussed the limitations of evaluation and management by telemedicine and the availability of in person appointments. The patient expressed understanding and agreed to proceed. Staff also discussed with the patient that there may be a patient responsible charge related to this service. Patient Location: Home Provider Location: Office Additional Individuals present: None  HPI  Pt presents to discuss recent blood in nose in the mornings and new onset headache x4 days:   - Mild Epistaxis: She notes small blood clots when she blows her nose in the morning over the last 4 days.  She states once she is up for the day, she does not have blood in her nose.  She notes her home uses gas heat, keeps the temperature at 103F.  - New onset headache: She also notes headache for about 4 days - it is in the center of her forehead between the eyes and goes back about 4-5 inches.  She describes her pain as dull with intermittent significant increase in intensity.  - Denies: light/sound sensitivity, N/V/D, weakness, speech changes, no extremity weakness, no numbness/tingling, confusion.  - Endorses: Vision change - Tilting head to the right and having to tilt her book to read, also having blurred vision if she has both eyes open when trying to read. Headache pain worsens when eyes are open.  Chronic Vertigo - not worse recently. - History Temporal arteritis: She has not followed up with ophthalmology since last year.  States current headache is not at all similar to this.  - She is taking plavix for history CVA - has history of stroke in 2019 with two TIA's prior to the stroke.   She denies any residual effects aside from some occasional memory impairment. She also has HLD and takes statin therapy and Vascepa.  She has not been seeing neurology for follow up for prior stroke.  Has seen Cardiology  (last visit was for pre-op clearance) in October 2020 - had echo and stress test recommended, but she did not complete.   - Uses CPAP - She is compliant with CPAP and uses humidified air - would like referral for pulmonology for adjustments and monitoring.  - HTN: Not checking BP at home. Compliant with medication regimen.  Patient Active Problem List   Diagnosis Date Noted  . Lumbar degenerative disc disease 06/08/2019  . Lumbar facet arthropathy 06/08/2019  . Chronic pain syndrome 06/08/2019  . Capsulitis 05/20/2019  . Benign essential HTN 04/15/2019  . Constipation 04/13/2019  . Vertigo 04/13/2019  . Hypertriglyceridemia 04/13/2019  . 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 Diabetic neuropathy ([place]) 02/15/2019  . Cerebrovascular accident (CVA) ([place]) 01/08/2019  . Incontinence   . RA (rheumatoid arthritis) ([place])   . Temporal arteritis ([place]) 01/01/2019  . Goals of care, counseling/discussion 12/26/2018  . Albuminuria 12/18/2018  . Dyslipidemia associated with type 2 diabetes mellitus ([place]) 12/11/2018  . Compression fracture of L1 lumbar vertebra ([place]) 12/11/2018  . 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 Anxiety   . Depression   . Diverticulitis 05/05/2018  . Hypertensive disorder 05/05/2018  .  Fibromyalgia 03/21/2016  . Irritable bowel syndrome 09/19/2014  . GERD (gastroesophageal reflux disease) 04/03/2011  . Hiatal hernia 01/12/2007  . Bipolar 1 disorder ([place]) 07/29/2004    Social History   Tobacco Use  . Smoking status: Never Smoker  . Smokeless tobacco: Never Used  Substance Use Topics  . Alcohol use: Never     Current Outpatient Medications:  .  ADVAIR HFA [id number] MCG/ACT inhaler, Inhale 1 puff into the lungs  2 (two) times daily., Disp: 1 Inhaler, Rfl: 3 .  albuterol (PROAIR HFA) 108 (90 Base) MCG/ACT inhaler, Take 1 puff by mouth 2 (two) times daily as needed., Disp: , Rfl:  .  buPROPion (WELLBUTRIN) 100 MG tablet, Take 100 mg by mouth daily., Disp: , Rfl:  .  carbamazepine (TEGRETOL) 200 MG tablet, Take 1 tablet by mouth 3 (three) times daily., Disp: , Rfl:  .  cloNIDine (CATAPRES) 0.1 MG tablet, Take 0.1 mg by mouth daily as needed. , Disp: , Rfl:  .  clopidogrel (PLAVIX) 75 MG tablet, Take 1 tablet (75 mg total) by mouth daily., Disp: 90 tablet, Rfl: 1 .  Continuous Blood Gluc Receiver (FREESTYLE LIBRE 14 DAY READER) DEVI, 1 Device by Does not apply route 4 (four) times daily., Disp: 1 Device, Rfl: 0 .  Continuous Blood Gluc Sensor (FREESTYLE LIBRE 14 DAY SENSOR) MISC, 1 Device by Does not apply route every 14 (fourteen) days., Disp: 6 each, Rfl: 1 .  dapagliflozin propanediol (FARXIGA) 10 MG TABS tablet, Take 10 mg by mouth daily., Disp: , Rfl:  .  docusate sodium (COLACE) 100 MG capsule, Take 100 mg by mouth 2 (two) times daily., Disp: , Rfl:  .  DULoxetine (CYMBALTA) 60 MG capsule, Take 1 capsule by mouth daily., Disp: , Rfl:  .  EPINEPHrine (EPIPEN 2-PAK) 0.3 mg/0.3 mL IJ SOAJ injection, Inject 0.3 mLs (0.3 mg total) into the muscle as needed for anaphylaxis. Then call 911 for ER transport., Disp: 0.3 mL, Rfl: 1 .  gabapentin (NEURONTIN) 300 MG capsule, Take 2 capsules (600 mg total) by mouth at bedtime., Disp: 180 capsule, Rfl: 1 .  hydrALAZINE (APRESOLINE) 50 MG tablet, Take 1 tablet (50 mg total) by mouth 3 (three) times daily., Disp: 270 tablet, Rfl: 1 .  Icosapent Ethyl 1 g CAPS, Take 2 capsules (2 g total) by mouth 2 (two) times daily., Disp: 360 capsule, Rfl: 3 .  Insulin Glargine (LANTUS SOLOSTAR) 100 UNIT/ML Solostar Pen, Inject 85 Units into the skin daily., Disp: 10 pen, Rfl: 3 .  lisinopril (ZESTRIL) 10 MG tablet, , Disp: , Rfl:  .  loratadine (CLARITIN) 10 MG tablet, Take 1 tablet  (10 mg total) by mouth daily., Disp: 90 tablet, Rfl: 1 .  LORazepam (ATIVAN) 1 MG tablet, Take 1 tablet by mouth daily., Disp: , Rfl:  .  MAGNESIUM-OXIDE 400 (241.3 Mg) MG tablet, Take 1 tablet (400 mg total) by mouth daily., Disp: 90 tablet, Rfl: 1 .  meclizine (ANTIVERT) 25 MG tablet, Take 1 tablet (25 mg total) by mouth 3 (three) times daily as needed for dizziness., Disp: 180 tablet, Rfl: 1 .  methocarbamol (ROBAXIN) 750 MG tablet, Take 1 tablet (750 mg total) by mouth 2 (two) times daily as needed for muscle spasms., Disp: 180 tablet, Rfl: 1 .  mirabegron ER (MYRBETRIQ) 50 MG TB24 tablet, Take 1 tablet (50 mg total) by mouth daily., Disp: 90 tablet, Rfl: 3 .  montelukast (SINGULAIR) 10 MG tablet, Take 1 tablet (10 mg total) by mouth daily.,  Disp: 90 tablet, Rfl: 0 .  NIFEdipine (ADALAT CC) 60 MG 24 hr tablet, Take 1 tablet (60 mg total) by mouth 2 (two) times daily., Disp: 180 tablet, Rfl: 1 .  NOVOLOG FLEXPEN 100 UNIT/ML FlexPen, Inject 14 Units into the skin 3 (three) times daily. With meals, Disp: 3 pen, Rfl: 3 .  pantoprazole (PROTONIX) 40 MG tablet, Take 1 tablet (40 mg total) by mouth daily., Disp: 90 tablet, Rfl: 1 .  primidone (MYSOLINE) 250 MG tablet, Take 1 tablet (250 mg total) by mouth at bedtime., Disp: 90 tablet, Rfl: 1 .  promethazine (PHENERGAN) 25 MG tablet, Take 1 tablet by mouth every 6 (six) hours as needed. , Disp: , Rfl:  .  propranolol (INDERAL) 10 MG tablet, Take 1 tablet (10 mg total) by mouth 2 (two) times daily., Disp: 180 tablet, Rfl: 1 .  rosuvastatin (CRESTOR) 20 MG tablet, Take 1 tablet (20 mg total) by mouth daily., Disp: 90 tablet, Rfl: 3 .  SURE COMFORT PEN NEEDLES 31G X 5 MM MISC, Inject 1 pen as directed 5 (five) times daily as needed., Disp: 100 each, Rfl: 3 .  traZODone (DESYREL) 50 MG tablet, Take 1 tablet by mouth at bedtime., Disp: , Rfl:  .  trimethoprim (TRIMPEX) 100 MG tablet, Take 1 tablet (100 mg total) by mouth daily., Disp: 90 tablet, Rfl: 3 .   Vitamin D, Ergocalciferol, (DRISDOL) 1.25 MG (50000 UT) CAPS capsule, Take 1 capsule (50,000 Units total) by mouth once a week., Disp: 12 capsule, Rfl: 1  Allergies  Allergen Reactions  . Ciprofloxacin Nausea And Vomiting  . Cyclobenzaprine Nausea And Vomiting  . Diazepam Nausea And Vomiting  . Ketorolac     Other reaction(s): Unknown  . Mushroom Extract Complex   . Neomycin-Polymyxin-Gramicidin   . Ranitidine Hcl   . Ondansetron Nausea And Vomiting    I personally reviewed active problem list, medication list, allergies, notes from last encounter, lab results with the patient/caregiver today.  ROS  Ten systems reviewed and is negative except as mentioned in HPI  Objective  Virtual encounter, vitals not obtained.  There is no height or weight on file to calculate BMI.  Nursing Note and Vital Signs reviewed.  Physical Exam  Pulmonary/Chest: Effort normal. No respiratory distress. Speaking in complete sentences Neurological: Pt is alert and oriented to person, place, and time. Speech is normal Psychiatric: Patient has a normal mood and affect. behavior is normal. Judgment and thought content normal.  No results found for this or any previous visit (from the past 72 hour(s)).  Assessment & Plan  1. History of cerebrovascular accident (CVA) due to ischemia 2. Vision changes 3. History of temporal arteritis 4. Anticoagulant long-term use 5. Benign essential HTN - Patient has new onset acute headache with significant vision changes x4 days; she is anticoagulated on plavix with report of mild epistaxis.  She has multiple risk factors for CVA including history multiple TIA's, hx ischemic stroke, HTN, HLD.  I discussed these risks with the patient, the need for more immediate care than can be provided as an outpatient, and my recommendation for emergency care given her significant visual changes and risk factors.  She is agreeable and will have her daughter drive her immediately  after our appointment to the [facility] ER.  Declines EMS transport.  6. OSA on CPAP - Ambulatory referral to Pulmonology  - I discussed the assessment and treatment plan with the patient. The patient was provided an opportunity to ask questions and all  were answered. The patient agreed with the plan and demonstrated an understanding of the instructions.  I provided 26 minutes of non-face-to-face time during this encounter.  Hubbard Hartshorn, FNP

## 2019-09-01 NOTE — ED Triage Notes (Addendum)
Pt states she was sent from her PCP for futher eval of having HA with daily nose bleeds, increased dizziness from baseline vertigo hx and generalized fatigue for the past 4-5 days. Pt is a/ox4, pt is unsteady with ambulation

## 2019-09-01 NOTE — ED Notes (Signed)
Transported to MRI

## 2019-09-02 ENCOUNTER — Encounter: Payer: Self-pay | Admitting: Family Medicine

## 2019-09-03 ENCOUNTER — Other Ambulatory Visit: Payer: Self-pay | Admitting: *Deleted

## 2019-09-03 MED ORDER — VITAMIN D (ERGOCALCIFEROL) 1.25 MG (50000 UNIT) PO CAPS: 50000.0000 [IU] | ORAL_CAPSULE | ORAL | 1 refills | Status: DC

## 2019-09-03 MED ORDER — TRIMETHOPRIM 100 MG PO TABS: 100.0000 mg | ORAL_TABLET | Freq: Every day | ORAL | 3 refills | Status: DC

## 2019-09-03 MED ORDER — MIRABEGRON ER 50 MG PO TB24: 50.0000 mg | ORAL_TABLET | Freq: Every day | ORAL | 3 refills | Status: DC

## 2019-09-03 NOTE — Telephone Encounter (Signed)
Patient called to request refills for Trimethoprim and Myrbetriq be sent to [facility]. Sent in as requested.

## 2019-09-09 DIAGNOSIS — S32010G Wedge compression fracture of first lumbar vertebra, subsequent encounter for fracture with delayed healing: Secondary | ICD-10-CM | POA: Diagnosis not present

## 2019-09-09 DIAGNOSIS — S32010A Wedge compression fracture of first lumbar vertebra, initial encounter for closed fracture: Secondary | ICD-10-CM | POA: Diagnosis not present

## 2019-09-09 DIAGNOSIS — M419 Scoliosis, unspecified: Secondary | ICD-10-CM | POA: Diagnosis not present

## 2019-09-13 ENCOUNTER — Encounter: Payer: Self-pay | Admitting: Family Medicine

## 2019-09-13 DIAGNOSIS — F3132 Bipolar disorder, current episode depressed, moderate: Secondary | ICD-10-CM | POA: Diagnosis not present

## 2019-09-13 DIAGNOSIS — F411 Generalized anxiety disorder: Secondary | ICD-10-CM | POA: Diagnosis not present

## 2019-09-19 ENCOUNTER — Other Ambulatory Visit: Payer: Self-pay

## 2019-09-19 ENCOUNTER — Emergency Department
Admission: EM | Admit: 2019-09-19 | Discharge: 2019-09-19 | Disposition: A | Discharge status: Home / Self Care | Payer: Medicare Other | Attending: Emergency Medicine | Admitting: Emergency Medicine

## 2019-09-19 ENCOUNTER — Emergency Department: Payer: Medicare Other

## 2019-09-19 DIAGNOSIS — Z79899 Other long term (current) drug therapy: Secondary | ICD-10-CM | POA: Diagnosis not present

## 2019-09-19 DIAGNOSIS — R52 Pain, unspecified: Secondary | ICD-10-CM | POA: Diagnosis not present

## 2019-09-19 DIAGNOSIS — S51811A Laceration without foreign body of right forearm, initial encounter: Secondary | ICD-10-CM

## 2019-09-19 DIAGNOSIS — S0990XA Unspecified injury of head, initial encounter: Secondary | ICD-10-CM | POA: Insufficient documentation

## 2019-09-19 DIAGNOSIS — E119 Type 2 diabetes mellitus without complications: Secondary | ICD-10-CM | POA: Diagnosis not present

## 2019-09-19 DIAGNOSIS — Z794 Long term (current) use of insulin: Secondary | ICD-10-CM | POA: Insufficient documentation

## 2019-09-19 DIAGNOSIS — W01190A Fall on same level from slipping, tripping and stumbling with subsequent striking against furniture, initial encounter: Secondary | ICD-10-CM | POA: Diagnosis not present

## 2019-09-19 DIAGNOSIS — S59911A Unspecified injury of right forearm, initial encounter: Secondary | ICD-10-CM | POA: Diagnosis not present

## 2019-09-19 DIAGNOSIS — W19XXXA Unspecified fall, initial encounter: Secondary | ICD-10-CM | POA: Diagnosis not present

## 2019-09-19 DIAGNOSIS — I10 Essential (primary) hypertension: Secondary | ICD-10-CM | POA: Insufficient documentation

## 2019-09-19 DIAGNOSIS — Z8571 Personal history of Hodgkin lymphoma: Secondary | ICD-10-CM | POA: Insufficient documentation

## 2019-09-19 DIAGNOSIS — Y92012 Bathroom of single-family (private) house as the place of occurrence of the external cause: Secondary | ICD-10-CM | POA: Diagnosis not present

## 2019-09-19 DIAGNOSIS — S299XXA Unspecified injury of thorax, initial encounter: Secondary | ICD-10-CM | POA: Diagnosis not present

## 2019-09-19 DIAGNOSIS — M7989 Other specified soft tissue disorders: Secondary | ICD-10-CM | POA: Diagnosis not present

## 2019-09-19 DIAGNOSIS — Y9301 Activity, walking, marching and hiking: Secondary | ICD-10-CM | POA: Insufficient documentation

## 2019-09-19 DIAGNOSIS — Y998 Other external cause status: Secondary | ICD-10-CM | POA: Insufficient documentation

## 2019-09-19 DIAGNOSIS — S20211A Contusion of right front wall of thorax, initial encounter: Secondary | ICD-10-CM | POA: Insufficient documentation

## 2019-09-19 DIAGNOSIS — S99921A Unspecified injury of right foot, initial encounter: Secondary | ICD-10-CM | POA: Diagnosis not present

## 2019-09-19 DIAGNOSIS — S199XXA Unspecified injury of neck, initial encounter: Secondary | ICD-10-CM | POA: Diagnosis not present

## 2019-09-19 DIAGNOSIS — R0902 Hypoxemia: Secondary | ICD-10-CM | POA: Diagnosis not present

## 2019-09-19 MED ORDER — OXYCODONE-ACETAMINOPHEN 5-325 MG PO TABS
1.0000 | ORAL_TABLET | Freq: Once | ORAL | Status: AC
  Administered 2019-09-19: 1 via ORAL
  Filled 2019-09-19: qty 1

## 2019-09-19 MED ORDER — ACETAMINOPHEN 325 MG PO TABS
650.0000 mg | ORAL_TABLET | Freq: Once | ORAL | Status: AC
  Administered 2019-09-19: 10:00:00 650 mg via ORAL
  Filled 2019-09-19: qty 2

## 2019-09-19 NOTE — ED Triage Notes (Signed)
Pt arrives via EMS after mechanical fall over shower chair.  Pt states she hit her head. No bumps of bruises on head.  Pt complains of right rib pain and laceration on right forearm. Injured right first toe. No LOC.  Alert and oriented x4.

## 2019-09-19 NOTE — Discharge Instructions (Signed)
Your CT scans of the head and neck today were okay.  Your x-rays show a small fracture of the tip of your right second toe.  Keep this toe splinted to minimize pain and allow it to heal.  Follow-up with podiatry for further assessment of the toe injury.  We do not find any other significant injuries.  Take Tylenol for pain, and use ice intermittently over the next 24 hours to help reduce inflammation, pain, and swelling.

## 2019-09-19 NOTE — ED Notes (Signed)
Pt states she is ready for pain medicine. Pain level is at an 8 and "heading to a 10."   EDP made aware.

## 2019-09-19 NOTE — ED Notes (Signed)
Pt skin tear cleaned with normal saline. Petroleum Xenoform placed on wound and covered by  2 4x4 gauze. Dressing wrapped with gauze.

## 2019-09-19 NOTE — ED Provider Notes (Signed)
[facility] Emergency Department Provider Note  ____________________________________________  Time seen: Approximately 10:00 AM  I have reviewed the triage vital signs and the nursing notes.   HISTORY  Chief Complaint Fall    HPI Victoria Austin is a 69 y.o. female with a history of bipolar disorder, diabetes, GERD, Hodgkin's lymphoma who comes the ED complaining of a mechanical fall.  There was a shower chair in her bathroom outside the shower and she tripped over it causing her to fall onto her right side.  She complains of pain in the right forearm, right chest wall, right second toe.  She states she did hit her head on the ground and takes Plavix.  No shortness of breath or abdominal pain.  No loss of consciousness.  Last tetanus shot was approximately 5 months ago.      Past Medical History:  Diagnosis Date  . Anxiety   . Arthritis   . Bipolar 1 disorder ([place]) 2006  . Depression   . Diabetes mellitus without complication ([place])   . Fibromyalgia   . GERD (gastroesophageal reflux disease)   . Hodgkin's lymphoma ([place])   . Hyperlipidemia   . IBS (irritable bowel syndrome)   . Incontinence   . RA (rheumatoid arthritis) ([place])   . Stroke [facility]) 2019     Patient Active Problem List   Diagnosis Date Noted  . OSA on CPAP 09/01/2019  . Anticoagulant long-term use 09/01/2019  . Lumbar degenerative disc disease 06/08/2019  . Lumbar facet arthropathy 06/08/2019  . Chronic pain syndrome 06/08/2019  . Capsulitis 05/20/2019  . Benign essential HTN 04/15/2019  . Constipation 04/13/2019  . Vertigo 04/13/2019  . Hypertriglyceridemia 04/13/2019  . 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 Diabetic neuropathy ([place]) 02/15/2019  . Cerebrovascular accident (CVA) ([place]) 01/08/2019  . Incontinence   . RA (rheumatoid arthritis) ([place])   . Temporal arteritis ([place]) 01/01/2019  . Goals of care, counseling/discussion 12/26/2018  . Albuminuria 12/18/2018  . Dyslipidemia associated  with type 2 diabetes mellitus ([place]) 12/11/2018  . Compression fracture of L1 lumbar vertebra ([place]) 12/11/2018  . 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 Anxiety   . Depression   . Diverticulitis 05/05/2018  . Hypertensive disorder 05/05/2018  . Fibromyalgia 03/21/2016  . Irritable bowel syndrome 09/19/2014  . GERD (gastroesophageal reflux disease) 04/03/2011  . Hiatal hernia 01/12/2007  . Bipolar 1 disorder ([place]) 07/29/2004     Past Surgical History:  Procedure Laterality Date  . BACK SURGERY    . CESAREAN SECTION    . ELBOW ARTHROSCOPY    . FINGER SURGERY    . HAND SURGERY    . HERNIA REPAIR    . SPINE SURGERY       Prior to Admission medications   Medication Sig Start Date End Date Taking? Authorizing Provider  ADVAIR HFA 230-21 MCG/ACT inhaler Inhale 1 puff into the lungs 2 (two) times daily. 01/01/19   Hubbard Hartshorn, FNP  albuterol (PROAIR HFA) 108 (90 Base) MCG/ACT inhaler Take 1 puff by mouth 2 (two) times daily as needed. 04/27/18   [provider]  alendronate (FOSAMAX) 70 MG tablet Take 1 tablet by mouth once a week. 08/25/19 08/24/20  [provider]  buPROPion (WELLBUTRIN) 100 MG tablet Take 100 mg by mouth daily. 07/10/18   [provider]  carbamazepine (TEGRETOL) 200 MG tablet Take 1 tablet by mouth 3 (three) times daily. 04/27/18   [provider]  cloNIDine (CATAPRES) 0.1  MG tablet Take 0.1 mg by mouth daily as needed.  04/27/18   [provider]  clopidogrel (PLAVIX) 75 MG tablet Take 1 tablet (75 mg total) by mouth daily. 08/05/19   Hubbard Hartshorn, FNP  Continuous Blood Gluc Receiver (FREESTYLE LIBRE 14 DAY READER) DEVI 1 Device by Does not apply route 4 (four) times daily. 12/30/18   Hubbard Hartshorn, FNP  Continuous Blood Gluc Sensor (FREESTYLE LIBRE 14 DAY SENSOR) MISC 1 Device by Does not apply route every 14 (fourteen) days. 12/30/18   Hubbard Hartshorn, FNP  dapagliflozin  propanediol (FARXIGA) 10 MG TABS tablet Take 10 mg by mouth daily.    [provider]  docusate sodium (COLACE) 100 MG capsule Take 100 mg by mouth 2 (two) times daily.    [provider]  DULoxetine (CYMBALTA) 60 MG capsule Take 1 capsule by mouth daily. 04/23/18   [provider]  EPINEPHrine (EPIPEN 2-PAK) 0.3 mg/0.3 mL IJ SOAJ injection Inject 0.3 mLs (0.3 mg total) into the muscle as needed for anaphylaxis. Then call 911 for ER transport. 01/01/19   Hubbard Hartshorn, FNP  gabapentin (NEURONTIN) 300 MG capsule Take 2 capsules (600 mg total) by mouth at bedtime. 08/05/19   Hubbard Hartshorn, FNP  hydrALAZINE (APRESOLINE) 50 MG tablet Take 1 tablet (50 mg total) by mouth 3 (three) times daily. 03/19/19   Hubbard Hartshorn, FNP  Icosapent Ethyl 1 g CAPS Take 2 capsules (2 g total) by mouth 2 (two) times daily. 03/04/19   Hubbard Hartshorn, FNP  Insulin Glargine (LANTUS SOLOSTAR) 100 UNIT/ML Solostar Pen Inject 85 Units into the skin daily. 06/22/19   Hubbard Hartshorn, FNP  lisinopril (ZESTRIL) 5 MG tablet Take 1 tablet by mouth daily. 08/25/19   [provider]  loratadine (CLARITIN) 10 MG tablet Take 1 tablet (10 mg total) by mouth daily. 01/01/19   Hubbard Hartshorn, FNP  LORazepam (ATIVAN) 1 MG tablet Take 1 tablet by mouth daily. 06/15/18   [provider]  MAGNESIUM-OXIDE 400 (241.3 Mg) MG tablet Take 1 tablet (400 mg total) by mouth daily. 08/12/19   Hubbard Hartshorn, FNP  meclizine (ANTIVERT) 25 MG tablet Take 1 tablet (25 mg total) by mouth 3 (three) times daily as needed for dizziness. 08/05/19   Hubbard Hartshorn, FNP  methocarbamol (ROBAXIN) 750 MG tablet Take 1 tablet (750 mg total) by mouth 2 (two) times daily as needed for muscle spasms. 04/13/19   Hubbard Hartshorn, FNP  mirabegron ER (MYRBETRIQ) 50 MG TB24 tablet Take 1 tablet (50 mg total) by mouth daily. 09/03/19   Billey Co, MD  montelukast (SINGULAIR) 10 MG tablet Take 1 tablet (10 mg total) by mouth daily. 01/01/19    Hubbard Hartshorn, FNP  NIFEdipine (ADALAT CC) 60 MG 24 hr tablet Take 1 tablet (60 mg total) by mouth 2 (two) times daily. 08/05/19   Hubbard Hartshorn, FNP  NOVOLOG FLEXPEN 100 UNIT/ML FlexPen Inject 14 Units into the skin 3 (three) times daily. With meals 06/22/19   Hubbard Hartshorn, FNP  pantoprazole (PROTONIX) 40 MG tablet Take 1 tablet (40 mg total) by mouth daily. 08/05/19   Hubbard Hartshorn, FNP  primidone (MYSOLINE) 250 MG tablet Take 1 tablet (250 mg total) by mouth at bedtime. 08/12/19   Hubbard Hartshorn, FNP  promethazine (PHENERGAN) 25 MG tablet Take 1 tablet by mouth every 6 (six) hours as needed.  07/09/18   [provider]  propranolol (INDERAL) 10 MG tablet Take 1 tablet (10 mg total) by mouth 2 (two) times daily. 08/05/19   Hubbard Hartshorn, FNP  rosuvastatin (CRESTOR) 20 MG tablet Take 1 tablet (20 mg total) by mouth daily. 08/05/19   Hubbard Hartshorn, FNP  SURE COMFORT PEN NEEDLES 31G X 5 MM MISC Inject 1 pen as directed 5 (five) times daily as needed. 03/19/19   Hubbard Hartshorn, FNP  traZODone (DESYREL) 50 MG tablet Take 1 tablet by mouth at bedtime. 04/27/18   [provider]  trimethoprim (TRIMPEX) 100 MG tablet Take 1 tablet (100 mg total) by mouth daily. 09/03/19   Billey Co, MD  Vitamin D, Ergocalciferol, (DRISDOL) 1.25 MG (50000 UNIT) CAPS capsule Take 1 capsule (50,000 Units total) by mouth once a week. 09/03/19   Hubbard Hartshorn, FNP     Allergies Ciprofloxacin, Cyclobenzaprine, Diazepam, Ketorolac, Mushroom extract complex, Neomycin-polymyxin-gramicidin, Ranitidine hcl, and Ondansetron   Family History  Problem Relation Age of Onset  . Lung cancer Mother   . Hypertension Mother   . Hypertension Sister   . Hypertension Brother   . Bone cancer Maternal Aunt   . Colon cancer Maternal Uncle     Social History Social History   Tobacco Use  . Smoking status: Never Smoker  . Smokeless tobacco: Never Used  Substance Use Topics  . Alcohol use: Never  . Drug use:  Never    Review of Systems  Constitutional:   No fever or chills.  ENT:   No sore throat. No rhinorrhea. Cardiovascular:   No chest pain or syncope. Respiratory:   No dyspnea or cough. Gastrointestinal:   Negative for abdominal pain, vomiting and diarrhea.  Musculoskeletal:   Positive chest wall pain, right toe pain, right forearm pain.  No neck pain. All other systems reviewed and are negative except as documented above in ROS and HPI.  ____________________________________________   PHYSICAL EXAM:  VITAL SIGNS: ED Triage Vitals  Enc Vitals Group     BP 09/19/19 0803 (!) 151/81     Pulse Rate 09/19/19 0803 85     Resp 09/19/19 0803 16     Temp 09/19/19 0803 (!) 97.4 F (36.3 C)     Temp Source 09/19/19 0803 Oral     SpO2 09/19/19 0755 96 %     Weight 09/19/19 0805 185 lb (83.9 kg)     Height 09/19/19 0805 5\' 3"  (1.6 m)     Head Circumference --      Peak Flow --      Pain Score 09/19/19 0804 6     Pain Loc --      Pain Edu? --      Excl. in [place]? --     Vital signs reviewed, nursing assessments reviewed.   Constitutional:   Alert and oriented. Non-toxic appearance. Eyes:   Conjunctivae are normal. EOMI. PERRL. ENT      Head:   Normocephalic and atraumatic.      Nose:   No epistaxis      Mouth/Throat:   No intraoral injury      Neck:   No meningismus. Full ROM.  No midline spinal tenderness Hematological/Lymphatic/Immunilogical:   No cervical lymphadenopathy. Cardiovascular:   RRR. Symmetric bilateral radial and DP pulses.  No murmurs. Cap refill less than 2 seconds. Respiratory:   Normal respiratory effort without tachypnea/retractions. Breath sounds are clear and equal bilaterally. No wheezes/rales/rhonchi. Gastrointestinal:   Soft and nontender. Non distended. There is no CVA  tenderness.  No rebound, rigidity, or guarding.  Musculoskeletal:   Normal range of motion in all extremities. No joint effusions.  Focal tenderness at distal right second toe without  deformity or laceration.  Diffuse tenderness over the right lateral chest wall without crepitus or rib instability.  There is swelling over the right distal forearm with a 2 cm superficial skin tear.  No laceration, no deformity.  Hips and pelvis stable. Neurologic:   Normal speech and language.  Motor grossly intact. No acute focal neurologic deficits are appreciated.  Skin:    Skin is warm, dry and intact. No rash noted.  No petechiae, purpura, or bullae.  ____________________________________________    LABS (pertinent positives/negatives) (all labs ordered are listed, but only abnormal results are displayed) Labs Reviewed - No data to display ____________________________________________   EKG    ____________________________________________    RADIOLOGY  DG Chest 2 View  Result Date: 09/19/2019 CLINICAL DATA:  Right chest wall pain after fall EXAM: CHEST - 2 VIEW COMPARISON:  None. FINDINGS: Calcified granuloma in the left lung. There is no edema, consolidation, effusion, or pneumothorax. No visible rib fracture. Normal heart size and mediastinal contours. Spondylosis. IMPRESSION: No evidence of injury. Electronically Signed   By: Monte Fantasia M.D.   On: 09/19/2019 09:12   DG Forearm Right  Result Date: 09/19/2019 CLINICAL DATA:  Right forearm swelling after fall EXAM: RIGHT FOREARM - 2 VIEW COMPARISON:  None. FINDINGS: No fracture. No focal osseous lesions. No evidence of malalignment at the right elbow or right wrist on these views. No radiopaque foreign body. IMPRESSION: No fracture. Electronically Signed   By: Ilona Sorrel M.D.   On: 09/19/2019 09:13   CT HEAD WO CONTRAST  Result Date: 09/19/2019 CLINICAL DATA:  Mechanical fall over shower chair with head injury. EXAM: CT HEAD WITHOUT CONTRAST CT CERVICAL SPINE WITHOUT CONTRAST TECHNIQUE: Multidetector CT imaging of the head and cervical spine was performed following the standard protocol without intravenous contrast.  Multiplanar CT image reconstructions of the cervical spine were also generated. COMPARISON:  09/01/2019 head CT. 12/29/2018 head and cervical spine CT. FINDINGS: CT HEAD FINDINGS Brain: No evidence of parenchymal hemorrhage or extra-axial fluid collection. No mass lesion, mass effect, or midline shift. No CT evidence of acute infarction. Nonspecific subcortical and periventricular white matter hypodensity, most in keeping with chronic small vessel ischemic change. Cerebral volume is age appropriate. No ventriculomegaly. Vascular: No acute abnormality. Skull: No evidence of calvarial fracture. Sinuses/Orbits: The visualized paranasal sinuses are essentially clear. Other:  The mastoid air cells are unopacified. CT CERVICAL SPINE FINDINGS Alignment: Straightening of the cervical spine. No facet subluxation. Dens is well positioned between the lateral masses of C1. Skull base and vertebrae: No acute fracture. No primary bone lesion or focal pathologic process. Soft tissues and spinal canal: No prevertebral edema. No visible canal hematoma. Disc levels: Moderate to marked multilevel degenerative changes in the cervical spine, most prominent at C6-7. Moderate bilateral facet arthropathy. No significant degenerative foraminal stenosis. Upper chest: No acute abnormality. Other: Visualized mastoid air cells appear clear. No discrete thyroid nodules. No pathologically enlarged cervical nodes. IMPRESSION: 1. No evidence of acute intracranial abnormality. No evidence of calvarial fracture. 2. Mild chronic small vessel ischemic changes in the cerebral white matter. 3. No cervical spine fracture or subluxation. 4. Moderate to marked multilevel degenerative changes in the cervical spine as detailed. Electronically Signed   By: Ilona Sorrel M.D.   On: 09/19/2019 09:11   CT CERVICAL SPINE WO  CONTRAST  Result Date: 09/19/2019 CLINICAL DATA:  Mechanical fall over shower chair with head injury. EXAM: CT HEAD WITHOUT CONTRAST CT  CERVICAL SPINE WITHOUT CONTRAST TECHNIQUE: Multidetector CT imaging of the head and cervical spine was performed following the standard protocol without intravenous contrast. Multiplanar CT image reconstructions of the cervical spine were also generated. COMPARISON:  09/01/2019 head CT. 12/29/2018 head and cervical spine CT. FINDINGS: CT HEAD FINDINGS Brain: No evidence of parenchymal hemorrhage or extra-axial fluid collection. No mass lesion, mass effect, or midline shift. No CT evidence of acute infarction. Nonspecific subcortical and periventricular white matter hypodensity, most in keeping with chronic small vessel ischemic change. Cerebral volume is age appropriate. No ventriculomegaly. Vascular: No acute abnormality. Skull: No evidence of calvarial fracture. Sinuses/Orbits: The visualized paranasal sinuses are essentially clear. Other:  The mastoid air cells are unopacified. CT CERVICAL SPINE FINDINGS Alignment: Straightening of the cervical spine. No facet subluxation. Dens is well positioned between the lateral masses of C1. Skull base and vertebrae: No acute fracture. No primary bone lesion or focal pathologic process. Soft tissues and spinal canal: No prevertebral edema. No visible canal hematoma. Disc levels: Moderate to marked multilevel degenerative changes in the cervical spine, most prominent at C6-7. Moderate bilateral facet arthropathy. No significant degenerative foraminal stenosis. Upper chest: No acute abnormality. Other: Visualized mastoid air cells appear clear. No discrete thyroid nodules. No pathologically enlarged cervical nodes. IMPRESSION: 1. No evidence of acute intracranial abnormality. No evidence of calvarial fracture. 2. Mild chronic small vessel ischemic changes in the cerebral white matter. 3. No cervical spine fracture or subluxation. 4. Moderate to marked multilevel degenerative changes in the cervical spine as detailed. Electronically Signed   By: Ilona Sorrel M.D.   On:  09/19/2019 09:11   DG Toe 2nd Right  Result Date: 09/19/2019 CLINICAL DATA:  Second toe pain after fall EXAM: RIGHT SECOND TOE COMPARISON:  None. FINDINGS: Possible avulsion fracture of the dorsal base of the distal phalanx. There is flexion of the digit which limits oblique view. No opaque foreign body. IMPRESSION: Possible extensor avulsion fracture from the distal phalanx. Electronically Signed   By: Monte Fantasia M.D.   On: 09/19/2019 09:13    ____________________________________________   PROCEDURES Procedures  ____________________________________________  DIFFERENTIAL DIAGNOSIS   Intracranial hemorrhage, C-spine fracture, rib fracture, pneumothorax, toe fracture, forearm fracture  CLINICAL IMPRESSION / ASSESSMENT AND PLAN / ED COURSE  Medications ordered in the ED: Medications  oxyCODONE-acetaminophen (PERCOCET/ROXICET) 5-325 MG per tablet 1 tablet (1 tablet Oral Given 09/19/19 0942)  acetaminophen (TYLENOL) tablet 650 mg (650 mg Oral Given 09/19/19 [id number])    Pertinent labs & imaging results that were available during my care of the patient were reviewed by me and considered in my medical decision making (see chart for details).  Victoria Austin was evaluated in Emergency Department on 09/19/2019 for the symptoms described in the history of present illness. She was evaluated in the context of the global COVID-19 pandemic, which necessitated consideration that the patient might be at risk for infection with the SARS-CoV-2 virus that causes COVID-19. Institutional protocols and algorithms that pertain to the evaluation of patients at risk for COVID-19 are in a state of rapid change based on information released by regulatory bodies including the CDC and federal and state organizations. These policies and algorithms were followed during the patient's care in the ED.   Patient presents after mechanical fall at home.  Imaging obtained to evaluate for traumatic injuries.  CT is negative for  intracranial hemorrhage or C-spine fracture.  X-rays reveal a possible injury to the distal right second toe which will be splinted with aluminum foam.  No forearm fracture or significant rib or lung injury.  Stable for discharge home to take Tylenol for pain, use ice, follow-up with primary care.      ____________________________________________   FINAL CLINICAL IMPRESSION(S) / ED DIAGNOSES    Final diagnoses:  Fall in home, initial encounter  Chest wall contusion, right, initial encounter  Skin tear of forearm without complication, right, initial encounter     ED Discharge Orders    None      Portions of this note were generated with dragon dictation software. Dictation errors may occur despite best attempts at proofreading.   Carrie Mew, MD 09/19/19 1003

## 2019-09-19 NOTE — ED Notes (Signed)
Pt offered pain medicine and declined at this time. Pt will notify staff if pain medication is needed.

## 2019-09-20 DIAGNOSIS — F3132 Bipolar disorder, current episode depressed, moderate: Secondary | ICD-10-CM | POA: Diagnosis not present

## 2019-09-20 DIAGNOSIS — F411 Generalized anxiety disorder: Secondary | ICD-10-CM | POA: Diagnosis not present

## 2019-09-21 ENCOUNTER — Encounter: Payer: Self-pay | Admitting: Family Medicine

## 2019-09-21 ENCOUNTER — Ambulatory Visit
Admission: RE | Admit: 2019-09-21 | Discharge: 2019-09-21 | Disposition: A | Discharge status: Home / Self Care | Payer: Medicare Other | Source: Ambulatory Visit | Attending: Family Medicine | Admitting: Family Medicine

## 2019-09-21 ENCOUNTER — Ambulatory Visit (INDEPENDENT_AMBULATORY_CARE_PROVIDER_SITE_OTHER): Payer: Medicare Other | Admitting: Family Medicine

## 2019-09-21 ENCOUNTER — Other Ambulatory Visit: Payer: Self-pay

## 2019-09-21 VITALS — BP 134/82 | HR 83 | Temp 97.1°F | Resp 20 | Ht 64.0 in | Wt 182.9 lb

## 2019-09-21 DIAGNOSIS — E538 Deficiency of other specified B group vitamins: Secondary | ICD-10-CM

## 2019-09-21 DIAGNOSIS — R109 Unspecified abdominal pain: Secondary | ICD-10-CM | POA: Diagnosis not present

## 2019-09-21 DIAGNOSIS — S2241XA Multiple fractures of ribs, right side, initial encounter for closed fracture: Secondary | ICD-10-CM | POA: Diagnosis not present

## 2019-09-21 DIAGNOSIS — E1165 Type 2 diabetes mellitus with hyperglycemia: Secondary | ICD-10-CM | POA: Diagnosis not present

## 2019-09-21 DIAGNOSIS — M25511 Pain in right shoulder: Secondary | ICD-10-CM | POA: Diagnosis not present

## 2019-09-21 DIAGNOSIS — S20211A Contusion of right front wall of thorax, initial encounter: Secondary | ICD-10-CM | POA: Insufficient documentation

## 2019-09-21 DIAGNOSIS — M25521 Pain in right elbow: Secondary | ICD-10-CM | POA: Diagnosis not present

## 2019-09-21 DIAGNOSIS — R0781 Pleurodynia: Secondary | ICD-10-CM

## 2019-09-21 DIAGNOSIS — R140 Abdominal distension (gaseous): Secondary | ICD-10-CM

## 2019-09-21 DIAGNOSIS — Z794 Long term (current) use of insulin: Secondary | ICD-10-CM | POA: Diagnosis not present

## 2019-09-21 DIAGNOSIS — Z09 Encounter for follow-up examination after completed treatment for conditions other than malignant neoplasm: Secondary | ICD-10-CM

## 2019-09-21 DIAGNOSIS — S59901A Unspecified injury of right elbow, initial encounter: Secondary | ICD-10-CM | POA: Diagnosis not present

## 2019-09-21 DIAGNOSIS — W19XXXD Unspecified fall, subsequent encounter: Secondary | ICD-10-CM

## 2019-09-21 DIAGNOSIS — M25811 Other specified joint disorders, right shoulder: Secondary | ICD-10-CM | POA: Insufficient documentation

## 2019-09-21 DIAGNOSIS — W19XXXA Unspecified fall, initial encounter: Secondary | ICD-10-CM | POA: Diagnosis not present

## 2019-09-21 DIAGNOSIS — S3991XA Unspecified injury of abdomen, initial encounter: Secondary | ICD-10-CM | POA: Diagnosis not present

## 2019-09-21 DIAGNOSIS — M549 Dorsalgia, unspecified: Secondary | ICD-10-CM | POA: Diagnosis not present

## 2019-09-21 MED ORDER — CYANOCOBALAMIN 1000 MCG/ML IJ SOLN
1000.0000 ug | Freq: Once | INTRAMUSCULAR | Status: AC
  Administered 2019-09-21: 14:00:00 1000 ug via INTRAMUSCULAR

## 2019-09-21 MED ORDER — PROMETHAZINE HCL 25 MG PO TABS: 25.0000 mg | ORAL_TABLET | Freq: Four times a day (QID) | ORAL | 1 refills | Status: DC | PRN

## 2019-09-21 MED ORDER — TIZANIDINE HCL 4 MG PO TABS: 2.0000 mg | ORAL_TABLET | Freq: Three times a day (TID) | ORAL | 0 refills | Status: DC | PRN

## 2019-09-21 MED ORDER — EPINEPHRINE 0.3 MG/0.3ML IJ SOAJ: 0.3000 mg | INTRAMUSCULAR | 1 refills | Status: AC | PRN

## 2019-09-21 MED ORDER — ADVAIR HFA 230-21 MCG/ACT IN AERO: 1.0000 | INHALATION_SPRAY | Freq: Two times a day (BID) | RESPIRATORY_TRACT | 3 refills | Status: DC

## 2019-09-21 MED ORDER — CLONIDINE HCL 0.1 MG PO TABS: 0.1000 mg | ORAL_TABLET | Freq: Every day | ORAL | 3 refills | Status: DC | PRN

## 2019-09-21 MED ORDER — PROMETHAZINE HCL 25 MG PO TABS: 25.0000 mg | ORAL_TABLET | Freq: Four times a day (QID) | ORAL | 1 refills | Status: AC | PRN

## 2019-09-21 MED ORDER — LANTUS SOLOSTAR 100 UNIT/ML ~~LOC~~ SOPN: 85.0000 [IU] | PEN_INJECTOR | Freq: Every day | SUBCUTANEOUS | 3 refills | Status: DC

## 2019-09-21 MED ORDER — OXYCODONE-ACETAMINOPHEN 5-325 MG PO TABS: 1.0000 | ORAL_TABLET | Freq: Three times a day (TID) | ORAL | 0 refills | Status: DC | PRN

## 2019-09-21 MED ORDER — ALBUTEROL SULFATE HFA 108 (90 BASE) MCG/ACT IN AERS: 1.0000 | INHALATION_SPRAY | Freq: Two times a day (BID) | RESPIRATORY_TRACT | 1 refills | Status: AC | PRN

## 2019-09-21 NOTE — Patient Instructions (Signed)
Continue to use your incentive spirometer at least once an hour  Hold your methocarbamol and use Flexeril for severe muscle spasms.  If you can tolerate using NSAIDs and Tylenol also use these medicines as directed on the box or bottle to also help with pain control.  To swollen areas like your forearm your elbow and your shoulder apply ice To areas with severely tightened and spasming muscles - you can do heat therapy  Today and tomorrow will likely be the worst days for muscle skeletal pain and then it should gradually start to get better over the next several weeks to 1 to 2 months  Please go across the street that your x-rays done so we can reevaluate for other areas that are injured and that were not imaged in the ER  Follow-up with your back specialist as soon as you can  Follow-up here in 1 week if you are still dealing with pain or having trouble taking a deep breath or feel that you may require further pain medications by [facility] log will require a follow-up office visit before we can prescribe any more pain medication  I would suggest to increase her gabapentin dose to 300 mg 3 times a day.   Watch for signs of pneumonia such as fever, sweats, cough, shortness of breath or increased pain in your chest or with deep breathing  Be sure to use stool softener while taking pain medication to avoid severe constipation which will further irritate your abdomen and your right side

## 2019-09-21 NOTE — Progress Notes (Signed)
Patient ID: Victoria Austin, female    DOB: 1950-08-29, 69 y.o.   MRN: [id number]  PCP: Hubbard Hartshorn, FNP  Chief Complaint  Patient presents with  . Hospitalization Follow-up    fell in bathroom  . Broken toe    second left toe broken    Subjective:   Victoria Austin is a 69 y.o. female, presents to clinic with CC of the following: Pt presents for follow up after fall and ER visit.  She is in severe pain, slightly distressed, shaking, diaphoretic, and having spasms of pain to her right lateral ribs, right side and abd, she has right foot and toe in splint, right arm has a skin tear that is bandaged and she is bruised with decreased ROM and pain to her right forearm, right elbow, right shoulder.  She is terrified to breath or cough. Pain has gradually worsened from the time of her fall, and acutely worsened yesterday and today.    I previously reviewed ER encounter and CT head and cervical spine, I reviewed records and other imaging today with pt and her daughter.  Reviewed today in exam room CXR to check ribs, right forearm since she complains of severe pain there, no fx.    Pt has mild intermittent HA, msk pain, joint pain, right rib and abdominal pain and have severely worsened in the past 2 days.  She has taken percocet from a visit about a month ago and it only helps a little.  She does have an incentive spirometer and is trying to use it.  She has hx of back back/surgery has a specialists she sees.  She reports Abd bloating last night Right rib pain with large area of bruising and swelling, severe pain, denies SOB, fever, chills, cough, hemoptysis Right elbow pain, forearm pain, shoulder bruised she is concerned that it was not all xrayed   Pt states she fell in the bathroom on Sunday and hit the toilet then the cabinet and the floor.  Right shoulder- pain anterior right shoulder with limited ROM  Right elbow- pain posterior right elbow  Right ribs- pain and bruising  anterior lower right ribs- area marked with BB marker.  KUB- pain anterior RUQ  History of stroke, hodgkins lymphoma, GERD, diabetes, hernia repair   history limited due to condition of the pt   Patient Active Problem List   Diagnosis Date Noted  . OSA on CPAP 09/01/2019  . Anticoagulant long-term use 09/01/2019  . Lumbar degenerative disc disease 06/08/2019  . Lumbar facet arthropathy 06/08/2019  . Chronic pain syndrome 06/08/2019  . Capsulitis 05/20/2019  . Benign essential HTN 04/15/2019  . Constipation 04/13/2019  . Vertigo 04/13/2019  . Hypertriglyceridemia 04/13/2019  . 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 Diabetic neuropathy ([place]) 02/15/2019  . Cerebrovascular accident (CVA) ([place]) 01/08/2019  . Incontinence   . RA (rheumatoid arthritis) ([place])   . Temporal arteritis ([place]) 01/01/2019  . Goals of care, counseling/discussion 12/26/2018  . Albuminuria 12/18/2018  . Dyslipidemia associated with type 2 diabetes mellitus ([place]) 12/11/2018  . Compression fracture of L1 lumbar vertebra ([place]) 12/11/2018  . 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 Anxiety   . Depression   . Diverticulitis 05/05/2018  . Hypertensive disorder 05/05/2018  . Fibromyalgia 03/21/2016  . Irritable bowel syndrome 09/19/2014  . GERD (gastroesophageal reflux disease) 04/03/2011  . Hiatal hernia 01/12/2007  . Bipolar 1 disorder ([place]) 07/29/2004      Current Outpatient Medications:  .  ADVAIR HFA [id number] MCG/ACT inhaler, Inhale 1 puff into the lungs 2 (two) times daily., Disp: 1 Inhaler, Rfl: 3 .  albuterol (PROAIR HFA) 108 (90 Base) MCG/ACT inhaler, Inhale 1 puff into the lungs 2 (two) times daily as needed., Disp: 18 g, Rfl: 1 .  alendronate (FOSAMAX) 70 MG tablet, Take 1 tablet by mouth once a week., Disp: , Rfl:  .  carbamazepine (TEGRETOL) 200 MG tablet, Take 1 tablet by mouth 3 (three) times daily., Disp: , Rfl:  .  cloNIDine (CATAPRES) 0.1 MG tablet, Take  1 tablet (0.1 mg total) by mouth daily as needed (blood pressure >180/100)., Disp: 60 tablet, Rfl: 3 .  clopidogrel (PLAVIX) 75 MG tablet, Take 1 tablet (75 mg total) by mouth daily., Disp: 90 tablet, Rfl: 1 .  Continuous Blood Gluc Receiver (FREESTYLE LIBRE 14 DAY READER) DEVI, 1 Device by Does not apply route 4 (four) times daily., Disp: 1 Device, Rfl: 0 .  Continuous Blood Gluc Sensor (FREESTYLE LIBRE 14 DAY SENSOR) MISC, 1 Device by Does not apply route every 14 (fourteen) days., Disp: 6 each, Rfl: 1 .  dapagliflozin propanediol (FARXIGA) 10 MG TABS tablet, Take 10 mg by mouth daily., Disp: , Rfl:  .  docusate sodium (COLACE) 100 MG capsule, Take 100 mg by mouth 2 (two) times daily., Disp: , Rfl:  .  DULoxetine (CYMBALTA) 60 MG capsule, Take 1 capsule by mouth daily., Disp: , Rfl:  .  EPINEPHrine (EPIPEN 2-PAK) 0.3 mg/0.3 mL IJ SOAJ injection, Inject 0.3 mLs (0.3 mg total) into the muscle as needed for anaphylaxis. Then call 911 for ER transport., Disp: 0.3 mL, Rfl: 1 .  gabapentin (NEURONTIN) 300 MG capsule, Take 2 capsules (600 mg total) by mouth at bedtime., Disp: 180 capsule, Rfl: 1 .  hydrALAZINE (APRESOLINE) 50 MG tablet, Take 1 tablet (50 mg total) by mouth 3 (three) times daily., Disp: 270 tablet, Rfl: 1 .  Icosapent Ethyl 1 g CAPS, Take 2 capsules (2 g total) by mouth 2 (two) times daily., Disp: 360 capsule, Rfl: 3 .  Insulin Glargine (LANTUS SOLOSTAR) 100 UNIT/ML Solostar Pen, Inject 85 Units into the skin daily., Disp: 10 pen, Rfl: 3 .  lisinopril (ZESTRIL) 5 MG tablet, Take 1 tablet by mouth daily., Disp: , Rfl:  .  loratadine (CLARITIN) 10 MG tablet, Take 1 tablet (10 mg total) by mouth daily., Disp: 90 tablet, Rfl: 1 .  LORazepam (ATIVAN) 1 MG tablet, Take 1 tablet by mouth daily., Disp: , Rfl:  .  MAGNESIUM-OXIDE 400 (241.3 Mg) MG tablet, Take 1 tablet (400 mg total) by mouth daily., Disp: 90 tablet, Rfl: 1 .  meclizine (ANTIVERT) 25 MG tablet, Take 1 tablet (25 mg total) by mouth  3 (three) times daily as needed for dizziness., Disp: 180 tablet, Rfl: 1 .  methocarbamol (ROBAXIN) 750 MG tablet, Take 1 tablet (750 mg total) by mouth 2 (two) times daily as needed for muscle spasms., Disp: 180 tablet, Rfl: 1 .  mirabegron ER (MYRBETRIQ) 50 MG TB24 tablet, Take 1 tablet (50 mg total) by mouth daily., Disp: 90 tablet, Rfl: 3 .  montelukast (SINGULAIR) 10 MG tablet, Take 1 tablet (10 mg total) by mouth daily., Disp: 90 tablet, Rfl: 0 .  NIFEdipine (ADALAT CC) 60 MG 24 hr tablet, Take 1 tablet (60 mg total) by mouth 2 (two) times daily., Disp: 180 tablet, Rfl: 1 .  NOVOLOG FLEXPEN 100 UNIT/ML FlexPen, Inject 14 Units into the skin 3 (three) times daily. With meals, Disp:  3 pen, Rfl: 3 .  pantoprazole (PROTONIX) 40 MG tablet, Take 1 tablet (40 mg total) by mouth daily., Disp: 90 tablet, Rfl: 1 .  primidone (MYSOLINE) 250 MG tablet, Take 1 tablet (250 mg total) by mouth at bedtime., Disp: 90 tablet, Rfl: 1 .  promethazine (PHENERGAN) 25 MG tablet, Take 1 tablet (25 mg total) by mouth every 6 (six) hours as needed., Disp: 30 tablet, Rfl: 1 .  propranolol (INDERAL) 10 MG tablet, Take 1 tablet (10 mg total) by mouth 2 (two) times daily., Disp: 180 tablet, Rfl: 1 .  rosuvastatin (CRESTOR) 20 MG tablet, Take 1 tablet (20 mg total) by mouth daily., Disp: 90 tablet, Rfl: 3 .  SURE COMFORT PEN NEEDLES 31G X 5 MM MISC, Inject 1 pen as directed 5 (five) times daily as needed., Disp: 100 each, Rfl: 3 .  traZODone (DESYREL) 50 MG tablet, Take 1 tablet by mouth at bedtime., Disp: , Rfl:  .  trimethoprim (TRIMPEX) 100 MG tablet, Take 1 tablet (100 mg total) by mouth daily., Disp: 90 tablet, Rfl: 3 .  Vitamin D, Ergocalciferol, (DRISDOL) 1.25 MG (50000 UNIT) CAPS capsule, Take 1 capsule (50,000 Units total) by mouth once a week., Disp: 12 capsule, Rfl: 1 .  buPROPion (WELLBUTRIN) 100 MG tablet, Take 100 mg by mouth daily., Disp: , Rfl:    Allergies  Allergen Reactions  . Bacitracin-Polymyxin B      Other reaction(s): Other (See Comments)  . Ciprofloxacin Nausea And Vomiting  . Cyclobenzaprine Nausea And Vomiting    Other reaction(s): Other (See Comments), Unknown Other reaction(s): Unknown  . Diazepam Nausea And Vomiting    Other reaction(s): Other (See Comments), Unknown Other reaction(s): Unknown  . Ketorolac     Other reaction(s): Unknown  . Mushroom Extract Complex   . Neomycin-Polymyxin-Gramicidin   . Ranitidine Hcl   . Ondansetron Nausea And Vomiting     Family History  Problem Relation Age of Onset  . Lung cancer Mother   . Hypertension Mother   . Hypertension Sister   . Hypertension Brother   . Bone cancer Maternal Aunt   . Colon cancer Maternal Uncle      Social History   Socioeconomic History  . Marital status: Legally Separated    Spouse name: Not on file  . Number of children: 2  . Years of education: Not on file  . Highest education level: Not on file  Occupational History  . Occupation: retired  Tobacco Use  . Smoking status: Never Smoker  . Smokeless tobacco: Never Used  Substance and Sexual Activity  . Alcohol use: Never  . Drug use: Never  . Sexual activity: Not Currently    Partners: Male  Other Topics Concern  . Not on file  Social History Narrative  . Not on file   Social Determinants of Health   Financial Resource Strain: Low Risk   . Difficulty of Paying Living Expenses: Not hard at all  Food Insecurity: No Food Insecurity  . Worried About Charity fundraiser in the Last Year: Never true  . Ran Out of Food in the Last Year: Never true  Transportation Needs: No Transportation Needs  . Lack of Transportation (Medical): No  . Lack of Transportation (Non-Medical): No  Physical Activity: Inactive  . Days of Exercise per Week: 0 days  . Minutes of Exercise per Session: 0 min  Stress: Stress Concern Present  . Feeling of Stress : Very much  Social Connections: Moderately Isolated  .  Frequency of Communication with Friends and  Family: More than three times a week  . Frequency of Social Gatherings with Friends and Family: More than three times a week  . Attends Religious Services: Never  . Active Member of Clubs or Organizations: No  . Attends Archivist Meetings: Never  . Marital Status: Separated  Intimate Partner Violence: Not At Risk  . Fear of Current or Ex-Partner: No  . Emotionally Abused: No  . Physically Abused: No  . Sexually Abused: No    Chart Review Today: I personally reviewed active problem list, medication list, allergies, family history, social history, health maintenance, notes from last encounter, lab results, imaging with the patient/caregiver today.   Review of Systems  Constitutional: Negative.  Negative for chills and fever.  HENT: Negative.   Eyes: Negative.  Negative for visual disturbance.  Respiratory: Negative for cough, choking, chest tightness, shortness of breath and wheezing.   Cardiovascular: Negative for chest pain, palpitations and leg swelling.  Gastrointestinal: Positive for abdominal distention ("bloating"), abdominal pain and constipation. Negative for blood in stool, diarrhea, nausea and vomiting.  Endocrine: Negative.   Genitourinary: Negative.  Negative for dysuria, frequency and hematuria.  Musculoskeletal: Positive for back pain, joint swelling and myalgias.  Skin: Positive for color change and wound.  Allergic/Immunologic: Negative.   Neurological: Positive for tremors and headaches (mild). Negative for dizziness, seizures, syncope, facial asymmetry, speech difficulty, weakness, light-headedness and numbness.  Psychiatric/Behavioral: Negative.        Objective:   Vitals:   09/21/19 1311  BP: 134/82  Pulse: 83  Resp: 20  Temp: (!) 97.1 F (36.2 C)  TempSrc: Temporal  SpO2: 100%  Weight: 182 lb 14.4 oz (83 kg)  Height: 5\' 4"  (1.626 m)    Body mass index is 31.39 kg/m.  Physical Exam Vitals and nursing note reviewed.  Constitutional:        Appearance: She is obese. She is diaphoretic.     Interventions: Face mask in place.     Comments: Appears severely uncomfortable, shaking, sweating, multiple bandages, seated on exam table with daughter by her side  Cardiovascular:     Pulses: Normal pulses.     Heart sounds: Normal heart sounds.  Pulmonary:     Effort: Tachypnea (mildly ) present. No accessory muscle usage or retractions.     Comments: Splinted inspirations, CTA A&P when pt took deep inspirations, no rales, rhonchi or wheeze Chest:     Chest wall: Swelling and tenderness present. No lacerations.       Comments: Right lower anterior and lateral ribs, roughly rib 7 down, severe ttp, chest wall with large area of ecchymosis, edema (hematoma) no flail chest observed or palpated, severe ttp with even light tough or auscultation  Abdominal:     General: Abdomen is protuberant. Bowel sounds are decreased. There is distension. There is no abdominal bruit.     Tenderness: There is abdominal tenderness in the right upper quadrant and epigastric area.  Musculoskeletal:     Comments: Limited ROM of back and neck with muscle spasms Right shoulder ttp and bruised to anterior shoulder, limited flexion, no deformity Right elbow ttp, mild edema, no bruising or deformity, decreased ROM unable to flex to 90 degrees due to pain Right forearm and wrist bruising, skin tear, sig edema to dorsal wrist and distal forearm, diffusely ttp, no deformity  Skin:    Findings: Bruising present.     Comments: Right distal ulnar forearm with small skin tear  and bruising, bandage with gauze, xeroform and circular gauze   Neurological:     Mental Status: She is alert.     Cranial Nerves: No dysarthria or facial asymmetry.     Sensory: Sensation is intact.     Comments: Unable to complete full neuro due to multiple MSK injuries, decreased ROM and severe pain and distress by pt  Psychiatric:        Attention and Perception: Attention normal.         Mood and Affect: Mood normal.        Behavior: Behavior is cooperative.      Results for orders placed or performed during the hospital encounter of 09/01/19  Protime-INR  Result Value Ref Range   Prothrombin Time 12.4 11.4 - 15.2 seconds   INR 0.9 0.8 - 1.2  APTT  Result Value Ref Range   aPTT 24 24 - 36 seconds  CBC  Result Value Ref Range   WBC 6.7 4.0 - 10.5 K/uL   RBC 4.07 3.87 - 5.11 MIL/uL   Hemoglobin 12.1 12.0 - 15.0 g/dL   HCT 36.5 36.0 - 46.0 %   MCV 89.7 80.0 - 100.0 fL   MCH 29.7 26.0 - 34.0 pg   MCHC 33.2 30.0 - 36.0 g/dL   RDW 12.6 11.5 - 15.5 %   Platelets 232 150 - 400 K/uL   nRBC 0.0 0.0 - 0.2 %  Differential  Result Value Ref Range   Neutrophils Relative % 55 %   Neutro Abs 3.7 1.7 - 7.7 K/uL   Lymphocytes Relative 33 %   Lymphs Abs 2.2 0.7 - 4.0 K/uL   Monocytes Relative 8 %   Monocytes Absolute 0.5 0.1 - 1.0 K/uL   Eosinophils Relative 2 %   Eosinophils Absolute 0.1 0.0 - 0.5 K/uL   Basophils Relative 1 %   Basophils Absolute 0.1 0.0 - 0.1 K/uL   Immature Granulocytes 1 %   Abs Immature Granulocytes 0.05 0.00 - 0.07 K/uL  Comprehensive metabolic panel  Result Value Ref Range   Sodium 135 135 - 145 mmol/L   Potassium 5.4 (H) 3.5 - 5.1 mmol/L   Chloride 100 98 - 111 mmol/L   CO2 24 22 - 32 mmol/L   Glucose, Bld 182 (H) 70 - 99 mg/dL   BUN 23 8 - 23 mg/dL   Creatinine, Ser 1.19 (H) 0.44 - 1.00 mg/dL   Calcium 9.1 8.9 - 10.3 mg/dL   Total Protein 7.9 6.5 - 8.1 g/dL   Albumin 4.2 3.5 - 5.0 g/dL   AST 22 15 - 41 U/L   ALT 12 0 - 44 U/L   Alkaline Phosphatase 90 38 - 126 U/L   Total Bilirubin 0.5 0.3 - 1.2 mg/dL   GFR calc non Af Amer 47 (L) >60 mL/min   GFR calc Af Amer 55 (L) >60 mL/min   Anion gap 11 5 - 15  Sedimentation rate  Result Value Ref Range   Sed Rate 27 0 - 30 mm/hr        Assessment & Plan:     1. Rib contusion, right, initial encounter Large area of bruising and edema, suspect hematoma and contusion, cannot see all  ribs on CXR done in ER, discussed with pt and daughter limited utility of Xrays for rib fx, and she is already doing tx for it, continue incentive spirometer, treat pain - sx so severe that we will recheck rib film Discussed benefit vs risk of narcotic pain  medicines, treat pain so that she had take deep breaths to avoid development of pneumonia.   - DG Ribs Unilateral Right; Future  2. Acute pain of right shoulder Bruised, limited ROM, likely shoulder contusion and possible ligament injury, unlikely to be humerus fx, but will get plain films - DG Shoulder Right; Future  3. Right elbow pain Decreased ROM, pt screamed out loud when trying to flex - DG Elbow Complete Right; Future  4. Abdominal bloating Bloated tender abd, hx of constipation, normal BM in the last 2 days, no N/V, abd firm to palpation and very tender to right lower ribs/RUQ area, pt states its always that firm, just feels more bloated than normal. - DG Abd 1 View; Future  5. Hematoma of right chest wall, initial encounter See above  6. Abdominal pain, unspecified abdominal location Check KUB, do stool softners with pain meds, go to hospital with any N/V or worsening of pain, or with lack of BM or flatus  7. Back pain, unspecified back location, unspecified back pain laterality, unspecified chronicity Hx of back pain/surgery s/p fall, expected back strain and pain after injury, conservative tx, f/up spine specialist  8. Pleuritic chest pain Severe difficulty sometimes during visit with muscle spasms where pt had very difficulty time with pain inspirations were very very shallow and splinted, improved when gently repositioning pt and encouraging her to breath in through her nose slowly and out through her mouth.   9. Fall, subsequent encounter - DG Abd 1 View; Future - DG Ribs Unilateral Right; Future - DG Elbow Complete Right; Future - DG Shoulder Right; Future  10. Type 2 diabetes mellitus with hyperglycemia, with  long-term current use of insulin (HCC) Med refill - will need normal f/up - Insulin Glargine (LANTUS SOLOSTAR) 100 UNIT/ML Solostar Pen; Inject 85 Units into the skin daily.  Dispense: 10 pen; Refill: 3  11. B12 deficiency Med refills  - cyanocobalamin ((VITAMIN B-12)) injection 1,000 mcg  12. Encounter for examination following treatment at hospital ER records and imaging reviewed extensively     Multiple med refills requested Tx severe pain, muscle spasms, back and neck strain, rib contusion and multiple areas of joint pain, swelling and bruising with muscle relaxers, tylenol, NSAIDs, percocet - reviewed controlled substance database, pt has some allergies and has been recently taking percocets - warned of sedation and respiratory depression, pt needs to take with someone home with her, she should not take with her other sedating medications and there are a lot on her chart concerning for polypharmacy in elderly pt  - we reviewed all the meds, ativan, phenergan, muscle relaxers and percocet and they verbalize understanding of risks and concerns of polypharmacy - pt will be monitored at home by family members.  Encouraged to use heat therapy for spasming muscles, ice to bruised and swollen areas, OTC meds first line for pain, and f/up closely as needed for any worsening sx.  Likely most areas of pain/injury will gradually improve int he next 1-2 weeks or 1-2 months.  F/up right away with any worsening abd sx, SOB, CP, fever, chills sweats.   Meds ordered this encounter  Medications  . cloNIDine (CATAPRES) 0.1 MG tablet    Sig: Take 1 tablet (0.1 mg total) by mouth daily as needed (blood pressure >180/100).    Dispense:  60 tablet    Refill:  3  . Insulin Glargine (LANTUS SOLOSTAR) 100 UNIT/ML Solostar Pen    Sig: Inject 85 Units into the skin daily.  Dispense:  10 pen    Refill:  3  . EPINEPHrine (EPIPEN 2-PAK) 0.3 mg/0.3 mL IJ SOAJ injection    Sig: Inject 0.3 mLs (0.3 mg total)  into the muscle as needed for anaphylaxis. Then call 911 for ER transport.    Dispense:  0.3 mL    Refill:  1  . albuterol (PROAIR HFA) 108 (90 Base) MCG/ACT inhaler    Sig: Inhale 1 puff into the lungs 2 (two) times daily as needed.    Dispense:  18 g    Refill:  1  . ADVAIR HFA [id number] MCG/ACT inhaler    Sig: Inhale 1 puff into the lungs 2 (two) times daily.    Dispense:  1 Inhaler    Refill:  3  . promethazine (PHENERGAN) 25 MG tablet    Sig: Take 1 tablet (25 mg total) by mouth every 6 (six) hours as needed.  Marland Kitchen tiZANidine (ZANAFLEX) 4 MG tablet    Sig: Take 0.5-1.5 tablets (2-6 mg total) by mouth every 8 (eight) hours as needed for muscle spasms ((hold robaxin for the next week)).  Marland Kitchen oxyCODONE-acetaminophen (PERCOCET) 5-325 MG tablet    Sig: Take 1-2 tablets by mouth every 8 (eight) hours as needed for severe pain (do not take with other sedating medicine).  . cyanocobalamin ((VITAMIN B-12)) injection 1,000 mcg       Delsa Grana, PA-C 09/21/19 1:59 PM

## 2019-09-22 ENCOUNTER — Encounter: Payer: Self-pay | Admitting: Family Medicine

## 2019-09-23 ENCOUNTER — Ambulatory Visit: Payer: Medicare Other | Admitting: Podiatry

## 2019-09-29 ENCOUNTER — Other Ambulatory Visit: Payer: Self-pay | Admitting: Neurosurgery

## 2019-09-30 DIAGNOSIS — Z794 Long term (current) use of insulin: Secondary | ICD-10-CM | POA: Diagnosis not present

## 2019-09-30 DIAGNOSIS — E1121 Type 2 diabetes mellitus with diabetic nephropathy: Secondary | ICD-10-CM | POA: Diagnosis not present

## 2019-09-30 DIAGNOSIS — N1831 Chronic kidney disease, stage 3a: Secondary | ICD-10-CM | POA: Diagnosis not present

## 2019-10-01 ENCOUNTER — Ambulatory Visit: Payer: Medicare Other | Admitting: Family Medicine

## 2019-10-04 ENCOUNTER — Other Ambulatory Visit: Payer: Self-pay

## 2019-10-04 ENCOUNTER — Ambulatory Visit
Admission: RE | Admit: 2019-10-04 | Discharge: 2019-10-04 | Disposition: A | Discharge status: Home / Self Care | Payer: Medicare Other | Source: Ambulatory Visit | Attending: Family Medicine | Admitting: Family Medicine

## 2019-10-04 DIAGNOSIS — Z1231 Encounter for screening mammogram for malignant neoplasm of breast: Secondary | ICD-10-CM | POA: Diagnosis not present

## 2019-10-04 MED ORDER — TIZANIDINE HCL 4 MG PO TABS: 2.0000 mg | ORAL_TABLET | Freq: Three times a day (TID) | ORAL | 0 refills | Status: DC | PRN

## 2019-10-05 DIAGNOSIS — F411 Generalized anxiety disorder: Secondary | ICD-10-CM | POA: Diagnosis not present

## 2019-10-05 DIAGNOSIS — F3132 Bipolar disorder, current episode depressed, moderate: Secondary | ICD-10-CM | POA: Diagnosis not present

## 2019-10-06 ENCOUNTER — Encounter: Payer: Self-pay | Admitting: Family Medicine

## 2019-10-07 ENCOUNTER — Telehealth: Payer: Self-pay

## 2019-10-07 ENCOUNTER — Encounter: Payer: Self-pay | Admitting: Family Medicine

## 2019-10-07 NOTE — Telephone Encounter (Signed)
Patient psychiatrist switched Trazodone to Ambien to help her sleep. Patient stated the more she has taken the medication the more side effects she seems to have. She felt like she was " drunk" all the time. Psychiatrist told her to stop taking ambien and start back trazodone but she did not. Patient stopped both medications but was still having "drunk" mornings where she felt horrible.    Patient then stopped taking her gabapentin and methocarbamol for 4-5 days.   Last night she started back taking all of her medication, gabapentin, methocarbamol and primidone.   She woke up this morning and could hardly move. She felt exhausted and her grandson brought her coffee and she could not lift her arm to get the coffee from him.   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 She is scheduled for a follow up on March 16.

## 2019-10-08 ENCOUNTER — Other Ambulatory Visit: Payer: Self-pay | Admitting: Family Medicine

## 2019-10-08 ENCOUNTER — Encounter: Payer: Self-pay | Admitting: Family Medicine

## 2019-10-08 ENCOUNTER — Telehealth (INDEPENDENT_AMBULATORY_CARE_PROVIDER_SITE_OTHER): Payer: Medicare Other | Admitting: Family Medicine

## 2019-10-08 DIAGNOSIS — S32010D Wedge compression fracture of first lumbar vertebra, subsequent encounter for fracture with routine healing: Secondary | ICD-10-CM

## 2019-10-08 DIAGNOSIS — E1142 Type 2 diabetes mellitus with diabetic polyneuropathy: Secondary | ICD-10-CM

## 2019-10-08 DIAGNOSIS — F19939 Other psychoactive substance use, unspecified with withdrawal, unspecified: Secondary | ICD-10-CM

## 2019-10-08 DIAGNOSIS — E1165 Type 2 diabetes mellitus with hyperglycemia: Secondary | ICD-10-CM

## 2019-10-08 DIAGNOSIS — R4189 Other symptoms and signs involving cognitive functions and awareness: Secondary | ICD-10-CM

## 2019-10-08 DIAGNOSIS — Z794 Long term (current) use of insulin: Secondary | ICD-10-CM

## 2019-10-08 DIAGNOSIS — R404 Transient alteration of awareness: Secondary | ICD-10-CM

## 2019-10-08 DIAGNOSIS — I10 Essential (primary) hypertension: Secondary | ICD-10-CM

## 2019-10-08 DIAGNOSIS — G250 Essential tremor: Secondary | ICD-10-CM

## 2019-10-08 DIAGNOSIS — Z79899 Other long term (current) drug therapy: Secondary | ICD-10-CM | POA: Diagnosis not present

## 2019-10-08 MED ORDER — HYDRALAZINE HCL 50 MG PO TABS: 50.0000 mg | ORAL_TABLET | Freq: Three times a day (TID) | ORAL | 3 refills | Status: DC

## 2019-10-08 MED ORDER — GABAPENTIN 100 MG PO CAPS: ORAL_CAPSULE | ORAL | 1 refills | Status: DC

## 2019-10-08 MED ORDER — NOVOLOG FLEXPEN 100 UNIT/ML ~~LOC~~ SOPN: 22.0000 [IU] | PEN_INJECTOR | Freq: Three times a day (TID) | SUBCUTANEOUS | 3 refills | Status: AC

## 2019-10-08 MED ORDER — PRIMIDONE 50 MG PO TABS: 200.0000 mg | ORAL_TABLET | Freq: Every day | ORAL | Status: DC

## 2019-10-08 NOTE — Telephone Encounter (Signed)
Name: Victoria Austin   MRN: [id number]    DOB: 28-May-1951   Date:10/08/2019       Progress Note  Subjective:    Chief Complaint  Patient presents with  . Allergic Reaction    see pts last phone with SE and concerns of ambien started by psychiatrist  . Paralysis    severe weakness, paralysis, inability to lift arm after holding multiple meds     I connected with  [name] on 10/08/19 at 8:55 pm by telephone and verified that I am speaking with the correct person using two identifiers.   I discussed the limitations, risks, security and privacy concerns of performing an evaluation and management service by telephone and the availability of in person appointments. Staff also discussed with the patient that there may be a patient responsible charge related to this service. Patient Location: home Provider Location: [facility] Additional Individuals present: none  HPI 9:10 PM Pt presents via telephone visit, following up on concerning pt messages with inability to move, several med changes, several sedating meds.  I called the pt, she states that she stopped the [place] and she restarted some of her other meds.  She feels better today and she is not as shakey and profoundly fatigued.  She did try and get appts with her psychiatrist, endocrinologist, and she has also talked to call center staff and [facility] staff over the past 2 days.  She expressed frustration about seeing me a provider who is not her PCP  - though I did see her about 2-3 weeks ago for an acute issue and I explained that while PCP is out on maternity leave she will see the other providers in clinic.  She also has specialists changing meds that are not readily visible and reconsiled to our chart - we reviewed several med discrepancies coming from multiple specialist.  Patient has picked up some prescriptions that were continue to be refilled from PCP even though endocrinology or neurology had taken over the medication and refilled  at different doses.  Patient had been taking 600 mg of gabapentin in the evening for fibromyalgia but this made her too groggy in the morning so she had been taking only 300 mg of gabapentin.  We did briefly discuss this at her last visit and I did explain to her at that time that stepwise dose changes with gabapentin is okay as long as she feels okay with it.  Abrupt discontinuation of gabapentin can be very problematic, have multiple withdrawal symptoms and in some cases can cause seizures.  I believe I discussed with her previously I encouraged her to contact me and I would send in a smaller dose 100 mg capsule that she could further wean off of medications or adjust to a dose that would be effective and hopefully have less side effects.  She does want to continue to do that -she did stop gabapentin to try and reset everything because she felt extremely sedated when she took multiple medications with a new Ambien from her psychiatrist, over the last day or 2 she has resumed gabapentin at 300 mg dose in the evening however she states that this was originally given to her because there is a family history of restless leg symptoms she states she does not have restless leg symptoms but instead has fibromyalgia.  I updated the med list tonight canceling 300 mg capsule and instead sending 100 mg capsule with a gradual taper encourage the patient to do this over  3 weeks and to reach out to me if she has any withdrawal symptoms as she gets to the lower doses or discontinue completely.  Her insulin dosing is all different per endocrinology I did update it according to what she says she is currently on.  I also encouraged her to clarify with her pharmacy her medications.  Encouraged her that when PCP has been giving her 1 dose and she goes to a specialist for management that when they changed medications that take precedence over PCP, wound when she goes next to the pharmacy she needs to make sure that they cancel  the prescription that the PCP did and then only get intake and refill the new prescription from the specialist so that there are not conflicting med refills coming through for the same medications with very different dosing.  Patient states that she is continue to take Cymbalta 60 mg daily from her psychiatrist  She did request a refill on hydralazine, I had previously refilled her other blood pressure medications a few weeks ago.  Lantus and NovoLog dosing was updated to reflect most current recommendations from patient's endocrinologist but they were not sent to the pharmacy.  Patient also states that another med discrepancy which may have caused some of her symptoms was that her primidone she had been taking 200 mg dose at bedtime for inherited essential tremor.  A old prescription from her PCP had gone through and was refilled that was 250 mg and she was inadvertently taking that.  She has done better with the lower dose prescribed by her neurologist Dr. Tasia Catchings -updated the primidone prescription and dosing on the chart  Patient was also previously taking pain medication very sparingly after a very traumatic fall with multiple rib fractures that I saw her for on February 23, I had reviewed her meds thoroughly with her and her daughter at that time is very concerned about potential for polypharmacy and sedation, she states that she did take a little bit of the pain medication but it caused severe constipation she is no longer taking Percocet I have removed this also from her med list     Patient Active Problem List   Diagnosis Date Noted  . Hereditary essential tremor 10/08/2019  . Polypharmacy 10/08/2019  . OSA on CPAP 09/01/2019  . Anticoagulant long-term use 09/01/2019  . Lumbar degenerative disc disease 06/08/2019  . Lumbar facet arthropathy 06/08/2019  . Chronic pain syndrome 06/08/2019  . Capsulitis 05/20/2019  . Benign essential HTN 04/15/2019  . Constipation 04/13/2019  . Vertigo  04/13/2019  . Hypertriglyceridemia 04/13/2019  . 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 Diabetic neuropathy ([place]) 02/15/2019  . Cerebrovascular accident (CVA) ([place]) 01/08/2019  . Incontinence   . RA (rheumatoid arthritis) ([place])   . Temporal arteritis ([place]) 01/01/2019  . Goals of care, counseling/discussion 12/26/2018  . Albuminuria 12/18/2018  . Dyslipidemia associated with type 2 diabetes mellitus ([place]) 12/11/2018  . Compression fracture of L1 lumbar vertebra ([place]) 12/11/2018  . 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 Anxiety   . Depression   . Diverticulitis 05/05/2018  . Hypertensive disorder 05/05/2018  . Fibromyalgia 03/21/2016  . Irritable bowel syndrome 09/19/2014  . GERD (gastroesophageal reflux disease) 04/03/2011  . Hiatal hernia 01/12/2007  . Bipolar 1 disorder ([place]) 07/29/2004    Social History   Tobacco Use  . Smoking status: Never Smoker  . Smokeless tobacco: Never Used  Substance Use Topics  . Alcohol use: Never  Current Outpatient Medications:  .  ADVAIR HFA [id number] MCG/ACT inhaler, Inhale 1 puff into the lungs 2 (two) times daily., Disp: 1 Inhaler, Rfl: 3 .  albuterol (PROAIR HFA) 108 (90 Base) MCG/ACT inhaler, Inhale 1 puff into the lungs 2 (two) times daily as needed., Disp: 18 g, Rfl: 1 .  alendronate (FOSAMAX) 70 MG tablet, Take 70 mg by mouth once a week. , Disp: , Rfl:  .  carbamazepine (TEGRETOL) 200 MG tablet, Take 200 mg by mouth 3 (three) times daily. , Disp: , Rfl:  .  cloNIDine (CATAPRES) 0.1 MG tablet, Take 1 tablet (0.1 mg total) by mouth daily as needed (blood pressure >180/100)., Disp: 60 tablet, Rfl: 3 .  clopidogrel (PLAVIX) 75 MG tablet, Take 1 tablet (75 mg total) by mouth daily., Disp: 90 tablet, Rfl: 1 .  Continuous Blood Gluc Receiver (FREESTYLE LIBRE 14 DAY READER) DEVI, 1 Device by Does not apply route 4 (four) times daily., Disp: 1 Device, Rfl: 0 .  Continuous Blood Gluc Sensor (FREESTYLE LIBRE  14 DAY SENSOR) MISC, 1 Device by Does not apply route every 14 (fourteen) days., Disp: 6 each, Rfl: 1 .  cyanocobalamin (,VITAMIN B-12,) 1000 MCG/ML injection, Inject 1,000 mcg into the muscle once., Disp: , Rfl:  .  docusate sodium (COLACE) 250 MG capsule, Take 250 mg by mouth daily. , Disp: , Rfl:  .  DULoxetine (CYMBALTA) 60 MG capsule, Take 60 mg by mouth daily. , Disp: , Rfl:  .  empagliflozin (JARDIANCE) 10 MG TABS tablet, Take 10 mg by mouth daily., Disp: , Rfl:  .  EPINEPHrine (EPIPEN 2-PAK) 0.3 mg/0.3 mL IJ SOAJ injection, Inject 0.3 mLs (0.3 mg total) into the muscle as needed for anaphylaxis. Then call 911 for ER transport., Disp: 0.3 mL, Rfl: 1 .  gabapentin (NEURONTIN) 100 MG capsule, Take 300 mg PO at bedtime x 7d, then 200 mg PO at bedtime x 7 d, then 100 mg po at bedtime x 7 d, Disp: 60 capsule, Rfl: 1 .  hydrALAZINE (APRESOLINE) 50 MG tablet, Take 1 tablet (50 mg total) by mouth 3 (three) times daily., Disp: 270 tablet, Rfl: 3 .  Icosapent Ethyl 1 g CAPS, Take 2 capsules (2 g total) by mouth 2 (two) times daily., Disp: 360 capsule, Rfl: 3 .  Insulin Glargine (LANTUS SOLOSTAR) 100 UNIT/ML Solostar Pen, Inject 85 Units into the skin daily. (Patient taking differently: Inject 85 Units into the skin at bedtime. ), Disp: 10 pen, Rfl: 3 .  lisinopril (ZESTRIL) 5 MG tablet, Take 5 mg by mouth daily. , Disp: , Rfl:  .  loratadine (CLARITIN) 10 MG tablet, Take 1 tablet (10 mg total) by mouth daily. (Patient not taking: Reported on 10/08/2019), Disp: 90 tablet, Rfl: 1 .  LORazepam (ATIVAN) 1 MG tablet, Take 1 mg by mouth daily as needed for anxiety or sleep. , Disp: , Rfl:  .  MAGNESIUM-OXIDE 400 (241.3 Mg) MG tablet, Take 1 tablet (400 mg total) by mouth daily. (Patient taking differently: Take 400 mg by mouth at bedtime. ), Disp: 90 tablet, Rfl: 1 .  meclizine (ANTIVERT) 25 MG tablet, Take 1 tablet (25 mg total) by mouth 3 (three) times daily as needed for dizziness. (Patient taking  differently: Take 25-50 mg by mouth See admin instructions. Take 50 mg by mouth in the morning and 25 mg in the afternoon), Disp: 180 tablet, Rfl: 1 .  methocarbamol (ROBAXIN) 750 MG tablet, Take 1 tablet (750 mg total) by mouth 2 (two)  times daily as needed for muscle spasms., Disp: 180 tablet, Rfl: 1 .  mirabegron ER (MYRBETRIQ) 50 MG TB24 tablet, Take 1 tablet (50 mg total) by mouth daily. (Patient taking differently: Take 100 mg by mouth daily. ), Disp: 90 tablet, Rfl: 3 .  montelukast (SINGULAIR) 10 MG tablet, Take 1 tablet (10 mg total) by mouth daily. (Patient not taking: Reported on 10/08/2019), Disp: 90 tablet, Rfl: 0 .  NIFEdipine (ADALAT CC) 60 MG 24 hr tablet, Take 1 tablet (60 mg total) by mouth 2 (two) times daily., Disp: 180 tablet, Rfl: 1 .  NOVOLOG FLEXPEN 100 UNIT/ML FlexPen, Inject 22-34 Units into the skin 3 (three) times daily. With meals, Disp: 3 pen, Rfl: 3 .  pantoprazole (PROTONIX) 40 MG tablet, Take 1 tablet (40 mg total) by mouth daily., Disp: 90 tablet, Rfl: 1 .  primidone (MYSOLINE) 50 MG tablet, Take 4 tablets (200 mg total) by mouth at bedtime., Disp: , Rfl:  .  promethazine (PHENERGAN) 25 MG tablet, Take 1 tablet (25 mg total) by mouth every 6 (six) hours as needed., Disp: 30 tablet, Rfl: 1 .  propranolol (INDERAL) 10 MG tablet, Take 1 tablet (10 mg total) by mouth 2 (two) times daily. (Patient taking differently: Take 10 mg by mouth daily. ), Disp: 180 tablet, Rfl: 1 .  rosuvastatin (CRESTOR) 20 MG tablet, Take 1 tablet (20 mg total) by mouth daily., Disp: 90 tablet, Rfl: 3 .  SURE COMFORT PEN NEEDLES 31G X 5 MM MISC, Inject 1 pen as directed 5 (five) times daily as needed., Disp: 100 each, Rfl: 3 .  trimethoprim (TRIMPEX) 100 MG tablet, Take 1 tablet (100 mg total) by mouth daily., Disp: 90 tablet, Rfl: 3 .  Vitamin D, Ergocalciferol, (DRISDOL) 1.25 MG (50000 UNIT) CAPS capsule, Take 1 capsule (50,000 Units total) by mouth once a week., Disp: 12 capsule, Rfl:  1  Allergies  Allergen Reactions  . Ambien [Zolpidem] Other (See Comments)    Severely sedating  . Bacitracin-Polymyxin B   . Ciprofloxacin Nausea And Vomiting  . Cyclobenzaprine Nausea And Vomiting  . Diazepam Nausea And Vomiting  . Ketorolac   . Mushroom Extract Complex   . Neomycin-Polymyxin-Gramicidin   . Ranitidine Hcl   . Ondansetron Nausea And Vomiting    Chart Review: I personally reviewed active problem list, medication list, allergies, family history, social history, health maintenance, notes from last encounter, lab results, imaging with the patient/caregiver today.   Review of Systems 10 Systems reviewed and are negative for acute change except as noted in the HPI.   Objective:    Virtual encounter, vitals limited, only able to obtain the following Today's Vitals   10/08/19 2146  Weight: 182 lb (82.6 kg)   Body mass index is 31.24 kg/m. Nursing Note and Vital Signs reviewed.  Physical Exam Phonation clear, talkative, good mood. PE limited by telephone encounter  No results found for this or any previous visit (from the past 72 hour(s)).  Assessment and Plan:     ICD-10-CM   1. 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 Polypharmacy  Z79.899    very concerned with pts mulitple sedating meds, benzo, ambien, pain, multiple muscle relaxer and other neuro/pain meds  3. Sedated due to multiple medications  R40.4    see above - pt likely  had sx from [place] potentiating other sedating medications  4. Type 2 diabetes mellitus with hyperglycemia, with long-term current use of insulin (HCC)  E11.65 NOVOLOG FLEXPEN 100 UNIT/ML FlexPen   Z79.4   5. Compression fracture of L1 vertebra with routine healing, subsequent encounter  S32.010D gabapentin (NEURONTIN) 100 MG capsule  6. Benign  essential HTN  I10 hydrALAZINE (APRESOLINE) 50 MG tablet  7. Diabetic polyneuropathy associated with type 2 diabetes mellitus (HCC)  E11.42 gabapentin (NEURONTIN) 100 MG capsule   per endo - sugars spiked recently, endo increased doses of basal and postprandial to higher sliding scale  8. Hereditary essential tremor  G25.0 primidone (MYSOLINE) 50 MG tablet   meds adjusted to reflect what she is taking - per Neuro/Dr. Tasia Catchings    Reviewed all her medications as thoroughly as I could this evening updated her chart.  Patient states she is feeling better and next week early in the week she does have 4 different follow-up appointments.  I discussed canceling the appointment that she has with me on Tuesday since her concerns have improved and we had ample time to speak this evening.  She also has another appointment scheduled the following week with me.  I encouraged her to send me a MyChart messages if there is anything she needs me to follow-up on or know from her outside doctors that are not in our same system.  She agreed that she would like to cancel the appointment and she will keep the following.   - I discussed the assessment and treatment plan with the patient. The patient was provided an opportunity to ask questions and all were answered. The patient agreed with the plan and demonstrated an understanding of the instructions.  - The patient was advised to call back or seek an in-person evaluation if the symptoms worsen or if the condition fails to improve as anticipated.  I provided 40 minutes of non-face-to-face time during this encounter.  On phone with pt from 8:55 pm - 9:20 pm  Delsa Grana, PA-C 10/08/19 9:55 PM

## 2019-10-08 NOTE — Progress Notes (Signed)
Name: Victoria Austin   MRN: [id number]    DOB: 04-18-1951   Date:10/08/2019       Progress Note  Subjective:    Chief Complaint Med SE- sedation, fatigue - Mychart message   I connected with  Victoria Austin on 10/08/19 at 8:55 pm by telephone and verified that I am speaking with the correct person using two identifiers.   I discussed the limitations, risks, security and privacy concerns of performing an evaluation and management service by telephone and the availability of in person appointments. Staff also discussed with the patient that there may be a patient responsible charge related to this service. Patient Location: home Provider Location: [facility] clinic Additional Individuals present: none  HPI 9:10 PM Pt presents via telephone visit, following up on concerning pt messages with inability to move, several med changes, several sedating meds.  I called the pt, she states that she stopped the [place] and she restarted some of her other meds.  She feels better today and she is not as shakey and profoundly fatigued.  She did try and get appts with her psychiatrist, endocrinologist, and she has also talked to call center staff and [facility] staff over the past 2 days.  She expressed frustration about seeing me a provider who is not her PCP  - though I did see her about 2-3 weeks ago for an acute issue and I explained that while PCP is out on maternity leave she will see the other providers in clinic.  She also has specialists changing meds that are not readily visible and reconsiled to our chart - we reviewed several med discrepancies coming from multiple specialist.  Patient has picked up some prescriptions that were continue to be refilled from PCP even though endocrinology or neurology had taken over the medication and refilled at different doses.  Patient had been taking 600 mg of gabapentin in the evening for fibromyalgia but this made her too groggy in the morning so she had been taking only 300  mg of gabapentin.  We did briefly discuss this at her last visit and I did explain to her at that time that stepwise dose changes with gabapentin is okay as long as she feels okay with it.  Abrupt discontinuation of gabapentin can be very problematic, have multiple withdrawal symptoms and in some cases can cause seizures.  I believe I discussed with her previously I encouraged her to contact me and I would send in a smaller dose 100 mg capsule that she could further wean off of medications or adjust to a dose that would be effective and hopefully have less side effects.  She does want to continue to do that -she did stop gabapentin to try and reset everything because she felt extremely sedated when she took multiple medications with a new Ambien from her psychiatrist, over the last day or 2 she has resumed gabapentin at 300 mg dose in the evening however she states that this was originally given to her because there is a family history of restless leg symptoms she states she does not have restless leg symptoms but instead has fibromyalgia.  I updated the med list tonight canceling 300 mg capsule and instead sending 100 mg capsule with a gradual taper encourage the patient to do this over 3 weeks and to reach out to me if she has any withdrawal symptoms as she gets to the lower doses or discontinue completely.  Her insulin dosing is all different per endocrinology I did update  it according to what she says she is currently on.  I also encouraged her to clarify with her pharmacy her medications.  Encouraged her that when PCP has been giving her 1 dose and she goes to a specialist for management that when they changed medications that take precedence over PCP, wound when she goes next to the pharmacy she needs to make sure that they cancel the prescription that the PCP did and then only get intake and refill the new prescription from the specialist so that there are not conflicting med refills coming through for  the same medications with very different dosing.  Patient states that she is continue to take Cymbalta 60 mg daily from her psychiatrist  She did request a refill on hydralazine, I had previously refilled her other blood pressure medications a few weeks ago.  Lantus and NovoLog dosing was updated to reflect most current recommendations from patient's endocrinologist but they were not sent to the pharmacy.  Patient also states that another med discrepancy which may have caused some of her symptoms was that her primidone she had been taking 200 mg dose at bedtime for inherited essential tremor.  A old prescription from her PCP had gone through and was refilled that was 250 mg and she was inadvertently taking that.  She has done better with the lower dose prescribed by her neurologist Dr. Tasia Catchings -updated the primidone prescription and dosing on the chart  Patient was also previously taking pain medication very sparingly after a very traumatic fall with multiple rib fractures that I saw her for on February 23, I had reviewed her meds thoroughly with her and her daughter at that time is very concerned about potential for polypharmacy and sedation, she states that she did take a little bit of the pain medication but it caused severe constipation she is no longer taking Percocet I have removed this also from her med list     Patient Active Problem List   Diagnosis Date Noted  . Hereditary essential tremor 10/08/2019  . Polypharmacy 10/08/2019  . OSA on CPAP 09/01/2019  . Anticoagulant long-term use 09/01/2019  . Lumbar degenerative disc disease 06/08/2019  . Lumbar facet arthropathy 06/08/2019  . Chronic pain syndrome 06/08/2019  . Capsulitis 05/20/2019  . Benign essential HTN 04/15/2019  . Constipation 04/13/2019  . Vertigo 04/13/2019  . Hypertriglyceridemia 04/13/2019  . 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 Diabetic neuropathy ([place]) 02/15/2019  . Cerebrovascular accident (CVA) ([place]) 01/08/2019  .  Incontinence   . RA (rheumatoid arthritis) ([place])   . Temporal arteritis ([place]) 01/01/2019  . Goals of care, counseling/discussion 12/26/2018  . Albuminuria 12/18/2018  . Dyslipidemia associated with type 2 diabetes mellitus ([place]) 12/11/2018  . Compression fracture of L1 lumbar vertebra ([place]) 12/11/2018  . 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 Anxiety   . Depression   . Diverticulitis 05/05/2018  . Hypertensive disorder 05/05/2018  . Fibromyalgia 03/21/2016  . Irritable bowel syndrome 09/19/2014  . GERD (gastroesophageal reflux disease) 04/03/2011  . Hiatal hernia 01/12/2007  . Bipolar 1 disorder ([place]) 07/29/2004    Social History   Tobacco Use  . Smoking status: Never Smoker  . Smokeless tobacco: Never Used  Substance Use Topics  . Alcohol use: Never     Current Outpatient Medications:  .  ADVAIR HFA [id number] MCG/ACT inhaler, Inhale 1 puff into the lungs 2 (two) times daily., Disp: 1 Inhaler, Rfl: 3 .  albuterol (PROAIR HFA) 108 (90 Base)  MCG/ACT inhaler, Inhale 1 puff into the lungs 2 (two) times daily as needed., Disp: 18 g, Rfl: 1 .  alendronate (FOSAMAX) 70 MG tablet, Take 70 mg by mouth once a week. , Disp: , Rfl:  .  carbamazepine (TEGRETOL) 200 MG tablet, Take 200 mg by mouth 3 (three) times daily. , Disp: , Rfl:  .  cloNIDine (CATAPRES) 0.1 MG tablet, Take 1 tablet (0.1 mg total) by mouth daily as needed (blood pressure >180/100)., Disp: 60 tablet, Rfl: 3 .  clopidogrel (PLAVIX) 75 MG tablet, Take 1 tablet (75 mg total) by mouth daily., Disp: 90 tablet, Rfl: 1 .  Continuous Blood Gluc Receiver (FREESTYLE LIBRE 14 DAY READER) DEVI, 1 Device by Does not apply route 4 (four) times daily., Disp: 1 Device, Rfl: 0 .  Continuous Blood Gluc Sensor (FREESTYLE LIBRE 14 DAY SENSOR) MISC, 1 Device by Does not apply route every 14 (fourteen) days., Disp: 6 each, Rfl: 1 .  cyanocobalamin (,VITAMIN B-12,) 1000 MCG/ML injection, Inject 1,000  mcg into the muscle once., Disp: , Rfl:  .  docusate sodium (COLACE) 250 MG capsule, Take 250 mg by mouth daily. , Disp: , Rfl:  .  DULoxetine (CYMBALTA) 60 MG capsule, Take 60 mg by mouth daily. , Disp: , Rfl:  .  empagliflozin (JARDIANCE) 10 MG TABS tablet, Take 10 mg by mouth daily., Disp: , Rfl:  .  EPINEPHrine (EPIPEN 2-PAK) 0.3 mg/0.3 mL IJ SOAJ injection, Inject 0.3 mLs (0.3 mg total) into the muscle as needed for anaphylaxis. Then call 911 for ER transport., Disp: 0.3 mL, Rfl: 1 .  gabapentin (NEURONTIN) 100 MG capsule, Take 300 mg PO at bedtime x 7d, then 200 mg PO at bedtime x 7 d, then 100 mg po at bedtime x 7 d, Disp: 60 capsule, Rfl: 1 .  hydrALAZINE (APRESOLINE) 50 MG tablet, Take 1 tablet (50 mg total) by mouth 3 (three) times daily., Disp: 270 tablet, Rfl: 3 .  Icosapent Ethyl 1 g CAPS, Take 2 capsules (2 g total) by mouth 2 (two) times daily., Disp: 360 capsule, Rfl: 3 .  Insulin Glargine (LANTUS SOLOSTAR) 100 UNIT/ML Solostar Pen, Inject 85 Units into the skin daily. (Patient taking differently: Inject 85 Units into the skin at bedtime. ), Disp: 10 pen, Rfl: 3 .  lisinopril (ZESTRIL) 5 MG tablet, Take 5 mg by mouth daily. , Disp: , Rfl:  .  loratadine (CLARITIN) 10 MG tablet, Take 1 tablet (10 mg total) by mouth daily. (Patient not taking: Reported on 10/08/2019), Disp: 90 tablet, Rfl: 1 .  LORazepam (ATIVAN) 1 MG tablet, Take 1 mg by mouth daily as needed for anxiety or sleep. , Disp: , Rfl:  .  MAGNESIUM-OXIDE 400 (241.3 Mg) MG tablet, Take 1 tablet (400 mg total) by mouth daily. (Patient taking differently: Take 400 mg by mouth at bedtime. ), Disp: 90 tablet, Rfl: 1 .  meclizine (ANTIVERT) 25 MG tablet, Take 1 tablet (25 mg total) by mouth 3 (three) times daily as needed for dizziness. (Patient taking differently: Take 25-50 mg by mouth See admin instructions. Take 50 mg by mouth in the morning and 25 mg in the afternoon), Disp: 180 tablet, Rfl: 1 .  methocarbamol (ROBAXIN) 750 MG  tablet, Take 1 tablet (750 mg total) by mouth 2 (two) times daily as needed for muscle spasms., Disp: 180 tablet, Rfl: 1 .  mirabegron ER (MYRBETRIQ) 50 MG TB24 tablet, Take 1 tablet (50 mg total) by mouth daily. (Patient taking differently: Take  100 mg by mouth daily. ), Disp: 90 tablet, Rfl: 3 .  montelukast (SINGULAIR) 10 MG tablet, Take 1 tablet (10 mg total) by mouth daily. (Patient not taking: Reported on 10/08/2019), Disp: 90 tablet, Rfl: 0 .  NIFEdipine (ADALAT CC) 60 MG 24 hr tablet, Take 1 tablet (60 mg total) by mouth 2 (two) times daily., Disp: 180 tablet, Rfl: 1 .  NOVOLOG FLEXPEN 100 UNIT/ML FlexPen, Inject 22-34 Units into the skin 3 (three) times daily. With meals, Disp: 3 pen, Rfl: 3 .  pantoprazole (PROTONIX) 40 MG tablet, Take 1 tablet (40 mg total) by mouth daily., Disp: 90 tablet, Rfl: 1 .  primidone (MYSOLINE) 50 MG tablet, Take 4 tablets (200 mg total) by mouth at bedtime., Disp: , Rfl:  .  promethazine (PHENERGAN) 25 MG tablet, Take 1 tablet (25 mg total) by mouth every 6 (six) hours as needed., Disp: 30 tablet, Rfl: 1 .  propranolol (INDERAL) 10 MG tablet, Take 1 tablet (10 mg total) by mouth 2 (two) times daily. (Patient taking differently: Take 10 mg by mouth daily. ), Disp: 180 tablet, Rfl: 1 .  rosuvastatin (CRESTOR) 20 MG tablet, Take 1 tablet (20 mg total) by mouth daily., Disp: 90 tablet, Rfl: 3 .  SURE COMFORT PEN NEEDLES 31G X 5 MM MISC, Inject 1 pen as directed 5 (five) times daily as needed., Disp: 100 each, Rfl: 3 .  trimethoprim (TRIMPEX) 100 MG tablet, Take 1 tablet (100 mg total) by mouth daily., Disp: 90 tablet, Rfl: 3 .  Vitamin D, Ergocalciferol, (DRISDOL) 1.25 MG (50000 UNIT) CAPS capsule, Take 1 capsule (50,000 Units total) by mouth once a week., Disp: 12 capsule, Rfl: 1  Allergies  Allergen Reactions  . Ambien [Zolpidem] Other (See Comments)    Severely sedating  . Bacitracin-Polymyxin B   . Ciprofloxacin Nausea And Vomiting  . Cyclobenzaprine Nausea  And Vomiting  . Diazepam Nausea And Vomiting  . Ketorolac   . Mushroom Extract Complex   . Neomycin-Polymyxin-Gramicidin   . Ranitidine Hcl   . Ondansetron Nausea And Vomiting    Chart Review: I personally reviewed active problem list, medication list, allergies, family history, social history, health maintenance, notes from last encounter, lab results, imaging with the patient/caregiver today.   Review of Systems 10 Systems reviewed and are negative for acute change except as noted in the HPI.   Objective:    Virtual encounter, vitals limited, only able to obtain the following Today's Vitals   10/08/19 2146  Weight: 182 lb (82.6 kg)   Body mass index is 31.24 kg/m. Nursing Note and Vital Signs reviewed.  Physical Exam Phonation clear, talkative, good mood. PE limited by telephone encounter  No results found for this or any previous visit (from the past 72 hour(s)).  Assessment and Plan:     ICD-10-CM   1. 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 Polypharmacy  Z79.899    very concerned with pts mulitple sedating meds, benzo, ambien, pain, multiple muscle relaxer and other neuro/pain meds  3. Sedated due to multiple medications  R40.4    see above - pt likely had sx from [place] potentiating other sedating medications  4. Type 2 diabetes mellitus with hyperglycemia, with long-term current use of insulin (HCC)  E11.65 NOVOLOG FLEXPEN 100 UNIT/ML FlexPen   Z79.4  5. Compression fracture of L1 vertebra with routine healing, subsequent encounter  S32.010D gabapentin (NEURONTIN) 100 MG capsule  6. Benign essential HTN  I10 hydrALAZINE (APRESOLINE) 50 MG tablet  7. Diabetic polyneuropathy associated with type 2 diabetes mellitus (HCC)  E11.42 gabapentin (NEURONTIN) 100 MG capsule   per endo -  sugars spiked recently, endo increased doses of basal and postprandial to higher sliding scale  8. Hereditary essential tremor  G25.0 primidone (MYSOLINE) 50 MG tablet   meds adjusted to reflect what she is taking - per Neuro/Dr. Tasia Catchings    Reviewed all her medications as thoroughly as I could this evening updated her chart.  Patient states she is feeling better and next week early in the week she does have 4 different follow-up appointments.  I discussed canceling the appointment that she has with me on Tuesday since her concerns have improved and we had ample time to speak this evening.  She also has another appointment scheduled the following week with me.  I encouraged her to send me a MyChart messages if there is anything she needs me to follow-up on or know from her outside doctors that are not in our same system.  She agreed that she would like to cancel the appointment and she will keep the following.   - I discussed the assessment and treatment plan with the patient. The patient was provided an opportunity to ask questions and all were answered. The patient agreed with the plan and demonstrated an understanding of the instructions.  - The patient was advised to call back or seek an in-person evaluation if the symptoms worsen or if the condition fails to improve as anticipated.  I provided 40 minutes of non-face-to-face time during this encounter.  On phone with pt from 8:55 pm - 9:20 pm  Delsa Grana, PA-C 10/08/19 9:55 PM

## 2019-10-11 DIAGNOSIS — R809 Proteinuria, unspecified: Secondary | ICD-10-CM | POA: Diagnosis not present

## 2019-10-11 DIAGNOSIS — F3132 Bipolar disorder, current episode depressed, moderate: Secondary | ICD-10-CM | POA: Diagnosis not present

## 2019-10-11 DIAGNOSIS — N189 Chronic kidney disease, unspecified: Secondary | ICD-10-CM | POA: Diagnosis not present

## 2019-10-11 DIAGNOSIS — N1831 Chronic kidney disease, stage 3a: Secondary | ICD-10-CM | POA: Diagnosis not present

## 2019-10-11 DIAGNOSIS — F411 Generalized anxiety disorder: Secondary | ICD-10-CM | POA: Diagnosis not present

## 2019-10-11 DIAGNOSIS — E1122 Type 2 diabetes mellitus with diabetic chronic kidney disease: Secondary | ICD-10-CM | POA: Diagnosis not present

## 2019-10-11 DIAGNOSIS — E871 Hypo-osmolality and hyponatremia: Secondary | ICD-10-CM | POA: Diagnosis not present

## 2019-10-12 ENCOUNTER — Ambulatory Visit: Payer: Medicare Other | Admitting: Family Medicine

## 2019-10-15 ENCOUNTER — Other Ambulatory Visit: Payer: Self-pay

## 2019-10-15 ENCOUNTER — Encounter
Admission: RE | Admit: 2019-10-15 | Discharge: 2019-10-15 | Disposition: A | Discharge status: Home / Self Care | Payer: Medicare Other | Source: Ambulatory Visit | Attending: Neurosurgery | Admitting: Neurosurgery

## 2019-10-15 DIAGNOSIS — Z01818 Encounter for other preprocedural examination: Secondary | ICD-10-CM | POA: Insufficient documentation

## 2019-10-15 HISTORY — DX: Anemia, unspecified: D64.9

## 2019-10-15 HISTORY — DX: Essential tremor: G25.0

## 2019-10-15 HISTORY — DX: Other giant cell arteritis: M31.6

## 2019-10-15 HISTORY — DX: Essential (primary) hypertension: I10

## 2019-10-15 HISTORY — DX: Other specified disorders of bone density and structure, unspecified site: M85.80

## 2019-10-15 HISTORY — DX: Polyneuropathy, unspecified: G62.9

## 2019-10-15 HISTORY — DX: Personal history of other diseases of the digestive system: Z87.19

## 2019-10-15 HISTORY — DX: Sleep apnea, unspecified: G47.30

## 2019-10-15 HISTORY — DX: Chronic kidney disease, unspecified: N18.9

## 2019-10-15 HISTORY — DX: Scoliosis, unspecified: M41.9

## 2019-10-15 NOTE — Pre-Procedure Instructions (Signed)
Left message on voice mail for patient to call back for preadmit interview. I will be sending a clearance note to Dr.Yarbroughs office for a cardiac clearance. No cardiologist on file for patient.

## 2019-10-15 NOTE — Pre-Procedure Instructions (Signed)
Also sent a medical clearance to Delsa Grana, PA-C.

## 2019-10-15 NOTE — Patient Instructions (Addendum)
INSTRUCTIONS FOR SURGERY     Your surgery is scheduled for:   Wednesday, April 7TH     To find out your arrival time for the day of surgery,          please call [phone] between 1 pm and 3 pm on :  Tuesday, April 6TH     When you arrive for surgery, report to the [facility].       Do NOT stop on the first floor to register.    REMEMBER: Instructions that are not followed completely may result in serious medical risk,  up to and including death, or upon the discretion of your surgeon and anesthesiologist,            your surgery may need to be rescheduled.  __X__ 1. Do not eat food after midnight the night before your procedure.                    No gum, candy, lozenger, tic tacs, tums or hard candies.                  ABSOLUTELY NOTHING SOLID IN YOUR MOUTH AFTER MIDNIGHT                    You may drink unlimited clear liquids up to 2 hours before you are scheduled to arrive for surgery.                   Do not drink anything within those 2 hours unless you need to take medicine, then take the                   smallest amount you need.  Clear liquids include:  water, apple juice without pulp,                   any flavor Gatorade, Black coffee, black tea.  Sugar may be added but no dairy/ honey /lemon.                        Broth and jello is not considered a clear liquid.  __x__  2. On the morning of surgery, please brush your teeth with toothpaste and water. You may rinse with                  mouthwash if you wish but DO NOT SWALLOW TOOTHPASTE OR MOUTHWASH  __X___3. NO alcohol for 24 hours before or after surgery.  __x___ 4.  Do NOT smoke or use e-cigarettes for 24 HOURS PRIOR TO SURGERY.                      DO NOT Use any chewable tobacco products for at least 6 hours prior to surgery.  __x___ 5. If you start any new medication after this appointment and prior to surgery, please     Bring it with you on the day of surgery.  ___x__ 6. Notify your doctor if there is any change in your medical condition, such as fever,  infection, vomitting, diarrhea or any open sores.  __x___ 7.  USE the CHG SOAP as instructed, the night before surgery and the day of surgery.                   Once you have washed with this soap, do NOT use any of the following: Powders, perfumes                    or lotions. Please do not wear make up, hairpins, clips or nail polish. You may wear deodorant.                   Men may shave their face and neck.  Women need to shave 48 hours prior to surgery.                   DO NOT wear ANY jewelry on the day of surgery. If there are rings that are too tight to                    remove easily, please address this prior to the surgery day. Piercings need to be removed.                                                                     NO METAL ON YOUR BODY.                    Do NOT bring any valuables.  If you came to Pre-Admit testing then you will not need license,                     insurance card or credit card.  If you will be staying overnight, please either leave your things in                     the car or have your family be responsible for these items.                     [facility] IS NOT RESPONSIBLE FOR BELONGINGS OR VALUABLES.  ___X__ 8. DO NOT wear contact lenses on surgery day.  You may not have dentures,                     Hearing aides, contacts or glasses in the operating room. These items can be                    Placed in the Recovery Room to receive immediately after surgery.  ___x__ 10. Take the following medications on the morning of surgery with a sip of water:                              1. PROTONIX                     2. TEGRETOL                     3. CYMBALTA  4. HYDRALAZINE                     5. ATIVAN                     6. ANTIVERT                     7. NIFEDIPINE                      8. CRESTOR                     9. MYRBETRIQ                    10. ICOSAPENT ETHYL  _____ 11.  Follow any instructions provided to you by your surgeon.                        Such as enema, clear liquid bowel prep  __X__  12. STOP PLAVIX ACCORDING TO MEDICAL DOCTOR/CARDIOLOGIST                       THIS INCLUDES BC POWDERS / GOODIES POWDER                    YOU MAY TAKE TYLENOL ANY TIME PRIOR TO SURGERY.  __x____16.  DO NOT TAKE JARDIANCE ON THE DAY OF SURGERY. prior to surgery.  Stop on:                     TAKE 1/2 OF USUAL INSULIN DOSE ON THE EVENING PRIOR TO SURGERY.                        This is half of Lantus and half of Novalog.                     Do NOT take any diabetes medications on surgery day.  __X____17.  Continue to take the following medications but do not take on the morning of surgery:                        LISINOPRIL / TRIMPEX / COLACE /   ___X___18. If staying overnight, please have appropriate shoes to wear to be able to walk around the unit.                   Wear clean and comfortable clothing to the hospital.  REMEMBER TO BRING A PHONE CHARGER IF USING A CELL PHONE. BRING PHONE NUMBERS OF ALL YOUR CONTACT PEOPLE.

## 2019-10-15 NOTE — Pre-Procedure Instructions (Signed)
Patient returned call today as she was being seen in the ER for a panic attack, difficulty breathing and shakes. She was alert and oriented and able to answer all questions. She was also aware that she will need clearances prior to surgery. Patient will be seeing PCP later this month.

## 2019-10-20 ENCOUNTER — Telehealth: Payer: Self-pay | Admitting: Cardiology

## 2019-10-20 DIAGNOSIS — F411 Generalized anxiety disorder: Secondary | ICD-10-CM | POA: Diagnosis not present

## 2019-10-20 DIAGNOSIS — F3132 Bipolar disorder, current episode depressed, moderate: Secondary | ICD-10-CM | POA: Diagnosis not present

## 2019-10-20 NOTE — Telephone Encounter (Signed)
Pt was seen as a new pt on 04/30/19 by Dr. Garen Lah. She was recommended to have a stress myoview which was done on 10/27 and was normal and an echo which was never done.  I called her to advise that she should have the echo to complete her clearance assessment.  She then told me that she actually has an appt with Dr. Mylo Red tomorrow for clearance.  I will route this to Dr. Mylo Red to readdress clearance.   I also advised her that we will not address holding plavix as this is prescribed by her PCP for stroke and not for cardiac reason. She has an appt with her PCP tomorrow for clearance. I will also route this to PCP.

## 2019-10-20 NOTE — Telephone Encounter (Signed)
Thanks for the heads up.

## 2019-10-20 NOTE — Telephone Encounter (Signed)
   [facility] Pre-operative Risk Assessment    Request for surgical clearance:  1. What type of surgery is being performed? T11-L3 POSTERIOR FUSION; T12-L2 POSTERIOR COLUMN OSTEEOTOMIES  2. When is this surgery scheduled? 11/03/19  3. What type of clearance is required (medical clearance vs. Pharmacy clearance to hold med vs. Both)? BOTH  Are there any medications that need to be held prior to surgery and how long? PLAVIX  4. Practice name and name of physician performing surgery? [facility], DR Elaina Pattee   5. What is your office phone [phone]   7.   What is your office fax number [phone]  8.   Anesthesia type (None, local, MAC, general) ? NOT LISTED   Lucienne Minks 10/20/2019, 11:14 AM  _________________________________________________________________   (provider comments below)

## 2019-10-21 ENCOUNTER — Ambulatory Visit (INDEPENDENT_AMBULATORY_CARE_PROVIDER_SITE_OTHER): Payer: Medicare Other | Admitting: Cardiology

## 2019-10-21 ENCOUNTER — Other Ambulatory Visit: Payer: Self-pay

## 2019-10-21 ENCOUNTER — Encounter: Payer: Self-pay | Admitting: Cardiology

## 2019-10-21 ENCOUNTER — Ambulatory Visit
Admission: RE | Admit: 2019-10-21 | Discharge: 2019-10-21 | Disposition: A | Discharge status: Home / Self Care | Payer: Medicare Other | Attending: Family Medicine | Admitting: Family Medicine

## 2019-10-21 ENCOUNTER — Ambulatory Visit (INDEPENDENT_AMBULATORY_CARE_PROVIDER_SITE_OTHER): Payer: Medicare Other | Admitting: Family Medicine

## 2019-10-21 ENCOUNTER — Inpatient Hospital Stay: Payer: Medicare Other | Attending: Oncology

## 2019-10-21 ENCOUNTER — Other Ambulatory Visit: Payer: Self-pay | Admitting: Oncology

## 2019-10-21 ENCOUNTER — Encounter: Payer: Self-pay | Admitting: Family Medicine

## 2019-10-21 ENCOUNTER — Ambulatory Visit
Admission: RE | Admit: 2019-10-21 | Discharge: 2019-10-21 | Disposition: A | Discharge status: Home / Self Care | Payer: Medicare Other | Source: Ambulatory Visit | Attending: Family Medicine | Admitting: Family Medicine

## 2019-10-21 VITALS — BP 124/62 | HR 81 | Temp 98.1°F | Resp 14 | Ht 64.0 in | Wt 187.6 lb

## 2019-10-21 VITALS — BP 140/74 | HR 79 | Ht 65.0 in | Wt 187.0 lb

## 2019-10-21 DIAGNOSIS — Z794 Long term (current) use of insulin: Secondary | ICD-10-CM | POA: Insufficient documentation

## 2019-10-21 DIAGNOSIS — Z85828 Personal history of other malignant neoplasm of skin: Secondary | ICD-10-CM | POA: Diagnosis not present

## 2019-10-21 DIAGNOSIS — E1122 Type 2 diabetes mellitus with diabetic chronic kidney disease: Secondary | ICD-10-CM | POA: Diagnosis not present

## 2019-10-21 DIAGNOSIS — M797 Fibromyalgia: Secondary | ICD-10-CM | POA: Insufficient documentation

## 2019-10-21 DIAGNOSIS — M7989 Other specified soft tissue disorders: Secondary | ICD-10-CM | POA: Diagnosis not present

## 2019-10-21 DIAGNOSIS — M25521 Pain in right elbow: Secondary | ICD-10-CM

## 2019-10-21 DIAGNOSIS — Z79899 Other long term (current) drug therapy: Secondary | ICD-10-CM | POA: Insufficient documentation

## 2019-10-21 DIAGNOSIS — M8580 Other specified disorders of bone density and structure, unspecified site: Secondary | ICD-10-CM | POA: Insufficient documentation

## 2019-10-21 DIAGNOSIS — N1831 Chronic kidney disease, stage 3a: Secondary | ICD-10-CM | POA: Insufficient documentation

## 2019-10-21 DIAGNOSIS — I10 Essential (primary) hypertension: Secondary | ICD-10-CM

## 2019-10-21 DIAGNOSIS — G8929 Other chronic pain: Secondary | ICD-10-CM | POA: Diagnosis not present

## 2019-10-21 DIAGNOSIS — Z888 Allergy status to other drugs, medicaments and biological substances status: Secondary | ICD-10-CM | POA: Diagnosis not present

## 2019-10-21 DIAGNOSIS — Z8249 Family history of ischemic heart disease and other diseases of the circulatory system: Secondary | ICD-10-CM | POA: Diagnosis not present

## 2019-10-21 DIAGNOSIS — M069 Rheumatoid arthritis, unspecified: Secondary | ICD-10-CM | POA: Diagnosis not present

## 2019-10-21 DIAGNOSIS — F319 Bipolar disorder, unspecified: Secondary | ICD-10-CM | POA: Insufficient documentation

## 2019-10-21 DIAGNOSIS — E538 Deficiency of other specified B group vitamins: Secondary | ICD-10-CM

## 2019-10-21 DIAGNOSIS — Z01818 Encounter for other preprocedural examination: Secondary | ICD-10-CM | POA: Diagnosis not present

## 2019-10-21 DIAGNOSIS — Z881 Allergy status to other antibiotic agents status: Secondary | ICD-10-CM | POA: Diagnosis not present

## 2019-10-21 DIAGNOSIS — G250 Essential tremor: Secondary | ICD-10-CM | POA: Insufficient documentation

## 2019-10-21 DIAGNOSIS — Z800 Family history of malignant neoplasm of digestive organs: Secondary | ICD-10-CM | POA: Insufficient documentation

## 2019-10-21 DIAGNOSIS — Z8571 Personal history of Hodgkin lymphoma: Secondary | ICD-10-CM | POA: Diagnosis not present

## 2019-10-21 DIAGNOSIS — G4730 Sleep apnea, unspecified: Secondary | ICD-10-CM | POA: Insufficient documentation

## 2019-10-21 DIAGNOSIS — Z8673 Personal history of transient ischemic attack (TIA), and cerebral infarction without residual deficits: Secondary | ICD-10-CM | POA: Diagnosis not present

## 2019-10-21 DIAGNOSIS — M549 Dorsalgia, unspecified: Secondary | ICD-10-CM | POA: Insufficient documentation

## 2019-10-21 DIAGNOSIS — E7800 Pure hypercholesterolemia, unspecified: Secondary | ICD-10-CM | POA: Diagnosis not present

## 2019-10-21 DIAGNOSIS — D631 Anemia in chronic kidney disease: Secondary | ICD-10-CM | POA: Insufficient documentation

## 2019-10-21 DIAGNOSIS — F419 Anxiety disorder, unspecified: Secondary | ICD-10-CM | POA: Diagnosis not present

## 2019-10-21 DIAGNOSIS — E785 Hyperlipidemia, unspecified: Secondary | ICD-10-CM | POA: Diagnosis not present

## 2019-10-21 DIAGNOSIS — Z7902 Long term (current) use of antithrombotics/antiplatelets: Secondary | ICD-10-CM | POA: Diagnosis not present

## 2019-10-21 DIAGNOSIS — I129 Hypertensive chronic kidney disease with stage 1 through stage 4 chronic kidney disease, or unspecified chronic kidney disease: Secondary | ICD-10-CM | POA: Diagnosis not present

## 2019-10-21 DIAGNOSIS — Z808 Family history of malignant neoplasm of other organs or systems: Secondary | ICD-10-CM | POA: Insufficient documentation

## 2019-10-21 DIAGNOSIS — Z8579 Personal history of other malignant neoplasms of lymphoid, hematopoietic and related tissues: Secondary | ICD-10-CM

## 2019-10-21 DIAGNOSIS — Z801 Family history of malignant neoplasm of trachea, bronchus and lung: Secondary | ICD-10-CM | POA: Insufficient documentation

## 2019-10-21 LAB — CBC WITH DIFFERENTIAL/PLATELET
Abs Immature Granulocytes: 0.1 10*3/uL — ABNORMAL HIGH (ref 0.00–0.07)
Basophils Absolute: 0.1 10*3/uL (ref 0.0–0.1)
Basophils Relative: 1 %
Eosinophils Absolute: 0.2 10*3/uL (ref 0.0–0.5)
Eosinophils Relative: 2 %
HCT: 33.2 % — ABNORMAL LOW (ref 36.0–46.0)
Hemoglobin: 10.8 g/dL — ABNORMAL LOW (ref 12.0–15.0)
Immature Granulocytes: 2 %
Lymphocytes Relative: 32 %
Lymphs Abs: 2.1 10*3/uL (ref 0.7–4.0)
MCH: 29.7 pg (ref 26.0–34.0)
MCHC: 32.5 g/dL (ref 30.0–36.0)
MCV: 91.2 fL (ref 80.0–100.0)
Monocytes Absolute: 0.6 10*3/uL (ref 0.1–1.0)
Monocytes Relative: 8 %
Neutro Abs: 3.6 10*3/uL (ref 1.7–7.7)
Neutrophils Relative %: 55 %
Platelets: 259 10*3/uL (ref 150–400)
RBC: 3.64 MIL/uL — ABNORMAL LOW (ref 3.87–5.11)
RDW: 12.7 % (ref 11.5–15.5)
WBC: 6.6 10*3/uL (ref 4.0–10.5)
nRBC: 0 % (ref 0.0–0.2)

## 2019-10-21 LAB — COMPREHENSIVE METABOLIC PANEL
ALT: 12 U/L (ref 0–44)
AST: 20 U/L (ref 15–41)
Albumin: 4 g/dL (ref 3.5–5.0)
Alkaline Phosphatase: 110 U/L (ref 38–126)
Anion gap: 8 (ref 5–15)
BUN: 22 mg/dL (ref 8–23)
CO2: 23 mmol/L (ref 22–32)
Calcium: 8.5 mg/dL — ABNORMAL LOW (ref 8.9–10.3)
Chloride: 106 mmol/L (ref 98–111)
Creatinine, Ser: 1.03 mg/dL — ABNORMAL HIGH (ref 0.44–1.00)
GFR calc Af Amer: >60 mL/min (ref >60)
GFR calc non Af Amer: 56 mL/min — ABNORMAL LOW (ref >60)
Glucose, Bld: 197 mg/dL — ABNORMAL HIGH (ref 70–99)
Potassium: 4.9 mmol/L (ref 3.5–5.1)
Sodium: 137 mmol/L (ref 135–145)
Total Bilirubin: 0.3 mg/dL (ref 0.3–1.2)
Total Protein: 7.2 g/dL (ref 6.5–8.1)

## 2019-10-21 LAB — LACTATE DEHYDROGENASE: LDH: 163 U/L (ref 98–192)

## 2019-10-21 LAB — SEDIMENTATION RATE: Sed Rate: 37 mm/hr — ABNORMAL HIGH (ref 0–30)

## 2019-10-21 MED ORDER — CYANOCOBALAMIN 1000 MCG/ML IJ SOLN
1000.0000 ug | Freq: Once | INTRAMUSCULAR | Status: AC
  Administered 2019-10-21: 1000 ug via INTRAMUSCULAR

## 2019-10-21 NOTE — Patient Instructions (Addendum)
No labs today, please follow up if you have concerns about magnesium or if you want to have that checked later.  If you have any worsening bowels or other symptoms, then you may want to continue the magnesium supplement and we can monitor your labs.  Good luck, with your surgery  Get your elbow X-rayed again and I'll call you with the results and we'll get you to a specialist to follow up on it.

## 2019-10-21 NOTE — Progress Notes (Signed)
Patient ID: Victoria Austin, female    DOB: [birth year], 69 y.o.   MRN: [id number]  PCP: Hubbard Hartshorn, FNP  Chief Complaint  Patient presents with  . surgical clearance    back surgery  . Elbow Pain    right wants anoth xray    Subjective:   Lanijah Venditti is a 69 y.o. female, presents to clinic with CC of the following:  Pt here for med clearance for back surgery.  She has been to cardiology and she is pending an ECHO for clearance and they have sent me a note to state that plavix will need to be cleared by PCP.  PCP is out of office, I have discussed the patient's case with supervising physician and the patient's PCPs supervising physician, patient stroke was over a year ago.  He is on Plavix 75 mg tablet daily.  Discussed risk of holding Plavix medication including but not limited to stroke.  Pt reports hx of TIA 2019 sept   Patient is also very concerned about her right elbow.  After she fell on third she went to the ER and then had a follow-up visit with me 2 days later.  There were multiple areas which were bruised and swollen which we x-rayed in addition to the ER's x-ray.  We did x-ray the right elbow on 09/21/2019.  It was unremarkable, she states that it is gotten worse with more swollen with more pain and decreased range of motion.    Patient is also here for her B12 injection to be done by CMA   Patient Active Problem List   Diagnosis Date Noted  . Hereditary essential tremor 10/08/2019  . Polypharmacy 10/08/2019  . OSA on CPAP 09/01/2019  . Anticoagulant long-term use 09/01/2019  . Lumbar degenerative disc disease 06/08/2019  . Lumbar facet arthropathy 06/08/2019  . Chronic pain syndrome 06/08/2019  . Capsulitis 05/20/2019  . Benign essential HTN 04/15/2019  . Constipation 04/13/2019  . Vertigo 04/13/2019  . Hypertriglyceridemia 04/13/2019  . 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 Diabetic neuropathy ([place]) 02/15/2019  . Cerebrovascular accident (CVA) ([place]) 01/08/2019  .  Incontinence   . RA (rheumatoid arthritis) ([place])   . Temporal arteritis ([place]) 01/01/2019  . Goals of care, counseling/discussion 12/26/2018  . Albuminuria 12/18/2018  . Dyslipidemia associated with type 2 diabetes mellitus ([place]) 12/11/2018  . Compression fracture of L1 lumbar vertebra ([place]) 12/11/2018  . 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 Anxiety   . Depression   . Diverticulitis 05/05/2018  . Hypertensive disorder 05/05/2018  . Fibromyalgia 03/21/2016  . Irritable bowel syndrome 09/19/2014  . GERD (gastroesophageal reflux disease) 04/03/2011  . Hiatal hernia 01/12/2007  . Bipolar 1 disorder ([place]) 07/29/2004      Current Outpatient Medications:  .  ADVAIR HFA [id number] MCG/ACT inhaler, Inhale 1 puff into the lungs 2 (two) times daily. (Patient taking differently: Inhale 1 puff into the lungs 2 (two) times daily as needed. ), Disp: 1 Inhaler, Rfl: 3 .  albuterol (PROAIR HFA) 108 (90 Base) MCG/ACT inhaler, Inhale 1 puff into the lungs 2 (two) times daily as needed., Disp: 18 g, Rfl: 1 .  alendronate (FOSAMAX) 70 MG tablet, Take 70 mg by mouth once a week. , Disp: , Rfl:  .  carbamazepine (TEGRETOL) 200 MG tablet, Take 200 mg by mouth 3 (three) times daily. , Disp: , Rfl:  .  cloNIDine (CATAPRES) 0.1 MG tablet, Take 1 tablet (0.1 mg total) by  mouth daily as needed (blood pressure >180/100)., Disp: 60 tablet, Rfl: 3 .  clopidogrel (PLAVIX) 75 MG tablet, Take 1 tablet (75 mg total) by mouth daily., Disp: 90 tablet, Rfl: 1 .  cyanocobalamin (,VITAMIN B-12,) 1000 MCG/ML injection, Inject 1,000 mcg into the muscle every 30 (thirty) days. , Disp: , Rfl:  .  docusate sodium (COLACE) 250 MG capsule, Take 250 mg by mouth daily. , Disp: , Rfl:  .  DULoxetine (CYMBALTA) 60 MG capsule, Take 60 mg by mouth daily. , Disp: , Rfl:  .  empagliflozin (JARDIANCE) 10 MG TABS tablet, Take 10 mg by mouth daily., Disp: , Rfl:  .  EPINEPHrine (EPIPEN 2-PAK) 0.3  mg/0.3 mL IJ SOAJ injection, Inject 0.3 mLs (0.3 mg total) into the muscle as needed for anaphylaxis. Then call 911 for ER transport., Disp: 0.3 mL, Rfl: 1 .  gabapentin (NEURONTIN) 100 MG capsule, Take 300 mg PO at bedtime x 7d, then 200 mg PO at bedtime x 7 d, then 100 mg po at bedtime x 7 d (Patient taking differently: Take 300 mg by mouth at bedtime. ), Disp: 60 capsule, Rfl: 1 .  hydrALAZINE (APRESOLINE) 50 MG tablet, Take 1 tablet (50 mg total) by mouth 3 (three) times daily., Disp: 270 tablet, Rfl: 3 .  Icosapent Ethyl 1 g CAPS, Take 2 capsules (2 g total) by mouth 2 (two) times daily., Disp: 360 capsule, Rfl: 3 .  Insulin Glargine (LANTUS SOLOSTAR) 100 UNIT/ML Solostar Pen, Inject 85 Units into the skin daily. (Patient taking differently: Inject 85 Units into the skin at bedtime. ), Disp: 10 pen, Rfl: 3 .  lisinopril (ZESTRIL) 5 MG tablet, Take 5 mg by mouth daily. , Disp: , Rfl:  .  loratadine (CLARITIN) 10 MG tablet, Take 1 tablet (10 mg total) by mouth daily., Disp: 90 tablet, Rfl: 1 .  LORazepam (ATIVAN) 1 MG tablet, Take 1 mg by mouth daily as needed for anxiety or sleep. , Disp: , Rfl:  .  MAGNESIUM-OXIDE 400 (241.3 Mg) MG tablet, Take 1 tablet (400 mg total) by mouth daily. (Patient taking differently: Take 400 mg by mouth at bedtime. ), Disp: 90 tablet, Rfl: 1 .  meclizine (ANTIVERT) 25 MG tablet, Take 1 tablet (25 mg total) by mouth 3 (three) times daily as needed for dizziness. (Patient taking differently: Take 25-50 mg by mouth See admin instructions. Take 50 mg by mouth in the morning and 25 mg in the afternoon), Disp: 180 tablet, Rfl: 1 .  mirabegron ER (MYRBETRIQ) 50 MG TB24 tablet, Take 1 tablet (50 mg total) by mouth daily. (Patient taking differently: Take 100 mg by mouth daily. ), Disp: 90 tablet, Rfl: 3 .  montelukast (SINGULAIR) 10 MG tablet, Take 1 tablet (10 mg total) by mouth daily., Disp: 90 tablet, Rfl: 0 .  NIFEdipine (ADALAT CC) 60 MG 24 hr tablet, Take 1 tablet (60 mg  total) by mouth 2 (two) times daily., Disp: 180 tablet, Rfl: 1 .  NOVOLOG FLEXPEN 100 UNIT/ML FlexPen, Inject 22-34 Units into the skin 3 (three) times daily. With meals, Disp: 3 pen, Rfl: 3 .  pantoprazole (PROTONIX) 40 MG tablet, Take 1 tablet (40 mg total) by mouth daily., Disp: 90 tablet, Rfl: 1 .  primidone (MYSOLINE) 50 MG tablet, Take 4 tablets (200 mg total) by mouth at bedtime., Disp: , Rfl:  .  promethazine (PHENERGAN) 25 MG tablet, Take 1 tablet (25 mg total) by mouth every 6 (six) hours as needed., Disp: 30 tablet, Rfl:  1 .  propranolol (INDERAL) 10 MG tablet, Take 1 tablet (10 mg total) by mouth 2 (two) times daily. (Patient taking differently: Take 10 mg by mouth daily. ), Disp: 180 tablet, Rfl: 1 .  rosuvastatin (CRESTOR) 20 MG tablet, Take 1 tablet (20 mg total) by mouth daily., Disp: 90 tablet, Rfl: 3 .  trimethoprim (TRIMPEX) 100 MG tablet, Take 1 tablet (100 mg total) by mouth daily., Disp: 90 tablet, Rfl: 3 .  Vitamin D, Ergocalciferol, (DRISDOL) 1.25 MG (50000 UNIT) CAPS capsule, Take 1 capsule (50,000 Units total) by mouth once a week., Disp: 12 capsule, Rfl: 1 .  Continuous Blood Gluc Receiver (FREESTYLE LIBRE 14 DAY READER) DEVI, 1 Device by Does not apply route 4 (four) times daily., Disp: 1 Device, Rfl: 0 .  Continuous Blood Gluc Sensor (FREESTYLE LIBRE 14 DAY SENSOR) MISC, 1 Device by Does not apply route every 14 (fourteen) days. (Patient not taking: Reported on 10/21/2019), Disp: 6 each, Rfl: 1 .  methocarbamol (ROBAXIN) 750 MG tablet, Take 1 tablet (750 mg total) by mouth 2 (two) times daily as needed for muscle spasms. (Patient not taking: Reported on 10/15/2019), Disp: 180 tablet, Rfl: 1 .  SURE COMFORT PEN NEEDLES 31G X 5 MM MISC, Inject 1 pen as directed 5 (five) times daily as needed., Disp: 100 each, Rfl: 3   Allergies  Allergen Reactions  . [employer] Anaphylaxis    BEE STINGS ALSO  . Ciprofloxacin Nausea And Vomiting  . Cyclobenzaprine Nausea And Vomiting  .  Diazepam Nausea And Vomiting  . Ketorolac     HALLUCINATIONS  . Ondansetron Nausea And Vomiting    ABRUPT VOMITTING  . Ranitidine Hcl     FEELS LIKE BUGS CRAWLING ALL OVER HER  . Ambien [Zolpidem] Other (See Comments)    Severely sedating  . Bacitracin-Polymyxin B     BURNS SKIN  . Mushroom Extract Complex     BELL PEPPERS TOO CAUSING ITCHING  . Neomycin-Polymyxin-Gramicidin Rash    BURNS SKIN     Family History  Problem Relation Age of Onset  . Lung cancer Mother   . Hypertension Mother   . Hypertension Sister   . Hypertension Brother   . Bone cancer Maternal Aunt   . Colon cancer Maternal Uncle      Social History   Socioeconomic History  . Marital status: Legally Separated    Spouse name: Not on file  . Number of children: 2  . Years of education: Not on file  . Highest education level: Not on file  Occupational History  . Occupation: retired  Tobacco Use  . Smoking status: Never Smoker  . Smokeless tobacco: Never Used  Substance and Sexual Activity  . Alcohol use: Never  . Drug use: Never  . Sexual activity: Not Currently    Partners: Male  Other Topics Concern  . Not on file  Social History Narrative   Patient lives with daughter and 98 year old grandson.   Would like to have some home assistance after back surgery as her daughter works 12 hour days       And no one will be there to help her out.   Social Determinants of Health   Financial Resource Strain: Low Risk   . Difficulty of Paying Living Expenses: Not hard at all  Food Insecurity: No Food Insecurity  . Worried About Charity fundraiser in the Last Year: Never true  . Ran Out of Food in the Last Year: Never  true  Transportation Needs: No Transportation Needs  . Lack of Transportation (Medical): No  . Lack of Transportation (Non-Medical): No  Physical Activity: Inactive  . Days of Exercise per Week: 0 days  . Minutes of Exercise per Session: 0 min  Stress: Stress Concern Present  .  Feeling of Stress : Very much  Social Connections: Moderately Isolated  . Frequency of Communication with Friends and Family: More than three times a week  . Frequency of Social Gatherings with Friends and Family: More than three times a week  . Attends Religious Services: Never  . Active Member of Clubs or Organizations: No  . Attends Archivist Meetings: Never  . Marital Status: Separated  Intimate Partner Violence: Not At Risk  . Fear of Current or Ex-Partner: No  . Emotionally Abused: No  . Physically Abused: No  . Sexually Abused: No    Chart Review Today: I personally reviewed active problem list, medication list, allergies, family history, social history, health maintenance, notes from last encounter, lab results, imaging with the patient/caregiver today.   Review of Systems  10 Systems reviewed and are negative for acute change except as noted in the HPI.     Objective:   Vitals:   10/21/19 1214  BP: 124/62  Pulse: 81  Resp: 14  Temp: 98.1 F (36.7 C)  SpO2: 98%  Weight: 187 lb 9.6 oz (85.1 kg)  Height: 5\' 4"  (1.626 m)    Body mass index is 32.2 kg/m.  Physical Exam Vitals and nursing note reviewed.  Constitutional:      General: She is not in acute distress.    Appearance: Normal appearance. She is well-developed. She is not ill-appearing, toxic-appearing or diaphoretic.     Interventions: Face mask in place.  HENT:     Head: Normocephalic and atraumatic.     Right Ear: External ear normal.     Left Ear: External ear normal.  Eyes:     General: Lids are normal. No scleral icterus.       Right eye: No discharge.        Left eye: No discharge.     Conjunctiva/sclera: Conjunctivae normal.  Neck:     Trachea: Phonation normal. No tracheal deviation.  Cardiovascular:     Rate and Rhythm: Normal rate and regular rhythm.     Pulses:          Radial pulses are 2+ on the right side and 2+ on the left side.       Posterior tibial pulses are 2+ on  the right side and 2+ on the left side.     Heart sounds: Normal heart sounds.  Pulmonary:     Effort: Pulmonary effort is normal. No respiratory distress.     Breath sounds: Normal breath sounds. No stridor. No wheezing or rhonchi.  Abdominal:     General: Bowel sounds are normal. There is no distension.  Musculoskeletal:     Right elbow: Swelling present. No deformity, effusion or lacerations. Decreased range of motion. Tenderness present.     Cervical back: Normal range of motion and neck supple.     Right lower leg: No edema.     Left lower leg: No edema.  Lymphadenopathy:     Cervical: No cervical adenopathy.  Skin:    General: Skin is warm and dry.     Capillary Refill: Capillary refill takes less than 2 seconds.     Coloration: Skin is not jaundiced or pale.  Findings: No rash.  Neurological:     Mental Status: She is alert. Mental status is at baseline.     Motor: No abnormal muscle tone.  Psychiatric:        Mood and Affect: Mood and affect normal.        Speech: Speech normal.        Behavior: Behavior is hyperactive. Behavior is cooperative.         Results for orders placed or performed during the hospital encounter of 09/01/19  Protime-INR  Result Value Ref Range   Prothrombin Time 12.4 11.4 - 15.2 seconds   INR 0.9 0.8 - 1.2  APTT  Result Value Ref Range   aPTT 24 24 - 36 seconds  CBC  Result Value Ref Range   WBC 6.7 4.0 - 10.5 K/uL   RBC 4.07 3.87 - 5.11 MIL/uL   Hemoglobin 12.1 12.0 - 15.0 g/dL   HCT 36.5 36.0 - 46.0 %   MCV 89.7 80.0 - 100.0 fL   MCH 29.7 26.0 - 34.0 pg   MCHC 33.2 30.0 - 36.0 g/dL   RDW 12.6 11.5 - 15.5 %   Platelets 232 150 - 400 K/uL   nRBC 0.0 0.0 - 0.2 %  Differential  Result Value Ref Range   Neutrophils Relative % 55 %   Neutro Abs 3.7 1.7 - 7.7 K/uL   Lymphocytes Relative 33 %   Lymphs Abs 2.2 0.7 - 4.0 K/uL   Monocytes Relative 8 %   Monocytes Absolute 0.5 0.1 - 1.0 K/uL   Eosinophils Relative 2 %    Eosinophils Absolute 0.1 0.0 - 0.5 K/uL   Basophils Relative 1 %   Basophils Absolute 0.1 0.0 - 0.1 K/uL   Immature Granulocytes 1 %   Abs Immature Granulocytes 0.05 0.00 - 0.07 K/uL  Comprehensive metabolic panel  Result Value Ref Range   Sodium 135 135 - 145 mmol/L   Potassium 5.4 (H) 3.5 - 5.1 mmol/L   Chloride 100 98 - 111 mmol/L   CO2 24 22 - 32 mmol/L   Glucose, Bld 182 (H) 70 - 99 mg/dL   BUN 23 8 - 23 mg/dL   Creatinine, Ser 1.19 (H) 0.44 - 1.00 mg/dL   Calcium 9.1 8.9 - 10.3 mg/dL   Total Protein 7.9 6.5 - 8.1 g/dL   Albumin 4.2 3.5 - 5.0 g/dL   AST 22 15 - 41 U/L   ALT 12 0 - 44 U/L   Alkaline Phosphatase 90 38 - 126 U/L   Total Bilirubin 0.5 0.3 - 1.2 mg/dL   GFR calc non Af Amer 47 (L) >60 mL/min   GFR calc Af Amer 55 (L) >60 mL/min   Anion gap 11 5 - 15  Sedimentation rate  Result Value Ref Range   Sed Rate 27 0 - 30 mm/hr        Assessment & Plan:      ICD-10-CM   1. Pre-operative clearance  Z01.818    Paperwork will be completed and faxed back to her surgeon's office regarding Plavix -hold for 5 days, risks reviewed with the patient her verbalizes understandi  2. B12 deficiency  E53.8 cyanocobalamin ((VITAMIN B-12)) injection 1,000 mcg   B12 injection given today  3. Right elbow pain  M25.521 DG Elbow Complete Right   Right elbow is significantly more swollen with decreased range of motion, unlikely to be broken but sig tender, will recheck - refer to ortho      glycemic  control for surgery - per endocrinology - I did refill the patient's insulin last time she requested but diabetes is managed by her endocrinologist although she has had some elevated blood sugar she reports this is a medication side effect and her last A1c per care everywhere August 18, 2019 was nearly controlled at 7.2  Did consult with my supervising physician regarding holding antiplatelet medications for this patient,  Delsa Grana, PA-C 10/21/19 12:17 PM

## 2019-10-21 NOTE — Telephone Encounter (Signed)
   Primary Cardiologist: Kate Sable, MD  Chart reviewed as part of pre-operative protocol coverage.  Victoria Austin was seen on 10/21/2019 by Dr. Garen Lah for preoperative clearance.   Per Dr. Garen Lah:  "Patient presents for preop cardiac eval follow-up.  Had elevated RCRI.  She denies symptoms of chest pain or shortness of breath.  Lexiscan myocardial perfusion imaging stress test did not show any evidence for ischemia.  Ejection fraction from Myoview was normal at 55 to 65%.  No further cardiac testing or intervention will mitigate patient's risk.  She can thus proceed with her surgical procedure as planned from a cardiac perspective at acceptable risk."  We will not address holding plavix as this is prescribed by her PCP for stroke and not for cardiac reason.  Please refer to PCP for this information.   Please call with questions.  Daune Perch, NP 10/21/2019, 4:53 PM

## 2019-10-21 NOTE — Pre-Procedure Instructions (Signed)
CARDIAC CLEARANCE IN Epic FROM DR Phillis Knack

## 2019-10-21 NOTE — Progress Notes (Signed)
Cardiology Office Note:    Date:  10/21/2019   ID:  Victoria Austin, DOB [birth year], MRN [id number]  PCP:  Hubbard Hartshorn, FNP  Cardiologist:  No primary care provider on file.  Electrophysiologist:  None   Referring MD: Hubbard Hartshorn, FNP   Chief Complaint  Patient presents with  . office visit    Surgical clearance; Meds verbally reviewed with patient.    History of Present Illness:   Victoria Austin is a 69 y.o. female with a hx of hypertension, diabetes, hyperlipidemia, CVA 2019, Hodgkin's lymphoma who presents for follow-up.  She was last seen for presurgical evaluation prior to lumbar spinal surgery.  Patient has a history of vertigo and dizziness.  She had a fall earlier in the year around March injuring her back by fracturing her L1 vertebrae.  Surgery is being planned.  She denies any history of heart disease.  Denies chest pain or shortness of breath with exertion or at rest.  She has prior TIAs, and had a stroke last year in the fall. she does not have any residual deficits.  She takes insulin for her diabetes.  She had back surgery earlier last year due to spinal stenosis and tolerated the procedure well.  This was before she had a stroke.  She has a history of Hodgkin's lymphoma status post chemotherapy 6 cycles finished in 2014.  Has stayed in remission since then.  Her revised cardiac risk index was elevated due to strokes and insulin-dependent diabetes.  A Lexiscan stress test was ordered.  Echo is pending.  Past Medical History:  Diagnosis Date  . Anemia    vitamin d deficiency  . Anxiety    ER visit on 10/15/19 for panic attack, sob, shaking  . Arthritis   . Bipolar 1 disorder ([place]) 2006  . Chronic kidney disease    stage III  . Depression   . Diabetes mellitus without complication ([place])    on insulin.  . Fibromyalgia   . GERD (gastroesophageal reflux disease)   . Hereditary essential tremor   . History of hiatal hernia   . Hodgkin's lymphoma ([place]) 2014   SKIN  CANCER ALSO  . Hyperlipidemia   . Hypertension   . IBS (irritable bowel syndrome)   . IBS (irritable bowel syndrome)   . Incontinence   . Neuropathy   . Osteopenia   . RA (rheumatoid arthritis) ([place])   . Scoliosis   . Sleep apnea    uses cpap  . Stroke [facility]) 2019   several tia's prior to stroke.  on plavix  . Temporal arteritis [facility])     Past Surgical History:  Procedure Laterality Date  . BACK SURGERY    . CESAREAN SECTION    . ELBOW ARTHROSCOPY    . FINGER SURGERY    . HAND SURGERY    . HERNIA REPAIR     UMBILICAL REPAIR  . SPINE SURGERY      Current Medications: Current Meds  Medication Sig  . ADVAIR HFA 230-21 MCG/ACT inhaler Inhale 1 puff into the lungs 2 (two) times daily. (Patient taking differently: Inhale 1 puff into the lungs 2 (two) times daily as needed. )  . albuterol (PROAIR HFA) 108 (90 Base) MCG/ACT inhaler Inhale 1 puff into the lungs 2 (two) times daily as needed.  Marland Kitchen alendronate (FOSAMAX) 70 MG tablet Take 70 mg by mouth once a week.   . carbamazepine (TEGRETOL) 200 MG tablet Take 200 mg by mouth 3 (three) times daily.   Marland Kitchen  cloNIDine (CATAPRES) 0.1 MG tablet Take 1 tablet (0.1 mg total) by mouth daily as needed (blood pressure >180/100).  . clopidogrel (PLAVIX) 75 MG tablet Take 1 tablet (75 mg total) by mouth daily.  . Continuous Blood Gluc Receiver (FREESTYLE LIBRE 14 DAY READER) DEVI 1 Device by Does not apply route 4 (four) times daily.  . cyanocobalamin (,VITAMIN B-12,) 1000 MCG/ML injection Inject 1,000 mcg into the muscle every 30 (thirty) days.   Marland Kitchen docusate sodium (COLACE) 250 MG capsule Take 250 mg by mouth daily.   . DULoxetine (CYMBALTA) 60 MG capsule Take 60 mg by mouth daily.   . empagliflozin (JARDIANCE) 10 MG TABS tablet Take 10 mg by mouth daily.  Marland Kitchen EPINEPHrine (EPIPEN 2-PAK) 0.3 mg/0.3 mL IJ SOAJ injection Inject 0.3 mLs (0.3 mg total) into the muscle as needed for anaphylaxis. Then call 911 for ER transport.  . gabapentin (NEURONTIN) 100  MG capsule Take 300 mg PO at bedtime x 7d, then 200 mg PO at bedtime x 7 d, then 100 mg po at bedtime x 7 d (Patient taking differently: Take 300 mg by mouth at bedtime. )  . hydrALAZINE (APRESOLINE) 50 MG tablet Take 1 tablet (50 mg total) by mouth 3 (three) times daily.  Vanessa Kick Ethyl 1 g CAPS Take 2 capsules (2 g total) by mouth 2 (two) times daily.  . Insulin Glargine (LANTUS SOLOSTAR) 100 UNIT/ML Solostar Pen Inject 85 Units into the skin daily. (Patient taking differently: Inject 85 Units into the skin at bedtime. )  . lisinopril (ZESTRIL) 5 MG tablet Take 5 mg by mouth daily.   Marland Kitchen loratadine (CLARITIN) 10 MG tablet Take 1 tablet (10 mg total) by mouth daily.  Marland Kitchen LORazepam (ATIVAN) 1 MG tablet Take 1 mg by mouth daily as needed for anxiety or sleep.   Marland Kitchen MAGNESIUM-OXIDE 400 (241.3 Mg) MG tablet Take 1 tablet (400 mg total) by mouth daily. (Patient taking differently: Take 400 mg by mouth at bedtime. )  . meclizine (ANTIVERT) 25 MG tablet Take 1 tablet (25 mg total) by mouth 3 (three) times daily as needed for dizziness. (Patient taking differently: Take 25-50 mg by mouth See admin instructions. Take 50 mg by mouth in the morning and 25 mg in the afternoon)  . mirabegron ER (MYRBETRIQ) 50 MG TB24 tablet Take 1 tablet (50 mg total) by mouth daily. (Patient taking differently: Take 100 mg by mouth daily. )  . montelukast (SINGULAIR) 10 MG tablet Take 1 tablet (10 mg total) by mouth daily.  Marland Kitchen NIFEdipine (ADALAT CC) 60 MG 24 hr tablet Take 1 tablet (60 mg total) by mouth 2 (two) times daily.  Marland Kitchen NOVOLOG FLEXPEN 100 UNIT/ML FlexPen Inject 22-34 Units into the skin 3 (three) times daily. With meals  . pantoprazole (PROTONIX) 40 MG tablet Take 1 tablet (40 mg total) by mouth daily.  . primidone (MYSOLINE) 50 MG tablet Take 4 tablets (200 mg total) by mouth at bedtime.  . promethazine (PHENERGAN) 25 MG tablet Take 1 tablet (25 mg total) by mouth every 6 (six) hours as needed.  . propranolol (INDERAL) 10  MG tablet Take 1 tablet (10 mg total) by mouth 2 (two) times daily. (Patient taking differently: Take 10 mg by mouth daily. )  . rosuvastatin (CRESTOR) 20 MG tablet Take 1 tablet (20 mg total) by mouth daily.  . SURE COMFORT PEN NEEDLES 31G X 5 MM MISC Inject 1 pen as directed 5 (five) times daily as needed.  . trimethoprim (TRIMPEX) 100  MG tablet Take 1 tablet (100 mg total) by mouth daily.  . Vitamin D, Ergocalciferol, (DRISDOL) 1.25 MG (50000 UNIT) CAPS capsule Take 1 capsule (50,000 Units total) by mouth once a week.     Allergies:   Fire ant, Ciprofloxacin, Cyclobenzaprine, Diazepam, Ketorolac, Ondansetron, Ranitidine hcl, Ambien [zolpidem], Bacitracin-polymyxin b, Mushroom extract complex, and Neomycin-polymyxin-gramicidin   Social History   Socioeconomic History  . Marital status: Legally Separated    Spouse name: Not on file  . Number of children: 2  . Years of education: Not on file  . Highest education level: Not on file  Occupational History  . Occupation: retired  Tobacco Use  . Smoking status: Never Smoker  . Smokeless tobacco: Never Used  Substance and Sexual Activity  . Alcohol use: Never  . Drug use: Never  . Sexual activity: Not Currently    Partners: Male  Other Topics Concern  . Not on file  Social History Narrative   Patient lives with daughter and 65 year old grandson.   Would like to have some home assistance after back surgery as her daughter works 12 hour days       And no one will be there to help her out.   Social Determinants of Health   Financial Resource Strain: Low Risk   . Difficulty of Paying Living Expenses: Not hard at all  Food Insecurity: No Food Insecurity  . Worried About Charity fundraiser in the Last Year: Never true  . Ran Out of Food in the Last Year: Never true  Transportation Needs: No Transportation Needs  . Lack of Transportation (Medical): No  . Lack of Transportation (Non-Medical): No  Physical Activity: Inactive  . Days of  Exercise per Week: 0 days  . Minutes of Exercise per Session: 0 min  Stress: Stress Concern Present  . Feeling of Stress : Very much  Social Connections: Moderately Isolated  . Frequency of Communication with Friends and Family: More than three times a week  . Frequency of Social Gatherings with Friends and Family: More than three times a week  . Attends Religious Services: Never  . Active Member of Clubs or Organizations: No  . Attends Archivist Meetings: Never  . Marital Status: Separated     Family History: The patient's family history includes Bone cancer in her maternal aunt; Colon cancer in her maternal uncle; Hypertension in her brother, mother, and sister; Lung cancer in her mother.  ROS:   Please see the history of present illness.     All other systems reviewed and are negative.  EKGs/Labs/Other Studies Reviewed:    The following studies were reviewed today:   EKG:  EKG is  ordered today.  The ekg ordered today demonstrates normal sinus rhythm, normal ECG.  Recent Labs: 03/01/2019: Magnesium 1.6 07/20/2019: TSH 1.87 09/01/2019: ALT 12; BUN 23; Creatinine, Ser 1.19; Hemoglobin 12.1; Platelets 232; Potassium 5.4; Sodium 135  Recent Lipid Panel    Component Value Date/Time   CHOL 186 03/01/2019 1140   TRIG 413 (H) 03/01/2019 1140   HDL 39 (L) 03/01/2019 1140   CHOLHDL 4.8 03/01/2019 1140   LDLCALC  03/01/2019 1140     Comment:     . LDL cholesterol not calculated. Triglyceride levels greater than 400 mg/dL invalidate calculated LDL results. . Reference range: <100 . Desirable range <100 mg/dL for primary prevention;   <70 mg/dL for patients with CHD or diabetic patients  with > or = 2 CHD risk factors. Marland Kitchen  LDL-C is now calculated using the Martin-Hopkins  calculation, which is a validated novel method providing  better accuracy than the Friedewald equation in the  estimation of LDL-C.  Cresenciano Genre et al. Annamaria Helling. [id number]): 2061-2068    (http://education.[employer].com/faq/FAQ164)     Physical Exam:    VS:  BP 140/74 (BP Location: Left Arm, Patient Position: Sitting, Cuff Size: Normal)   Pulse 79   Ht 5\' 5"  (1.651 m)   Wt 187 lb (84.8 kg)   SpO2 98%   BMI 31.12 kg/m     Wt Readings from Last 3 Encounters:  10/21/19 187 lb (84.8 kg)  10/15/19 181 lb (82.1 kg)  10/08/19 182 lb (82.6 kg)     GEN:  Well nourished, well developed in no acute distress HEENT: Normal NECK: No JVD; No carotid bruits LYMPHATICS: No lymphadenopathy CARDIAC: RRR, no murmurs, rubs, gallops RESPIRATORY:  Clear to auscultation without rales, wheezing or rhonchi  ABDOMEN: Soft, non-tender, non-distended MUSCULOSKELETAL:  No edema; No deformity  SKIN: Warm and dry NEUROLOGIC:  Alert and oriented x 3 PSYCHIATRIC:  Normal affect   ASSESSMENT:    1. Pre-op evaluation   2. Essential hypertension   3. Pure hypercholesterolemia    PLAN:    In order of problems listed above:  1. Patient presents for preop cardiac eval follow-up.  Had elevated RCRI.  She denies symptoms of chest pain or shortness of breath.  Lexiscan myocardial perfusion imaging stress test did not show any evidence for ischemia.  Ejection fraction from Myoview was normal at 55 to 65%.  No further cardiac testing or intervention will mitigate patient's risk.  She can thus proceed with her surgical procedure as planned from a cardiac perspective at acceptable risk. 2. Blood pressure is reasonably controlled.  Continue current BP meds. 3. History of hyperlipidemia.  Continue statin.  Follow-up in 6 months after TTE.  Medication Adjustments/Labs and Tests Ordered: Current medicines are reviewed at length with the patient today.  Concerns regarding medicines are outlined above.  No orders of the defined types were placed in this encounter.  No orders of the defined types were placed in this encounter.   There are no Patient Instructions on file for this visit.    Signed, Kate Sable, MD  10/21/2019 9:16 AM    [place]

## 2019-10-21 NOTE — Patient Instructions (Signed)
Medication Instructions:  None  *If you need a refill on your cardiac medications before your next appointment, please call your pharmacy*   Lab Work: None  If you have labs (blood work) drawn today and your tests are completely normal, you will receive your results only by: Marland Kitchen MyChart Message (if you have MyChart) OR . A paper copy in the mail If you have any lab test that is abnormal or we need to change your treatment, we will call you to review the results.   Testing/Procedures: None    Follow-Up: At [facility], you and your health needs are our priority.  As part of our continuing mission to provide you with exceptional heart care, we have created designated Provider Care Teams.  These Care Teams include your primary Cardiologist (physician) and Advanced Practice Providers (APPs -  Physician Assistants and Nurse Practitioners) who all work together to provide you with the care you need, when you need it.   Your next appointment:   6 month(s)  The format for your next appointment:   In Person  Provider:    You may see Dr. Garen Lah or one of the following Advanced Practice Providers on your designated Care Team:    Murray Hodgkins, NP  Christell Faith, PA-C  Marrianne Mood, PA-C

## 2019-10-22 ENCOUNTER — Encounter: Payer: Self-pay | Admitting: Oncology

## 2019-10-22 ENCOUNTER — Other Ambulatory Visit: Payer: Self-pay

## 2019-10-22 ENCOUNTER — Inpatient Hospital Stay (HOSPITAL_BASED_OUTPATIENT_CLINIC_OR_DEPARTMENT_OTHER): Payer: Medicare Other | Admitting: Oncology

## 2019-10-22 ENCOUNTER — Encounter: Payer: Self-pay | Admitting: Family Medicine

## 2019-10-22 DIAGNOSIS — E1122 Type 2 diabetes mellitus with diabetic chronic kidney disease: Secondary | ICD-10-CM | POA: Diagnosis not present

## 2019-10-22 DIAGNOSIS — Z8579 Personal history of other malignant neoplasms of lymphoid, hematopoietic and related tissues: Secondary | ICD-10-CM

## 2019-10-22 DIAGNOSIS — I129 Hypertensive chronic kidney disease with stage 1 through stage 4 chronic kidney disease, or unspecified chronic kidney disease: Secondary | ICD-10-CM | POA: Diagnosis not present

## 2019-10-22 DIAGNOSIS — M549 Dorsalgia, unspecified: Secondary | ICD-10-CM | POA: Diagnosis not present

## 2019-10-22 DIAGNOSIS — N1831 Chronic kidney disease, stage 3a: Secondary | ICD-10-CM

## 2019-10-22 DIAGNOSIS — D631 Anemia in chronic kidney disease: Secondary | ICD-10-CM | POA: Diagnosis not present

## 2019-10-22 DIAGNOSIS — G8929 Other chronic pain: Secondary | ICD-10-CM | POA: Diagnosis not present

## 2019-10-22 LAB — IRON AND TIBC
Iron: 57 ug/dL (ref 28–170)
Saturation Ratios: 18 % (ref 10.4–31.8)
TIBC: 323 ug/dL (ref 250–450)
UIBC: 266 ug/dL

## 2019-10-22 LAB — FERRITIN: Ferritin: 52 ng/mL (ref 11–307)

## 2019-10-22 MED ORDER — SURE COMFORT PEN NEEDLES 31G X 5 MM MISC: 1.0000 "pen " | Freq: Every day | 3 refills | Status: DC | PRN

## 2019-10-22 NOTE — Progress Notes (Signed)
Patient has viewed her lab results from yesterday on MyChart and she is anxious to discuss the results.

## 2019-10-22 NOTE — Telephone Encounter (Signed)
Hypertension medication request:  Last office visit pertaining to hypertension:  BP Readings from Last 3 Encounters:  10/21/19 124/62  10/21/19 140/74  09/21/19 134/82    Lab Results  Component Value Date   CREATININE 1.03 (H) 10/21/2019   BUN 22 10/21/2019   NA 137 10/21/2019   K 4.9 10/21/2019   CL 106 10/21/2019   CO2 23 10/21/2019     No follow-ups on file.

## 2019-10-22 NOTE — Progress Notes (Signed)
HEMATOLOGY-ONCOLOGY TeleHEALTH VISIT PROGRESS NOTE  I connected with Victoria Austin on 10/22/19 at  2:00 PM EDT by video enabled telemedicine visit and verified that I am speaking with the correct person using two identifiers. I discussed the limitations, risks, security and privacy concerns of performing an evaluation and management service by telemedicine and the availability of in-person appointments. I also discussed with the patient that there may be a patient responsible charge related to this service. The patient expressed understanding and agreed to proceed.   Other persons participating in the visit and their role in the encounter:  None  Patient's location: Home  Provider's location: office Chief Complaint: follow up for anemia, history of Hodgkin's lymphoma   INTERVAL HISTORY Victoria Austin is a 69 y.o. female who has above history reviewed by me today presents for follow up visit for management of anemia, history of Hodgkin's lymphoma Problems and complaints are listed below:  She reports that she continues to have chronic joint and back pain. Denies weight loss, fever, chills, fatigue, night sweats.  She follows up with neurosurgery Dr. Cari Austin and is to have elective surgery on 11/03/2019.  Review of Systems  Constitutional: Negative for appetite change, chills, fatigue and fever.  HENT:   Negative for hearing loss and voice change.   Eyes: Negative for eye problems.  Respiratory: Negative for chest tightness and cough.   Cardiovascular: Negative for chest pain.  Gastrointestinal: Negative for abdominal distention, abdominal pain and blood in stool.  Endocrine: Negative for hot flashes.  Genitourinary: Negative for difficulty urinating and frequency.   Musculoskeletal: Positive for back pain. Negative for arthralgias.  Skin: Negative for itching and rash.  Neurological: Negative for extremity weakness.  Hematological: Negative for adenopathy.  Psychiatric/Behavioral:  Negative for confusion.    Past Medical History:  Diagnosis Date  . Anemia    vitamin d deficiency  . Anxiety    ER visit on 10/15/19 for panic attack, sob, shaking  . Arthritis   . Bipolar 1 disorder ([place]) 2006  . Chronic kidney disease    stage III  . Depression   . Diabetes mellitus without complication ([place])    on insulin.  . Fibromyalgia   . GERD (gastroesophageal reflux disease)   . Hereditary essential tremor   . History of hiatal hernia   . Hodgkin's lymphoma ([place]) 2014   SKIN CANCER ALSO  . Hyperlipidemia   . Hypertension   . IBS (irritable bowel syndrome)   . IBS (irritable bowel syndrome)   . Incontinence   . Neuropathy   . Osteopenia   . RA (rheumatoid arthritis) ([place])   . Scoliosis   . Sleep apnea    uses cpap  . Stroke [facility]) 2019   several tia's prior to stroke.  on plavix  . Temporal arteritis [facility])    Past Surgical History:  Procedure Laterality Date  . BACK SURGERY    . CESAREAN SECTION    . ELBOW ARTHROSCOPY    . FINGER SURGERY    . HAND SURGERY    . HERNIA REPAIR     UMBILICAL REPAIR  . SPINE SURGERY      Family History  Problem Relation Age of Onset  . Lung cancer Mother   . Hypertension Mother   . Hypertension Sister   . Hypertension Brother   . Bone cancer Maternal Aunt   . Colon cancer Maternal Uncle     Social History   Socioeconomic History  . Marital status: Legally Separated  Spouse name: Not on file  . Number of children: 2  . Years of education: Not on file  . Highest education level: Not on file  Occupational History  . Occupation: retired  Tobacco Use  . Smoking status: Never Smoker  . Smokeless tobacco: Never Used  Substance and Sexual Activity  . Alcohol use: Never  . Drug use: Never  . Sexual activity: Not Currently    Partners: Male  Other Topics Concern  . Not on file  Social History Narrative   Patient lives with daughter and 33 year old grandson.   Would like to have some home assistance after back  surgery as her daughter works 12 hour days       And no one will be there to help her out.   Social Determinants of Health   Financial Resource Strain: Low Risk   . Difficulty of Paying Living Expenses: Not hard at all  Food Insecurity: No Food Insecurity  . Worried About Charity fundraiser in the Last Year: Never true  . Ran Out of Food in the Last Year: Never true  Transportation Needs: No Transportation Needs  . Lack of Transportation (Medical): No  . Lack of Transportation (Non-Medical): No  Physical Activity: Inactive  . Days of Exercise per Week: 0 days  . Minutes of Exercise per Session: 0 min  Stress: Stress Concern Present  . Feeling of Stress : Very much  Social Connections: Moderately Isolated  . Frequency of Communication with Friends and Family: More than three times a week  . Frequency of Social Gatherings with Friends and Family: More than three times a week  . Attends Religious Services: Never  . Active Member of Clubs or Organizations: No  . Attends Archivist Meetings: Never  . Marital Status: Separated  Intimate Partner Violence: Not At Risk  . Fear of Current or Ex-Partner: No  . Emotionally Abused: No  . Physically Abused: No  . Sexually Abused: No    Current Outpatient Medications on File Prior to Visit  Medication Sig Dispense Refill  . ADVAIR HFA 230-21 MCG/ACT inhaler Inhale 1 puff into the lungs 2 (two) times daily. (Patient taking differently: Inhale 1 puff into the lungs 2 (two) times daily as needed. ) 1 Inhaler 3  . albuterol (PROAIR HFA) 108 (90 Base) MCG/ACT inhaler Inhale 1 puff into the lungs 2 (two) times daily as needed. 18 g 1  . alendronate (FOSAMAX) 70 MG tablet Take 70 mg by mouth once a week.     . carbamazepine (TEGRETOL) 200 MG tablet Take 200 mg by mouth 3 (three) times daily.     . cloNIDine (CATAPRES) 0.1 MG tablet Take 1 tablet (0.1 mg total) by mouth daily as needed (blood pressure >180/100). 60 tablet 3  . clopidogrel  (PLAVIX) 75 MG tablet Take 1 tablet (75 mg total) by mouth daily. 90 tablet 1  . Continuous Blood Gluc Receiver (FREESTYLE LIBRE 14 DAY READER) DEVI 1 Device by Does not apply route 4 (four) times daily. 1 Device 0  . Continuous Blood Gluc Sensor (FREESTYLE LIBRE 14 DAY SENSOR) MISC 1 Device by Does not apply route every 14 (fourteen) days. 6 each 1  . cyanocobalamin (,VITAMIN B-12,) 1000 MCG/ML injection Inject 1,000 mcg into the muscle every 30 (thirty) days.     Marland Kitchen docusate sodium (COLACE) 250 MG capsule Take 250 mg by mouth daily.     . DULoxetine (CYMBALTA) 60 MG capsule Take 60 mg by  mouth daily.     . empagliflozin (JARDIANCE) 10 MG TABS tablet Take 10 mg by mouth daily.    Marland Kitchen EPINEPHrine (EPIPEN 2-PAK) 0.3 mg/0.3 mL IJ SOAJ injection Inject 0.3 mLs (0.3 mg total) into the muscle as needed for anaphylaxis. Then call 911 for ER transport. 0.3 mL 1  . gabapentin (NEURONTIN) 100 MG capsule Take 300 mg PO at bedtime x 7d, then 200 mg PO at bedtime x 7 d, then 100 mg po at bedtime x 7 d (Patient taking differently: Take 300 mg by mouth at bedtime. ) 60 capsule 1  . hydrALAZINE (APRESOLINE) 50 MG tablet Take 1 tablet (50 mg total) by mouth 3 (three) times daily. 270 tablet 3  . Icosapent Ethyl 1 g CAPS Take 2 capsules (2 g total) by mouth 2 (two) times daily. 360 capsule 3  . Insulin Glargine (LANTUS SOLOSTAR) 100 UNIT/ML Solostar Pen Inject 85 Units into the skin daily. (Patient taking differently: Inject 85 Units into the skin at bedtime. ) 10 pen 3  . lisinopril (ZESTRIL) 5 MG tablet Take 5 mg by mouth daily.     Marland Kitchen loratadine (CLARITIN) 10 MG tablet Take 1 tablet (10 mg total) by mouth daily. 90 tablet 1  . LORazepam (ATIVAN) 1 MG tablet Take 1 mg by mouth daily as needed for anxiety or sleep.     . meclizine (ANTIVERT) 25 MG tablet Take 1 tablet (25 mg total) by mouth 3 (three) times daily as needed for dizziness. (Patient taking differently: Take 25-50 mg by mouth See admin instructions. Take 50  mg by mouth in the morning and 25 mg in the afternoon) 180 tablet 1  . mirabegron ER (MYRBETRIQ) 50 MG TB24 tablet Take 1 tablet (50 mg total) by mouth daily. (Patient taking differently: Take 100 mg by mouth daily. ) 90 tablet 3  . montelukast (SINGULAIR) 10 MG tablet Take 1 tablet (10 mg total) by mouth daily. 90 tablet 0  . NIFEdipine (ADALAT CC) 60 MG 24 hr tablet Take 1 tablet (60 mg total) by mouth 2 (two) times daily. 180 tablet 1  . NOVOLOG FLEXPEN 100 UNIT/ML FlexPen Inject 22-34 Units into the skin 3 (three) times daily. With meals 3 pen 3  . pantoprazole (PROTONIX) 40 MG tablet Take 1 tablet (40 mg total) by mouth daily. 90 tablet 1  . primidone (MYSOLINE) 50 MG tablet Take 4 tablets (200 mg total) by mouth at bedtime.    . promethazine (PHENERGAN) 25 MG tablet Take 1 tablet (25 mg total) by mouth every 6 (six) hours as needed. 30 tablet 1  . propranolol (INDERAL) 10 MG tablet Take 1 tablet (10 mg total) by mouth 2 (two) times daily. (Patient taking differently: Take 10 mg by mouth daily. ) 180 tablet 1  . rosuvastatin (CRESTOR) 20 MG tablet Take 1 tablet (20 mg total) by mouth daily. 90 tablet 3  . trimethoprim (TRIMPEX) 100 MG tablet Take 1 tablet (100 mg total) by mouth daily. 90 tablet 3  . Vitamin D, Ergocalciferol, (DRISDOL) 1.25 MG (50000 UNIT) CAPS capsule Take 1 capsule (50,000 Units total) by mouth once a week. 12 capsule 1  . MAGNESIUM-OXIDE 400 (241.3 Mg) MG tablet Take 1 tablet (400 mg total) by mouth daily. (Patient not taking: Reported on 10/22/2019) 90 tablet 1  . methocarbamol (ROBAXIN) 750 MG tablet Take 1 tablet (750 mg total) by mouth 2 (two) times daily as needed for muscle spasms. (Patient not taking: Reported on 10/15/2019) 180 tablet 1  No current facility-administered medications on file prior to visit.    Allergies  Allergen Reactions  . [employer] Anaphylaxis    BEE STINGS ALSO  . Ciprofloxacin Nausea And Vomiting  . Cyclobenzaprine Nausea And Vomiting  .  Diazepam Nausea And Vomiting  . Ketorolac     HALLUCINATIONS  . Ondansetron Nausea And Vomiting    ABRUPT VOMITTING  . Ranitidine Hcl     FEELS LIKE BUGS CRAWLING ALL OVER HER  . Ambien [Zolpidem] Other (See Comments)    Severely sedating  . Bacitracin-Polymyxin B     BURNS SKIN  . Mushroom Extract Complex     BELL PEPPERS TOO CAUSING ITCHING  . Neomycin-Polymyxin-Gramicidin Rash    BURNS SKIN       Observations/Objective: Today's Vitals   10/22/19 1349  PainSc: 0-No pain   There is no height or weight on file to calculate BMI.  Physical Exam  Constitutional: No distress.  Neurological: She is alert.    CBC    Component Value Date/Time   WBC 6.6 10/21/2019 1310   RBC 3.64 (L) 10/21/2019 1310   HGB 10.8 (L) 10/21/2019 1310   HCT 33.2 (L) 10/21/2019 1310   PLT 259 10/21/2019 1310   MCV 91.2 10/21/2019 1310   MCH 29.7 10/21/2019 1310   MCHC 32.5 10/21/2019 1310   RDW 12.7 10/21/2019 1310   LYMPHSABS 2.1 10/21/2019 1310   MONOABS 0.6 10/21/2019 1310   EOSABS 0.2 10/21/2019 1310   BASOSABS 0.1 10/21/2019 1310    CMP     Component Value Date/Time   NA 137 10/21/2019 1310   K 4.9 10/21/2019 1310   CL 106 10/21/2019 1310   CO2 23 10/21/2019 1310   GLUCOSE 197 (H) 10/21/2019 1310   BUN 22 10/21/2019 1310   CREATININE 1.03 (H) 10/21/2019 1310   CREATININE 1.12 (H) 04/14/2019 0000   CALCIUM 8.5 (L) 10/21/2019 1310   PROT 7.2 10/21/2019 1310   ALBUMIN 4.0 10/21/2019 1310   AST 20 10/21/2019 1310   ALT 12 10/21/2019 1310   ALKPHOS 110 10/21/2019 1310   BILITOT 0.3 10/21/2019 1310   GFRNONAA 56 (L) 10/21/2019 1310   GFRNONAA 51 (L) 04/14/2019 0000   GFRAA >60 10/21/2019 1310   GFRAA 59 (L) 04/14/2019 0000     Assessment and Plan: 1. Anemia of chronic renal failure, stage 3a   2. History of lymphoma     Labs reviewed and discussed with patient. Anemia, likely secondary to chronic kidney insufficiency. Check iron panel-which showed iron saturation of 18,  ferritin 52, adequate. Currently hemoglobin above 10 is acceptable in the setting of chronic kidney disease.  Remote history of Hodgkin's lymphoma, she does not have any constitutional symptoms. LDH is normal. ESR is chronically elevated and level at baseline.-Patient has rheumatoid arthritis. I discussed with patient about discharging her back to follow-up with primary care provider. Patient prefers to continue annual follow-up visit with me.  Follow Up Instructions: 1 year  I discussed the assessment and treatment plan with the patient. The patient was provided an opportunity to ask questions and all were answered. The patient agreed with the plan and demonstrated an understanding of the instructions.  The patient was advised to call back or seek an in-person evaluation if the symptoms worsen or if the condition fails to improve as anticipated.   Earlie Server, MD 10/22/2019 9:32 PM

## 2019-10-25 ENCOUNTER — Other Ambulatory Visit: Payer: Medicare Other

## 2019-10-25 ENCOUNTER — Other Ambulatory Visit: Payer: Self-pay

## 2019-10-25 ENCOUNTER — Encounter: Payer: Self-pay | Admitting: Family Medicine

## 2019-10-25 ENCOUNTER — Telehealth: Payer: Medicare Other | Admitting: Oncology

## 2019-10-25 ENCOUNTER — Other Ambulatory Visit
Admission: RE | Admit: 2019-10-25 | Discharge: 2019-10-25 | Disposition: A | Discharge status: Home / Self Care | Payer: Medicare Other | Source: Ambulatory Visit | Attending: Neurosurgery | Admitting: Neurosurgery

## 2019-10-25 DIAGNOSIS — Z01812 Encounter for preprocedural laboratory examination: Secondary | ICD-10-CM | POA: Insufficient documentation

## 2019-10-25 LAB — PROTIME-INR
INR: 1 (ref 0.8–1.2)
Prothrombin Time: 12.7 seconds (ref 11.4–15.2)

## 2019-10-25 LAB — CBC
HCT: 36 % (ref 36.0–46.0)
Hemoglobin: 11.8 g/dL — ABNORMAL LOW (ref 12.0–15.0)
MCH: 29.6 pg (ref 26.0–34.0)
MCHC: 32.8 g/dL (ref 30.0–36.0)
MCV: 90.2 fL (ref 80.0–100.0)
Platelets: 243 10*3/uL (ref 150–400)
RBC: 3.99 MIL/uL (ref 3.87–5.11)
RDW: 12.8 % (ref 11.5–15.5)
WBC: 6.8 10*3/uL (ref 4.0–10.5)
nRBC: 0 % (ref 0.0–0.2)

## 2019-10-25 LAB — URINALYSIS, ROUTINE W REFLEX MICROSCOPIC
Bacteria, UA: NONE SEEN
Bilirubin Urine: NEGATIVE
Glucose, UA: NEGATIVE mg/dL
Hgb urine dipstick: NEGATIVE
Ketones, ur: NEGATIVE mg/dL
Leukocytes,Ua: NEGATIVE
Nitrite: NEGATIVE
Protein, ur: 100 mg/dL — AB
Specific Gravity, Urine: 1.017 (ref 1.005–1.030)
pH: 5 (ref 5.0–8.0)

## 2019-10-25 LAB — TYPE AND SCREEN
ABO/RH(D): A POS
Antibody Screen: NEGATIVE

## 2019-10-25 LAB — SURGICAL PCR SCREEN
MRSA, PCR: NEGATIVE
Staphylococcus aureus: NEGATIVE

## 2019-10-25 LAB — BASIC METABOLIC PANEL
Anion gap: 10 (ref 5–15)
BUN: 18 mg/dL (ref 8–23)
CO2: 23 mmol/L (ref 22–32)
Calcium: 8.6 mg/dL — ABNORMAL LOW (ref 8.9–10.3)
Chloride: 104 mmol/L (ref 98–111)
Creatinine, Ser: 0.99 mg/dL (ref 0.44–1.00)
GFR calc Af Amer: >60 mL/min (ref >60)
GFR calc non Af Amer: 59 mL/min — ABNORMAL LOW (ref >60)
Glucose, Bld: 247 mg/dL — ABNORMAL HIGH (ref 70–99)
Potassium: 3.8 mmol/L (ref 3.5–5.1)
Sodium: 137 mmol/L (ref 135–145)

## 2019-10-25 LAB — APTT: aPTT: 27 seconds (ref 24–36)

## 2019-10-25 MED ORDER — SURE COMFORT PEN NEEDLES 31G X 5 MM MISC: 1.0000 "pen " | Freq: Every day | 0 refills | Status: DC | PRN

## 2019-10-25 NOTE — Pre-Procedure Instructions (Signed)
Pre-Admit Testing Provider Notification Note  Provider Notified: Dr. Izora Ribas  Notification Mode: Fax  Reason: Abnormal UA Result  Response: Fax confirmation received.  Additional Information: Placed on Chart. Noted on Pre-Admit Worksheet.  Signed: Beulah Gandy, RN

## 2019-10-26 MED ORDER — LISINOPRIL 5 MG PO TABS: 5.0000 mg | ORAL_TABLET | Freq: Every day | ORAL | 3 refills | Status: DC

## 2019-10-26 NOTE — Pre-Procedure Instructions (Addendum)
CLEARANCE UPDATE   Cardiology Clearance: Received   Medical Clearance: Form received back from Delsa Grana, PA-C. She provided instructions for Plavix, but made further recommendations for Endocrinology Clearance.     Pre-Admit Testing Provider Notification Note  Provider Notified: Dr. Lucilla Lame, Endocrinologist  Notification Mode: Fax  Reason: Endocrinology Clearance Request  Response: Fax confirmation received.  Additional Information: Placed on chart. Noted on Pre-Admit Worksheet.  Signed: Beulah Gandy, RN

## 2019-10-28 ENCOUNTER — Encounter: Payer: Self-pay | Admitting: Family Medicine

## 2019-10-28 NOTE — Pre-Procedure Instructions (Signed)
Called over to Dr Joycie Peek office regarding clearance. Office states they will relay this info to nurse and she will call me back.

## 2019-10-29 ENCOUNTER — Encounter: Payer: Self-pay | Admitting: Family Medicine

## 2019-11-01 ENCOUNTER — Other Ambulatory Visit: Payer: Self-pay

## 2019-11-01 ENCOUNTER — Other Ambulatory Visit
Admission: RE | Admit: 2019-11-01 | Discharge: 2019-11-01 | Disposition: A | Discharge status: Home / Self Care | Payer: Medicare Other | Source: Ambulatory Visit | Attending: Neurosurgery | Admitting: Neurosurgery

## 2019-11-01 DIAGNOSIS — Z01812 Encounter for preprocedural laboratory examination: Secondary | ICD-10-CM | POA: Insufficient documentation

## 2019-11-01 DIAGNOSIS — Z20822 Contact with and (suspected) exposure to covid-19: Secondary | ICD-10-CM | POA: Insufficient documentation

## 2019-11-01 LAB — SARS CORONAVIRUS 2 (TAT 6-24 HRS): SARS Coronavirus 2: NEGATIVE

## 2019-11-01 NOTE — Pre-Procedure Instructions (Signed)
Called Dr. Joycie Peek office regarding clearance. Receptionist states that the nurse is aware. Will address with Dr. Gabriel Carina and fax back today.

## 2019-11-01 NOTE — Pre-Procedure Instructions (Signed)
ENDOCRINOLOGY CLEARANCE RECEIVED FROM DR. SOLUM

## 2019-11-02 DIAGNOSIS — F3132 Bipolar disorder, current episode depressed, moderate: Secondary | ICD-10-CM | POA: Diagnosis not present

## 2019-11-02 DIAGNOSIS — F411 Generalized anxiety disorder: Secondary | ICD-10-CM | POA: Diagnosis not present

## 2019-11-03 ENCOUNTER — Inpatient Hospital Stay: Payer: Medicare Other | Admitting: Certified Registered Nurse Anesthetist

## 2019-11-03 ENCOUNTER — Other Ambulatory Visit: Payer: Self-pay

## 2019-11-03 ENCOUNTER — Encounter: Payer: Self-pay | Admitting: Neurosurgery

## 2019-11-03 ENCOUNTER — Inpatient Hospital Stay
Admission: RE | Admit: 2019-11-03 | Discharge: 2019-11-08 | DRG: 458 | Disposition: A | Discharge status: Skilled Nursing Facility | Payer: Medicare Other | Attending: Neurosurgery | Admitting: Neurosurgery

## 2019-11-03 ENCOUNTER — Inpatient Hospital Stay: Payer: Medicare Other

## 2019-11-03 ENCOUNTER — Encounter
Admission: RE | Disposition: A | Discharge status: Skilled Nursing Facility | Payer: Self-pay | Source: Home / Self Care | Attending: Neurosurgery

## 2019-11-03 DIAGNOSIS — Z8571 Personal history of Hodgkin lymphoma: Secondary | ICD-10-CM

## 2019-11-03 DIAGNOSIS — M4856XA Collapsed vertebra, not elsewhere classified, lumbar region, initial encounter for fracture: Secondary | ICD-10-CM | POA: Diagnosis present

## 2019-11-03 DIAGNOSIS — Z20822 Contact with and (suspected) exposure to covid-19: Secondary | ICD-10-CM | POA: Diagnosis present

## 2019-11-03 DIAGNOSIS — M069 Rheumatoid arthritis, unspecified: Secondary | ICD-10-CM | POA: Diagnosis present

## 2019-11-03 DIAGNOSIS — F064 Anxiety disorder due to known physiological condition: Secondary | ICD-10-CM | POA: Diagnosis not present

## 2019-11-03 DIAGNOSIS — Z79899 Other long term (current) drug therapy: Secondary | ICD-10-CM | POA: Diagnosis not present

## 2019-11-03 DIAGNOSIS — E119 Type 2 diabetes mellitus without complications: Secondary | ICD-10-CM | POA: Diagnosis present

## 2019-11-03 DIAGNOSIS — M47816 Spondylosis without myelopathy or radiculopathy, lumbar region: Secondary | ICD-10-CM | POA: Diagnosis present

## 2019-11-03 DIAGNOSIS — Z981 Arthrodesis status: Secondary | ICD-10-CM

## 2019-11-03 DIAGNOSIS — I10 Essential (primary) hypertension: Secondary | ICD-10-CM | POA: Diagnosis present

## 2019-11-03 DIAGNOSIS — F445 Conversion disorder with seizures or convulsions: Secondary | ICD-10-CM | POA: Diagnosis not present

## 2019-11-03 DIAGNOSIS — N393 Stress incontinence (female) (male): Secondary | ICD-10-CM | POA: Diagnosis present

## 2019-11-03 DIAGNOSIS — N189 Chronic kidney disease, unspecified: Secondary | ICD-10-CM | POA: Diagnosis not present

## 2019-11-03 DIAGNOSIS — Z881 Allergy status to other antibiotic agents status: Secondary | ICD-10-CM | POA: Diagnosis not present

## 2019-11-03 DIAGNOSIS — Z7951 Long term (current) use of inhaled steroids: Secondary | ICD-10-CM | POA: Diagnosis not present

## 2019-11-03 DIAGNOSIS — K219 Gastro-esophageal reflux disease without esophagitis: Secondary | ICD-10-CM | POA: Diagnosis not present

## 2019-11-03 DIAGNOSIS — F418 Other specified anxiety disorders: Secondary | ICD-10-CM | POA: Diagnosis not present

## 2019-11-03 DIAGNOSIS — S32010G Wedge compression fracture of first lumbar vertebra, subsequent encounter for fracture with delayed healing: Secondary | ICD-10-CM | POA: Diagnosis not present

## 2019-11-03 DIAGNOSIS — Z7902 Long term (current) use of antithrombotics/antiplatelets: Secondary | ICD-10-CM | POA: Diagnosis not present

## 2019-11-03 DIAGNOSIS — J302 Other seasonal allergic rhinitis: Secondary | ICD-10-CM | POA: Diagnosis not present

## 2019-11-03 DIAGNOSIS — F319 Bipolar disorder, unspecified: Secondary | ICD-10-CM | POA: Diagnosis not present

## 2019-11-03 DIAGNOSIS — R109 Unspecified abdominal pain: Secondary | ICD-10-CM | POA: Diagnosis not present

## 2019-11-03 DIAGNOSIS — S32010A Wedge compression fracture of first lumbar vertebra, initial encounter for closed fracture: Secondary | ICD-10-CM | POA: Diagnosis not present

## 2019-11-03 DIAGNOSIS — E785 Hyperlipidemia, unspecified: Secondary | ICD-10-CM | POA: Diagnosis present

## 2019-11-03 DIAGNOSIS — Z888 Allergy status to other drugs, medicaments and biological substances status: Secondary | ICD-10-CM

## 2019-11-03 DIAGNOSIS — Z7983 Long term (current) use of bisphosphonates: Secondary | ICD-10-CM | POA: Diagnosis not present

## 2019-11-03 DIAGNOSIS — M5116 Intervertebral disc disorders with radiculopathy, lumbar region: Secondary | ICD-10-CM | POA: Diagnosis present

## 2019-11-03 DIAGNOSIS — N3289 Other specified disorders of bladder: Secondary | ICD-10-CM | POA: Diagnosis not present

## 2019-11-03 DIAGNOSIS — M419 Scoliosis, unspecified: Secondary | ICD-10-CM | POA: Diagnosis not present

## 2019-11-03 DIAGNOSIS — Z833 Family history of diabetes mellitus: Secondary | ICD-10-CM | POA: Diagnosis not present

## 2019-11-03 DIAGNOSIS — F329 Major depressive disorder, single episode, unspecified: Secondary | ICD-10-CM | POA: Diagnosis present

## 2019-11-03 DIAGNOSIS — M4156 Other secondary scoliosis, lumbar region: Secondary | ICD-10-CM | POA: Diagnosis present

## 2019-11-03 DIAGNOSIS — M4186 Other forms of scoliosis, lumbar region: Secondary | ICD-10-CM | POA: Diagnosis not present

## 2019-11-03 DIAGNOSIS — Z794 Long term (current) use of insulin: Secondary | ICD-10-CM | POA: Diagnosis not present

## 2019-11-03 DIAGNOSIS — R52 Pain, unspecified: Secondary | ICD-10-CM | POA: Diagnosis not present

## 2019-11-03 DIAGNOSIS — G4730 Sleep apnea, unspecified: Secondary | ICD-10-CM | POA: Diagnosis present

## 2019-11-03 DIAGNOSIS — E569 Vitamin deficiency, unspecified: Secondary | ICD-10-CM | POA: Diagnosis not present

## 2019-11-03 DIAGNOSIS — I129 Hypertensive chronic kidney disease with stage 1 through stage 4 chronic kidney disease, or unspecified chronic kidney disease: Secondary | ICD-10-CM | POA: Diagnosis not present

## 2019-11-03 DIAGNOSIS — M5136 Other intervertebral disc degeneration, lumbar region: Secondary | ICD-10-CM | POA: Diagnosis not present

## 2019-11-03 DIAGNOSIS — M6281 Muscle weakness (generalized): Secondary | ICD-10-CM | POA: Diagnosis not present

## 2019-11-03 DIAGNOSIS — Z419 Encounter for procedure for purposes other than remedying health state, unspecified: Secondary | ICD-10-CM

## 2019-11-03 DIAGNOSIS — F3289 Other specified depressive episodes: Secondary | ICD-10-CM | POA: Diagnosis not present

## 2019-11-03 DIAGNOSIS — Z7401 Bed confinement status: Secondary | ICD-10-CM | POA: Diagnosis not present

## 2019-11-03 DIAGNOSIS — M2550 Pain in unspecified joint: Secondary | ICD-10-CM | POA: Diagnosis not present

## 2019-11-03 DIAGNOSIS — Z8673 Personal history of transient ischemic attack (TIA), and cerebral infarction without residual deficits: Secondary | ICD-10-CM

## 2019-11-03 DIAGNOSIS — E1165 Type 2 diabetes mellitus with hyperglycemia: Secondary | ICD-10-CM | POA: Diagnosis not present

## 2019-11-03 DIAGNOSIS — E1122 Type 2 diabetes mellitus with diabetic chronic kidney disease: Secondary | ICD-10-CM | POA: Diagnosis not present

## 2019-11-03 DIAGNOSIS — M048 Other autoinflammatory syndromes: Secondary | ICD-10-CM | POA: Diagnosis not present

## 2019-11-03 DIAGNOSIS — M5416 Radiculopathy, lumbar region: Secondary | ICD-10-CM | POA: Diagnosis not present

## 2019-11-03 DIAGNOSIS — M4184 Other forms of scoliosis, thoracic region: Secondary | ICD-10-CM | POA: Diagnosis not present

## 2019-11-03 DIAGNOSIS — M4805 Spinal stenosis, thoracolumbar region: Secondary | ICD-10-CM | POA: Diagnosis not present

## 2019-11-03 HISTORY — PX: LAMINECTOMY WITH POSTERIOR LATERAL ARTHRODESIS LEVEL 4: SHX6338

## 2019-11-03 LAB — ABO/RH: ABO/RH(D): A POS

## 2019-11-03 LAB — GLUCOSE, CAPILLARY
Glucose-Capillary: 216 mg/dL — ABNORMAL HIGH (ref 70–99)
Glucose-Capillary: 210 mg/dL — ABNORMAL HIGH (ref 70–99)

## 2019-11-03 SURGERY — LAMINECTOMY WITH POSTERIOR LATERAL ARTHRODESIS LEVEL 4
Anesthesia: General

## 2019-11-03 MED ORDER — LORAZEPAM 1 MG PO TABS
1.0000 mg | ORAL_TABLET | Freq: Every day | ORAL | Status: DC | PRN
  Administered 2019-11-07 – 2019-11-08 (×2): 1 mg via ORAL
  Filled 2019-11-03 (×2): qty 1

## 2019-11-03 MED ORDER — MONTELUKAST SODIUM 10 MG PO TABS
10.0000 mg | ORAL_TABLET | Freq: Every day | ORAL | Status: DC
  Administered 2019-11-04 – 2019-11-08 (×5): 10 mg via ORAL
  Filled 2019-11-03 (×5): qty 1

## 2019-11-03 MED ORDER — SODIUM CHLORIDE 0.9 % IV SOLN
INTRAVENOUS | Status: DC | PRN
  Administered 2019-11-03: 40 mL

## 2019-11-03 MED ORDER — ALBUTEROL SULFATE (2.5 MG/3ML) 0.083% IN NEBU: 2.5000 mg | INHALATION_SOLUTION | Freq: Two times a day (BID) | RESPIRATORY_TRACT | Status: DC | PRN

## 2019-11-03 MED ORDER — PRIMIDONE 50 MG PO TABS
200.0000 mg | ORAL_TABLET | Freq: Every day | ORAL | Status: DC
  Administered 2019-11-04 – 2019-11-08 (×5): 200 mg via ORAL
  Filled 2019-11-03 (×5): qty 4

## 2019-11-03 MED ORDER — DEXMEDETOMIDINE HCL 200 MCG/2ML IV SOLN
INTRAVENOUS | Status: DC | PRN
  Administered 2019-11-03: 12 ug via INTRAVENOUS
  Administered 2019-11-03: 8 ug via INTRAVENOUS

## 2019-11-03 MED ORDER — CARBAMAZEPINE 200 MG PO TABS
200.0000 mg | ORAL_TABLET | Freq: Three times a day (TID) | ORAL | Status: DC
  Administered 2019-11-04 – 2019-11-08 (×15): 200 mg via ORAL
  Filled 2019-11-03 (×17): qty 1

## 2019-11-03 MED ORDER — VANCOMYCIN HCL 1000 MG IV SOLR
INTRAVENOUS | Status: AC
  Filled 2019-11-03: qty 1000

## 2019-11-03 MED ORDER — POLYETHYLENE GLYCOL 3350 17 G PO PACK: 17.0000 g | PACK | Freq: Every day | ORAL | Status: DC | PRN

## 2019-11-03 MED ORDER — ONDANSETRON HCL 4 MG/2ML IJ SOLN
INTRAMUSCULAR | Status: AC
  Filled 2019-11-03: qty 2

## 2019-11-03 MED ORDER — OMEGA-3-ACID ETHYL ESTERS 1 G PO CAPS
2.0000 g | ORAL_CAPSULE | Freq: Two times a day (BID) | ORAL | Status: DC
  Administered 2019-11-04 – 2019-11-08 (×10): 2 g via ORAL
  Filled 2019-11-03 (×10): qty 2

## 2019-11-03 MED ORDER — FENTANYL CITRATE (PF) 100 MCG/2ML IJ SOLN
25.0000 ug | INTRAMUSCULAR | Status: DC | PRN
  Administered 2019-11-03: 25 ug via INTRAVENOUS

## 2019-11-03 MED ORDER — FENTANYL CITRATE (PF) 250 MCG/5ML IJ SOLN
INTRAMUSCULAR | Status: AC
  Filled 2019-11-03: qty 5

## 2019-11-03 MED ORDER — VANCOMYCIN HCL 1000 MG IV SOLR
INTRAVENOUS | Status: DC | PRN
  Administered 2019-11-03: 1000 mg

## 2019-11-03 MED ORDER — ONDANSETRON HCL 4 MG/2ML IJ SOLN: INTRAMUSCULAR | Status: DC | PRN

## 2019-11-03 MED ORDER — PHENOL 1.4 % MT LIQD
1.0000 | OROMUCOSAL | Status: DC | PRN
  Filled 2019-11-03: qty 177

## 2019-11-03 MED ORDER — SODIUM CHLORIDE 0.9% FLUSH: 3.0000 mL | INTRAVENOUS | Status: DC | PRN

## 2019-11-03 MED ORDER — CLONIDINE HCL 0.1 MG PO TABS: 0.1000 mg | ORAL_TABLET | Freq: Every day | ORAL | Status: DC | PRN

## 2019-11-03 MED ORDER — ROSUVASTATIN CALCIUM 10 MG PO TABS
20.0000 mg | ORAL_TABLET | Freq: Every day | ORAL | Status: DC
  Administered 2019-11-04 – 2019-11-08 (×5): 20 mg via ORAL
  Filled 2019-11-03 (×5): qty 2

## 2019-11-03 MED ORDER — SUGAMMADEX SODIUM 200 MG/2ML IV SOLN
INTRAVENOUS | Status: DC | PRN
  Administered 2019-11-03: 200 mg via INTRAVENOUS

## 2019-11-03 MED ORDER — HYDROMORPHONE HCL 1 MG/ML IJ SOLN: 0.5000 mg | INTRAMUSCULAR | Status: DC | PRN

## 2019-11-03 MED ORDER — OXYCODONE HCL 5 MG PO TABS: 5.0000 mg | ORAL_TABLET | ORAL | Status: DC | PRN

## 2019-11-03 MED ORDER — BUPIVACAINE HCL (PF) 0.5 % IJ SOLN
INTRAMUSCULAR | Status: DC | PRN
  Administered 2019-11-03: 20 mL

## 2019-11-03 MED ORDER — PROPOFOL 10 MG/ML IV BOLUS
INTRAVENOUS | Status: AC
  Filled 2019-11-03: qty 20

## 2019-11-03 MED ORDER — INSULIN ASPART 100 UNIT/ML ~~LOC~~ SOLN
22.0000 [IU] | Freq: Three times a day (TID) | SUBCUTANEOUS | Status: DC
  Administered 2019-11-04 – 2019-11-08 (×11): 22 [IU] via SUBCUTANEOUS
  Filled 2019-11-03 (×8): qty 1

## 2019-11-03 MED ORDER — SUCCINYLCHOLINE CHLORIDE 20 MG/ML IJ SOLN
INTRAMUSCULAR | Status: DC | PRN
  Administered 2019-11-03: 100 mg via INTRAVENOUS

## 2019-11-03 MED ORDER — MECLIZINE HCL 25 MG PO TABS
25.0000 mg | ORAL_TABLET | Freq: Every evening | ORAL | Status: DC
  Administered 2019-11-04 – 2019-11-07 (×3): 25 mg via ORAL
  Filled 2019-11-03 (×5): qty 1

## 2019-11-03 MED ORDER — SODIUM CHLORIDE 0.9 % IV SOLN: 250.0000 mL | INTRAVENOUS | Status: DC

## 2019-11-03 MED ORDER — LIDOCAINE HCL (PF) 2 % IJ SOLN
INTRAMUSCULAR | Status: AC
  Filled 2019-11-03: qty 10

## 2019-11-03 MED ORDER — TRIMETHOPRIM 100 MG PO TABS
100.0000 mg | ORAL_TABLET | Freq: Every day | ORAL | Status: DC
  Administered 2019-11-04 – 2019-11-08 (×5): 100 mg via ORAL
  Filled 2019-11-03 (×5): qty 1

## 2019-11-03 MED ORDER — ONDANSETRON HCL 4 MG PO TABS: 4.0000 mg | ORAL_TABLET | Freq: Four times a day (QID) | ORAL | Status: DC | PRN

## 2019-11-03 MED ORDER — INSULIN GLARGINE 100 UNIT/ML ~~LOC~~ SOLN
85.0000 [IU] | Freq: Every day | SUBCUTANEOUS | Status: DC
  Administered 2019-11-04 – 2019-11-08 (×6): 85 [IU] via SUBCUTANEOUS
  Filled 2019-11-03 (×6): qty 0.85

## 2019-11-03 MED ORDER — LORATADINE 10 MG PO TABS
10.0000 mg | ORAL_TABLET | Freq: Every day | ORAL | Status: DC
  Administered 2019-11-04 – 2019-11-08 (×5): 10 mg via ORAL
  Filled 2019-11-03 (×5): qty 1

## 2019-11-03 MED ORDER — MIRABEGRON ER 50 MG PO TB24
100.0000 mg | ORAL_TABLET | Freq: Every day | ORAL | Status: DC
  Administered 2019-11-04 – 2019-11-08 (×6): 100 mg via ORAL
  Filled 2019-11-03 (×6): qty 2

## 2019-11-03 MED ORDER — DULOXETINE HCL 60 MG PO CPEP
60.0000 mg | ORAL_CAPSULE | Freq: Every day | ORAL | Status: DC
  Administered 2019-11-04 – 2019-11-08 (×5): 60 mg via ORAL
  Filled 2019-11-03 (×5): qty 1

## 2019-11-03 MED ORDER — ACETAMINOPHEN 500 MG PO TABS
1000.0000 mg | ORAL_TABLET | Freq: Four times a day (QID) | ORAL | Status: AC
  Administered 2019-11-04 (×4): 1000 mg via ORAL
  Filled 2019-11-03 (×4): qty 2

## 2019-11-03 MED ORDER — THROMBIN 5000 UNITS EX SOLR
CUTANEOUS | Status: DC | PRN
  Administered 2019-11-03: 5000 [IU] via TOPICAL

## 2019-11-03 MED ORDER — HYDRALAZINE HCL 50 MG PO TABS
50.0000 mg | ORAL_TABLET | Freq: Three times a day (TID) | ORAL | Status: DC
  Administered 2019-11-04 – 2019-11-08 (×13): 50 mg via ORAL
  Filled 2019-11-03 (×13): qty 1

## 2019-11-03 MED ORDER — GABAPENTIN 300 MG PO CAPS
300.0000 mg | ORAL_CAPSULE | Freq: Every day | ORAL | Status: DC
  Administered 2019-11-04 – 2019-11-08 (×5): 300 mg via ORAL
  Filled 2019-11-03 (×3): qty 1
  Filled 2019-11-03: qty 3
  Filled 2019-11-03 (×3): qty 1

## 2019-11-03 MED ORDER — PROPOFOL 10 MG/ML IV BOLUS
INTRAVENOUS | Status: DC | PRN
  Administered 2019-11-03: 50 mg via INTRAVENOUS
  Administered 2019-11-03: 120 mg via INTRAVENOUS

## 2019-11-03 MED ORDER — DEXAMETHASONE SODIUM PHOSPHATE 10 MG/ML IJ SOLN
INTRAMUSCULAR | Status: DC | PRN
  Administered 2019-11-03: 5 mg via INTRAVENOUS

## 2019-11-03 MED ORDER — ACETAMINOPHEN 650 MG RE SUPP: 650.0000 mg | RECTAL | Status: DC | PRN

## 2019-11-03 MED ORDER — FENTANYL CITRATE (PF) 100 MCG/2ML IJ SOLN
INTRAMUSCULAR | Status: AC
  Administered 2019-11-03: 25 ug via INTRAVENOUS
  Filled 2019-11-03: qty 2

## 2019-11-03 MED ORDER — VANCOMYCIN HCL 1250 MG/250ML IV SOLN
1250.0000 mg | Freq: Once | INTRAVENOUS | Status: AC
  Administered 2019-11-03: 1250 mg via INTRAVENOUS
  Filled 2019-11-03: qty 250

## 2019-11-03 MED ORDER — FENTANYL CITRATE (PF) 100 MCG/2ML IJ SOLN
INTRAMUSCULAR | Status: DC | PRN
  Administered 2019-11-03: 100 ug via INTRAVENOUS
  Administered 2019-11-03 (×3): 50 ug via INTRAVENOUS

## 2019-11-03 MED ORDER — OXYCODONE HCL 5 MG PO TABS
10.0000 mg | ORAL_TABLET | ORAL | Status: DC | PRN
  Administered 2019-11-04 – 2019-11-05 (×3): 10 mg via ORAL
  Filled 2019-11-03 (×3): qty 2

## 2019-11-03 MED ORDER — SODIUM CHLORIDE 0.9% FLUSH
3.0000 mL | Freq: Two times a day (BID) | INTRAVENOUS | Status: DC
  Administered 2019-11-04 – 2019-11-08 (×5): 3 mL via INTRAVENOUS

## 2019-11-03 MED ORDER — CEFAZOLIN SODIUM-DEXTROSE 1-4 GM/50ML-% IV SOLN
1.0000 g | Freq: Once | INTRAVENOUS | Status: AC
  Administered 2019-11-03: 1 g via INTRAVENOUS

## 2019-11-03 MED ORDER — MAGNESIUM CITRATE PO SOLN
1.0000 | Freq: Once | ORAL | Status: AC | PRN
  Administered 2019-11-06: 02:00:00 1 via ORAL
  Filled 2019-11-03 (×2): qty 296

## 2019-11-03 MED ORDER — MIDAZOLAM HCL 2 MG/2ML IJ SOLN
INTRAMUSCULAR | Status: DC | PRN
  Administered 2019-11-03: 2 mg via INTRAVENOUS

## 2019-11-03 MED ORDER — SENNA 8.6 MG PO TABS
1.0000 | ORAL_TABLET | Freq: Two times a day (BID) | ORAL | Status: DC
  Administered 2019-11-04 – 2019-11-07 (×9): 8.6 mg via ORAL
  Filled 2019-11-03 (×10): qty 1

## 2019-11-03 MED ORDER — SODIUM CHLORIDE 0.9 % IV SOLN: INTRAVENOUS | Status: DC

## 2019-11-03 MED ORDER — MECLIZINE HCL 25 MG PO TABS
50.0000 mg | ORAL_TABLET | Freq: Every morning | ORAL | Status: DC
  Administered 2019-11-04 – 2019-11-08 (×5): 50 mg via ORAL
  Filled 2019-11-03 (×5): qty 2

## 2019-11-03 MED ORDER — PANTOPRAZOLE SODIUM 40 MG PO TBEC
40.0000 mg | DELAYED_RELEASE_TABLET | Freq: Every day | ORAL | Status: DC
  Administered 2019-11-04 – 2019-11-08 (×5): 40 mg via ORAL
  Filled 2019-11-03 (×5): qty 1

## 2019-11-03 MED ORDER — DEXAMETHASONE SODIUM PHOSPHATE 10 MG/ML IJ SOLN
INTRAMUSCULAR | Status: AC
  Filled 2019-11-03: qty 1

## 2019-11-03 MED ORDER — ACETAMINOPHEN 325 MG PO TABS
650.0000 mg | ORAL_TABLET | ORAL | Status: DC | PRN
  Administered 2019-11-08: 650 mg via ORAL
  Filled 2019-11-03: qty 2

## 2019-11-03 MED ORDER — KETAMINE HCL 50 MG/ML IJ SOLN
INTRAMUSCULAR | Status: AC
  Filled 2019-11-03: qty 10

## 2019-11-03 MED ORDER — BISACODYL 5 MG PO TBEC
5.0000 mg | DELAYED_RELEASE_TABLET | Freq: Every day | ORAL | Status: DC | PRN
  Administered 2019-11-05: 5 mg via ORAL
  Filled 2019-11-03: qty 1

## 2019-11-03 MED ORDER — NIFEDIPINE ER 60 MG PO TB24
60.0000 mg | ORAL_TABLET | Freq: Two times a day (BID) | ORAL | Status: DC
  Administered 2019-11-04 – 2019-11-08 (×9): 60 mg via ORAL
  Filled 2019-11-03 (×11): qty 1

## 2019-11-03 MED ORDER — MENTHOL 3 MG MT LOZG
1.0000 | LOZENGE | OROMUCOSAL | Status: DC | PRN
  Filled 2019-11-03 (×2): qty 9

## 2019-11-03 MED ORDER — BUPIVACAINE-EPINEPHRINE 0.5% -1:200000 IJ SOLN
INTRAMUSCULAR | Status: DC | PRN
  Administered 2019-11-03: 10 mL

## 2019-11-03 MED ORDER — PROPRANOLOL HCL 20 MG PO TABS
10.0000 mg | ORAL_TABLET | Freq: Every day | ORAL | Status: DC
  Administered 2019-11-04 – 2019-11-08 (×6): 10 mg via ORAL
  Filled 2019-11-03: qty 1
  Filled 2019-11-03: qty 0.5
  Filled 2019-11-03 (×5): qty 1

## 2019-11-03 MED ORDER — ALENDRONATE SODIUM 70 MG PO TABS: 70.0000 mg | ORAL_TABLET | ORAL | Status: DC

## 2019-11-03 MED ORDER — KETAMINE HCL 50 MG/ML IJ SOLN
INTRAMUSCULAR | Status: DC | PRN
  Administered 2019-11-03: 25 mg via INTRAMUSCULAR

## 2019-11-03 MED ORDER — EMPAGLIFLOZIN 10 MG PO TABS: 10.0000 mg | ORAL_TABLET | Freq: Every day | ORAL | Status: DC

## 2019-11-03 MED ORDER — METHOCARBAMOL 1000 MG/10ML IJ SOLN
500.0000 mg | Freq: Four times a day (QID) | INTRAVENOUS | Status: DC
  Administered 2019-11-03: 500 mg via INTRAVENOUS
  Filled 2019-11-03 (×12): qty 5

## 2019-11-03 MED ORDER — CEFAZOLIN SODIUM-DEXTROSE 1-4 GM/50ML-% IV SOLN
INTRAVENOUS | Status: AC
  Filled 2019-11-03: qty 50

## 2019-11-03 MED ORDER — MOMETASONE FURO-FORMOTEROL FUM 200-5 MCG/ACT IN AERO
2.0000 | INHALATION_SPRAY | Freq: Two times a day (BID) | RESPIRATORY_TRACT | Status: DC
  Administered 2019-11-04 – 2019-11-07 (×6): 2 via RESPIRATORY_TRACT
  Filled 2019-11-03: qty 8.8

## 2019-11-03 MED ORDER — SODIUM CHLORIDE 0.9 % IR SOLN
Status: DC | PRN
  Administered 2019-11-03: 17:00:00 1000 mL

## 2019-11-03 MED ORDER — MIDAZOLAM HCL 2 MG/2ML IJ SOLN
INTRAMUSCULAR | Status: AC
  Filled 2019-11-03: qty 2

## 2019-11-03 MED ORDER — INSULIN ASPART 100 UNIT/ML ~~LOC~~ SOLN
0.0000 [IU] | Freq: Three times a day (TID) | SUBCUTANEOUS | Status: DC
  Administered 2019-11-04: 3 [IU] via SUBCUTANEOUS
  Administered 2019-11-04: 5 [IU] via SUBCUTANEOUS
  Administered 2019-11-04: 2 [IU] via SUBCUTANEOUS
  Administered 2019-11-05 – 2019-11-06 (×3): 3 [IU] via SUBCUTANEOUS
  Administered 2019-11-07: 2 [IU] via SUBCUTANEOUS
  Administered 2019-11-07: 8 [IU] via SUBCUTANEOUS
  Filled 2019-11-03 (×6): qty 1

## 2019-11-03 MED ORDER — ONDANSETRON HCL 4 MG/2ML IJ SOLN
4.0000 mg | Freq: Four times a day (QID) | INTRAMUSCULAR | Status: DC | PRN
  Filled 2019-11-03: qty 2

## 2019-11-03 MED ORDER — METHOCARBAMOL 500 MG PO TABS
750.0000 mg | ORAL_TABLET | Freq: Four times a day (QID) | ORAL | Status: DC
  Administered 2019-11-04 – 2019-11-06 (×9): 750 mg via ORAL
  Filled 2019-11-03 (×9): qty 2

## 2019-11-03 MED ORDER — LIDOCAINE HCL (CARDIAC) PF 100 MG/5ML IV SOSY
PREFILLED_SYRINGE | INTRAVENOUS | Status: DC | PRN
  Administered 2019-11-03: 60 mg via INTRAVENOUS
  Administered 2019-11-03: 40 mg via INTRAVENOUS

## 2019-11-03 MED ORDER — PHENYLEPHRINE HCL (PRESSORS) 10 MG/ML IV SOLN
INTRAVENOUS | Status: DC | PRN
  Administered 2019-11-03 (×6): 100 ug via INTRAVENOUS

## 2019-11-03 MED ORDER — ROCURONIUM BROMIDE 100 MG/10ML IV SOLN
INTRAVENOUS | Status: DC | PRN
  Administered 2019-11-03: 30 mg via INTRAVENOUS
  Administered 2019-11-03: 20 mg via INTRAVENOUS
  Administered 2019-11-03: 10 mg via INTRAVENOUS
  Administered 2019-11-03: 20 mg via INTRAVENOUS

## 2019-11-03 MED ORDER — ROCURONIUM BROMIDE 10 MG/ML (PF) SYRINGE
PREFILLED_SYRINGE | INTRAVENOUS | Status: AC
  Filled 2019-11-03: qty 10

## 2019-11-03 MED ORDER — LISINOPRIL 5 MG PO TABS
5.0000 mg | ORAL_TABLET | Freq: Every day | ORAL | Status: DC
  Administered 2019-11-04 – 2019-11-08 (×5): 5 mg via ORAL
  Filled 2019-11-03 (×5): qty 1

## 2019-11-03 SURGICAL SUPPLY — 84 items
BONE CANC CHIPS 20CC PCAN1/4 (Bone Implant) ×4 IMPLANT
BONE CANC CHIPS 40CC CAN1/2 (Bone Implant) ×2 IMPLANT
BUR NEURO DRILL SOFT 3.0X3.8M (BURR) ×2 IMPLANT
CANISTER SUCT 1200ML W/VALVE (MISCELLANEOUS) ×4 IMPLANT
CHIPS CANC BONE 20CC PCAN1/4 (Bone Implant) ×2 IMPLANT
CHIPS CANC BONE 40CC CAN1/2 (Bone Implant) ×1 IMPLANT
CHLORAPREP W/TINT 26 (MISCELLANEOUS) ×8 IMPLANT
CORD BIP STRL DISP 12FT (MISCELLANEOUS) ×2 IMPLANT
COUNTER NEEDLE 20/40 LG (NEEDLE) ×2 IMPLANT
COVER BACK TABLE REUSABLE LG (DRAPES) ×2 IMPLANT
COVER LIGHT HANDLE STERIS (MISCELLANEOUS) ×8 IMPLANT
COVER WAND RF STERILE (DRAPES) ×2 IMPLANT
CRADLE LAMINECT ARM (MISCELLANEOUS) ×4 IMPLANT
CUP MEDICINE 2OZ PLAST GRAD ST (MISCELLANEOUS) ×2 IMPLANT
DERMABOND ADVANCED (GAUZE/BANDAGES/DRESSINGS) ×2
DERMABOND ADVANCED .7 DNX12 (GAUZE/BANDAGES/DRESSINGS) ×2 IMPLANT
DRAIN JP 10F RND SILICONE (MISCELLANEOUS) ×1 IMPLANT
DRAPE C-ARM 42X72 X-RAY (DRAPES) ×4 IMPLANT
DRAPE C-ARMOR (DRAPES) ×4 IMPLANT
DRAPE INCISE IOBAN 66X45 STRL (DRAPES) ×3 IMPLANT
DRAPE LAPAROTOMY 100X77 ABD (DRAPES) ×4 IMPLANT
DRAPE MICROSCOPE SPINE 48X150 (DRAPES) ×1 IMPLANT
DRAPE POUCH INSTRU U-SHP 10X18 (DRAPES) ×1 IMPLANT
DRAPE SURG 17X11 SM STRL (DRAPES) ×16 IMPLANT
DRSG OPSITE POSTOP 4X6 (GAUZE/BANDAGES/DRESSINGS) IMPLANT
DRSG TEGADERM 2-3/8X2-3/4 SM (GAUZE/BANDAGES/DRESSINGS) IMPLANT
DRSG TEGADERM 4X4.75 (GAUZE/BANDAGES/DRESSINGS) IMPLANT
DRSG TEGADERM 6X8 (GAUZE/BANDAGES/DRESSINGS) IMPLANT
DRSG TELFA 3X8 NADH (GAUZE/BANDAGES/DRESSINGS) IMPLANT
DRSG TELFA 4X3 1S NADH ST (GAUZE/BANDAGES/DRESSINGS) IMPLANT
ELECT CAUTERY BLADE TIP 2.5 (TIP) ×4
ELECT EZSTD 165MM 6.5IN (MISCELLANEOUS) ×2
ELECT REM PT RETURN 9FT ADLT (ELECTROSURGICAL) ×4
ELECTRODE CAUTERY BLDE TIP 2.5 (TIP) ×2 IMPLANT
ELECTRODE EZSTD 165MM 6.5IN (MISCELLANEOUS) ×1 IMPLANT
ELECTRODE REM PT RTRN 9FT ADLT (ELECTROSURGICAL) ×2 IMPLANT
FEE INTRAOP MONITOR IMPULS NCS (MISCELLANEOUS) IMPLANT
FRAME EYE SHIELD (PROTECTIVE WEAR) ×4 IMPLANT
GLOVE BIOGEL PI IND STRL 7.0 (GLOVE) ×2 IMPLANT
GLOVE BIOGEL PI INDICATOR 7.0 (GLOVE) ×2
GLOVE SURG SYN 7.0 (GLOVE) ×8 IMPLANT
GLOVE SURG SYN 7.0 PF PI (GLOVE) ×4 IMPLANT
GLOVE SURG SYN 8.5  E (GLOVE) ×12
GLOVE SURG SYN 8.5 E (GLOVE) ×6 IMPLANT
GLOVE SURG SYN 8.5 PF PI (GLOVE) ×6 IMPLANT
GOWN SRG XL LVL 3 NONREINFORCE (GOWNS) ×2 IMPLANT
GOWN STRL NON-REIN TWL XL LVL3 (GOWNS) ×4
GOWN STRL REUS W/TWL MED LVL3 (GOWN DISPOSABLE) ×4 IMPLANT
GRADUATE 1200CC STRL 31836 (MISCELLANEOUS) ×2 IMPLANT
GRAFT BNE CANC CHIPS 1-8 20CC (Bone Implant) IMPLANT
GRAFT BNE CHIP CANC 1-8 40 (Bone Implant) IMPLANT
HEMOVAC 400CC 10FR (MISCELLANEOUS) ×1 IMPLANT
INTRAOP MONITOR FEE IMPULS NCS (MISCELLANEOUS)
INTRAOP MONITOR FEE IMPULSE (MISCELLANEOUS)
KIT ASP BONE MRW 120CC (KITS) ×1 IMPLANT
KIT SPINAL PRONEVIEW (KITS) ×2 IMPLANT
KIT TURNOVER KIT A (KITS) ×2 IMPLANT
KNIFE BAYONET SHORT DISCETOMY (MISCELLANEOUS) IMPLANT
MARKER SKIN DUAL TIP RULER LAB (MISCELLANEOUS) ×6 IMPLANT
NDL SAFETY ECLIPSE 18X1.5 (NEEDLE) ×1 IMPLANT
NEEDLE HYPO 18GX1.5 SHARP (NEEDLE) ×2
NEEDLE HYPO 22GX1.5 SAFETY (NEEDLE) ×2 IMPLANT
PACK LAMINECTOMY NEURO (CUSTOM PROCEDURE TRAY) ×2 IMPLANT
PAD ARMBOARD 7.5X6 YLW CONV (MISCELLANEOUS) ×2 IMPLANT
PAD DRESSING TELFA 3X8 NADH (GAUZE/BANDAGES/DRESSINGS) IMPLANT
PENCIL ELECTRO HAND CTR (MISCELLANEOUS) ×2 IMPLANT
PUTTY DBX 5CC (Putty) ×4 IMPLANT
ROD SPINAL EXP LVL1 5.5X480 (Rod) ×1 IMPLANT
SCREW EXPEDIUM POLYAXIAL 6X45M (Screw) ×10 IMPLANT
SCREW SET SINGLE INNER (Screw) ×10 IMPLANT
SPOGE SURGIFLO 8M (HEMOSTASIS) ×2
SPONGE GAUZE 2X2 8PLY STRL LF (GAUZE/BANDAGES/DRESSINGS) IMPLANT
SPONGE SURGIFLO 8M (HEMOSTASIS) ×1 IMPLANT
STAPLER SKIN PROX 35W (STAPLE) ×2 IMPLANT
SUT DVC VLOC 3-0 CL 6 P-12 (SUTURE) ×2 IMPLANT
SUT ETHILON 3-0 FS-10 30 BLK (SUTURE)
SUT VIC AB 0 CT1 27 (SUTURE) ×4
SUT VIC AB 0 CT1 27XCR 8 STRN (SUTURE) ×1 IMPLANT
SUT VIC AB 2-0 CT1 18 (SUTURE) ×5 IMPLANT
SUTURE EHLN 3-0 FS-10 30 BLK (SUTURE) IMPLANT
SYR 30ML LL (SYRINGE) ×4 IMPLANT
TOWEL OR 17X26 4PK STRL BLUE (TOWEL DISPOSABLE) ×6 IMPLANT
TRAY FOLEY MTR SLVR 16FR STAT (SET/KITS/TRAYS/PACK) ×1 IMPLANT
TUBING CONNECTING 10 (TUBING) ×6 IMPLANT

## 2019-11-03 NOTE — [place] Notes (Signed)

## 2019-11-03 NOTE — Anesthesia Postprocedure Evaluation (Signed)
Anesthesia Post Note  Patient: [name]  Procedure(s) Performed: T11-L3 POSTERIOR FUSION, T12-L2 POSTERIOR COLUMN OSTEOTOMIES (N/A ) BONE MARROW ASPIRATION FOR SPINE FUSION ONLY (BMAC) (N/A )  Patient location during evaluation: PACU Anesthesia Type: General Level of consciousness: awake and alert Pain management: pain level controlled Vital Signs Assessment: post-procedure vital signs reviewed and stable Respiratory status: spontaneous breathing, nonlabored ventilation, respiratory function stable and patient connected to nasal cannula oxygen Cardiovascular status: blood pressure returned to baseline and stable Postop Assessment: no apparent nausea or vomiting Anesthetic complications: no     Last Vitals:  Vitals:   11/03/19 2055 11/03/19 2110  BP: 139/85 (!) 143/88  Pulse: 91 90  Resp: 14 14  Temp:  36.7 C  SpO2: 97% 96%    Last Pain:  Vitals:   11/03/19 2110  TempSrc:   PainSc: 0-No pain                 Martha Clan

## 2019-11-03 NOTE — Op Note (Signed)
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 Due to worsening kyphosis of the L1 fracture, surgery was recommended.  Findings: reduction of fracture  Preoperative Diagnosis: see above Postoperative Diagnosis: same   EBL: 400 ml IVF: 1300 ml Drains: 2 placed Disposition: Extubated and Stable to PACU Complications: none  A foley catheter was placed.   Preoperative Note:   Risks of surgery discussed include: infection, bleeding, stroke, coma, death, paralysis, CSF leak, nerve/spinal cord injury, numbness, tingling, weakness, complex regional pain syndrome, recurrent stenosis and/or disc herniation, vascular injury, development of instability, neck/back pain, need for further surgery, persistent symptoms, development of deformity, and the risks of anesthesia. The patient understood these risks and agreed to proceed.  Operative Note:  1. Posterior column osteotomies at T12/L1 and L1/2 2. Posterolateral arthrodesis T11 to L3 using Depuy Expedium 3. Posterior segmental instrumentation T11 to L3 4. Harvesting of autograft via the same incision    The patient was brought to the Operating Room, intubated and turned into the prone position. All pressure points were checked and double checked. Flouroscopy was used to mark the incision. The patient was prepped and draped in the standard fashion. A full timeout was performed. Preoperative antibiotics were given. The incision was injected with local anesthetic.  The incision was opened with a scalpel, then the soft tissues divided with the Bovie. Self-retaining retractors were placed. The paraspinus muscles were reflected laterally in subperiosteal fashion until the transverse processes were visible. Flouroscopy was used to confirm our localization.   Via a separate incision, a tuohy needle was introduced into the left iliac crest.  120 ml of bone marrow was  aspirated and handed out for preparation for arthrodesis.   The self-retaining retractors were repositioned. We then placed pedicle screws.   At T11 on one side, a starting point was chosen based on anatomic landmarks, then breached with a high speed drill. A pedicle finder probe was used to cannulate the pedicle, then the balltip probe used to confirm lack of breach. The tract was tapped, re-checked with the balltip probe, then a 6 x 45 mm pedicle screw was placed. The procedure was then repeated contralaterally and the same size screw placed.  At T12 on one side, a starting point was chosen based on anatomic landmarks, then breached with a high speed drill. A pedicle finder probe was used to cannulate the pedicle, then the balltip probe used to confirm lack of breach. The tract was tapped, re-checked with the balltip probe, then a 6 x 45 mm pedicle screw was placed. The procedure was then repeated contralaterally and the same size screw placed.  At L1 on one side, a starting point was chosen based on anatomic landmarks, then breached with a high speed drill. A pedicle finder probe was used to cannulate the pedicle, then the balltip probe used to confirm lack of breach. The tract was tapped, re-checked with the balltip probe, then a 6 x 45 mm pedicle screw was placed. The procedure was then repeated contralaterally and the same size screw placed.  At L2 on one side, a starting point was chosen based on anatomic landmarks, then breached with a high speed drill. A pedicle finder probe was used to cannulate the pedicle, then the balltip probe used to confirm lack of breach. The tract was tapped, re-checked with the balltip probe, then a 6 x 45 mm pedicle screw was placed. The procedure was  then repeated contralaterally and the same size screw placed.  At L3 on one side, a starting point was chosen based on anatomic landmarks, then breached with a high speed drill. A pedicle finder probe was used to  cannulate the pedicle, then the balltip probe used to confirm lack of breach. The tract was tapped, re-checked with the balltip probe, then a 6 x 45 mm pedicle screw was placed. The procedure was then repeated contralaterally and the same size screw placed.   At this point, we moved to posterior column osteotomies at T12/L1 and L1/2.  The inferior articulating processes of T12 and L1 were removed bilaterally.  The leading edge of L1 and L2 were removed.  The ligamentum flavum was removed at T12/L1 and L1/2.  The superior articulating processes of L1 and L2 were removed bilaterally.  At this point, wedge shaped closing single column osteotomies were performed at both T12/L1 and L1/2.   Rods were measured to length, cut, and shaped. The rods were secured using locking caps to manufacturer's specifications. The osteotomies were compressed.   Final AP and lateral radiographs were taken to confirm placement of instrumentation and appropriate alignment.  Final alignment was secured with sagittal and coronal plane benders.   The wound was copiously irrigated, then the external surfaces of the remaining lamina, facet, and transverse processes from T11 to L3 were decorticated. A mixture of allograft and autograft was placed over the decorticated surfaces for arthrodesis.  A drain was placed subfascially.   After hemostasis, the wound was closed in layers with 0 and 2-0 vicryl. Staples were used on the skin.  The patient was then flipped supine and extubated with incident. All counts were correct times 2 at the end of the case. No immediate complications were noted.   Marin Olp PA acted as Environmental consultant throughout the case.  Meade Maw MD

## 2019-11-03 NOTE — Transfer of Care (Signed)
Immediate Anesthesia Transfer of Care Note  Patient: Victoria Austin  Procedure(s) Performed: T11-L3 POSTERIOR FUSION, T12-L2 POSTERIOR COLUMN OSTEOTOMIES (N/A ) BONE MARROW ASPIRATION FOR SPINE FUSION ONLY (BMAC) (N/A )  Patient Location: PACU  Anesthesia Type:General  Level of Consciousness: drowsy and patient cooperative  Airway & Oxygen Therapy: Patient Spontanous Breathing and Patient connected to face mask oxygen  Post-op Assessment: Report given to RN and Post -op Vital signs reviewed and stable  Post vital signs: Reviewed and stable  Last Vitals:  Vitals Value Taken Time  BP 139/75 11/03/19 2025  Temp 36.1 C 11/03/19 2025  Pulse 87 11/03/19 2032  Resp 16 11/03/19 2032  SpO2 100 % 11/03/19 2032  Vitals shown include unvalidated device data.  Last Pain:  Vitals:   11/03/19 1213  TempSrc: Tympanic  PainSc: 0-No pain         Complications: No apparent anesthesia complications

## 2019-11-03 NOTE — Anesthesia Preprocedure Evaluation (Signed)
Anesthesia Evaluation  Patient identified by MRN, date of birth, ID band Patient awake    Reviewed: Allergy & Precautions, NPO status , Patient's Chart, lab work & pertinent test results, reviewed documented beta blocker date and time   Airway Mallampati: III  TM Distance: <3 FB     Dental   Pulmonary sleep apnea and Continuous Positive Airway Pressure Ventilation ,    Pulmonary exam normal        Cardiovascular hypertension, Pt. on medications Normal cardiovascular exam     Neuro/Psych PSYCHIATRIC DISORDERS Anxiety Depression Bipolar Disorder CVA    GI/Hepatic hiatal hernia, GERD  ,  Endo/Other  diabetes  Renal/GU Renal disease Bladder dysfunction      Musculoskeletal  (+) Arthritis , Rheumatoid disorders,  Fibromyalgia -  Abdominal Normal abdominal exam  (+)   Peds negative pediatric ROS (+)  Hematology  (+) anemia ,   Anesthesia Other Findings   Reproductive/Obstetrics                             Anesthesia Physical Anesthesia Plan  ASA: III  Anesthesia Plan: General   Post-op Pain Management:    Induction: Intravenous  PONV Risk Score and Plan:   Airway Management Planned: Oral ETT  Additional Equipment:   Intra-op Plan:   Post-operative Plan: Extubation in OR  Informed Consent: I have reviewed the patients History and Physical, chart, labs and discussed the procedure including the risks, benefits and alternatives for the proposed anesthesia with the patient or authorized representative who has indicated his/her understanding and acceptance.     Dental advisory given  Plan Discussed with: CRNA and Surgeon  Anesthesia Plan Comments:         Anesthesia Quick Evaluation

## 2019-11-03 NOTE — H&P (Signed)
History of Present Illness: 11/03/2019 Victoria Austin presents today for surgical intervention.   09/09/2019  Victoria Austin returns to see me. She is having worsening pain in posture. At this point, she is no longer doing well as she was last time I saw her. She would like to move forward with surgical intervention.  06/17/2019 Victoria Austin is here today with a chief complaint of low back pain that wraps around her abdomen and radiates down both posterior thighs. She has a brace that she states she has worn faithfully until this past Sunday.  She had terrible pain through the spring, but has had substantial improvement in her pain over the past several weeks. She does have a family member living with her who is now helping with many things in her life. She is not as physically active, and feels that this may have helped her back pain.  Prolonged standing prolonged walking hurt her pain. She did recently have an MRI scan which showed healing of her fracture.  Conservative measures:  Physical therapy: participated in home health PT this year Multimodal medical therapy including regular antiinflammatories: oxycodone, gabapentin, methocarbamol Injections: has not received epidural steroid injections  Past Surgery: lumbar decompression in [place]  Victoria Austin has no symptoms of cervical myelopathy.  The symptoms are causing a significant impact on the patient's life.   Review of Systems:  A 10 point review of systems is negative, except for the pertinent positives and negatives detailed in the HPI.  Past Medical History: Past Medical History:  Diagnosis Date  . Closed compression fracture of body of L1 vertebra (CMS-HCC)  . Depression  . Hodgkin's lymphoma (CMS-HCC)  . Hyperlipidemia  . Hypertension  . RA (rheumatoid arthritis) (CMS-HCC)  . Stress bladder incontinence, female  . Stroke (CMS-HCC)  . Type 2 diabetes mellitus (CMS-HCC)   Past Surgical History: Past  Surgical History:  Procedure Laterality Date  . lumbar decompression surgery 10/2017  [place]   Allergies  Allergen Reactions  . Fire [employer] Anaphylaxis    BEE STINGS ALSO  . Ciprofloxacin Nausea And Vomiting  . Cyclobenzaprine Nausea And Vomiting  . Diazepam Nausea And Vomiting  . Ketorolac     HALLUCINATIONS  . Ondansetron Nausea And Vomiting    ABRUPT VOMITTING  . Ranitidine Hcl     FEELS LIKE BUGS CRAWLING ALL OVER HER  . Ambien [Zolpidem] Other (See Comments)    Severely sedating  . Bacitracin-Polymyxin B     BURNS SKIN  . Mushroom Extract Complex     BELL PEPPERS TOO CAUSING ITCHING  . Neomycin-Polymyxin-Gramicidin Rash    BURNS SKIN    Current Meds  Medication Sig  . ADVAIR HFA 230-21 MCG/ACT inhaler Inhale 1 puff into the lungs 2 (two) times daily. (Patient taking differently: Inhale 1 puff into the lungs 2 (two) times daily as needed. )  . albuterol (PROAIR HFA) 108 (90 Base) MCG/ACT inhaler Inhale 1 puff into the lungs 2 (two) times daily as needed.  Marland Kitchen alendronate (FOSAMAX) 70 MG tablet Take 70 mg by mouth once a week.   . carbamazepine (TEGRETOL) 200 MG tablet Take 200 mg by mouth 3 (three) times daily.   . cloNIDine (CATAPRES) 0.1 MG tablet Take 1 tablet (0.1 mg total) by mouth daily as needed (blood pressure >180/100).  . clopidogrel (PLAVIX) 75 MG tablet Take 1 tablet (75 mg total) by mouth daily.  . Continuous Blood Gluc Receiver (FREESTYLE LIBRE 14 DAY READER) DEVI 1 Device by  Does not apply route 4 (four) times daily.  . Continuous Blood Gluc Sensor (FREESTYLE LIBRE 14 DAY SENSOR) MISC 1 Device by Does not apply route every 14 (fourteen) days.  . cyanocobalamin (,VITAMIN B-12,) 1000 MCG/ML injection Inject 1,000 mcg into the muscle every 30 (thirty) days.   Marland Kitchen docusate sodium (COLACE) 250 MG capsule Take 250 mg by mouth daily.   . DULoxetine (CYMBALTA) 60 MG capsule Take 60 mg by mouth daily.   . empagliflozin (JARDIANCE) 10 MG TABS tablet Take 10 mg by mouth  daily.  Marland Kitchen EPINEPHrine (EPIPEN 2-PAK) 0.3 mg/0.3 mL IJ SOAJ injection Inject 0.3 mLs (0.3 mg total) into the muscle as needed for anaphylaxis. Then call 911 for ER transport.  . gabapentin (NEURONTIN) 100 MG capsule Take 300 mg PO at bedtime x 7d, then 200 mg PO at bedtime x 7 d, then 100 mg po at bedtime x 7 d (Patient taking differently: Take 300 mg by mouth at bedtime. )  . hydrALAZINE (APRESOLINE) 50 MG tablet Take 1 tablet (50 mg total) by mouth 3 (three) times daily.  Vanessa Kick Ethyl 1 g CAPS Take 2 capsules (2 g total) by mouth 2 (two) times daily.  . Insulin Glargine (LANTUS SOLOSTAR) 100 UNIT/ML Solostar Pen Inject 85 Units into the skin daily. (Patient taking differently: Inject 85 Units into the skin at bedtime. )  . lisinopril (ZESTRIL) 5 MG tablet Take 1 tablet (5 mg total) by mouth daily.  Marland Kitchen LORazepam (ATIVAN) 1 MG tablet Take 1 mg by mouth daily as needed for anxiety or sleep.   Marland Kitchen MAGNESIUM-OXIDE 400 (241.3 Mg) MG tablet Take 1 tablet (400 mg total) by mouth daily. (Patient not taking: Reported on 10/22/2019)  . meclizine (ANTIVERT) 25 MG tablet Take 1 tablet (25 mg total) by mouth 3 (three) times daily as needed for dizziness. (Patient taking differently: Take 25-50 mg by mouth See admin instructions. Take 50 mg by mouth in the morning and 25 mg in the afternoon)  . methocarbamol (ROBAXIN) 750 MG tablet Take 1 tablet (750 mg total) by mouth 2 (two) times daily as needed for muscle spasms.  . mirabegron ER (MYRBETRIQ) 50 MG TB24 tablet Take 1 tablet (50 mg total) by mouth daily. (Patient taking differently: Take 100 mg by mouth daily. )  . NIFEdipine (ADALAT CC) 60 MG 24 hr tablet Take 1 tablet (60 mg total) by mouth 2 (two) times daily.  Marland Kitchen NOVOLOG FLEXPEN 100 UNIT/ML FlexPen Inject 22-34 Units into the skin 3 (three) times daily. With meals  . pantoprazole (PROTONIX) 40 MG tablet Take 1 tablet (40 mg total) by mouth daily.  . primidone (MYSOLINE) 50 MG tablet Take 4 tablets (200 mg  total) by mouth at bedtime.  . promethazine (PHENERGAN) 25 MG tablet Take 1 tablet (25 mg total) by mouth every 6 (six) hours as needed.  . propranolol (INDERAL) 10 MG tablet Take 1 tablet (10 mg total) by mouth 2 (two) times daily. (Patient taking differently: Take 10 mg by mouth daily. )  . rosuvastatin (CRESTOR) 20 MG tablet Take 1 tablet (20 mg total) by mouth daily.  . SURE COMFORT PEN NEEDLES 31G X 5 MM MISC Inject 1 pen as directed 5 (five) times daily as needed.  . trimethoprim (TRIMPEX) 100 MG tablet Take 1 tablet (100 mg total) by mouth daily.  . Vitamin D, Ergocalciferol, (DRISDOL) 1.25 MG (50000 UNIT) CAPS capsule Take 1 capsule (50,000 Units total) by mouth once a week.  . [DISCONTINUED] gabapentin (  NEURONTIN) 300 MG capsule Take 2 capsules (600 mg total) by mouth at bedtime. (Patient taking differently: Take 300 mg by mouth at bedtime. )  . [DISCONTINUED] hydrALAZINE (APRESOLINE) 50 MG tablet Take 1 tablet (50 mg total) by mouth 3 (three) times daily.  . [DISCONTINUED] lisinopril (ZESTRIL) 5 MG tablet Take 5 mg by mouth daily.   . [DISCONTINUED] NOVOLOG FLEXPEN 100 UNIT/ML FlexPen Inject 14 Units into the skin 3 (three) times daily. With meals (Patient taking differently: Inject 24 Units into the skin 4 (four) times daily -  before meals and at bedtime. )  . [DISCONTINUED] oxyCODONE-acetaminophen (PERCOCET) 5-325 MG tablet Take 1-2 tablets by mouth every 8 (eight) hours as needed for severe pain (do not take with other sedating medicine).  . [DISCONTINUED] primidone (MYSOLINE) 250 MG tablet Take 1 tablet (250 mg total) by mouth at bedtime.    Social History: Social History   Tobacco Use  . Smoking status: Never Smoker  . Smokeless tobacco: Never Used  Substance Use Topics  . Alcohol use: Yes  Frequency: Monthly or less  . Drug use: Not on file   Family Medical History: Family History  Problem Relation Age of Onset  . Diabetes Mother   Physical Examination: Vitals:    Vitals:   11/03/19 1213  BP: (!) 148/76  Pulse: 96  Resp: 17  Temp: 98.2 F (36.8 C)  SpO2: 97%    Heart sounds normal no MRG. Chest Clear to Auscultation Bilaterally.  General: Patient is well developed, well nourished, calm, collected, and in no apparent distress. Attention to examination is appropriate.  Psychiatric: Patient is non-anxious.  Head: Pupils equal, round, and reactive to light.  ENT: Oral mucosa appears well hydrated.  Neck: Supple. Full range of motion.  Respiratory: Patient is breathing without any difficulty.  Extremities: No edema.  Vascular: Palpable dorsal pedal pulses.  Skin: On exposed skin, there are no abnormal skin lesions.  NEUROLOGICAL:   Awake, alert, oriented to person, place, and time. Speech is clear and fluent. Fund of knowledge is appropriate.   Cranial Nerves: Pupils equal round and reactive to light. Facial tone is symmetric. Facial sensation is symmetric. Shoulder shrug is symmetric. Tongue protrusion is midline. There is no pronator drift.  ROM of spine: full. Palpation of spine: non tender.   Strength: Side Biceps Triceps Deltoid Interossei Grip Wrist Ext. Wrist Flex.  R 5 5 5 5 5 5 5   L 5 5 5 5 5 5 5    Side Iliopsoas Quads Hamstring PF DF EHL  R 5 5 5 5 5 5   L 5 5 5 5 5 5    Reflexes are 1+ and symmetric at the biceps, triceps, brachioradialis, patella and achilles. Hoffman's is absent. Clonus is not present. Toes are down-going.  Bilateral upper and lower extremity sensation is intact to light touch.  Gait is normal. No difficulty with tandem gait.  No evidence of dysmetria noted.  Medical Decision Making  Imaging: MRI L spine 05/15/2019 IMPRESSION: Old compression fracture at L1 with loss of height anteriorly of 60%. Mild posterior bowing of the posterior margin of the vertebral body but no compressive stenosis. Fracture is essentially completely healed, without any measurable marrow edema. There is  minimal enhancement along the fracture margins which can persist for quite some time.  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  Electronically Signed By: Nelson Chimes M.D. On: 05/15/2019 13:10  I  reviewed all of her x-ray studies from March 2020 until today. She originally had a 14 to 15 degree kyphotic deformity between the top of T12 and the bottom of L2. That is progressed to 27 degrees currently.  I have personally reviewed the images and agree with the above interpretation.  Assessment and Plan: Victoria Austin is a pleasant 69 y.o. female with L1 compression fracture with worsening of her Cobb angle from the top of T12 to the bottom of L2 from 15 to 27 degrees between March 2020 and February 2021.   Plan for T11-L3 posterior fixation with posterior column osteotomies and ORIF of her fracture.   Meade Maw MD, [facility] Department of Neurosurgery

## 2019-11-03 NOTE — Progress Notes (Signed)
Procedure: T11-L3 posterior fusion, T12-L2 posterior column osteotomies Procedure date: 11/03/2019 Diagnosis: thoracic/lumbar radiculopathy   History: Victoria Austin is s/p T11-L3 posterior fusion, T12-L2 posterior column osteotomies  POD0: Tolerated procedure well. Evaluated in post op recovery still disoriented from anesthesia but able to answer questions and obey commands.   Physical Exam: Vitals:   11/03/19 1213 11/03/19 2025  BP: (!) 148/76 139/75  Pulse: 96 89  Resp: 17 16  Temp: 98.2 F (36.8 C) (!) 96.9 F (36.1 C)  SpO2: 97% 100%    Strength:Unable to accurately assess at this time. Able to move all extremities independently Sensation: Unable to accurately assess at this time Skin: Dressing intact  Data:  No results for input(s): NA, K, CL, CO2, BUN, CREATININE, LABGLOM, GLUCOSE, CALCIUM in the last 168 hours. No results for input(s): AST, ALT, ALKPHOS in the last 168 hours.  Invalid input(s): TBILI   No results for input(s): WBC, HGB, HCT, PLT in the last 168 hours. No results for input(s): APTT, INR in the last 168 hours.       Other tests/results: thoracolumbar films pending  Assessment/Plan:  Victoria Austin is POD0 s/p T11-L3 posterior fusion, T12-L2 posterior column osteotomies . Will continue to monitor  - monitor drain output - mobilize - pain control - DVT prophylaxis - PTOT - Brace - Imaging   Marin Olp PA-C Department of Neurosurgery

## 2019-11-03 NOTE — TOC Progression Note (Signed)
Transition of Care [facility]) - Progression Note    Patient Details  Name: Victoria Austin MRN: [id number] Date of Birth: 21-May-1951  Transition of [facility]) CM/SW [place], RN Phone Number: 11/03/2019, 4:09 PM  Clinical Narrative:     Patient plans to go to SNF after DC, PASSR number [id number]       Expected Discharge Plan and Services                                                 Social Determinants of Health (SDOH) Interventions    Readmission Risk Interventions No flowsheet data found.

## 2019-11-03 NOTE — Anesthesia Procedure Notes (Signed)
Procedure Name: Intubation Date/Time: 11/03/2019 4:34 PM Performed by: Jonna Clark, CRNA Pre-anesthesia Checklist: Patient identified, Patient being monitored, Timeout performed, Emergency Drugs available and Suction available Patient Re-evaluated:Patient Re-evaluated prior to induction Oxygen Delivery Method: Circle system utilized Preoxygenation: Pre-oxygenation with 100% oxygen Induction Type: IV induction Ventilation: Mask ventilation without difficulty Laryngoscope Size: Mac and 3 Grade View: Grade I Tube type: Oral Tube size: 7.0 mm Number of attempts: 1 Airway Equipment and Method: Stylet Placement Confirmation: ETT inserted through vocal cords under direct vision,  positive ETCO2 and breath sounds checked- equal and bilateral Secured at: 21 cm Tube secured with: Tape Dental Injury: Teeth and Oropharynx as per pre-operative assessment

## 2019-11-03 NOTE — Consult Note (Signed)
Pharmacy Antibiotic Note  Eilyn Mato is a 69 y.o. female admitted on 11/03/2019 with surgical prophylaxis.  Pharmacy has been consulted for Cefazolin and Vancomycin dosing.  Plan: 1. Cefazolin 2g once; administer within 60 minutes of surgical incision.   2. Vancomycin 1250 mg (15mg /kg) x1; administer within 60 to 120  minutes of surgical incision.    Allergies  Allergen Reactions  . [employer] Anaphylaxis    BEE STINGS ALSO  . Ciprofloxacin Nausea And Vomiting  . Cyclobenzaprine Nausea And Vomiting  . Diazepam Nausea And Vomiting  . Ketorolac     HALLUCINATIONS  . Ondansetron Nausea And Vomiting    ABRUPT VOMITTING  . Ranitidine Hcl     FEELS LIKE BUGS CRAWLING ALL OVER HER  . Ambien [Zolpidem] Other (See Comments)    Severely sedating  . Bacitracin-Polymyxin B     BURNS SKIN  . Mushroom Extract Complex     BELL PEPPERS TOO CAUSING ITCHING  . Neomycin-Polymyxin-Gramicidin Rash    BURNS SKIN     Thank you for allowing pharmacy to be a part of this patient's care.  Rowland Lathe 11/03/2019 12:23 PM

## 2019-11-04 ENCOUNTER — Inpatient Hospital Stay: Payer: Medicare Other

## 2019-11-04 LAB — GLUCOSE, CAPILLARY
Glucose-Capillary: 245 mg/dL — ABNORMAL HIGH (ref 70–99)
Glucose-Capillary: 188 mg/dL — ABNORMAL HIGH (ref 70–99)
Glucose-Capillary: 121 mg/dL — ABNORMAL HIGH (ref 70–99)
Glucose-Capillary: 146 mg/dL — ABNORMAL HIGH (ref 70–99)

## 2019-11-04 MED ORDER — PROCHLORPERAZINE MALEATE 10 MG PO TABS
10.0000 mg | ORAL_TABLET | Freq: Four times a day (QID) | ORAL | Status: DC | PRN
  Administered 2019-11-04: 15:00:00 10 mg via ORAL
  Filled 2019-11-04 (×2): qty 1

## 2019-11-04 MED ORDER — CHLORHEXIDINE GLUCONATE CLOTH 2 % EX PADS
6.0000 | MEDICATED_PAD | Freq: Every day | CUTANEOUS | Status: DC
  Administered 2019-11-04 – 2019-11-08 (×5): 6 via TOPICAL

## 2019-11-04 NOTE — Evaluation (Signed)
Occupational Therapy Evaluation Patient Details Name: Victoria Austin MRN: [id number] DOB: 02-29-1951 Today's Date: 11/04/2019    History of Present Illness Victoria Austin is s/p lumbar fusion (T11-L3 posterior fusion, T12-L2 posterior column osteotomies), POD #1.  Pt's PMH includes lumbar decompression in 2019, depression, Hodgkin's lymphoma, HTN, RA, stroke, DM2, and Bell's palsy.   Clinical Impression   Pt seen for OT evaluation this date, POD#1 from lumbar fusion. Prior to hospital admission, pt required intermittent min assist from her daughter with mobility, ADL, and IADL (namely with bathing). She reports 3-4 falls in past 12 months. Pt has been having increased difficulty with all aspects of mobility and ADL due to back pain. Pt lives with her daughter and 68 year old grandson in a single family home with 3-4 steps to enter and no handrails, 2 stories with her bedroom and bathroom on the second floor.  Pt's daughter is not able to provide 24 hour assistance as she works full time. Currently pt is at [facility] assist for LB ADL tasks, as well as min assist for UB ADL tasks 2/2 decreased strength and BUE tremor. Pt educated in back precautions, brace management, self care skills, bed mobility and functional transfer training, AE/DME for bathing, dressing, and toileting needs, and home/routines modifications and falls prevention strategies to maximize safety and functional independence while minimizing falls risk and maintaining precautions. Pt verbalized understanding of all education/training provided. Pt requires additional assessment of functional transfer and mobility.  OTR provided min assist for BLE management in log rolling from supine <> sit EOB.  Pt will benefit from continued skilled OT services in acute setting to address functional deficits, generalized weakness, and safety and independence in ADLs.  Pt expressed interest in discharging to SNF for additional rehab and assistance in functional tasks,  OTR agrees with this recommendation at this time.     Follow Up Recommendations  SNF    Equipment Recommendations  None recommended by OT    Recommendations for Other Services       Precautions / Restrictions Precautions Precautions: Back Precaution Booklet Issued: No Precaution Comments: no bending, lifting, twisting Required Braces or Orthoses: Spinal Brace Restrictions Weight Bearing Restrictions: No      Mobility Bed Mobility Overal bed mobility: Needs Assistance Bed Mobility: Supine to Sit;Sit to Supine     Supine to sit: Min assist Sit to supine: Min assist   General bed mobility comments: OTR provided min assist for BLE management in log roll technique  Transfers Overall transfer level: Needs assistance               General transfer comment: not assessed    Balance Overall balance assessment: Needs assistance Sitting-balance support: Feet supported;Bilateral upper extremity supported Sitting balance-Leahy Scale: Fair Sitting balance - Comments: poor dynamic sitting balance, requires CGA when BUE unsupported while seated EOB                                   ADL either performed or assessed with clinical judgement   ADL Overall ADL's : Needs assistance/impaired Eating/Feeding: Minimal assistance;Sitting   Grooming: Minimal assistance;Sitting   Upper Body Bathing: Minimal assistance;Sitting   Lower Body Bathing: Moderate assistance;With adaptive equipment;Sitting/lateral leans   Upper Body Dressing : Moderate assistance;Sitting   Lower Body Dressing: Moderate assistance;Sit to/from stand                 General ADL Comments: OOB  activity deferred 2/2 upcoming PT session.  Pt requires min assist for seated upper body ADLs 2/2 BUE tremor and decreased UE strength.  Pt unable to reach feet while seated EOB, requires assistance for donning/doffing footwear and lower body clothing.  Functional transfer and mobility assessment  still needed.     Vision Baseline Vision/History: Wears glasses Wears Glasses: Reading only Patient Visual Report: No change from baseline       Perception     Praxis      Pertinent Vitals/Pain Pain Assessment: Faces Faces Pain Scale: Hurts even more(pt did not rate pain, complained more of stiffness than sharp back pain) Pain Location: back Pain Descriptors / Indicators: Grimacing;Moaning Pain Intervention(s): Limited activity within patient's tolerance;Monitored during session;RN gave pain meds during session     Hand Dominance     Extremity/Trunk Assessment Upper Extremity Assessment Upper Extremity Assessment: Generalized weakness   Lower Extremity Assessment Lower Extremity Assessment: Defer to PT evaluation   Cervical / Trunk Assessment Cervical / Trunk Assessment: Normal   Communication Communication Communication: No difficulties   Cognition Arousal/Alertness: Awake/alert Behavior During Therapy: WFL for tasks assessed/performed Overall Cognitive Status: Within Functional Limits for tasks assessed                                 General Comments: grossly oriented   General Comments  thoracic brace in place while seated EOB    Exercises Other Exercises Other Exercises: provided education re: safety and fall precautions, self care, back precautions, lower body dressing with use of AE, bed mobility, OT role and plan of care Other Exercises: provided min assist for bed mobility   Shoulder Instructions      Home Living Family/patient expects to be discharged to:: Private residence Living Arrangements: Children;Other relatives(lives with daughter and 84 year old grandson) Available Help at Discharge: Available PRN/intermittently(family available PRN, but pt's daughter works full time.  Pt expresses interest in short term rehab) Type of Home: House Home Access: Stairs to enter [employer] of Steps: 3-4 Entrance Stairs-Rails:  None Home Layout: Two level;1/2 bath on main level     Bathroom Shower/Tub: Teacher, early years/pre: Handicapped height     Home Equipment: Environmental consultant - 4 wheels;Cane - single point;Toilet riser;Grab bars - tub/shower;Shower seat          Prior Functioning/Environment Level of Independence: Needs assistance  Gait / Transfers Assistance Needed: mod I with rollator ADL's / Homemaking Assistance Needed: pt's daughter assists with meal prep and stays in the bathroom while pt showers   Comments: Pt spends her days playing games on her phone and helping her grandson with virtual school.  She has been sleeping in her recliner downstairs as she has had limited energy to go upstairs.  She dresses and grooms indepdently, but her daughter stays nearby in the bathroom when she is bathing.  Pt drives, reports 3-4 falls in last 12 months.  She has broken 2 ribs, hit her head, and fractured her back from falls.        OT Problem List: Decreased strength;Decreased range of motion;Decreased activity tolerance;Impaired balance (sitting and/or standing);Decreased safety awareness;Decreased knowledge of use of DME or AE;Decreased knowledge of precautions;Impaired UE functional use;Pain      OT Treatment/Interventions: Self-care/ADL training;Therapeutic exercise;Energy conservation;DME and/or AE instruction;Therapeutic activities;Patient/family education;Balance training    OT Goals(Current goals can be found in the care plan section) Acute Rehab OT Goals Patient  Stated Goal: "to do things for myself" OT Goal Formulation: With patient Time For Goal Achievement: 11/18/19 Potential to Achieve Goals: Good  OT Frequency: Min 1X/week   Barriers to D/C: Decreased caregiver support;Inaccessible home environment  pt's family unavailable to provide care 24/7       Co-evaluation              AM-PAC OT "6 Clicks" Daily Activity     Outcome Measure Help from another person eating meals?: A  Little Help from another person taking care of personal grooming?: A Little Help from another person toileting, which includes using toliet, bedpan, or urinal?: A Lot Help from another person bathing (including washing, rinsing, drying)?: A Lot Help from another person to put on and taking off regular upper body clothing?: A Little Help from another person to put on and taking off regular lower body clothing?: A Lot 6 Click Score: 15   End of Session Equipment Utilized During Treatment: Back brace  Activity Tolerance: Patient tolerated treatment well;Patient limited by pain Patient left: in bed;with call bell/phone within reach;with bed alarm set  OT Visit Diagnosis: Other abnormalities of gait and mobility (R26.89);Muscle weakness (generalized) (M62.81);Pain Pain - part of body: (back)                Time: [id number] OT Time Calculation (min): 41 min Charges:  OT General Charges $OT Visit: 1 Visit OT Evaluation $OT Eval Moderate Complexity: 1 Mod OT Treatments $Self Care/Home Management : 23-37 mins  Myrtie Hawk Morrell Fluke, OTR/L 11/04/19, 11:01 AM

## 2019-11-04 NOTE — TOC Progression Note (Signed)
Transition of Care [facility]) - Progression Note    Patient Details  Name: Victoria Austin MRN: [id number] Date of Birth: 04/05/1951  Transition of Care [facility]) CM/SW [place], RN Phone Number: 11/04/2019, 2:51 PM  Clinical Narrative:     The patient lives at home with her daughter that works, she wants to go to rehab FL2, PASSR and bedsearch sent will review bed offers once obtained       Expected Discharge Plan and Services                                                 Social Determinants of Health (SDOH) Interventions    Readmission Risk Interventions No flowsheet data found.

## 2019-11-04 NOTE — Evaluation (Signed)
Physical Therapy Evaluation Patient Details Name: Victoria Austin MRN: [id number] DOB: 27-Jan-1951 Today's Date: 11/04/2019   History of Present Illness  Victoria Austin is s/p lumbar fusion (T11-L3 posterior fusion, T12-L2 posterior column osteotomies), POD #1.  Pt's PMH includes lumbar decompression in 2019, depression, Hodgkin's lymphoma, HTN, RA, stroke, DM2, and Bell's palsy.  Clinical Impression  Upon evaluation, patient alert and oriented; follows commands and demonstrates good effort with mobility tasks. Patient very eager to participate/progress with session.  Pain rated 3/10, improved with position change and general relaxation. Bilat UE/LE strength and ROM grossly at least 4+/5 throughout; no focal weakness, sensory or coordination deficit appreciated.  Currently requiring mod assist for bed mobility; min assist for sit/stand, basic transfers and gait (15') with RW.  Demonstrates decreased step height/length, slow and guarded performance (due to pain), but no overt buckling or LOB.  Fair/good stability; anticipate consistent progression in subsequent sessions Would benefit from skilled PT to address above deficits and promote optimal return to PLOF.; will follow surgeon's recommendations for DC plan, but anticipate patient okay for discharge home if mobility progression continues.    Follow Up Recommendations Follow surgeon's recommendation for DC plan and follow-up therapies    Equipment Recommendations  Rolling walker with 5" wheels;3in1 (PT)    Recommendations for Other Services       Precautions / Restrictions Precautions Precautions: Back;Fall Precaution Booklet Issued: No Precaution Comments: no bending, lifting, twisting Required Braces or Orthoses: Spinal Brace Spinal Brace: Applied in sitting position Restrictions Weight Bearing Restrictions: No      Mobility  Bed Mobility Overal bed mobility: Needs Assistance Bed Mobility: Supine to Sit     Supine to sit: Mod  assist(log rolling) Sit to supine: Min assist   General bed mobility comments: for truncal elevation; good awareness of and integration of back precautions  Transfers Overall transfer level: Needs assistance Equipment used: Rolling walker (2 wheeled) Transfers: Sit to/from Stand Sit to Stand: Min assist         General transfer comment: cuing for hand placement; min assist for lift off.  Ambulation/Gait Ambulation/Gait assistance: Min assist Gait Distance (Feet): 15 Feet Assistive device: Rolling walker (2 wheeled)       General Gait Details: decreased step height/length, slow and guarded performance (due to pain), but no overt buckling or LOB.  Fair/good stability; anticipate consistent progression in subsequent sessions  Stairs            Wheelchair Mobility    Modified Rankin (Stroke Patients Only)       Balance Overall balance assessment: Needs assistance Sitting-balance support: No upper extremity supported;Feet supported Sitting balance-Leahy Scale: Good Sitting balance - Comments: poor dynamic sitting balance, requires CGA when BUE unsupported while seated EOB   Standing balance support: Bilateral upper extremity supported Standing balance-Leahy Scale: Fair                               Pertinent Vitals/Pain Pain Assessment: 0-10 Pain Score: 4  Faces Pain Scale: Hurts even more(pt did not rate pain, complained more of stiffness than sharp back pain) Pain Location: back Pain Descriptors / Indicators: Grimacing;Moaning;Aching Pain Intervention(s): Limited activity within patient's tolerance;Monitored during session;Repositioned    Home Living Family/patient expects to be discharged to:: Private residence Living Arrangements: Children;Other relatives Available Help at Discharge: Available PRN/intermittently Type of Home: House Home Access: Stairs to enter Entrance Stairs-Rails: None Entrance Stairs-Number of Steps: 3-4 Home Layout: Two  level;1/2 bath on main level Home Equipment: Walker - 4 wheels;Cane - single point;Toilet riser;Grab bars - tub/shower;Shower seat      Prior Function Level of Independence: Independent with assistive device(s)   Gait / Transfers Assistance Needed: mod I with rollator  ADL's / Homemaking Assistance Needed: pt's daughter assists with meal prep and stays in the bathroom while pt showers  Comments: Mod indep for ADLs, household and limited community mobilization, intermittent use of RW/SPC as needed.  + driving, 3-4 falls in previous year.  Has stayed on main level of home in recent months due to progresisve difficulty with mobility and pain.  Daughter assists with household responsibilities as needed.     Hand Dominance   Dominant Hand: Right    Extremity/Trunk Assessment   Upper Extremity Assessment Upper Extremity Assessment: Overall WFL for tasks assessed    Lower Extremity Assessment Lower Extremity Assessment: Overall WFL for tasks assessed(grossly at least 4+/5 throughout; no focal weakness, sensory or coordination deficit appreciated)    Cervical / Trunk Assessment Cervical / Trunk Assessment: Normal  Communication   Communication: No difficulties  Cognition Arousal/Alertness: Awake/alert Behavior During Therapy: WFL for tasks assessed/performed Overall Cognitive Status: Within Functional Limits for tasks assessed                                 General Comments: very eager for mobility efforts      General Comments General comments (skin integrity, edema, etc.): thoracic brace in place while seated EOB    Exercises Other Exercises Other Exercises: Adjusted brace for improved comfort and positioning; reviewed donning/doffing and wear scheduled.  Reviewed back precautions and overall mobility progression.  Patient voiced undestanding of all information. Other Exercises: Sit/stand from edge of bed x3 with RW, min assist; forward/backward stepping (x5')  with RW, min assist.   Assessment/Plan    PT Assessment Patient needs continued PT services  PT Problem List Decreased activity tolerance;Decreased balance;Decreased mobility;Decreased coordination;Decreased knowledge of use of DME;Decreased safety awareness;Decreased knowledge of precautions;Pain       PT Treatment Interventions DME instruction;Gait training;Stair training;Functional mobility training;Therapeutic activities;Therapeutic exercise;Balance training;Cognitive remediation;Patient/family education    PT Goals (Current goals can be found in the Care Plan section)  Acute Rehab PT Goals Patient Stated Goal: "to do things for myself" PT Goal Formulation: With patient Time For Goal Achievement: 11/18/19 Potential to Achieve Goals: Good    Frequency 7X/week   Barriers to discharge        Co-evaluation               AM-PAC PT "6 Clicks" Mobility  Outcome Measure Help needed turning from your back to your side while in a flat bed without using bedrails?: A Little Help needed moving from lying on your back to sitting on the side of a flat bed without using bedrails?: A Little Help needed moving to and from a bed to a chair (including a wheelchair)?: A Little Help needed standing up from a chair using your arms (e.g., wheelchair or bedside chair)?: A Little Help needed to walk in hospital room?: A Little Help needed climbing 3-5 steps with a railing? : A Little 6 Click Score: 18    End of Session Equipment Utilized During Treatment: Gait belt Activity Tolerance: Patient tolerated treatment well Patient left: in chair;with call bell/phone within reach Nurse Communication: Mobility status PT Visit Diagnosis: Muscle weakness (generalized) (M62.81);Difficulty in walking, not elsewhere classified (  R26.2);Pain    Time: [id number] PT Time Calculation (min) (ACUTE ONLY): 41 min   Charges:   PT Evaluation $PT Eval Moderate Complexity: 1 Mod PT Treatments $Therapeutic  Activity: 23-37 mins        Laderius Valbuena H. Owens Shark, PT, DPT, NCS 11/04/19, 12:09 PM [phone]

## 2019-11-04 NOTE — NC FL2 (Signed)
[place] LEVEL OF CARE SCREENING TOOL     IDENTIFICATION  Patient Name: Victoria Austin Birthdate: 06/17/1951 Sex: female Admission Date (Current Location): 11/03/2019  [place] and [place] Number:  Engineering geologist and Address:  [facility], [address], [place], [place] [address]      Provider Number: [id number]  Attending Physician Name and Address:  Meade Maw, MD  Relative Name and Phone Number:  Vito Backers daughter [phone]    Current Level of Care: Hospital Recommended Level of Care: [place] Prior Approval Number:    Date Approved/Denied:   PASRR Number: [id number]  Discharge Plan: SNF    Current Diagnoses: Patient Active Problem List   Diagnosis Date Noted  . S/P lumbar fusion 11/03/2019  . Hereditary essential tremor 10/08/2019  . Polypharmacy 10/08/2019  . OSA on CPAP 09/01/2019  . Anticoagulant long-term use 09/01/2019  . Lumbar degenerative disc disease 06/08/2019  . Lumbar facet arthropathy 06/08/2019  . Chronic pain syndrome 06/08/2019  . Capsulitis 05/20/2019  . Benign essential HTN 04/15/2019  . Constipation 04/13/2019  . Vertigo 04/13/2019  . Hypertriglyceridemia 04/13/2019  . 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 Diabetic neuropathy ([place]) 02/15/2019  . Cerebrovascular accident (CVA) ([place]) 01/08/2019  . Incontinence   . RA (rheumatoid arthritis) ([place])   . Temporal arteritis ([place]) 01/01/2019  . Goals of care, counseling/discussion 12/26/2018  . Albuminuria 12/18/2018  . Dyslipidemia associated with type 2 diabetes mellitus ([place]) 12/11/2018  . Compression fracture of L1 lumbar vertebra ([place]) 12/11/2018  . 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 Anxiety   . Depression   . Diverticulitis 05/05/2018  . Hypertensive disorder 05/05/2018  . Fibromyalgia 03/21/2016  . Irritable bowel syndrome 09/19/2014  . GERD (gastroesophageal reflux  disease) 04/03/2011  . Hiatal hernia 01/12/2007  . Bipolar 1 disorder ([place]) 07/29/2004    Orientation RESPIRATION BLADDER Height & Weight     Self, Time, Situation, Place    Continent Weight: 85.1 kg Height:  5\' 4"  (162.6 cm)  BEHAVIORAL SYMPTOMS/MOOD NEUROLOGICAL BOWEL NUTRITION STATUS      Continent    AMBULATORY STATUS COMMUNICATION OF NEEDS Skin   Extensive Assist Verbally Surgical wounds                       Personal Care Assistance Level of Assistance  Bathing, Dressing Bathing Assistance: Limited assistance   Dressing Assistance: Limited assistance     Functional Limitations Info             SPECIAL CARE FACTORS FREQUENCY  PT (By licensed PT)     PT Frequency: 5 times per week              Contractures Contractures Info: Not present    Additional Factors Info  Allergies   Allergies Info: Fire Ant, Ciprofloxacin, Cyclobenzaprine, Diazepam, Ketorolac, Ondansetron, Ranitidine Hcl, Ambien , Bacitracin-polymyxin B, Mushroom Extract Complex, Neomycin-polymyxin-gramicidin           Current Medications (11/04/2019):  This is the current hospital active medication list Current Facility-Administered Medications  Medication Dose Route Frequency Provider Last Rate Last Admin  . 0.9 %  sodium chloride infusion   Intravenous Continuous Marin Olp, PA-C 75 mL/hr at 11/04/19 0718 New Bag at 11/04/19 0718  . 0.9 %  sodium chloride infusion  250 mL Intravenous Continuous Marin Olp, PA-C      . acetaminophen (TYLENOL) tablet 650 mg  650 mg Oral Q4H PRN  Marin Olp, PA-C       Or  . acetaminophen (TYLENOL) suppository 650 mg  650 mg Rectal Q4H PRN Marin Olp, PA-C      . acetaminophen (TYLENOL) tablet 1,000 mg  1,000 mg Oral Q6H Marin Olp, PA-C   1,000 mg at 11/04/19 1140  . albuterol (PROVENTIL) (2.5 MG/3ML) 0.083% nebulizer solution 2.5 mg  2.5 mg Inhalation BID PRN Marin Olp, PA-C      . bisacodyl (DULCOLAX) EC tablet 5 mg  5 mg Oral Daily  PRN Marin Olp, PA-C      . carbamazepine (TEGRETOL) tablet 200 mg  200 mg Oral TID Marin Olp, PA-C   200 mg at 11/04/19 0849  . Chlorhexidine Gluconate Cloth 2 % PADS 6 each  6 each Topical Daily Meade Maw, MD   6 each at 11/04/19 0915  . cloNIDine (CATAPRES) tablet 0.1 mg  0.1 mg Oral Daily PRN Marin Olp, PA-C      . DULoxetine (CYMBALTA) DR capsule 60 mg  60 mg Oral Daily Marin Olp, PA-C   60 mg at 11/04/19 0850  . gabapentin (NEURONTIN) capsule 300 mg  300 mg Oral QHS Marin Olp, PA-C      . hydrALAZINE (APRESOLINE) tablet 50 mg  50 mg Oral TID Marin Olp, PA-C   50 mg at 11/04/19 0850  . HYDROmorphone (DILAUDID) injection 0.5 mg  0.5 mg Intravenous Q2H PRN Marin Olp, PA-C      . insulin aspart (novoLOG) injection 0-15 Units  0-15 Units Subcutaneous TID WC Marin Olp, PA-C   3 Units at 11/04/19 1220  . insulin aspart (novoLOG) injection 22 Units  22 Units Subcutaneous TID with meals Marin Olp, PA-C   22 Units at 11/04/19 1221  . insulin glargine (LANTUS) injection 85 Units  85 Units Subcutaneous QHS Marin Olp, PA-C   85 Units at 11/04/19 0118  . lisinopril (ZESTRIL) tablet 5 mg  5 mg Oral Daily Marin Olp, PA-C   5 mg at 11/04/19 0849  . loratadine (CLARITIN) tablet 10 mg  10 mg Oral Daily Marin Olp, PA-C   10 mg at 11/04/19 0849  . LORazepam (ATIVAN) tablet 1 mg  1 mg Oral Daily PRN Marin Olp, PA-C      . magnesium citrate solution 1 Bottle  1 Bottle Oral Once PRN Marin Olp, PA-C      . meclizine (ANTIVERT) tablet 25 mg  25 mg Oral QPM Meade Maw, MD      . meclizine (ANTIVERT) tablet 50 mg  50 mg Oral q morning - 10a Marin Olp, PA-C   50 mg at 11/04/19 0851  . menthol-cetylpyridinium (CEPACOL) lozenge 3 mg  1 lozenge Oral PRN Marin Olp, PA-C       Or  . phenol (CHLORASEPTIC) mouth spray 1 spray  1 spray Mouth/Throat PRN Marin Olp, PA-C      . methocarbamol (ROBAXIN) tablet 750 mg  750 mg Oral Q6H Marin Olp,  PA-C   750 mg at 11/04/19 0847   Or  . methocarbamol (ROBAXIN) 500 mg in dextrose 5 % 50 mL IVPB  500 mg Intravenous Q6H Marin Olp, PA-C   Stopped at 11/03/19 2106  . mirabegron ER (MYRBETRIQ) tablet 100 mg  100 mg Oral Daily Marin Olp, PA-C   100 mg at 11/04/19 0851  . mometasone-formoterol (DULERA) 200-5 MCG/ACT inhaler 2 puff  2 puff Inhalation BID Marin Olp, PA-C   2 puff at 11/04/19 [phone]  . montelukast (SINGULAIR) tablet 10 mg  10  mg Oral Daily Marin Olp, PA-C   10 mg at 11/04/19 0849  . NIFEdipine (ADALAT CC) 24 hr tablet 60 mg  60 mg Oral BID Marin Olp, PA-C   60 mg at 11/04/19 0850  . omega-3 acid ethyl esters (LOVAZA) capsule 2 g  2 g Oral BID Marin Olp, PA-C   2 g at 11/04/19 0847  . ondansetron (ZOFRAN) tablet 4 mg  4 mg Oral Q6H PRN Marin Olp, PA-C       Or  . ondansetron [facility]) injection 4 mg  4 mg Intravenous Q6H PRN Marin Olp, PA-C      . oxyCODONE (Oxy IR/ROXICODONE) immediate release tablet 10 mg  10 mg Oral Q3H PRN Marin Olp, PA-C   10 mg at 11/04/19 1139  . oxyCODONE (Oxy IR/ROXICODONE) immediate release tablet 5 mg  5 mg Oral Q3H PRN Marin Olp, PA-C      . pantoprazole (PROTONIX) EC tablet 40 mg  40 mg Oral Daily Marin Olp, PA-C   40 mg at 11/04/19 0850  . polyethylene glycol (MIRALAX / GLYCOLAX) packet 17 g  17 g Oral Daily PRN Marin Olp, PA-C      . primidone (MYSOLINE) tablet 200 mg  200 mg Oral QHS Ferri, Amanda, PA-C      . prochlorperazine (COMPAZINE) tablet 10 mg  10 mg Oral Q6H PRN Meade Maw, MD      . propranolol (INDERAL) tablet 10 mg  10 mg Oral Daily Marin Olp, PA-C   10 mg at 11/04/19 0156  . rosuvastatin (CRESTOR) tablet 20 mg  20 mg Oral Daily Marin Olp, PA-C   20 mg at 11/04/19 0848  . senna (SENOKOT) tablet 8.6 mg  1 tablet Oral BID Marin Olp, PA-C   8.6 mg at 11/04/19 0849  . sodium chloride flush (NS) 0.9 % injection 3 mL  3 mL Intravenous Q12H Marin Olp, PA-C      . sodium chloride  flush (NS) 0.9 % injection 3 mL  3 mL Intravenous PRN Marin Olp, PA-C      . trimethoprim (TRIMPEX) tablet 100 mg  100 mg Oral Daily Marin Olp, PA-C   100 mg at 11/04/19 [id number]     Discharge Medications: Please see discharge summary for a list of discharge medications.  Relevant Imaging Results:  Relevant Lab Results:   Additional Information SS# [id number]  Su Hilt, RN

## 2019-11-04 NOTE — Progress Notes (Signed)
Procedure: T11-L3 posterior fusion, T12-L2 posterior column osteotomies Procedure date: 11/03/2019 Diagnosis: thoracic/lumbar radiculopathy   History: Victoria Austin is s/p T11-L3 posterior fusion, T12-L2 posterior column osteotomies  POD1: Recovering well. Pain seems well controlled on current pain regimen. No new upper or lower extremity complaints. Drain outputs:  JP: 25 Hemovac: 350  POD0: Tolerated procedure well. Evaluated in post op recovery still disoriented from anesthesia but able to answer questions and obey commands.   Physical Exam: Vitals:   11/04/19 0038 11/04/19 0510  BP: 123/66 133/82  Pulse: 92 82  Resp: 18 18  Temp: 98.2 F (36.8 C) 97.6 F (36.4 C)  SpO2: 95% 95%    Strength: 5/5 throughout upper and lower extremities Sensation: Intact and symmetric throughout upper and lower extremities.  Skin: Dressing intact  Data:  No results for input(s): NA, K, CL, CO2, BUN, CREATININE, LABGLOM, GLUCOSE, CALCIUM in the last 168 hours. No results for input(s): AST, ALT, ALKPHOS in the last 168 hours.  Invalid input(s): TBILI   No results for input(s): WBC, HGB, HCT, PLT in the last 168 hours. No results for input(s): APTT, INR in the last 168 hours.       Other tests/results: thoracolumbar films pending  Assessment/Plan:  Victoria Austin is POD0 s/p T11-L3 posterior fusion, T12-L2 posterior column osteotomies . Will continue to monitor  - monitor drain output - mobilize - pain control - DVT prophylaxis - PTOT - Brace - received - Imaging - pending - catheter care  - maintain until discharge due to baseline incontinence.   Marin Olp PA-C Department of Neurosurgery

## 2019-11-04 NOTE — Plan of Care (Signed)
  Problem: Coping: Goal: Level of anxiety will decrease Outcome: Completed/Met

## 2019-11-05 LAB — BASIC METABOLIC PANEL
Anion gap: 7 (ref 5–15)
BUN: 20 mg/dL (ref 8–23)
CO2: 21 mmol/L — ABNORMAL LOW (ref 22–32)
Calcium: 7.9 mg/dL — ABNORMAL LOW (ref 8.9–10.3)
Chloride: 106 mmol/L (ref 98–111)
Creatinine, Ser: 0.9 mg/dL (ref 0.44–1.00)
GFR calc Af Amer: >60 mL/min (ref >60)
GFR calc non Af Amer: >60 mL/min (ref >60)
Glucose, Bld: 228 mg/dL — ABNORMAL HIGH (ref 70–99)
Potassium: 3.4 mmol/L — ABNORMAL LOW (ref 3.5–5.1)
Sodium: 134 mmol/L — ABNORMAL LOW (ref 135–145)

## 2019-11-05 LAB — GLUCOSE, CAPILLARY
Glucose-Capillary: 155 mg/dL — ABNORMAL HIGH (ref 70–99)
Glucose-Capillary: 171 mg/dL — ABNORMAL HIGH (ref 70–99)
Glucose-Capillary: 44 mg/dL — CL (ref 70–99)
Glucose-Capillary: 88 mg/dL (ref 70–99)
Glucose-Capillary: 185 mg/dL — ABNORMAL HIGH (ref 70–99)

## 2019-11-05 LAB — HEMOGLOBIN A1C
Hgb A1c MFr Bld: 6.9 % — ABNORMAL HIGH (ref 4.8–5.6)
Mean Plasma Glucose: 151 mg/dL

## 2019-11-05 NOTE — Care Management Important Message (Signed)
Important Message  Patient Details  Name: Victoria Austin MRN: [id number] Date of Birth: 1950/08/19   Medicare Important Message Given:  N/A - LOS <3 / Initial given by admissions     Victoria Austin 11/05/2019, 7:36 AM

## 2019-11-05 NOTE — Progress Notes (Signed)
Procedure: T11-L3 posterior fusion, T12-L2 posterior column osteotomies Procedure date: 11/03/2019 Diagnosis: thoracic/lumbar radiculopathy   History: Victoria Austin is s/p T11-L3 posterior fusion, T12-L2 posterior column osteotomies  POD 2: Continues to do well.  Pain currently rated 5/10.  She is still very limited on ability to move around in the bed without assistance (sitting up from laying position).  Able to eat without issue.  Foley catheter remains continued. Drain output total yesterday 430.  POD1: Recovering well. Pain seems well controlled on current pain regimen. No new upper or lower extremity complaints. Drain outputs:  JP: 25 Hemovac: 350  POD0: Tolerated procedure well. Evaluated in post op recovery still disoriented from anesthesia but able to answer questions and obey commands.   Physical Exam: Vitals:   11/05/19 0444 11/05/19 0737  BP: (!) 122/54 (!) 124/51  Pulse: 84 81  Resp: 16 18  Temp: 99.9 F (37.7 C) 99.6 F (37.6 C)  SpO2: 95% 97%    Strength: 5/5 throughout upper and lower extremities Sensation: Intact and symmetric throughout upper and lower extremities.  Skin: Dressing intact, clean, dry  Data:  No results for input(s): NA, K, CL, CO2, BUN, CREATININE, LABGLOM, GLUCOSE, CALCIUM in the last 168 hours. No results for input(s): AST, ALT, ALKPHOS in the last 168 hours.  Invalid input(s): TBILI   No results for input(s): WBC, HGB, HCT, PLT in the last 168 hours. No results for input(s): APTT, INR in the last 168 hours.       Other tests/results:  EXAM: LUMBAR SPINE - 2-3 VIEW 11/04/2019  COMPARISON:  Intraoperative fluoroscopic study dated 11/03/2019.  FINDINGS: L1 compression fracture with anterior wedging. There has been placement of posterior spinal fusion hardware at T11-L3. The hardware appears intact as visualized. Drainage tube and midline vertical lower back cutaneous surgical clips. Atherosclerotic calcification of the  aorta.  IMPRESSION: Status post posterior spinal fusion at T11-L3. Assessment/Plan:  Victoria Austin is POD2 s/p T11-L3 posterior fusion, T12-L2 posterior column osteotomies . Will continue to monitor  - monitor drain output - mobilize - pain control - DVT prophylaxis - PTOT - Brace - received - Imaging -completed - catheter care  - maintain until discharge due to baseline incontinence.  -Facility placement  Marin Olp PA-C Department of Neurosurgery

## 2019-11-05 NOTE — Progress Notes (Signed)
Physical Therapy Treatment Patient Details Name: Victoria Austin MRN: [id number] DOB: May 19, 1951 Today's Date: 11/05/2019    History of Present Illness Victoria Austin is s/p lumbar fusion (T11-L3 posterior fusion, T12-L2 posterior column osteotomies), POD #1.  Pt's PMH includes lumbar decompression in 2019, depression, Hodgkin's lymphoma, HTN, RA, stroke, DM2, and Bell's palsy.    PT Comments    Significant improvement in gait distance and overall activity tolerance this date, completing full lap around nursing station with no greater than cga/sup level of assist.  Pain well-controlled at 3/10; remains very motivated to participate/progress.  Will plan to attempt steps next session.    Follow Up Recommendations  Home health PT     Equipment Recommendations  Rolling walker with 5" wheels;3in1 (PT)    Recommendations for Other Services       Precautions / Restrictions Precautions Precautions: Back;Fall Required Braces or Orthoses: Spinal Brace Spinal Brace: Applied in sitting position Restrictions Weight Bearing Restrictions: No    Mobility  Bed Mobility Overal bed mobility: Needs Assistance Bed Mobility: Supine to Sit     Supine to sit: Supervision     General bed mobility comments: heavy use of UEs for truncal elevation  Transfers Overall transfer level: Needs assistance Equipment used: Rolling walker (2 wheeled) Transfers: Sit to/from Stand Sit to Stand: Min guard         General transfer comment: improved comfort/confidence with movement transition  Ambulation/Gait Ambulation/Gait assistance: Supervision Gait Distance (Feet): 200 Feet         General Gait Details: reciprocal stepping pattern with improved fluidity and overall cadence; no buckling or LOB; minimal increase in pain with mobility tasks   Stairs             Wheelchair Mobility    Modified Rankin (Stroke Patients Only)       Balance Overall balance assessment: Needs  assistance Sitting-balance support: No upper extremity supported;Feet supported Sitting balance-Leahy Scale: Good       Standing balance-Leahy Scale: Fair                              Cognition Arousal/Alertness: Awake/alert Behavior During Therapy: WFL for tasks assessed/performed Overall Cognitive Status: Within Functional Limits for tasks assessed                                        Exercises Other Exercises Other Exercises: Mod indep with brace donning/doffing; good recall of back precautions Other Exercises: Seated LE therex, 1x15, active ROM for muscular strength/endurance    General Comments        Pertinent Vitals/Pain Pain Assessment: 0-10 Pain Score: 4  Pain Location: back Pain Descriptors / Indicators: Grimacing;Moaning;Aching Pain Intervention(s): Limited activity within patient's tolerance;Monitored during session;Repositioned    Home Living                      Prior Function            PT Goals (current goals can now be found in the care plan section) Acute Rehab PT Goals Patient Stated Goal: "to do things for myself" PT Goal Formulation: With patient Time For Goal Achievement: 11/18/19 Potential to Achieve Goals: Good Progress towards PT goals: Progressing toward goals    Frequency    7X/week      PT Plan Current plan remains appropriate  Co-evaluation              AM-PAC PT "6 Clicks" Mobility   Outcome Measure  Help needed turning from your back to your side while in a flat bed without using bedrails?: None Help needed moving from lying on your back to sitting on the side of a flat bed without using bedrails?: None Help needed moving to and from a bed to a chair (including a wheelchair)?: None Help needed standing up from a chair using your arms (e.g., wheelchair or bedside chair)?: A Little Help needed to walk in hospital room?: A Little Help needed climbing 3-5 steps with a railing? :  A Little 6 Click Score: 21    End of Session Equipment Utilized During Treatment: Gait belt Activity Tolerance: Patient tolerated treatment well Patient left: in chair;with call bell/phone within reach Nurse Communication: Mobility status PT Visit Diagnosis: Muscle weakness (generalized) (M62.81);Difficulty in walking, not elsewhere classified (R26.2);Pain     Time: 1032-1105 PT Time Calculation (min) (ACUTE ONLY): 33 min  Charges:  $Gait Training: 8-22 mins $Therapeutic Exercise: 8-22 mins                     Victoria Austin, PT, DPT, NCS 11/05/19, 1:45 PM [phone]

## 2019-11-05 NOTE — TOC Progression Note (Signed)
Transition of Care [facility]) - Progression Note    Patient Details  Name: Victoria Austin MRN: [id number] Date of Birth: 1951-04-29  Transition of Care [facility]) CM/SW [place], RN Phone Number: 11/05/2019, 11:18 AM  Clinical Narrative:     Spoke with the patient about rehab provided her with the bed offers, she has chosen [facility], I accepted the bed offer and notified Tammy at [facility], anticipate DC on Sat       Expected Discharge Plan and Services                                                 Social Determinants of Health (SDOH) Interventions    Readmission Risk Interventions No flowsheet data found.

## 2019-11-06 LAB — GLUCOSE, CAPILLARY
Glucose-Capillary: 151 mg/dL — ABNORMAL HIGH (ref 70–99)
Glucose-Capillary: 220 mg/dL — ABNORMAL HIGH (ref 70–99)
Glucose-Capillary: 169 mg/dL — ABNORMAL HIGH (ref 70–99)
Glucose-Capillary: 131 mg/dL — ABNORMAL HIGH (ref 70–99)

## 2019-11-06 MED ORDER — ENOXAPARIN SODIUM 40 MG/0.4ML ~~LOC~~ SOLN
40.0000 mg | SUBCUTANEOUS | Status: DC
  Administered 2019-11-06 – 2019-11-08 (×3): 40 mg via SUBCUTANEOUS
  Filled 2019-11-06 (×3): qty 0.4

## 2019-11-06 MED ORDER — CELECOXIB 100 MG PO CAPS
100.0000 mg | ORAL_CAPSULE | Freq: Two times a day (BID) | ORAL | Status: DC
  Administered 2019-11-06 – 2019-11-08 (×6): 100 mg via ORAL
  Filled 2019-11-06 (×6): qty 1

## 2019-11-06 MED ORDER — METHOCARBAMOL 500 MG PO TABS
1000.0000 mg | ORAL_TABLET | Freq: Four times a day (QID) | ORAL | Status: DC
  Administered 2019-11-06 – 2019-11-08 (×8): 1000 mg via ORAL
  Filled 2019-11-06 (×9): qty 2

## 2019-11-06 MED ORDER — BISACODYL 10 MG RE SUPP
10.0000 mg | Freq: Once | RECTAL | Status: AC
  Administered 2019-11-06: 11:00:00 10 mg via RECTAL
  Filled 2019-11-06: qty 1

## 2019-11-06 MED ORDER — METHOCARBAMOL 1000 MG/10ML IJ SOLN
500.0000 mg | Freq: Four times a day (QID) | INTRAVENOUS | Status: DC
  Filled 2019-11-06: qty 5

## 2019-11-06 MED ORDER — FLEET ENEMA 7-19 GM/118ML RE ENEM: 1.0000 | ENEMA | Freq: Once | RECTAL | Status: DC

## 2019-11-06 NOTE — Progress Notes (Signed)
Procedure: T11-L3 posterior fusion, T12-L2 posterior column osteotomies Procedure date: 11/03/2019 Diagnosis: thoracic/lumbar radiculopathy   History: Victoria Austin is s/p T11-L3 posterior fusion, T12-L2 posterior column osteotomies  POD3: Pain continues to improve. Ambulated with PT but finding it very difficult to log roll in the bed without assistance. Complains of abdominal pain and inability have BM. Catheter in place.  Drain output total yesterday 270  POD 2: Continues to do well.  Pain currently rated 5/10.  She is still very limited on ability to move around in the bed without assistance (sitting up from laying position).  Able to eat without issue.  Foley catheter remains continued. Drain output total yesterday 430.  POD1: Recovering well. Pain seems well controlled on current pain regimen. No new upper or lower extremity complaints. Drain outputs:  JP: 25 Hemovac: 350  POD0: Tolerated procedure well. Evaluated in post op recovery still disoriented from anesthesia but able to answer questions and obey commands.   Physical Exam: Vitals:   11/06/19 0448 11/06/19 0820  BP: (!) 145/61 (!) 137/58  Pulse: 84 75  Resp: 18   Temp: 99.4 F (37.4 C) 97.8 F (36.6 C)  SpO2: 95% 93%    Strength: 5/5 throughout upper and lower extremities Sensation: Intact and symmetric throughout upper and lower extremities.  Skin: Dressing intact, clean, dry  Data:  Recent Labs  Lab 11/05/19 1105  NA 134*  K 3.4*  CL 106  CO2 21*  BUN 20  CREATININE 0.90  GLUCOSE 228*  CALCIUM 7.9*   No results for input(s): AST, ALT, ALKPHOS in the last 168 hours.  Invalid input(s): TBILI   No results for input(s): WBC, HGB, HCT, PLT in the last 168 hours. No results for input(s): APTT, INR in the last 168 hours.       Other tests/results:  EXAM: LUMBAR SPINE - 2-3 VIEW 11/04/2019  COMPARISON:  Intraoperative fluoroscopic study dated 11/03/2019.  FINDINGS: L1 compression fracture with  anterior wedging. There has been placement of posterior spinal fusion hardware at T11-L3. The hardware appears intact as visualized. Drainage tube and midline vertical lower back cutaneous surgical clips. Atherosclerotic calcification of the aorta.  IMPRESSION: Status post posterior spinal fusion at T11-L3.  Assessment/Plan:  [name] is POD2 s/p T11-L3 posterior fusion, T12-L2 posterior column osteotomies . Will continue to monitor  - monitor drain output - mobilize - pain control - increased methocarbamol to 1000, added celebrex. She does not want to take narcotic medications.  - DVT prophylaxis - PTOT - Brace - received - Imaging -completed - catheter care  - maintain until discharge due to baseline incontinence.  -Facility placement - bed secured at [facility]. D/c when drain output has minimized   Marin Olp PA-C Department of Neurosurgery

## 2019-11-06 NOTE — Plan of Care (Signed)
Patient initially was extremely miserable attempting to have a bowel movement, but has had one later this shift and reports feeling bettter.  Per PA, foley catheter is to remain in due to patient's incontinence, discomfort and limited mobility at this time.  Declines pain medicines.  Will continue to assess.

## 2019-11-06 NOTE — Progress Notes (Signed)
Physical Therapy Treatment Patient Details Name: Victoria Austin MRN: [id number] DOB: [birth year] Today's Date: 11/06/2019    History of Present Illness [name] is s/p lumbar fusion (T11-L3 posterior fusion, T12-L2 posterior column osteotomies), POD #1.  Pt's PMH includes lumbar decompression in 2019, depression, Hodgkin's lymphoma, HTN, RA, stroke, DM2, and Bell's palsy.    PT Comments    Pt was long sitting in bed upon arriving. She agrees to PT session and is cooperative and pleasant throughout. She was able to exit R side of bed without assistance. Stood and ambulated 200 ft with supervision. No LOB or unsteadiness noted. Pt then was able to ascend/descend 4 stair with BUE support on rails. No difficulty with stairs. She returned to room and was repositioned in bed post session. Lengthy discussion about DC disposition. Pt continues to feel she will benefit from SNF at DC since she can not perform personal care without assistance. Therapist recommends home with HHPT. Acute PT will continue to follow per current POC and progress as able per pt tolerance.    Follow Up Recommendations  Home health PT;Other (comment)(pt does not feel she can manage at home and request SNF)     Equipment Recommendations  Rolling walker with 5" wheels;3in1 (PT)    Recommendations for Other Services       Precautions / Restrictions Precautions Precautions: Back;Fall Precaution Booklet Issued: No Precaution Comments: no bending, lifting, twisting Required Braces or Orthoses: Spinal Brace Spinal Brace: Applied in sitting position Restrictions Weight Bearing Restrictions: No    Mobility  Bed Mobility Overal bed mobility: Needs Assistance Bed Mobility: Supine to Sit     Supine to sit: Supervision Sit to supine: Supervision   General bed mobility comments: Pt was able to exit and re-enter bed with supervision. Vcs only for improved technique  Transfers Overall transfer level: Needs  assistance Equipment used: Rolling walker (2 wheeled) Transfers: Sit to/from Stand Sit to Stand: Supervision;Min guard         General transfer comment: Pt demonstrated safe ability to STS from EOB. she did nto require lifting assistance.  Ambulation/Gait Ambulation/Gait assistance: Supervision   Assistive device: Rolling walker (2 wheeled) Gait Pattern/deviations: WFL(Within Functional Limits) Gait velocity: WNL   General Gait Details: Pt was able to ambulate a lap and half without LOB or unsteadiness.   Stairs   Stairs assistance: Min guard Stair Management: Two rails   General stair comments: Pt was able to ascend/descend 4 stairs with step through pattern without difficulty.    Wheelchair Mobility    Modified Rankin (Stroke Patients Only)       Balance                                            Cognition Arousal/Alertness: Awake/alert Behavior During Therapy: WFL for tasks assessed/performed Overall Cognitive Status: Within Functional Limits for tasks assessed                                 General Comments: pt is A and O x 4. Very pleasnat and cooperative throughout      Exercises      General Comments        Pertinent Vitals/Pain Faces Pain Scale: Hurts a little bit Pain Location: back Pain Descriptors / Indicators: Grimacing;Moaning;Aching    Home Living  Prior Function            PT Goals (current goals can now be found in the care plan section) Acute Rehab PT Goals Patient Stated Goal: "to do things for myself"    Frequency    7X/week      PT Plan Current plan remains appropriate    Co-evaluation              AM-PAC PT "6 Clicks" Mobility   Outcome Measure  Help needed turning from your back to your side while in a flat bed without using bedrails?: None Help needed moving from lying on your back to sitting on the side of a flat bed without using bedrails?:  None Help needed moving to and from a bed to a chair (including a wheelchair)?: None Help needed standing up from a chair using your arms (e.g., wheelchair or bedside chair)?: A Little Help needed to walk in hospital room?: A Little Help needed climbing 3-5 steps with a railing? : A Little 6 Click Score: 21    End of Session Equipment Utilized During Treatment: Gait belt Activity Tolerance: Patient tolerated treatment well Patient left: in bed;with call bell/phone within reach;with bed alarm set Nurse Communication: Mobility status PT Visit Diagnosis: Muscle weakness (generalized) (M62.81);Difficulty in walking, not elsewhere classified (R26.2);Pain     Time:  -     Charges:                       Julaine Fusi PTA 11/06/19, 1:55 PM

## 2019-11-07 LAB — GLUCOSE, CAPILLARY
Glucose-Capillary: 267 mg/dL — ABNORMAL HIGH (ref 70–99)
Glucose-Capillary: 144 mg/dL — ABNORMAL HIGH (ref 70–99)
Glucose-Capillary: 192 mg/dL — ABNORMAL HIGH (ref 70–99)

## 2019-11-07 MED ORDER — DIAZEPAM 5 MG PO TABS: 5.0000 mg | ORAL_TABLET | Freq: Once | ORAL | Status: DC | PRN

## 2019-11-07 NOTE — Progress Notes (Signed)
Procedure: T11-L3 posterior fusion, T12-L2 posterior column osteotomies Procedure date: 11/03/2019 Diagnosis: thoracic/lumbar radiculopathy   History: Victoria Austin is s/p T11-L3 posterior fusion, T12-L2 posterior column osteotomies  POD4: She is doing well. Has BM yesterday and feels much better. Drain output yesterday 140. From 630am to 1230pm drain ouput 90.   POD3: Pain continues to improve. Ambulated with PT but finding it very difficult to log roll in the bed without assistance. Complains of abdominal pain and inability have BM. Catheter in place.  Drain output total yesterday 270  POD 2: Continues to do well.  Pain currently rated 5/10.  She is still very limited on ability to move around in the bed without assistance (sitting up from laying position).  Able to eat without issue.  Foley catheter remains continued. Drain output total yesterday 430.  POD1: Recovering well. Pain seems well controlled on current pain regimen. No new upper or lower extremity complaints. Drain outputs:  JP: 25 Hemovac: 350  POD0: Tolerated procedure well. Evaluated in post op recovery still disoriented from anesthesia but able to answer questions and obey commands.   Physical Exam: Vitals:   11/07/19 0002 11/07/19 0908  BP: (!) 130/53 (!) 151/71  Pulse: 80 81  Resp: 18 19  Temp: 98.6 F (37 C) 97.9 F (36.6 C)  SpO2: 95% 99%    Strength: 5/5 throughout upper and lower extremities Sensation: Intact and symmetric throughout upper and lower extremities.  Skin: Dressing intact, clean, dry  Data:  Recent Labs  Lab 11/05/19 1105  NA 134*  K 3.4*  CL 106  CO2 21*  BUN 20  CREATININE 0.90  GLUCOSE 228*  CALCIUM 7.9*   No results for input(s): AST, ALT, ALKPHOS in the last 168 hours.  Invalid input(s): TBILI   No results for input(s): WBC, HGB, HCT, PLT in the last 168 hours. No results for input(s): APTT, INR in the last 168 hours.       Other tests/results:  EXAM: LUMBAR SPINE -  2-3 VIEW 11/04/2019  COMPARISON:  Intraoperative fluoroscopic study dated 11/03/2019.  FINDINGS: L1 compression fracture with anterior wedging. There has been placement of posterior spinal fusion hardware at T11-L3. The hardware appears intact as visualized. Drainage tube and midline vertical lower back cutaneous surgical clips. Atherosclerotic calcification of the aorta.  IMPRESSION: Status post posterior spinal fusion at T11-L3.  Assessment/Plan:  Victoria Austin is POD2 s/p T11-L3 posterior fusion, T12-L2 posterior column osteotomies . Will continue to monitor  - monitor drain output - mobilize - pain control - increased methocarbamol to 1000, added celebrex. She does not want to take narcotic medications.  - DVT prophylaxis - PTOT - Brace - received - Imaging -completed - catheter care  - maintain until discharge due to baseline incontinence.  -Facility placement - bed secured at [facility]. D/c when drain output has minimized   Marin Olp PA-C Department of Neurosurgery

## 2019-11-07 NOTE — Progress Notes (Signed)
Patient c/o muscle spasm pain in right hip -states she felt it during PT and after seeing the PA, but did not report it.  Call placed to Dr. Izora Ribas, new order for a valium dose.  Before the call ended, patient reported she was able to alleviate her discomfort by exercising her leg.

## 2019-11-07 NOTE — Plan of Care (Signed)
  Problem: Education: Goal: Knowledge of General Education information will improve Description: Including pain rating scale, medication(s)/side effects and non-pharmacologic comfort measures Outcome: Progressing   Problem: Health Behavior/Discharge Planning: Goal: Ability to manage health-related needs will improve Outcome: Progressing   Problem: Clinical Measurements: Goal: Ability to maintain clinical measurements within normal limits will improve Outcome: Progressing Goal: Will remain free from infection Outcome: Progressing Goal: Diagnostic test results will improve Outcome: Progressing Goal: Respiratory complications will improve Outcome: Progressing Goal: Cardiovascular complication will be avoided Outcome: Progressing   Problem: Activity: Goal: Risk for activity intolerance will decrease Outcome: Progressing   Problem: Nutrition: Goal: Adequate nutrition will be maintained Outcome: Progressing   Problem: Elimination: Goal: Will not experience complications related to bowel motility Outcome: Progressing   Problem: Pain Managment: Goal: General experience of comfort will improve Outcome: Progressing

## 2019-11-07 NOTE — Progress Notes (Signed)
Physical Therapy Treatment Patient Details Name: Victoria Austin MRN: [id number] DOB: 02/25/1951 Today's Date: 11/07/2019    History of Present Illness Victoria Austin is s/p lumbar fusion (T11-L3 posterior fusion, T12-L2 posterior column osteotomies), POD #1.  Pt's PMH includes lumbar decompression in 2019, depression, Hodgkin's lymphoma, HTN, RA, stroke, DM2, and Bell's palsy.    PT Comments    Out of bed and competed 1 /12 laps and stair training with back brace donned, RW and min guard/supervision.  She does c/o some increased L hip pain today and has one incident where she has a slight buckle L which she attributes to cramping but she is able to recover and control without assist.  Verbal cues for sequencing on stairs to lead R when going up in a step-to pattern verses step over step.     Follow Up Recommendations  Home health PT;Supervision for mobility/OOB     Equipment Recommendations  Rolling walker with 5" wheels;3in1 (PT)    Recommendations for Other Services       Precautions / Restrictions Precautions Precautions: Back;Fall Precaution Booklet Issued: No Precaution Comments: no bending, lifting, twisting Required Braces or Orthoses: Spinal Brace Spinal Brace: Applied in sitting position Restrictions Weight Bearing Restrictions: No    Mobility  Bed Mobility Overal bed mobility: Needs Assistance Bed Mobility: Supine to Sit     Supine to sit: Supervision        Transfers Overall transfer level: Needs assistance Equipment used: Rolling walker (2 wheeled) Transfers: Sit to/from Stand Sit to Stand: Supervision;Min guard            Ambulation/Gait Ambulation/Gait assistance: Supervision;Min guard Gait Distance (Feet): 250 Feet Assistive device: Rolling walker (2 wheeled) Gait Pattern/deviations: Step-through pattern;Decreased step length - right;Decreased step length - left Gait velocity: WNL   General Gait Details: Pt was able to ambulate a lap and half  and complete stair training.  One L LE slight buckle she attributed to cramping but is able to recover without assist.   Stairs Stairs: Yes Stairs assistance: Min guard Stair Management: Two rails;Step to pattern Number of Stairs: 4 General stair comments: Pt was able to ascend/descend 4 stairs with step through pattern without difficulty but does have some increased pain/feelign of weakness L hip.   Wheelchair Mobility    Modified Rankin (Stroke Patients Only)       Balance Overall balance assessment: Needs assistance Sitting-balance support: No upper extremity supported;Feet supported Sitting balance-Leahy Scale: Good     Standing balance support: Bilateral upper extremity supported Standing balance-Leahy Scale: Fair                              Cognition Arousal/Alertness: Awake/alert Behavior During Therapy: WFL for tasks assessed/performed Overall Cognitive Status: Within Functional Limits for tasks assessed                                 General Comments: pt is A and O x 4. Very pleasnat and cooperative throughout      Exercises      General Comments        Pertinent Vitals/Pain Pain Assessment: Faces Faces Pain Scale: Hurts even more Pain Location: L hip Pain Descriptors / Indicators: Grimacing;Moaning;Aching Pain Intervention(s): Limited activity within patient's tolerance;Monitored during session    Home Living  Prior Function            PT Goals (current goals can now be found in the care plan section) Progress towards PT goals: Progressing toward goals    Frequency    7X/week      PT Plan Current plan remains appropriate    Co-evaluation              AM-PAC PT "6 Clicks" Mobility   Outcome Measure  Help needed turning from your back to your side while in a flat bed without using bedrails?: None Help needed moving from lying on your back to sitting on the side of a flat  bed without using bedrails?: None Help needed moving to and from a bed to a chair (including a wheelchair)?: None Help needed standing up from a chair using your arms (e.g., wheelchair or bedside chair)?: A Little Help needed to walk in hospital room?: A Little Help needed climbing 3-5 steps with a railing? : A Little 6 Click Score: 21    End of Session Equipment Utilized During Treatment: Gait belt Activity Tolerance: Patient tolerated treatment well Patient left: with call bell/phone within reach;in chair Nurse Communication: Mobility status       Time: [id number] PT Time Calculation (min) (ACUTE ONLY): 23 min  Charges:  $Gait Training: 23-37 mins                    Chesley Noon, PTA 11/07/19, 9:17 AM

## 2019-11-08 DIAGNOSIS — Z1159 Encounter for screening for other viral diseases: Secondary | ICD-10-CM | POA: Diagnosis not present

## 2019-11-08 DIAGNOSIS — F445 Conversion disorder with seizures or convulsions: Secondary | ICD-10-CM | POA: Diagnosis not present

## 2019-11-08 DIAGNOSIS — R52 Pain, unspecified: Secondary | ICD-10-CM | POA: Diagnosis not present

## 2019-11-08 DIAGNOSIS — E1165 Type 2 diabetes mellitus with hyperglycemia: Secondary | ICD-10-CM | POA: Diagnosis not present

## 2019-11-08 DIAGNOSIS — N3289 Other specified disorders of bladder: Secondary | ICD-10-CM | POA: Diagnosis not present

## 2019-11-08 DIAGNOSIS — M5136 Other intervertebral disc degeneration, lumbar region: Secondary | ICD-10-CM | POA: Diagnosis not present

## 2019-11-08 DIAGNOSIS — K5900 Constipation, unspecified: Secondary | ICD-10-CM | POA: Diagnosis not present

## 2019-11-08 DIAGNOSIS — M6281 Muscle weakness (generalized): Secondary | ICD-10-CM | POA: Diagnosis not present

## 2019-11-08 DIAGNOSIS — Z7401 Bed confinement status: Secondary | ICD-10-CM | POA: Diagnosis not present

## 2019-11-08 DIAGNOSIS — E785 Hyperlipidemia, unspecified: Secondary | ICD-10-CM | POA: Diagnosis not present

## 2019-11-08 DIAGNOSIS — S32010G Wedge compression fracture of first lumbar vertebra, subsequent encounter for fracture with delayed healing: Secondary | ICD-10-CM | POA: Diagnosis not present

## 2019-11-08 DIAGNOSIS — M419 Scoliosis, unspecified: Secondary | ICD-10-CM | POA: Diagnosis not present

## 2019-11-08 DIAGNOSIS — F319 Bipolar disorder, unspecified: Secondary | ICD-10-CM | POA: Diagnosis not present

## 2019-11-08 DIAGNOSIS — J45901 Unspecified asthma with (acute) exacerbation: Secondary | ICD-10-CM | POA: Diagnosis not present

## 2019-11-08 DIAGNOSIS — K219 Gastro-esophageal reflux disease without esophagitis: Secondary | ICD-10-CM | POA: Diagnosis not present

## 2019-11-08 DIAGNOSIS — F3289 Other specified depressive episodes: Secondary | ICD-10-CM | POA: Diagnosis not present

## 2019-11-08 DIAGNOSIS — I10 Essential (primary) hypertension: Secondary | ICD-10-CM | POA: Diagnosis not present

## 2019-11-08 DIAGNOSIS — E119 Type 2 diabetes mellitus without complications: Secondary | ICD-10-CM | POA: Diagnosis not present

## 2019-11-08 DIAGNOSIS — M048 Other autoinflammatory syndromes: Secondary | ICD-10-CM | POA: Diagnosis not present

## 2019-11-08 DIAGNOSIS — M2550 Pain in unspecified joint: Secondary | ICD-10-CM | POA: Diagnosis not present

## 2019-11-08 DIAGNOSIS — F064 Anxiety disorder due to known physiological condition: Secondary | ICD-10-CM | POA: Diagnosis not present

## 2019-11-08 DIAGNOSIS — E569 Vitamin deficiency, unspecified: Secondary | ICD-10-CM | POA: Diagnosis not present

## 2019-11-08 DIAGNOSIS — J302 Other seasonal allergic rhinitis: Secondary | ICD-10-CM | POA: Diagnosis not present

## 2019-11-08 DIAGNOSIS — M5416 Radiculopathy, lumbar region: Secondary | ICD-10-CM | POA: Diagnosis not present

## 2019-11-08 LAB — RESPIRATORY PANEL BY RT PCR (FLU A&B, COVID)
Influenza A by PCR: NEGATIVE
Influenza B by PCR: NEGATIVE
SARS Coronavirus 2 by RT PCR: NEGATIVE

## 2019-11-08 LAB — GLUCOSE, CAPILLARY
Glucose-Capillary: 173 mg/dL — ABNORMAL HIGH (ref 70–99)
Glucose-Capillary: 106 mg/dL — ABNORMAL HIGH (ref 70–99)
Glucose-Capillary: 131 mg/dL — ABNORMAL HIGH (ref 70–99)
Glucose-Capillary: 144 mg/dL — ABNORMAL HIGH (ref 70–99)
Glucose-Capillary: 92 mg/dL (ref 70–99)
Glucose-Capillary: 226 mg/dL — ABNORMAL HIGH (ref 70–99)

## 2019-11-08 MED ORDER — OXYCODONE HCL 5 MG PO TABS: 5.0000 mg | ORAL_TABLET | ORAL | 0 refills | Status: DC | PRN

## 2019-11-08 MED ORDER — CELECOXIB 100 MG PO CAPS: 100.0000 mg | ORAL_CAPSULE | Freq: Two times a day (BID) | ORAL | 0 refills | Status: DC

## 2019-11-08 MED ORDER — METHOCARBAMOL 500 MG PO TABS: 1000.0000 mg | ORAL_TABLET | Freq: Four times a day (QID) | ORAL | 0 refills | Status: DC

## 2019-11-08 MED ORDER — ACETAMINOPHEN 325 MG PO TABS: 650.0000 mg | ORAL_TABLET | ORAL | 0 refills | Status: DC | PRN

## 2019-11-08 MED ORDER — SENNA 8.6 MG PO TABS: 1.0000 | ORAL_TABLET | Freq: Two times a day (BID) | ORAL | 0 refills | Status: DC

## 2019-11-08 NOTE — Care Management Important Message (Signed)
Important Message  Patient Details  Name: Victoria Austin MRN: [id number] Date of Birth: 16-Feb-1951   Medicare Important Message Given:  Yes     Juliann Pulse A Vernie Vinciguerra 11/08/2019, 10:58 AM

## 2019-11-08 NOTE — TOC Transition Note (Addendum)
Transition of Care [facility]) - CM/SW Discharge Note   Patient Details  Name: Chasidee Welsh MRN: [id number] Date of Birth: 1951/05/19  [facility] CM/SW Contact:  Su Hilt, RN Phone Number: 11/08/2019, 2:21 PM   Clinical Narrative:    Patient to DC to [facility] room 209, I called the daughter Vito Backers and notified her and provided her with [facility] phone number, The bedside nurse to call report to [facility], RNCM called EMS for transport, the Bedside nurse is aware   Final next level of care: Skilled Nursing Facility Barriers to Discharge: Barriers Resolved   Patient Goals and CMS Choice        Discharge Placement              Patient chooses bed at: [facility] [place] Patient to be transferred to facility by: EMS Name of family member notified: Cassandra Patient and family notified of of transfer: 11/08/19  Discharge Plan and Services                                     Social Determinants of Health (SDOH) Interventions     Readmission Risk Interventions No flowsheet data found.

## 2019-11-08 NOTE — Discharge Instructions (Signed)
Your surgeon has performed an operation on your lumbar spine (low back) to fuse two or more of the vertebrae (bones) together. This procedure is performed to treat a number of different spinal problems, including narrowing of the spinal canal (stenosis), herniated discs, degenerative changes, and injuries.   Many times, patients feel better immediately after surgery and can "overdo it." Even if you feel well, it is important that you follow these activity guidelines. If you do not let your back heal properly from the surgery, you can increase the chance of return of your symptoms and other complications. The following are instructions to help in your recovery once you have been discharged from the hospital.   * Do not take anti-inflammatory medications for 3 months after surgery (naproxen [Aleve], ibuprofen [Advil, Motrin], etc.). These medications can prevent your bones from healing properly.  * Discontinue plavix for 14 days following surgery * You may resume aspirin after 7 days following surgery  Activity     No bending, lifting, or twisting ("BLT"). Avoid lifting objects heavier than 10 pounds (gallon milk jug).  Where possible, avoid household activities that involve lifting, bending, reaching, pushing, or pulling such as laundry, vacuuming, grocery shopping, and childcare. Try to arrange for help from friends and family for these activities while your back heals.   Increase physical activity slowly as tolerated.  Taking short walks is encouraged, but avoid strenuous exercise. Do not jog, run, bicycle, lift weights, or participate in any other exercises unless specifically allowed by your doctor. Avoid prolonged sitting, including car rides.   Talk to your doctor before resuming sexual activity.   You should not drive until cleared by your doctor.   Until released by your doctor, you should not return to work or school.  You should rest at home and let your body heal.   You may shower three  days after your surgery.  After showering, lightly dab your incision dry. Do not take a tub bath or go swimming until approved by your doctor at your follow-up appointment.   If your doctor ordered a lumbar brace for you, you should wear it whenever you are out of bed. You may remove it when lying down or sleeping. You should also wear it when riding in a car. Not all back surgeries require a lumbar brace.   If you smoke, we strongly recommend that you quit.  Smoking has been proven to interfere with normal bone healing and will dramatically reduce the success rate of your surgery. Please contact [facility] (800-QUIT-NOW) and use the resources at www.[facility].com for assistance in stopping smoking.   Surgical Incision   If you have a dressing on your incision, remove it 2 days after your surgery. Keep your incision area clean and dry.   If you have staples or stitches on your incision, you should have a follow up scheduled for removal. If you do not have staples or stitches, you will have steri-strips (small pieces of surgical tape) or Dermabond glue. The steri-strips/glue should begin to peel away within about a week (it is fine if the steri-strips fall off before then). If the strips are still in place one week after your surgery, you may gently remove them.   Diet           You may return to your usual diet. Be sure to stay hydrated.   When to Contact us   Although your surgery and recovery will likely be uneventful, you may have some residual numbness,  aches, and pains in your back and/or legs. This is normal and should improve in the next few weeks.   However, should you experience any of the following, contact us immediately:   - New numbness or weakness   - Pain that is progressively getting worse, and is not relieved by your pain medications or rest   - Bleeding, redness, swelling, pain, or drainage from surgical incision   - Chills or flu-like symptoms   - Fever greater than 101.0  F (38.3 C)   - Problems with bowel or bladder functions   - Difficulty breathing or shortness of breath   - Warmth, tenderness, or swelling in your calf   Contact Information   - During office hours (Monday-Friday 9 am to 5 pm), please call your physician at [phone]  - After hours and weekends, please call [phone] and an answering service will put you in touch with either Dr. Lacinda Axon or Dr. Izora Ribas.   - For a life-threatening emergency, call 911

## 2019-11-08 NOTE — Discharge Summary (Signed)
Procedure: T11-L3 posterior fusion, T12-L2 posterior column osteotomies Procedure date: 11/03/2019 Diagnosis: thoracic/lumbar radiculopathy   History: Victoria Austin is s/p T11-L3 posterior fusion, T12-L2 posterior column osteotomies  POD5: She is doing very well. No new complaints.   POD4: She is doing well. Has BM yesterday and feels much better. Drain output yesterday 140. From 630am to 1230pm drain ouput 90.   POD3: Pain continues to improve. Ambulated with PT but finding it very difficult to log roll in the bed without assistance. Complains of abdominal pain and inability have BM. Catheter in place.  Drain output total yesterday 270  POD 2: Continues to do well.  Pain currently rated 5/10.  She is still very limited on ability to move around in the bed without assistance (sitting up from laying position).  Able to eat without issue.  Foley catheter remains continued. Drain output total yesterday 430.  POD1: Recovering well. Pain seems well controlled on current pain regimen. No new upper or lower extremity complaints. Drain outputs:  JP: 25 Hemovac: 350  POD0: Tolerated procedure well. Evaluated in post op recovery still disoriented from anesthesia but able to answer questions and obey commands.   Physical Exam: Vitals:   11/07/19 2344 11/08/19 0833  BP: (!) 139/58 (!) 154/60  Pulse: 76 77  Resp: 18 16  Temp: 98.4 F (36.9 C) 97.6 F (36.4 C)  SpO2: 100% 100%    Strength: 5/5 throughout upper and lower extremities Sensation: Intact and symmetric throughout upper and lower extremities.  Skin: Dressing intact, clean, dry: dressing changed to light gauze dressing. Staples intact.  no bleeding noted at drain insertion sites.    Meds:  Allergies as of 11/08/2019      Reactions   Fire Ant Anaphylaxis   BEE STINGS ALSO   Ciprofloxacin Nausea And Vomiting   Cyclobenzaprine Nausea And Vomiting   Diazepam Nausea And Vomiting   Ketorolac    HALLUCINATIONS   Ondansetron  Nausea And Vomiting   ABRUPT VOMITTING   Ranitidine Hcl    FEELS LIKE BUGS CRAWLING ALL OVER HER   Ambien [zolpidem] Other (See Comments)   Severely sedating   Bacitracin-polymyxin B    BURNS SKIN   Mushroom Extract Complex    BELL PEPPERS TOO CAUSING ITCHING   Neomycin-polymyxin-gramicidin Rash   BURNS SKIN      Medication List    STOP taking these medications   clopidogrel 75 MG tablet Commonly known as: PLAVIX     TAKE these medications   acetaminophen 325 MG tablet Commonly known as: TYLENOL Take 2 tablets (650 mg total) by mouth every 4 (four) hours as needed for mild pain ((score 1 to 3) or temp > 100.5).   Advair HFA 230-21 MCG/ACT inhaler Generic drug: fluticasone-salmeterol Inhale 1 puff into the lungs 2 (two) times daily. What changed:   when to take this  reasons to take this   albuterol 108 (90 Base) MCG/ACT inhaler Commonly known as: ProAir HFA Inhale 1 puff into the lungs 2 (two) times daily as needed.   alendronate 70 MG tablet Commonly known as: FOSAMAX Take 70 mg by mouth once a week.   carbamazepine 200 MG tablet Commonly known as: TEGRETOL Take 200 mg by mouth 3 (three) times daily.   celecoxib 100 MG capsule Commonly known as: CELEBREX Take 1 capsule (100 mg total) by mouth 2 (two) times daily.   cloNIDine 0.1 MG tablet Commonly known as: CATAPRES Take 1 tablet (0.1 mg total) by mouth daily as needed (  blood pressure >180/100).   cyanocobalamin 1000 MCG/ML injection Commonly known as: (VITAMIN B-12) Inject 1,000 mcg into the muscle every 30 (thirty) days.   docusate sodium 250 MG capsule Commonly known as: COLACE Take 250 mg by mouth daily.   DULoxetine 60 MG capsule Commonly known as: CYMBALTA Take 60 mg by mouth daily.   EPINEPHrine 0.3 mg/0.3 mL Soaj injection Commonly known as: EpiPen 2-Pak Inject 0.3 mLs (0.3 mg total) into the muscle as needed for anaphylaxis. Then call 911 for ER transport.   FreeStyle Libre 14 Day  Reader Kerrin Mo 1 Device by Does not apply route 4 (four) times daily.   FreeStyle Libre 14 Day Sensor Misc 1 Device by Does not apply route every 14 (fourteen) days.   gabapentin 100 MG capsule Commonly known as: NEURONTIN Take 300 mg PO at bedtime x 7d, then 200 mg PO at bedtime x 7 d, then 100 mg po at bedtime x 7 d What changed:   how much to take  how to take this  when to take this  additional instructions   hydrALAZINE 50 MG tablet Commonly known as: APRESOLINE Take 1 tablet (50 mg total) by mouth 3 (three) times daily.   icosapent Ethyl 1 g capsule Commonly known as: VASCEPA Take 2 capsules (2 g total) by mouth 2 (two) times daily.   Jardiance 10 MG Tabs tablet Generic drug: empagliflozin Take 10 mg by mouth daily.   Lantus SoloStar 100 UNIT/ML Solostar Pen Generic drug: insulin glargine Inject 85 Units into the skin daily. What changed: when to take this   lisinopril 5 MG tablet Commonly known as: ZESTRIL Take 1 tablet (5 mg total) by mouth daily.   loratadine 10 MG tablet Commonly known as: CLARITIN Take 1 tablet (10 mg total) by mouth daily.   LORazepam 1 MG tablet Commonly known as: ATIVAN Take 1 mg by mouth daily as needed for anxiety or sleep.   MAGnesium-Oxide 400 (241.3 Mg) MG tablet Generic drug: magnesium oxide Take 1 tablet (400 mg total) by mouth daily.   meclizine 25 MG tablet Commonly known as: ANTIVERT Take 1 tablet (25 mg total) by mouth 3 (three) times daily as needed for dizziness. What changed:   how much to take  when to take this  additional instructions   methocarbamol 500 MG tablet Commonly known as: ROBAXIN Take 2 tablets (1,000 mg total) by mouth every 6 (six) hours. What changed:   medication strength  how much to take  when to take this  reasons to take this   mirabegron ER 50 MG Tb24 tablet Commonly known as: MYRBETRIQ Take 1 tablet (50 mg total) by mouth daily. What changed: how much to take    montelukast 10 MG tablet Commonly known as: SINGULAIR Take 1 tablet (10 mg total) by mouth daily.   NIFEdipine 60 MG 24 hr tablet Commonly known as: ADALAT CC Take 1 tablet (60 mg total) by mouth 2 (two) times daily.   NovoLOG FlexPen 100 UNIT/ML FlexPen Generic drug: insulin aspart Inject 22-34 Units into the skin 3 (three) times daily. With meals   oxyCODONE 5 MG immediate release tablet Commonly known as: Oxy IR/ROXICODONE Take 1 tablet (5 mg total) by mouth every 4 (four) hours as needed for breakthrough pain ((score 4 to 6)).   pantoprazole 40 MG tablet Commonly known as: PROTONIX Take 1 tablet (40 mg total) by mouth daily.   primidone 50 MG tablet Commonly known as: MYSOLINE Take 4 tablets (200 mg  total) by mouth at bedtime.   promethazine 25 MG tablet Commonly known as: PHENERGAN Take 1 tablet (25 mg total) by mouth every 6 (six) hours as needed.   propranolol 10 MG tablet Commonly known as: INDERAL Take 1 tablet (10 mg total) by mouth 2 (two) times daily. What changed: when to take this   rosuvastatin 20 MG tablet Commonly known as: Crestor Take 1 tablet (20 mg total) by mouth daily.   senna 8.6 MG Tabs tablet Commonly known as: SENOKOT Take 1 tablet (8.6 mg total) by mouth 2 (two) times daily.   Sure Comfort Pen Needles 31G X 5 MM Misc Generic drug: Insulin Pen Needle Inject 1 pen as directed 5 (five) times daily as needed.   trimethoprim 100 MG tablet Commonly known as: TRIMPEX Take 1 tablet (100 mg total) by mouth daily.   Vitamin D (Ergocalciferol) 1.25 MG (50000 UNIT) Caps capsule Commonly known as: DRISDOL Take 1 capsule (50,000 Units total) by mouth once a week.            Durable Medical Equipment  (From admission, onward)         Start     Ordered   11/08/19 1019  DME 3-in-1  Once     11/08/19 1020   11/08/19 0000  For home use only DME 4 wheeled rolling walker with seat    Question:  Patient needs a walker to treat with the  following condition  Answer:  S/P lumbar fusion   11/08/19 1020         Data:  Recent Labs  Lab 11/05/19 1105  NA 134*  K 3.4*  CL 106  CO2 21*  BUN 20  CREATININE 0.90  GLUCOSE 228*  CALCIUM 7.9*   No results for input(s): AST, ALT, ALKPHOS in the last 168 hours.  Invalid input(s): TBILI   No results for input(s): WBC, HGB, HCT, PLT in the last 168 hours. No results for input(s): APTT, INR in the last 168 hours.       Other tests/results:  EXAM: LUMBAR SPINE - 2-3 VIEW 11/04/2019  COMPARISON:  Intraoperative fluoroscopic study dated 11/03/2019.  FINDINGS: L1 compression fracture with anterior wedging. There has been placement of posterior spinal fusion hardware at T11-L3. The hardware appears intact as visualized. Drainage tube and midline vertical lower back cutaneous surgical clips. Atherosclerotic calcification of the aorta.  IMPRESSION: Status post posterior spinal fusion at T11-L3.  Assessment/Plan:  Loretto Villari is POD5 s/p T11-L3 posterior fusion, T12-L2 posterior column osteotomies. Drain output over 12 hours yesterday was 40. Drain removed without issue and insertion sites dressed. She is recovering well and wil be discharged to facility. She is scheduled to follow up in clinic in approximately 2 weeks to monitor progress.   Marin Olp PA-C Department of Neurosurgery

## 2019-11-08 NOTE — Progress Notes (Signed)
Report called to [facility] [place], [place]

## 2019-11-09 ENCOUNTER — Encounter: Payer: Self-pay | Admitting: Family Medicine

## 2019-11-10 DIAGNOSIS — K219 Gastro-esophageal reflux disease without esophagitis: Secondary | ICD-10-CM | POA: Diagnosis not present

## 2019-11-10 DIAGNOSIS — J45901 Unspecified asthma with (acute) exacerbation: Secondary | ICD-10-CM | POA: Diagnosis not present

## 2019-11-10 DIAGNOSIS — K5900 Constipation, unspecified: Secondary | ICD-10-CM | POA: Diagnosis not present

## 2019-11-10 DIAGNOSIS — M419 Scoliosis, unspecified: Secondary | ICD-10-CM | POA: Diagnosis not present

## 2019-11-10 DIAGNOSIS — E119 Type 2 diabetes mellitus without complications: Secondary | ICD-10-CM | POA: Diagnosis not present

## 2019-11-10 DIAGNOSIS — I10 Essential (primary) hypertension: Secondary | ICD-10-CM | POA: Diagnosis not present

## 2019-11-10 DIAGNOSIS — S32010G Wedge compression fracture of first lumbar vertebra, subsequent encounter for fracture with delayed healing: Secondary | ICD-10-CM | POA: Diagnosis not present

## 2019-11-15 ENCOUNTER — Other Ambulatory Visit: Payer: Self-pay

## 2019-11-15 ENCOUNTER — Other Ambulatory Visit: Payer: Self-pay | Admitting: *Deleted

## 2019-11-15 NOTE — Patient Outreach (Signed)
Member screened for potential [facility] Care Management needs as a benefit of [employer].  Verified in Patient [place] that Ms. Law discharged from [facility] on 11/11/19.   Telephone call made to Ms. Bramhall at [phone]. Patient identifiers confirmed.   Ms. Grella endorses that she is doing really well. States she did not need home health because she is doing so well. States she lives with her daughter and 65yo grandson. She denies having any [facility] Care Management needs for chronic disease management, transportation, meals, or medication management.   Expressed appreciation of the call. Writer to email [facility] contact information and 24-hr nurse advice line number to contact in future if needed.    Marthenia Rolling, MSN-Ed, RN,BSN [place] Acute Care Coordinator [phone] [facility]) [phone]  (Toll free office)

## 2019-11-16 DIAGNOSIS — F411 Generalized anxiety disorder: Secondary | ICD-10-CM | POA: Diagnosis not present

## 2019-11-16 DIAGNOSIS — F3132 Bipolar disorder, current episode depressed, moderate: Secondary | ICD-10-CM | POA: Diagnosis not present

## 2019-11-18 ENCOUNTER — Encounter: Payer: Self-pay | Admitting: Family Medicine

## 2019-11-22 ENCOUNTER — Encounter: Payer: Self-pay | Admitting: Family Medicine

## 2019-11-23 ENCOUNTER — Inpatient Hospital Stay: Payer: Medicare Other | Admitting: Family Medicine

## 2019-11-25 DIAGNOSIS — M4184 Other forms of scoliosis, thoracic region: Secondary | ICD-10-CM | POA: Diagnosis not present

## 2019-11-25 DIAGNOSIS — Z981 Arthrodesis status: Secondary | ICD-10-CM | POA: Diagnosis not present

## 2019-11-25 DIAGNOSIS — M4326 Fusion of spine, lumbar region: Secondary | ICD-10-CM | POA: Diagnosis not present

## 2019-11-25 DIAGNOSIS — M4186 Other forms of scoliosis, lumbar region: Secondary | ICD-10-CM | POA: Diagnosis not present

## 2019-11-30 DIAGNOSIS — F411 Generalized anxiety disorder: Secondary | ICD-10-CM | POA: Diagnosis not present

## 2019-11-30 DIAGNOSIS — F3132 Bipolar disorder, current episode depressed, moderate: Secondary | ICD-10-CM | POA: Diagnosis not present

## 2019-12-07 ENCOUNTER — Encounter: Payer: Self-pay | Admitting: Family Medicine

## 2019-12-08 ENCOUNTER — Inpatient Hospital Stay: Payer: Medicare Other | Admitting: Family Medicine

## 2019-12-08 DIAGNOSIS — F3132 Bipolar disorder, current episode depressed, moderate: Secondary | ICD-10-CM | POA: Diagnosis not present

## 2019-12-08 DIAGNOSIS — F411 Generalized anxiety disorder: Secondary | ICD-10-CM | POA: Diagnosis not present

## 2019-12-10 ENCOUNTER — Other Ambulatory Visit: Payer: Self-pay

## 2019-12-10 ENCOUNTER — Ambulatory Visit (INDEPENDENT_AMBULATORY_CARE_PROVIDER_SITE_OTHER): Payer: Medicare Other | Admitting: Family Medicine

## 2019-12-10 ENCOUNTER — Encounter: Payer: Self-pay | Admitting: Family Medicine

## 2019-12-10 VITALS — BP 140/70 | HR 84 | Temp 96.9°F | Resp 16 | Ht 64.0 in | Wt 191.7 lb

## 2019-12-10 DIAGNOSIS — F319 Bipolar disorder, unspecified: Secondary | ICD-10-CM

## 2019-12-10 DIAGNOSIS — Z794 Long term (current) use of insulin: Secondary | ICD-10-CM | POA: Diagnosis not present

## 2019-12-10 DIAGNOSIS — E1129 Type 2 diabetes mellitus with other diabetic kidney complication: Secondary | ICD-10-CM | POA: Diagnosis not present

## 2019-12-10 DIAGNOSIS — R809 Proteinuria, unspecified: Secondary | ICD-10-CM | POA: Diagnosis not present

## 2019-12-10 DIAGNOSIS — I2510 Atherosclerotic heart disease of native coronary artery without angina pectoris: Secondary | ICD-10-CM

## 2019-12-10 DIAGNOSIS — E871 Hypo-osmolality and hyponatremia: Secondary | ICD-10-CM

## 2019-12-10 DIAGNOSIS — Z9889 Other specified postprocedural states: Secondary | ICD-10-CM

## 2019-12-10 DIAGNOSIS — D649 Anemia, unspecified: Secondary | ICD-10-CM

## 2019-12-10 DIAGNOSIS — M810 Age-related osteoporosis without current pathological fracture: Secondary | ICD-10-CM | POA: Diagnosis not present

## 2019-12-10 DIAGNOSIS — E538 Deficiency of other specified B group vitamins: Secondary | ICD-10-CM | POA: Diagnosis not present

## 2019-12-10 MED ORDER — CYANOCOBALAMIN 1000 MCG/ML IJ SOLN
1000.0000 ug | INTRAMUSCULAR | Status: AC
  Administered 2019-12-10 – 2020-02-02 (×2): 1000 ug via INTRAMUSCULAR

## 2019-12-10 NOTE — Progress Notes (Signed)
Name: Victoria Austin   MRN: [id number]    DOB: Jan 18, 1951   Date:12/12/2019       Progress Note  Subjective  Chief Complaint  Chief Complaint  Patient presents with  . Follow-up    Back Surgery and discharged form rehab    [facility] discharge follow up: patient was admitted on 11/03/2019 at [facility] and had T11-L3 posterior fusion and T12-L2 posterior column osteotomies done by Dr. Izora Ribas , she was transferred to [facility] 11/09/2019  for rehabilitation and she went hom eon 11/11/2019. She got a follow up visit from [facility] care management . She states she did not have home health care. She states she was supposed to have home health but it was never scheduled, she is now driving and no longer qualifies. She was sent home with a walker and uses it at home or if someone can place it in her trunk since currently she can only life 5 lbs. She stopped taking pain medication because it was causing constipation. She is only taking Tylenol prn . She states yesterday she had to drive to [facility] to get her medications, she sat in her car for over 6 hours and she states when she got out of the care her legs felt week. She rested with her legs elevated for about one hour. Advised to try to avoid staying on the same position for a long time   Patient Active Problem List   Diagnosis Date Noted  . S/P lumbar fusion 11/03/2019  . Hereditary essential tremor 10/08/2019  . Polypharmacy 10/08/2019  . OSA on CPAP 09/01/2019  . Anticoagulant long-term use 09/01/2019  . Lumbar degenerative disc disease 06/08/2019  . Lumbar facet arthropathy 06/08/2019  . Chronic pain syndrome 06/08/2019  . Capsulitis 05/20/2019  . Benign essential HTN 04/15/2019  . Constipation 04/13/2019  . Vertigo 04/13/2019  . Hypertriglyceridemia 04/13/2019  . 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 Diabetic neuropathy ([place]) 02/15/2019  . Cerebrovascular accident (CVA) ([place]) 01/08/2019  . Incontinence   . RA (rheumatoid arthritis) ([place])    . Temporal arteritis ([place]) 01/01/2019  . Goals of care, counseling/discussion 12/26/2018  . Albuminuria 12/18/2018  . Dyslipidemia associated with type 2 diabetes mellitus ([place]) 12/11/2018  . Compression fracture of L1 lumbar vertebra ([place]) 12/11/2018  . 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 Anxiety   . Depression   . Diverticulitis 05/05/2018  . Hypertensive disorder 05/05/2018  . Fibromyalgia 03/21/2016  . Irritable bowel syndrome 09/19/2014  . GERD (gastroesophageal reflux disease) 04/03/2011  . Hiatal hernia 01/12/2007  . Bipolar 1 disorder ([place]) 07/29/2004    Past Surgical History:  Procedure Laterality Date  . BACK SURGERY    . CESAREAN SECTION    . ELBOW ARTHROSCOPY    . FINGER SURGERY    . HAND SURGERY    . HERNIA REPAIR     UMBILICAL REPAIR  . LAMINECTOMY WITH POSTERIOR LATERAL ARTHRODESIS LEVEL 4 N/A 11/03/2019   Procedure: T11-L3 POSTERIOR FUSION, T12-L2 POSTERIOR COLUMN OSTEOTOMIES;  Surgeon: Meade Maw, MD;  Location: [facility] ORS;  Service: Neurosurgery;  Laterality: N/A;  . SPINE SURGERY      Family History  Problem Relation Age of Onset  . Lung cancer Mother   . Hypertension Mother   . Hypertension Sister   . Hypertension Brother   . Bone cancer Maternal Aunt   . Colon cancer Maternal Uncle     Social History   Tobacco Use  . Smoking  status: Never Smoker  . Smokeless tobacco: Never Used  Substance Use Topics  . Alcohol use: Never     Current Outpatient Medications:  .  acetaminophen (TYLENOL) 325 MG tablet, Take 2 tablets (650 mg total) by mouth every 4 (four) hours as needed for mild pain ((score 1 to 3) or temp > 100.5)., Disp: 60 tablet, Rfl: 0 .  ADVAIR HFA 230-21 MCG/ACT inhaler, Inhale 1 puff into the lungs 2 (two) times daily. (Patient taking differently: Inhale 1 puff into the lungs 2 (two) times daily as needed. ), Disp: 1 Inhaler, Rfl: 3 .  albuterol (PROAIR HFA) 108 (90 Base) MCG/ACT  inhaler, Inhale 1 puff into the lungs 2 (two) times daily as needed., Disp: 18 g, Rfl: 1 .  alendronate (FOSAMAX) 70 MG tablet, Take 70 mg by mouth once a week. , Disp: , Rfl:  .  carbamazepine (TEGRETOL) 200 MG tablet, Take 400 mg by mouth 3 (three) times daily. Brianne Klinger,OIC , Disp: , Rfl:  .  celecoxib (CELEBREX) 100 MG capsule, Take 1 capsule (100 mg total) by mouth 2 (two) times daily., Disp: 60 capsule, Rfl: 0 .  cloNIDine (CATAPRES) 0.1 MG tablet, Take 1 tablet (0.1 mg total) by mouth daily as needed (blood pressure >180/100)., Disp: 60 tablet, Rfl: 3 .  Continuous Blood Gluc Receiver (FREESTYLE LIBRE 14 DAY READER) DEVI, 1 Device by Does not apply route 4 (four) times daily., Disp: 1 Device, Rfl: 0 .  Continuous Blood Gluc Sensor (FREESTYLE LIBRE 14 DAY SENSOR) MISC, 1 Device by Does not apply route every 14 (fourteen) days., Disp: 6 each, Rfl: 1 .  cyanocobalamin (,VITAMIN B-12,) 1000 MCG/ML injection, Inject 1,000 mcg into the muscle every 30 (thirty) days. , Disp: , Rfl:  .  DULoxetine (CYMBALTA) 60 MG capsule, Take 60 mg by mouth daily. , Disp: , Rfl:  .  empagliflozin (JARDIANCE) 10 MG TABS tablet, Take 10 mg by mouth daily., Disp: , Rfl:  .  EPINEPHrine (EPIPEN 2-PAK) 0.3 mg/0.3 mL IJ SOAJ injection, Inject 0.3 mLs (0.3 mg total) into the muscle as needed for anaphylaxis. Then call 911 for ER transport., Disp: 0.3 mL, Rfl: 1 .  hydrALAZINE (APRESOLINE) 50 MG tablet, Take 1 tablet (50 mg total) by mouth 3 (three) times daily., Disp: 270 tablet, Rfl: 3 .  Icosapent Ethyl 1 g CAPS, Take 2 capsules (2 g total) by mouth 2 (two) times daily., Disp: 360 capsule, Rfl: 3 .  Insulin Glargine (LANTUS SOLOSTAR) 100 UNIT/ML Solostar Pen, Inject 85 Units into the skin daily. (Patient taking differently: Inject 85 Units into the skin at bedtime. ), Disp: 10 pen, Rfl: 3 .  lisinopril (ZESTRIL) 5 MG tablet, Take 1 tablet (5 mg total) by mouth daily., Disp: 90 tablet, Rfl: 3 .  loratadine (CLARITIN)  10 MG tablet, Take 1 tablet (10 mg total) by mouth daily., Disp: 90 tablet, Rfl: 1 .  LORazepam (ATIVAN) 1 MG tablet, Take 1 mg by mouth daily as needed for anxiety or sleep. , Disp: , Rfl:  .  meclizine (ANTIVERT) 25 MG tablet, Take 1 tablet (25 mg total) by mouth 3 (three) times daily as needed for dizziness. (Patient taking differently: Take 25-50 mg by mouth See admin instructions. Take 50 mg by mouth in the morning and 25 mg in the afternoon), Disp: 180 tablet, Rfl: 1 .  methocarbamol (ROBAXIN) 500 MG tablet, Take 2 tablets (1,000 mg total) by mouth every 6 (six) hours., Disp: 80 tablet, Rfl: 0 .  mirabegron ER (MYRBETRIQ) 50 MG TB24 tablet, Take 1 tablet (50 mg total) by mouth daily. (Patient taking differently: Take 100 mg by mouth daily. ), Disp: 90 tablet, Rfl: 3 .  montelukast (SINGULAIR) 10 MG tablet, Take 1 tablet (10 mg total) by mouth daily., Disp: 90 tablet, Rfl: 0 .  NIFEdipine (ADALAT CC) 60 MG 24 hr tablet, Take 1 tablet (60 mg total) by mouth 2 (two) times daily., Disp: 180 tablet, Rfl: 1 .  NOVOLOG FLEXPEN 100 UNIT/ML FlexPen, Inject 22-34 Units into the skin 3 (three) times daily. With meals, Disp: 3 pen, Rfl: 3 .  pantoprazole (PROTONIX) 40 MG tablet, Take 1 tablet (40 mg total) by mouth daily., Disp: 90 tablet, Rfl: 1 .  primidone (MYSOLINE) 50 MG tablet, Take 4 tablets (200 mg total) by mouth at bedtime., Disp: , Rfl:  .  promethazine (PHENERGAN) 25 MG tablet, Take 1 tablet (25 mg total) by mouth every 6 (six) hours as needed., Disp: 30 tablet, Rfl: 1 .  propranolol (INDERAL) 10 MG tablet, Take 1 tablet (10 mg total) by mouth 2 (two) times daily. (Patient taking differently: Take 10 mg by mouth daily. ), Disp: 180 tablet, Rfl: 1 .  rosuvastatin (CRESTOR) 20 MG tablet, Take 1 tablet (20 mg total) by mouth daily., Disp: 90 tablet, Rfl: 3 .  SURE COMFORT PEN NEEDLES 31G X 5 MM MISC, Inject 1 pen as directed 5 (five) times daily as needed., Disp: 15 each, Rfl: 0 .  trimethoprim  (TRIMPEX) 100 MG tablet, Take 1 tablet (100 mg total) by mouth daily., Disp: 90 tablet, Rfl: 3 .  Vitamin D, Ergocalciferol, (DRISDOL) 1.25 MG (50000 UNIT) CAPS capsule, Take 1 capsule (50,000 Units total) by mouth once a week., Disp: 12 capsule, Rfl: 1 .  docusate sodium (COLACE) 250 MG capsule, Take 250 mg by mouth daily. , Disp: , Rfl:   Current Facility-Administered Medications:  .  cyanocobalamin ((VITAMIN B-12)) injection 1,000 mcg, 1,000 mcg, Intramuscular, Q30 days, Steele Sizer, MD, 1,000 mcg at 12/10/19 1449  Allergies  Allergen Reactions  . [employer] Anaphylaxis    BEE STINGS ALSO  . Ciprofloxacin Nausea And Vomiting  . Cyclobenzaprine Nausea And Vomiting  . Diazepam Nausea And Vomiting  . Ketorolac     HALLUCINATIONS  . Ondansetron Nausea And Vomiting    ABRUPT VOMITTING  . Ranitidine Hcl     FEELS LIKE BUGS CRAWLING ALL OVER HER  . Ambien [Zolpidem] Other (See Comments)    Severely sedating  . Bacitracin-Polymyxin B     BURNS SKIN  . Mushroom Extract Complex     BELL PEPPERS TOO CAUSING ITCHING  . Neomycin-Polymyxin-Gramicidin Rash    BURNS SKIN    I personally reviewed active problem list, medication list, allergies, family history, social history, health maintenance with the patient/caregiver today.   ROS  Constitutional: Negative for fever or weight change.  Respiratory: Negative for cough and shortness of breath.   Cardiovascular: Negative for chest pain or palpitations.  Gastrointestinal: Negative for abdominal pain, no bowel changes.  Musculoskeletal: Negative for gait problem or joint swelling.  Skin: Negative for rash.  Neurological: Negative for dizziness or headache.  No other specific complaints in a complete review of systems (except as listed in HPI above).  Objective  Vitals:   12/10/19 1436  BP: 140/70  Pulse: 84  Resp: 16  Temp: (!) 96.9 F (36.1 C)  TempSrc: Temporal  SpO2: 98%  Weight: 191 lb 11.2 oz (87 kg)  Height: 5'  4"  (1.626 m)    Body mass index is 32.91 kg/m.  Physical Exam  Constitutional: Patient appears well-developed and well-nourished. Obese  No distress.  HEENT: head atraumatic, normocephalic, pupils equal and reactive to light Cardiovascular: Normal rate, regular rhythm and normal heart sounds.  No murmur heard. No BLE edema. Pulmonary/Chest: Effort normal and breath sounds normal. No respiratory distress. Muscular Skeletal: scar on her back is in good aspect, some irritation on lower third , likely from the back brace, no signs of infection.  Abdominal: Soft.  There is no tenderness. Psychiatric: Patient has a normal mood and affect. behavior is normal. Judgment and thought content normal.  Recent Results (from the past 2160 hour(s))  Sedimentation rate     Status: Abnormal   Collection Time: 10/21/19  1:10 PM  Result Value Ref Range   Sed Rate 37 (H) 0 - 30 mm/hr    Comment: Performed at [facility], [address] , [place] [address]  Lactate dehydrogenase     Status: None   Collection Time: 10/21/19  1:10 PM  Result Value Ref Range   LDH 163 98 - 192 U/L    Comment: Performed at [facility], [address] [place], [place] [address]  Comprehensive metabolic panel     Status: Abnormal   Collection Time: 10/21/19  1:10 PM  Result Value Ref Range   Sodium 137 135 - 145 mmol/L   Potassium 4.9 3.5 - 5.1 mmol/L   Chloride 106 98 - 111 mmol/L   CO2 23 22 - 32 mmol/L   Glucose, Bld 197 (H) 70 - 99 mg/dL    Comment: Glucose reference range applies only to samples taken after fasting for at least 8 hours.   BUN 22 8 - 23 mg/dL   Creatinine, Ser 1.03 (H) 0.44 - 1.00 mg/dL   Calcium 8.5 (L) 8.9 - 10.3 mg/dL   Total Protein 7.2 6.5 - 8.1 g/dL   Albumin 4.0 3.5 - 5.0 g/dL   AST 20 15 - 41 U/L   ALT 12 0 - 44 U/L   Alkaline Phosphatase 110 38 - 126 U/L   Total Bilirubin 0.3 0.3 - 1.2 mg/dL   GFR calc non Af Amer 56 (L) >60 mL/min   GFR calc Af Amer >60 >60  mL/min   Anion gap 8 5 - 15    Comment: Performed at [facility], [address] [place], [place] [address]  CBC with Differential/Platelet     Status: Abnormal   Collection Time: 10/21/19  1:10 PM  Result Value Ref Range   WBC 6.6 4.0 - 10.5 K/uL   RBC 3.64 (L) 3.87 - 5.11 MIL/uL   Hemoglobin 10.8 (L) 12.0 - 15.0 g/dL   HCT 33.2 (L) 36.0 - 46.0 %   MCV 91.2 80.0 - 100.0 fL   MCH 29.7 26.0 - 34.0 pg   MCHC 32.5 30.0 - 36.0 g/dL   RDW 12.7 11.5 - 15.5 %   Platelets 259 150 - 400 K/uL   nRBC 0.0 0.0 - 0.2 %   Neutrophils Relative % 55 %   Neutro Abs 3.6 1.7 - 7.7 K/uL   Lymphocytes Relative 32 %   Lymphs Abs 2.1 0.7 - 4.0 K/uL   Monocytes Relative 8 %   Monocytes Absolute 0.6 0.1 - 1.0 K/uL   Eosinophils Relative 2 %   Eosinophils Absolute 0.2 0.0 - 0.5 K/uL   Basophils Relative 1 %   Basophils Absolute 0.1  0.0 - 0.1 K/uL   Immature Granulocytes 2 %   Abs Immature Granulocytes 0.10 (H) 0.00 - 0.07 K/uL    Comment: Performed at [facility], [address] [place], [place] [address]  Iron and TIBC     Status: None   Collection Time: 10/21/19  2:46 PM  Result Value Ref Range   Iron 57 28 - 170 ug/dL   TIBC 323 250 - 450 ug/dL   Saturation Ratios 18 10.4 - 31.8 %   UIBC 266 ug/dL    Comment: Performed at [facility], [address] [place], [place] [address]  Ferritin     Status: None   Collection Time: 10/22/19  2:55 PM  Result Value Ref Range   Ferritin 52 11 - 307 ng/mL    Comment: Performed at [facility], [address] [place], [place] [address]  APTT     Status: None   Collection Time: 10/25/19  9:44 AM  Result Value Ref Range   aPTT 27 24 - 36 seconds    Comment: Performed at [facility], [address] [place], [place] [id number]  Basic metabolic panel     Status: Abnormal   Collection Time: 10/25/19  9:44 AM  Result Value Ref Range   Sodium 137 135 - 145 mmol/L   Potassium 3.8 3.5 - 5.1 mmol/L   Chloride 104  98 - 111 mmol/L   CO2 23 22 - 32 mmol/L   Glucose, Bld 247 (H) 70 - 99 mg/dL    Comment: Glucose reference range applies only to samples taken after fasting for at least 8 hours.   BUN 18 8 - 23 mg/dL   Creatinine, Ser 0.99 0.44 - 1.00 mg/dL   Calcium 8.6 (L) 8.9 - 10.3 mg/dL   GFR calc non Af Amer 59 (L) >60 mL/min   GFR calc Af Amer >60 >60 mL/min   Anion gap 10 5 - 15    Comment: Performed at [facility], [address] [place], [place] [address]  CBC     Status: Abnormal   Collection Time: 10/25/19  9:44 AM  Result Value Ref Range   WBC 6.8 4.0 - 10.5 K/uL   RBC 3.99 3.87 - 5.11 MIL/uL   Hemoglobin 11.8 (L) 12.0 - 15.0 g/dL   HCT 36.0 36.0 - 46.0 %   MCV 90.2 80.0 - 100.0 fL   MCH 29.6 26.0 - 34.0 pg   MCHC 32.8 30.0 - 36.0 g/dL   RDW 12.8 11.5 - 15.5 %   Platelets 243 150 - 400 K/uL   nRBC 0.0 0.0 - 0.2 %    Comment: Performed at [facility], [address] [place], [place] [address]  Protime-INR     Status: None   Collection Time: 10/25/19  9:44 AM  Result Value Ref Range   Prothrombin Time 12.7 11.4 - 15.2 seconds   INR 1.0 0.8 - 1.2    Comment: (NOTE) INR goal varies based on device and disease states. Performed at [facility], [address] [place],  [address]   Surgical pcr screen     Status: None   Collection Time: 10/25/19  9:44 AM   Specimen: Nasal Mucosa; Nasal Swab  Result Value Ref Range   MRSA, PCR NEGATIVE NEGATIVE   Staphylococcus aureus NEGATIVE NEGATIVE    Comment: (NOTE) The Xpert SA Assay (FDA approved for NASAL specimens in patients 17 years of age and older), is one component  of a comprehensive surveillance program. It is not intended to diagnose infection nor to guide or monitor treatment. Performed at [facility], [address] [place], [place] [address]   Type and screen     Status: None   Collection Time: 10/25/19  9:44 AM  Result Value Ref Range   ABO/RH(D) A POS    Antibody  Screen NEG    Sample Expiration 11/08/2019,2359    Extend sample reason      NO TRANSFUSIONS OR PREGNANCY IN THE PAST 3 MONTHS Performed at [facility], [address] [place], [place] [address]   Urinalysis, Routine w reflex microscopic     Status: Abnormal   Collection Time: 10/25/19  9:44 AM  Result Value Ref Range   Color, Urine YELLOW (A) YELLOW   APPearance CLEAR (A) CLEAR   Specific Gravity, Urine 1.017 1.005 - 1.030   pH 5.0 5.0 - 8.0   Glucose, UA NEGATIVE NEGATIVE mg/dL   Hgb urine dipstick NEGATIVE NEGATIVE   Bilirubin Urine NEGATIVE NEGATIVE   Ketones, ur NEGATIVE NEGATIVE mg/dL   Protein, ur 100 (A) NEGATIVE mg/dL   Nitrite NEGATIVE NEGATIVE   Leukocytes,Ua NEGATIVE NEGATIVE   RBC / HPF 0-5 0 - 5 RBC/hpf   WBC, UA 0-5 0 - 5 WBC/hpf   Bacteria, UA NONE SEEN NONE SEEN   Squamous Epithelial / LPF 0-5 0 - 5   Mucus PRESENT     Comment: Performed at [facility], [address] [place], [place] [address]  SARS CORONAVIRUS 2 (TAT 6-24 HRS) Nasopharyngeal Nasopharyngeal Swab     Status: None   Collection Time: 11/01/19  9:21 AM   Specimen: Nasopharyngeal Swab  Result Value Ref Range   SARS Coronavirus 2 NEGATIVE NEGATIVE    Comment: (NOTE) SARS-CoV-2 target nucleic acids are NOT DETECTED. The SARS-CoV-2 RNA is generally detectable in upper and lower respiratory specimens during the acute phase of infection. Negative results do not preclude SARS-CoV-2 infection, do not rule out co-infections with other pathogens, and should not be used as the sole basis for treatment or other patient management decisions. Negative results must be combined with clinical observations, patient history, and epidemiological information. The expected result is Negative. Fact Sheet for Patients: SugarRoll.be Fact Sheet for Healthcare Providers: https://www.woods-mathews.com/ This test is not yet approved or cleared by the [place] FDA and  has been authorized for detection and/or diagnosis of SARS-CoV-2 by FDA under an Emergency Use Authorization (EUA). This EUA will remain  in effect (meaning this test can be used) for the duration of the COVID-19 declaration under Section 56 4(b)(1) of the Act, 21 U.S.C. section 360bbb-3(b)(1), unless the authorization is terminated or revoked sooner. Performed at [facility], [place] [address] [place], [place] [address]   Glucose, capillary     Status: Abnormal   Collection Time: 11/03/19 12:16 PM  Result Value Ref Range   Glucose-Capillary 216 (H) 70 - 99 mg/dL    Comment: Glucose reference range applies only to samples taken after fasting for at least 8 hours.  ABO/Rh     Status: None   Collection Time: 11/03/19  1:52 PM  Result Value Ref Range   ABO/RH(D)      A POS Performed at [facility], [address] [place], [place] [address]   Glucose, capillary     Status: Abnormal   Collection Time: 11/03/19  8:39 PM  Result Value Ref Range   Glucose-Capillary 210 (H) 70 -  99 mg/dL    Comment: Glucose reference range applies only to samples taken after fasting for at least 8 hours.  Hemoglobin A1c     Status: Abnormal   Collection Time: 11/04/19  4:56 AM  Result Value Ref Range   Hgb A1c MFr Bld 6.9 (H) 4.8 - 5.6 %    Comment: (NOTE)         Prediabetes: 5.7 - 6.4         Diabetes: >6.4         Glycemic control for adults with diabetes: <7.0    Mean Plasma Glucose 151 mg/dL    Comment: (NOTE) Performed At: [facility] [place], [place] [id number] Rush Farmer MD [id number]   Glucose, capillary     Status: Abnormal   Collection Time: 11/04/19  8:19 AM  Result Value Ref Range   Glucose-Capillary 245 (H) 70 - 99 mg/dL    Comment: Glucose reference range applies only to samples taken after fasting for at least 8 hours.   Comment 1 Notify RN   Glucose, capillary     Status: Abnormal   Collection Time: 11/04/19 12:05  PM  Result Value Ref Range   Glucose-Capillary 188 (H) 70 - 99 mg/dL    Comment: Glucose reference range applies only to samples taken after fasting for at least 8 hours.   Comment 1 Notify RN   Glucose, capillary     Status: Abnormal   Collection Time: 11/04/19  4:41 PM  Result Value Ref Range   Glucose-Capillary 121 (H) 70 - 99 mg/dL    Comment: Glucose reference range applies only to samples taken after fasting for at least 8 hours.   Comment 1 Notify RN   Glucose, capillary     Status: Abnormal   Collection Time: 11/04/19 10:02 PM  Result Value Ref Range   Glucose-Capillary 146 (H) 70 - 99 mg/dL    Comment: Glucose reference range applies only to samples taken after fasting for at least 8 hours.   Comment 1 Notify RN   Glucose, capillary     Status: Abnormal   Collection Time: 11/05/19  7:42 AM  Result Value Ref Range   Glucose-Capillary 155 (H) 70 - 99 mg/dL    Comment: Glucose reference range applies only to samples taken after fasting for at least 8 hours.   Comment 1 Notify RN   Basic metabolic panel     Status: Abnormal   Collection Time: 11/05/19 11:05 AM  Result Value Ref Range   Sodium 134 (L) 135 - 145 mmol/L   Potassium 3.4 (L) 3.5 - 5.1 mmol/L   Chloride 106 98 - 111 mmol/L   CO2 21 (L) 22 - 32 mmol/L   Glucose, Bld 228 (H) 70 - 99 mg/dL    Comment: Glucose reference range applies only to samples taken after fasting for at least 8 hours.   BUN 20 8 - 23 mg/dL   Creatinine, Ser 0.90 0.44 - 1.00 mg/dL   Calcium 7.9 (L) 8.9 - 10.3 mg/dL   GFR calc non Af Amer >60 >60 mL/min   GFR calc Af Amer >60 >60 mL/min   Anion gap 7 5 - 15    Comment: Performed at [facility], [address] [place], [place] [address]  Glucose, capillary     Status: Abnormal   Collection Time: 11/05/19 11:59 AM  Result Value Ref Range   Glucose-Capillary 171 (H) 70 - 99 mg/dL    Comment: Glucose  reference range applies only to samples taken after fasting for at least 8 hours.    Comment 1 Notify RN   Glucose, capillary     Status: Abnormal   Collection Time: 11/05/19  4:38 PM  Result Value Ref Range   Glucose-Capillary 44 (LL) 70 - 99 mg/dL    Comment: Glucose reference range applies only to samples taken after fasting for at least 8 hours.   Comment 1 Notify RN   Glucose, capillary     Status: None   Collection Time: 11/05/19  5:00 PM  Result Value Ref Range   Glucose-Capillary 88 70 - 99 mg/dL    Comment: Glucose reference range applies only to samples taken after fasting for at least 8 hours.   Comment 1 Notify RN   Glucose, capillary     Status: Abnormal   Collection Time: 11/05/19  9:41 PM  Result Value Ref Range   Glucose-Capillary 185 (H) 70 - 99 mg/dL    Comment: Glucose reference range applies only to samples taken after fasting for at least 8 hours.   Comment 1 Notify RN   Glucose, capillary     Status: Abnormal   Collection Time: 11/06/19  9:04 AM  Result Value Ref Range   Glucose-Capillary 151 (H) 70 - 99 mg/dL    Comment: Glucose reference range applies only to samples taken after fasting for at least 8 hours.  Glucose, capillary     Status: Abnormal   Collection Time: 11/06/19  1:01 PM  Result Value Ref Range   Glucose-Capillary 220 (H) 70 - 99 mg/dL    Comment: Glucose reference range applies only to samples taken after fasting for at least 8 hours.  Glucose, capillary     Status: Abnormal   Collection Time: 11/06/19  5:27 PM  Result Value Ref Range   Glucose-Capillary 173 (H) 70 - 99 mg/dL    Comment: Glucose reference range applies only to samples taken after fasting for at least 8 hours.  Glucose, capillary     Status: Abnormal   Collection Time: 11/06/19  5:50 PM  Result Value Ref Range   Glucose-Capillary 169 (H) 70 - 99 mg/dL    Comment: Glucose reference range applies only to samples taken after fasting for at least 8 hours.  Glucose, capillary     Status: Abnormal   Collection Time: 11/06/19  9:20 PM  Result Value Ref Range    Glucose-Capillary 131 (H) 70 - 99 mg/dL    Comment: Glucose reference range applies only to samples taken after fasting for at least 8 hours.  Glucose, capillary     Status: Abnormal   Collection Time: 11/07/19  7:43 AM  Result Value Ref Range   Glucose-Capillary 106 (H) 70 - 99 mg/dL    Comment: Glucose reference range applies only to samples taken after fasting for at least 8 hours.  Glucose, capillary     Status: Abnormal   Collection Time: 11/07/19 11:52 AM  Result Value Ref Range   Glucose-Capillary 267 (H) 70 - 99 mg/dL    Comment: Glucose reference range applies only to samples taken after fasting for at least 8 hours.  Glucose, capillary     Status: Abnormal   Collection Time: 11/07/19  4:37 PM  Result Value Ref Range   Glucose-Capillary 144 (H) 70 - 99 mg/dL    Comment: Glucose reference range applies only to samples taken after fasting for at least 8 hours.  Glucose, capillary     Status:  Abnormal   Collection Time: 11/07/19  8:28 PM  Result Value Ref Range   Glucose-Capillary 192 (H) 70 - 99 mg/dL    Comment: Glucose reference range applies only to samples taken after fasting for at least 8 hours.   Comment 1 Notify RN   Glucose, capillary     Status: Abnormal   Collection Time: 11/08/19  8:30 AM  Result Value Ref Range   Glucose-Capillary 131 (H) 70 - 99 mg/dL    Comment: Glucose reference range applies only to samples taken after fasting for at least 8 hours.  Glucose, capillary     Status: Abnormal   Collection Time: 11/08/19 11:50 AM  Result Value Ref Range   Glucose-Capillary 144 (H) 70 - 99 mg/dL    Comment: Glucose reference range applies only to samples taken after fasting for at least 8 hours.  Respiratory Panel by RT PCR (Flu A&B, Covid) - Nasopharyngeal Swab     Status: None   Collection Time: 11/08/19 12:11 PM   Specimen: Nasopharyngeal Swab  Result Value Ref Range   SARS Coronavirus 2 by RT PCR NEGATIVE NEGATIVE    Comment: (NOTE) SARS-CoV-2 target  nucleic acids are NOT DETECTED. The SARS-CoV-2 RNA is generally detectable in upper respiratoy specimens during the acute phase of infection. The lowest concentration of SARS-CoV-2 viral copies this assay can detect is 131 copies/mL. A negative result does not preclude SARS-Cov-2 infection and should not be used as the sole basis for treatment or other patient management decisions. A negative result may occur with  improper specimen collection/handling, submission of specimen other than nasopharyngeal swab, presence of viral mutation(s) within the areas targeted by this assay, and inadequate number of viral copies (<131 copies/mL). A negative result must be combined with clinical observations, patient history, and epidemiological information. The expected result is Negative. Fact Sheet for Patients:  PinkCheek.be Fact Sheet for Healthcare Providers:  GravelBags.it This test is not yet ap proved or cleared by the [place] FDA and  has been authorized for detection and/or diagnosis of SARS-CoV-2 by FDA under an Emergency Use Authorization (EUA). This EUA will remain  in effect (meaning this test can be used) for the duration of the COVID-19 declaration under Section 564(b)(1) of the Act, 21 U.S.C. section 360bbb-3(b)(1), unless the authorization is terminated or revoked sooner.    Influenza A by PCR NEGATIVE NEGATIVE   Influenza B by PCR NEGATIVE NEGATIVE    Comment: (NOTE) The Xpert Xpress SARS-CoV-2/FLU/RSV assay is intended as an aid in  the diagnosis of influenza from Nasopharyngeal swab specimens and  should not be used as a sole basis for treatment. Nasal washings and  aspirates are unacceptable for Xpert Xpress SARS-CoV-2/FLU/RSV  testing. Fact Sheet for Patients: PinkCheek.be Fact Sheet for Healthcare Providers: GravelBags.it This test is not yet approved  or cleared by the [place] FDA and  has been authorized for detection and/or diagnosis of SARS-CoV-2 by  FDA under an Emergency Use Authorization (EUA). This EUA will remain  in effect (meaning this test can be used) for the duration of the  Covid-19 declaration under Section 564(b)(1) of the Act, 21  U.S.C. section 360bbb-3(b)(1), unless the authorization is  terminated or revoked. Performed at [facility], [address] [place], [place] [address]   Glucose, capillary     Status: None   Collection Time: 11/08/19  4:33 PM  Result Value Ref Range   Glucose-Capillary 92 70 - 99 mg/dL    Comment: Glucose reference  range applies only to samples taken after fasting for at least 8 hours.  Glucose, capillary     Status: Abnormal   Collection Time: 11/08/19  8:45 PM  Result Value Ref Range   Glucose-Capillary 226 (H) 70 - 99 mg/dL    Comment: Glucose reference range applies only to samples taken after fasting for at least 8 hours.  CBC with Differential/Platelet     Status: Abnormal   Collection Time: 12/10/19  3:28 PM  Result Value Ref Range   WBC 6.8 3.8 - 10.8 Thousand/uL   RBC 3.51 (L) 3.80 - 5.10 Million/uL   Hemoglobin 9.8 (L) 11.7 - 15.5 g/dL   HCT 30.6 (L) 35.0 - 45.0 %   MCV 87.2 80.0 - 100.0 fL   MCH 27.9 27.0 - 33.0 pg   MCHC 32.0 32.0 - 36.0 g/dL   RDW 14.0 11.0 - 15.0 %   Platelets 305 140 - 400 Thousand/uL   MPV 9.6 7.5 - 12.5 fL   Neutro Abs 3,699 1,500 - 7,800 cells/uL   Lymphs Abs 2,244 850 - 3,900 cells/uL   Absolute Monocytes 673 200 - 950 cells/uL   Eosinophils Absolute 122 15 - 500 cells/uL   Basophils Absolute 61 0 - 200 cells/uL   Neutrophils Relative % 54.4 %   Total Lymphocyte 33.0 %   Monocytes Relative 9.9 %   Eosinophils Relative 1.8 %   Basophils Relative 0.9 %  BASIC METABOLIC PANEL WITH GFR     Status: Abnormal   Collection Time: 12/10/19  3:28 PM  Result Value Ref Range   Glucose, Bld 116 (H) 65 - 99 mg/dL    Comment: .             Fasting reference interval . For someone without known diabetes, a glucose value between 100 and 125 mg/dL is consistent with prediabetes and should be confirmed with a follow-up test. .    BUN 28 (H) 7 - 25 mg/dL   Creat 1.13 (H) 0.50 - 0.99 mg/dL    Comment: For patients >34 years of age, the reference limit for Creatinine is approximately 13% higher for people identified as African-American. .    GFR, Est Non African American 50 (L) > OR = 60 mL/min/1.43m2   GFR, Est African American 58 (L) > OR = 60 mL/min/1.2m2   BUN/Creatinine Ratio 25 (H) 6 - 22 (calc)   Sodium 138 135 - 146 mmol/L   Potassium 4.7 3.5 - 5.3 mmol/L   Chloride 105 98 - 110 mmol/L   CO2 23 20 - 32 mmol/L   Calcium 8.8 8.6 - 10.4 mg/dL      PHQ2/9: Depression screen [facility] 2/9 12/10/2019 10/21/2019 09/21/2019 09/01/2019 07/13/2019  Decreased Interest 3 0 2 0 1  Down, Depressed, Hopeless 3 0 2 0 1  PHQ - 2 Score 6 0 4 0 2  Altered sleeping 3 0 1 0 1  Tired, decreased energy 3 0 1 0 1  Change in appetite 2 0 2 0 1  Feeling bad or failure about yourself  2 0 1 0 1  Trouble concentrating 2 0 2 0 1  Moving slowly or fidgety/restless 2 0 3 0 1  Suicidal thoughts 1 0 1 0 0  PHQ-9 Score 21 0 15 0 8  Difficult doing work/chores Very difficult Not difficult at all Extremely dIfficult Not difficult at all Somewhat difficult  Some recent data might be hidden    phq 9 is positive   Fall Risk:  Fall Risk  12/10/2019 10/21/2019 09/21/2019 09/01/2019 07/13/2019  Falls in the past year? 0 1 1 0 0  Comment - - - - -  Number falls in past yr: 0 1 0 0 0  Injury with Fall? 0 1 1 0 0  Comment - - - - -  Risk for fall due to : - - - - -  Follow up - - - Falls evaluation completed Falls evaluation completed    Functional Status Survey: Is the patient deaf or have difficulty hearing?: No Does the patient have difficulty seeing, even when wearing glasses/contacts?: No Does the patient have difficulty concentrating,  remembering, or making decisions?: Yes Does the patient have difficulty walking or climbing stairs?: Yes Does the patient have difficulty dressing or bathing?: No Does the patient have difficulty doing errands alone such as visiting a doctor's office or shopping?: No    Assessment & Plan  1. History of back surgery  Seems to be doing well, driving again, walked here with a cane  2. Hyponatremia  - BASIC METABOLIC PANEL WITH GFR  3. B12 deficiency  - cyanocobalamin ((VITAMIN B-12)) injection 1,000 mcg - CBC with Differential/Platelet  4. Anemia, unspecified type  - CBC with Differential/Platelet  5. Bipolar 1 disorder ([place])  She is down at this time, she just had a visit with her psychiatrist Dr. Hilarie Fredrickson

## 2019-12-12 ENCOUNTER — Other Ambulatory Visit: Payer: Self-pay | Admitting: Family Medicine

## 2019-12-12 DIAGNOSIS — D649 Anemia, unspecified: Secondary | ICD-10-CM

## 2019-12-13 LAB — BASIC METABOLIC PANEL WITH GFR
BUN/Creatinine Ratio: 25 (calc) — ABNORMAL HIGH (ref 6–22)
BUN: 28 mg/dL — ABNORMAL HIGH (ref 7–25)
CO2: 23 mmol/L (ref 20–32)
Calcium: 8.8 mg/dL (ref 8.6–10.4)
Chloride: 105 mmol/L (ref 98–110)
Creat: 1.13 mg/dL — ABNORMAL HIGH (ref 0.50–0.99)
GFR, Est African American: 58 mL/min/{1.73_m2} — ABNORMAL LOW (ref >=60)
GFR, Est Non African American: 50 mL/min/{1.73_m2} — ABNORMAL LOW (ref >=60)
Glucose, Bld: 116 mg/dL — ABNORMAL HIGH (ref 65–99)
Potassium: 4.7 mmol/L (ref 3.5–5.3)
Sodium: 138 mmol/L (ref 135–146)

## 2019-12-13 LAB — IRON,TIBC AND FERRITIN PANEL
%SAT: 13 % (calc) — ABNORMAL LOW (ref 16–45)
Ferritin: 27 ng/mL (ref 16–288)
Iron: 44 ug/dL — ABNORMAL LOW (ref 45–160)
TIBC: 345 mcg/dL (calc) (ref 250–450)

## 2019-12-13 LAB — TEST AUTHORIZATION

## 2019-12-13 LAB — CBC WITH DIFFERENTIAL/PLATELET
Absolute Monocytes: 673 cells/uL (ref 200–950)
Basophils Absolute: 61 cells/uL (ref 0–200)
Basophils Relative: 0.9 %
Eosinophils Absolute: 122 cells/uL (ref 15–500)
Eosinophils Relative: 1.8 %
HCT: 30.6 % — ABNORMAL LOW (ref 35.0–45.0)
Hemoglobin: 9.8 g/dL — ABNORMAL LOW (ref 11.7–15.5)
Lymphs Abs: 2244 cells/uL (ref 850–3900)
MCH: 27.9 pg (ref 27.0–33.0)
MCHC: 32 g/dL (ref 32.0–36.0)
MCV: 87.2 fL (ref 80.0–100.0)
MPV: 9.6 fL (ref 7.5–12.5)
Monocytes Relative: 9.9 %
Neutro Abs: 3699 cells/uL (ref 1500–7800)
Neutrophils Relative %: 54.4 %
Platelets: 305 10*3/uL (ref 140–400)
RBC: 3.51 10*6/uL — ABNORMAL LOW (ref 3.80–5.10)
RDW: 14 % (ref 11.0–15.0)
Total Lymphocyte: 33 %
WBC: 6.8 10*3/uL (ref 3.8–10.8)

## 2019-12-16 ENCOUNTER — Institutional Professional Consult (permissible substitution): Payer: Medicare Other | Admitting: Pulmonary Disease

## 2019-12-17 DIAGNOSIS — E1129 Type 2 diabetes mellitus with other diabetic kidney complication: Secondary | ICD-10-CM | POA: Diagnosis not present

## 2019-12-17 DIAGNOSIS — Z794 Long term (current) use of insulin: Secondary | ICD-10-CM | POA: Diagnosis not present

## 2019-12-17 DIAGNOSIS — E1142 Type 2 diabetes mellitus with diabetic polyneuropathy: Secondary | ICD-10-CM | POA: Diagnosis not present

## 2019-12-17 DIAGNOSIS — N1831 Chronic kidney disease, stage 3a: Secondary | ICD-10-CM | POA: Diagnosis not present

## 2019-12-17 DIAGNOSIS — R809 Proteinuria, unspecified: Secondary | ICD-10-CM | POA: Diagnosis not present

## 2019-12-17 DIAGNOSIS — E1122 Type 2 diabetes mellitus with diabetic chronic kidney disease: Secondary | ICD-10-CM | POA: Diagnosis not present

## 2019-12-17 DIAGNOSIS — M810 Age-related osteoporosis without current pathological fracture: Secondary | ICD-10-CM | POA: Diagnosis not present

## 2019-12-20 DIAGNOSIS — F411 Generalized anxiety disorder: Secondary | ICD-10-CM | POA: Diagnosis not present

## 2019-12-20 DIAGNOSIS — F3132 Bipolar disorder, current episode depressed, moderate: Secondary | ICD-10-CM | POA: Diagnosis not present

## 2019-12-21 ENCOUNTER — Ambulatory Visit
Admission: RE | Admit: 2019-12-21 | Discharge: 2019-12-21 | Disposition: A | Discharge status: Home / Self Care | Payer: Medicare Other | Source: Ambulatory Visit | Attending: Family Medicine | Admitting: Family Medicine

## 2019-12-21 ENCOUNTER — Other Ambulatory Visit: Payer: Self-pay

## 2019-12-21 ENCOUNTER — Ambulatory Visit (INDEPENDENT_AMBULATORY_CARE_PROVIDER_SITE_OTHER): Payer: Medicare Other | Admitting: Family Medicine

## 2019-12-21 ENCOUNTER — Encounter: Payer: Self-pay | Admitting: Family Medicine

## 2019-12-21 ENCOUNTER — Other Ambulatory Visit: Payer: Self-pay | Admitting: Family Medicine

## 2019-12-21 ENCOUNTER — Other Ambulatory Visit
Admission: RE | Admit: 2019-12-21 | Discharge: 2019-12-21 | Disposition: A | Discharge status: Home / Self Care | Payer: Medicare Other | Source: Ambulatory Visit | Attending: *Deleted | Admitting: *Deleted

## 2019-12-21 VITALS — BP 120/74 | HR 84 | Temp 97.9°F | Resp 14 | Ht 63.0 in | Wt 185.1 lb

## 2019-12-21 DIAGNOSIS — R630 Anorexia: Secondary | ICD-10-CM | POA: Diagnosis not present

## 2019-12-21 DIAGNOSIS — R1032 Left lower quadrant pain: Secondary | ICD-10-CM

## 2019-12-21 DIAGNOSIS — Z8719 Personal history of other diseases of the digestive system: Secondary | ICD-10-CM

## 2019-12-21 DIAGNOSIS — R110 Nausea: Secondary | ICD-10-CM | POA: Diagnosis not present

## 2019-12-21 DIAGNOSIS — C8110 Nodular sclerosis classical Hodgkin lymphoma, unspecified site: Secondary | ICD-10-CM | POA: Diagnosis not present

## 2019-12-21 DIAGNOSIS — Z9889 Other specified postprocedural states: Secondary | ICD-10-CM | POA: Insufficient documentation

## 2019-12-21 DIAGNOSIS — I2510 Atherosclerotic heart disease of native coronary artery without angina pectoris: Secondary | ICD-10-CM

## 2019-12-21 DIAGNOSIS — Z8579 Personal history of other malignant neoplasms of lymphoid, hematopoietic and related tissues: Secondary | ICD-10-CM

## 2019-12-21 DIAGNOSIS — E1159 Type 2 diabetes mellitus with other circulatory complications: Secondary | ICD-10-CM | POA: Insufficient documentation

## 2019-12-21 DIAGNOSIS — I700 Atherosclerosis of aorta: Secondary | ICD-10-CM | POA: Insufficient documentation

## 2019-12-21 DIAGNOSIS — I152 Hypertension secondary to endocrine disorders: Secondary | ICD-10-CM | POA: Insufficient documentation

## 2019-12-21 DIAGNOSIS — K5900 Constipation, unspecified: Secondary | ICD-10-CM | POA: Diagnosis not present

## 2019-12-21 DIAGNOSIS — K760 Fatty (change of) liver, not elsewhere classified: Secondary | ICD-10-CM | POA: Diagnosis not present

## 2019-12-21 LAB — IRON AND TIBC
Iron: 36 ug/dL (ref 28–170)
Saturation Ratios: 9 % — ABNORMAL LOW (ref 10.4–31.8)
TIBC: 388 ug/dL (ref 250–450)
UIBC: 352 ug/dL

## 2019-12-21 LAB — CBC WITH DIFFERENTIAL/PLATELET
Abs Immature Granulocytes: 0.04 10*3/uL (ref 0.00–0.07)
Basophils Absolute: 0.1 10*3/uL (ref 0.0–0.1)
Basophils Relative: 1 %
Eosinophils Absolute: 0.1 10*3/uL (ref 0.0–0.5)
Eosinophils Relative: 2 %
HCT: 33.6 % — ABNORMAL LOW (ref 36.0–46.0)
Hemoglobin: 10.6 g/dL — ABNORMAL LOW (ref 12.0–15.0)
Immature Granulocytes: 1 %
Lymphocytes Relative: 33 %
Lymphs Abs: 2.4 10*3/uL (ref 0.7–4.0)
MCH: 27.7 pg (ref 26.0–34.0)
MCHC: 31.5 g/dL (ref 30.0–36.0)
MCV: 87.7 fL (ref 80.0–100.0)
Monocytes Absolute: 0.7 10*3/uL (ref 0.1–1.0)
Monocytes Relative: 10 %
Neutro Abs: 3.9 10*3/uL (ref 1.7–7.7)
Neutrophils Relative %: 53 %
Platelets: 297 10*3/uL (ref 150–400)
RBC: 3.83 MIL/uL — ABNORMAL LOW (ref 3.87–5.11)
RDW: 14 % (ref 11.5–15.5)
WBC: 7.2 10*3/uL (ref 4.0–10.5)
nRBC: 0 % (ref 0.0–0.2)

## 2019-12-21 LAB — COMPREHENSIVE METABOLIC PANEL
ALT: 9 U/L (ref 0–44)
AST: 16 U/L (ref 15–41)
Albumin: 4 g/dL (ref 3.5–5.0)
Alkaline Phosphatase: 142 U/L — ABNORMAL HIGH (ref 38–126)
Anion gap: 8 (ref 5–15)
BUN: 20 mg/dL (ref 8–23)
CO2: 25 mmol/L (ref 22–32)
Calcium: 8.9 mg/dL (ref 8.9–10.3)
Chloride: 97 mmol/L — ABNORMAL LOW (ref 98–111)
Creatinine, Ser: 0.94 mg/dL (ref 0.44–1.00)
GFR calc Af Amer: >60 mL/min (ref >60)
GFR calc non Af Amer: >60 mL/min (ref >60)
Glucose, Bld: 80 mg/dL (ref 70–99)
Potassium: 4.7 mmol/L (ref 3.5–5.1)
Sodium: 130 mmol/L — ABNORMAL LOW (ref 135–145)
Total Bilirubin: 0.5 mg/dL (ref 0.3–1.2)
Total Protein: 7.5 g/dL (ref 6.5–8.1)

## 2019-12-21 LAB — LACTATE DEHYDROGENASE: LDH: 154 U/L (ref 98–192)

## 2019-12-21 LAB — FERRITIN: Ferritin: 34 ng/mL (ref 11–307)

## 2019-12-21 MED ORDER — IOHEXOL 300 MG/ML  SOLN
100.0000 mL | Freq: Once | INTRAMUSCULAR | Status: AC | PRN
  Administered 2019-12-21: 100 mL via INTRAVENOUS

## 2019-12-21 NOTE — Progress Notes (Signed)
Name: Victoria Austin   MRN: [id number]    DOB: [birth year]   Date:12/21/2019       Progress Note  Subjective  Chief Complaint  Chief Complaint  Patient presents with  . Constipation    left sided gas pain   . Irritable Bowel Syndrome  . Gastroesophageal Reflux    HPI  LLQ pain: she has a personal history of diverticulitis, she has chronic constipation, and she went 6 days without a bowel movement  ( that is not unusual for her ) but had large bowel movement Saturday pm, also a large bowel movement daily since. No blood in stools, pain yesterday was very intense that is worse with movement or deep inspiration  , seems to improve after a bowel movement. She has noticed a decrease in appetite , she has also noticed nausea two days ago and also yesterday. Pain is not as severe today, today pain is down to 5/10, she feels like the pain is better than it was yesterday.   Lymphoma/nodular hodgkin's: seen by Dr. Tasia Catchings last year, completed chemo in 2014, she will keep follow up with her yearly    Patient Active Problem List   Diagnosis Date Noted  . Hypertension associated with diabetes ([place]) 12/21/2019  . History of back surgery 12/21/2019  . S/P lumbar fusion 11/03/2019  . Hereditary essential tremor 10/08/2019  . Polypharmacy 10/08/2019  . OSA on CPAP 09/01/2019  . Anticoagulant long-term use 09/01/2019  . DM type 2 with diabetic peripheral neuropathy ([place]) 08/26/2019  . Hyperparathyroidism due to renal insufficiency ([place]) 08/26/2019  . 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 Lumbar degenerative disc disease 06/08/2019  . Lumbar facet arthropathy 06/08/2019  . Chronic pain syndrome 06/08/2019  . Chronic kidney disease due to type 2 diabetes mellitus ([place]) 06/07/2019  . Benign essential HTN 04/15/2019  . Constipation 04/13/2019  . Vertigo 04/13/2019  . Hypertriglyceridemia 04/13/2019  . 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 Diabetic neuropathy ([place]) 02/15/2019  . History of CVA  (cerebrovascular accident) 01/08/2019  . Incontinence   . RA (rheumatoid arthritis) ([place])   . History of temporal arteritis 01/01/2019  . Albuminuria 12/18/2018  . Dyslipidemia associated with type 2 diabetes mellitus ([place]) 12/11/2018  . 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 Anxiety   . Diverticulitis 05/05/2018  . Fibromyalgia 03/21/2016  . Irritable bowel syndrome 09/19/2014  . 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 GERD (gastroesophageal reflux disease) 04/03/2011  . Hiatal hernia 01/12/2007  . Bipolar 1 disorder ([place]) 07/29/2004    Past Surgical History:  Procedure Laterality Date  . BACK SURGERY    . CESAREAN SECTION    . ELBOW ARTHROSCOPY    . FINGER SURGERY    . HAND SURGERY    . HERNIA REPAIR     UMBILICAL REPAIR  . LAMINECTOMY WITH POSTERIOR LATERAL ARTHRODESIS LEVEL 4 N/A 11/03/2019   Procedure: T11-L3 POSTERIOR FUSION, T12-L2 POSTERIOR COLUMN OSTEOTOMIES;  Surgeon: Meade Maw, MD;  Location: [facility] ORS;  Service: Neurosurgery;  Laterality: N/A;  . SPINE SURGERY      Family History  Problem Relation Age of Onset  . Lung cancer Mother   . Hypertension Mother   . Hypertension Sister   . Hypertension Brother   . Bone cancer Maternal Aunt   . Colon cancer Maternal Uncle     Social History   Tobacco Use  . Smoking status: Never Smoker  . Smokeless tobacco: Never Used  Substance Use Topics  . Alcohol use: Never  Current Outpatient Medications:  .  acetaminophen (TYLENOL) 325 MG tablet, Take 2 tablets (650 mg total) by mouth every 4 (four) hours as needed for mild pain ((score 1 to 3) or temp > 100.5)., Disp: 60 tablet, Rfl: 0 .  ADVAIR HFA 230-21 MCG/ACT inhaler, Inhale 1 puff into the lungs 2 (two) times daily. (Patient taking differently: Inhale 1 puff into the lungs 2 (two) times daily as needed. ), Disp: 1 Inhaler, Rfl: 3 .  albuterol (PROAIR HFA) 108 (90 Base) MCG/ACT inhaler, Inhale 1 puff into the lungs 2 (two) times daily as needed.,  Disp: 18 g, Rfl: 1 .  alendronate (FOSAMAX) 70 MG tablet, Take 70 mg by mouth once a week. , Disp: , Rfl:  .  carbamazepine (TEGRETOL) 200 MG tablet, Take 400 mg by mouth 3 (three) times daily. Brianne Klinger,OIC , Disp: , Rfl:  .  celecoxib (CELEBREX) 100 MG capsule, Take 1 capsule (100 mg total) by mouth 2 (two) times daily., Disp: 60 capsule, Rfl: 0 .  cloNIDine (CATAPRES) 0.1 MG tablet, Take 1 tablet (0.1 mg total) by mouth daily as needed (blood pressure >180/100)., Disp: 60 tablet, Rfl: 3 .  Continuous Blood Gluc Receiver (FREESTYLE LIBRE 14 DAY READER) DEVI, 1 Device by Does not apply route 4 (four) times daily., Disp: 1 Device, Rfl: 0 .  Continuous Blood Gluc Sensor (FREESTYLE LIBRE 14 DAY SENSOR) MISC, 1 Device by Does not apply route every 14 (fourteen) days., Disp: 6 each, Rfl: 1 .  cyanocobalamin (,VITAMIN B-12,) 1000 MCG/ML injection, Inject 1,000 mcg into the muscle every 30 (thirty) days. , Disp: , Rfl:  .  docusate sodium (COLACE) 250 MG capsule, Take 250 mg by mouth daily. , Disp: , Rfl:  .  DULoxetine (CYMBALTA) 60 MG capsule, Take 60 mg by mouth daily. , Disp: , Rfl:  .  empagliflozin (JARDIANCE) 10 MG TABS tablet, Take 10 mg by mouth daily., Disp: , Rfl:  .  EPINEPHrine (EPIPEN 2-PAK) 0.3 mg/0.3 mL IJ SOAJ injection, Inject 0.3 mLs (0.3 mg total) into the muscle as needed for anaphylaxis. Then call 911 for ER transport., Disp: 0.3 mL, Rfl: 1 .  hydrALAZINE (APRESOLINE) 50 MG tablet, Take 1 tablet (50 mg total) by mouth 3 (three) times daily., Disp: 270 tablet, Rfl: 3 .  Icosapent Ethyl 1 g CAPS, Take 2 capsules (2 g total) by mouth 2 (two) times daily., Disp: 360 capsule, Rfl: 3 .  Insulin Glargine (LANTUS SOLOSTAR) 100 UNIT/ML Solostar Pen, Inject 85 Units into the skin daily. (Patient taking differently: Inject 85 Units into the skin at bedtime. ), Disp: 10 pen, Rfl: 3 .  lisinopril (ZESTRIL) 5 MG tablet, Take 1 tablet (5 mg total) by mouth daily., Disp: 90 tablet, Rfl: 3 .   loratadine (CLARITIN) 10 MG tablet, Take 1 tablet (10 mg total) by mouth daily., Disp: 90 tablet, Rfl: 1 .  LORazepam (ATIVAN) 1 MG tablet, Take 1 mg by mouth daily as needed for anxiety or sleep. , Disp: , Rfl:  .  meclizine (ANTIVERT) 25 MG tablet, Take 1 tablet (25 mg total) by mouth 3 (three) times daily as needed for dizziness. (Patient taking differently: Take 25-50 mg by mouth See admin instructions. Take 50 mg by mouth in the morning and 25 mg in the afternoon), Disp: 180 tablet, Rfl: 1 .  methocarbamol (ROBAXIN) 500 MG tablet, Take 2 tablets (1,000 mg total) by mouth every 6 (six) hours., Disp: 80 tablet, Rfl: 0 .  mirabegron ER (MYRBETRIQ)  50 MG TB24 tablet, Take 1 tablet (50 mg total) by mouth daily. (Patient taking differently: Take 100 mg by mouth daily. ), Disp: 90 tablet, Rfl: 3 .  montelukast (SINGULAIR) 10 MG tablet, Take 1 tablet (10 mg total) by mouth daily., Disp: 90 tablet, Rfl: 0 .  NIFEdipine (ADALAT CC) 60 MG 24 hr tablet, Take 1 tablet (60 mg total) by mouth 2 (two) times daily., Disp: 180 tablet, Rfl: 1 .  NOVOLOG FLEXPEN 100 UNIT/ML FlexPen, Inject 22-34 Units into the skin 3 (three) times daily. With meals, Disp: 3 pen, Rfl: 3 .  pantoprazole (PROTONIX) 40 MG tablet, Take 1 tablet (40 mg total) by mouth daily., Disp: 90 tablet, Rfl: 1 .  primidone (MYSOLINE) 50 MG tablet, Take 4 tablets (200 mg total) by mouth at bedtime., Disp: , Rfl:  .  promethazine (PHENERGAN) 25 MG tablet, Take 1 tablet (25 mg total) by mouth every 6 (six) hours as needed., Disp: 30 tablet, Rfl: 1 .  propranolol (INDERAL) 10 MG tablet, Take 1 tablet (10 mg total) by mouth 2 (two) times daily. (Patient taking differently: Take 10 mg by mouth daily. ), Disp: 180 tablet, Rfl: 1 .  rosuvastatin (CRESTOR) 20 MG tablet, Take 1 tablet (20 mg total) by mouth daily., Disp: 90 tablet, Rfl: 3 .  SURE COMFORT PEN NEEDLES 31G X 5 MM MISC, Inject 1 pen as directed 5 (five) times daily as needed., Disp: 15 each, Rfl:  0 .  trimethoprim (TRIMPEX) 100 MG tablet, Take 1 tablet (100 mg total) by mouth daily., Disp: 90 tablet, Rfl: 3 .  Vitamin D, Ergocalciferol, (DRISDOL) 1.25 MG (50000 UNIT) CAPS capsule, Take 1 capsule (50,000 Units total) by mouth once a week., Disp: 12 capsule, Rfl: 1  Current Facility-Administered Medications:  .  cyanocobalamin ((VITAMIN B-12)) injection 1,000 mcg, 1,000 mcg, Intramuscular, Q30 days, Steele Sizer, MD, 1,000 mcg at 12/10/19 1449  Allergies  Allergen Reactions  . [employer] Anaphylaxis    BEE STINGS ALSO  . Ciprofloxacin Nausea And Vomiting  . Cyclobenzaprine Nausea And Vomiting  . Diazepam Nausea And Vomiting  . Ketorolac     HALLUCINATIONS  . Ondansetron Nausea And Vomiting    ABRUPT VOMITTING  . Ranitidine Hcl     FEELS LIKE BUGS CRAWLING ALL OVER HER  . Ambien [Zolpidem] Other (See Comments)    Severely sedating  . Bacitracin-Polymyxin B     BURNS SKIN  . Mushroom Extract Complex     BELL PEPPERS TOO CAUSING ITCHING  . Neomycin-Polymyxin-Gramicidin Rash    BURNS SKIN    I personally reviewed active problem list, medication list, allergies, family history, social history, health maintenance with the patient/caregiver today.   ROS  Constitutional: Negative for fever or weight change.  Respiratory: Negative for cough and shortness of breath.   Cardiovascular: Negative for chest pain or palpitations.  Gastrointestinal: positive  for abdominal pain and  bowel changes.  Musculoskeletal: Negative for gait problem or joint swelling.  Skin: Negative for rash.  Neurological: Negative for dizziness or headache.  No other specific complaints in a complete review of systems (except as listed in HPI above).  Objective  Vitals:   12/21/19 1400  BP: 120/74  Pulse: 84  Resp: 14  Temp: 97.9 F (36.6 C)  TempSrc: Temporal  SpO2: 98%  Weight: 185 lb 1.6 oz (84 kg)  Height: 5\' 3"  (1.6 m)    Body mass index is 32.79 kg/m.  Physical  Exam  Constitutional: Patient appears well-developed  and well-nourished. Obese  Intermittent distress - movement causes abdominal pain  HEENT: head atraumatic, normocephalic, pupils equal and reactive to light, neck supple Cardiovascular: Normal rate, regular rhythm and normal heart sounds.  No murmur heard. No BLE edema. Pulmonary/Chest: Effort normal and breath sounds normal. No respiratory distress. Abdominal: Soft.  Normal bowel sounds, very tender during palpation of LLQ , rebound tenderness, also difficulty walking because it causes increase in LLQ pain  Psychiatric: Patient has a normal mood and affect. behavior is normal. Judgment and thought content normal.  Recent Results (from the past 2160 hour(s))  Sedimentation rate     Status: Abnormal   Collection Time: 10/21/19  1:10 PM  Result Value Ref Range   Sed Rate 37 (H) 0 - 30 mm/hr    Comment: Performed at [facility], [address] [place], [place] [address]  Lactate dehydrogenase     Status: None   Collection Time: 10/21/19  1:10 PM  Result Value Ref Range   LDH 163 98 - 192 U/L    Comment: Performed at [facility], [address] [place], [place] [address]  Comprehensive metabolic panel     Status: Abnormal   Collection Time: 10/21/19  1:10 PM  Result Value Ref Range   Sodium 137 135 - 145 mmol/L   Potassium 4.9 3.5 - 5.1 mmol/L   Chloride 106 98 - 111 mmol/L   CO2 23 22 - 32 mmol/L   Glucose, Bld 197 (H) 70 - 99 mg/dL    Comment: Glucose reference range applies only to samples taken after fasting for at least 8 hours.   BUN 22 8 - 23 mg/dL   Creatinine, Ser 1.03 (H) 0.44 - 1.00 mg/dL   Calcium 8.5 (L) 8.9 - 10.3 mg/dL   Total Protein 7.2 6.5 - 8.1 g/dL   Albumin 4.0 3.5 - 5.0 g/dL   AST 20 15 - 41 U/L   ALT 12 0 - 44 U/L   Alkaline Phosphatase 110 38 - 126 U/L   Total Bilirubin 0.3 0.3 - 1.2 mg/dL   GFR calc non Af Amer 56 (L) >60 mL/min   GFR calc Af Amer >60 >60 mL/min   Anion gap 8 5 - 15     Comment: Performed at [facility], [address] [place], [place] [address]  CBC with Differential/Platelet     Status: Abnormal   Collection Time: 10/21/19  1:10 PM  Result Value Ref Range   WBC 6.6 4.0 - 10.5 K/uL   RBC 3.64 (L) 3.87 - 5.11 MIL/uL   Hemoglobin 10.8 (L) 12.0 - 15.0 g/dL   HCT 33.2 (L) 36.0 - 46.0 %   MCV 91.2 80.0 - 100.0 fL   MCH 29.7 26.0 - 34.0 pg   MCHC 32.5 30.0 - 36.0 g/dL   RDW 12.7 11.5 - 15.5 %   Platelets 259 150 - 400 K/uL   nRBC 0.0 0.0 - 0.2 %   Neutrophils Relative % 55 %   Neutro Abs 3.6 1.7 - 7.7 K/uL   Lymphocytes Relative 32 %   Lymphs Abs 2.1 0.7 - 4.0 K/uL   Monocytes Relative 8 %   Monocytes Absolute 0.6 0.1 - 1.0 K/uL   Eosinophils Relative 2 %   Eosinophils Absolute 0.2 0.0 - 0.5 K/uL   Basophils Relative 1 %   Basophils Absolute 0.1 0.0 - 0.1 K/uL   Immature Granulocytes 2 %   Abs Immature Granulocytes 0.10 (H) 0.00 - 0.07 K/uL  Comment: Performed at [facility], [address] [place], [place] [address]  Iron and TIBC     Status: None   Collection Time: 10/21/19  2:46 PM  Result Value Ref Range   Iron 57 28 - 170 ug/dL   TIBC 323 250 - 450 ug/dL   Saturation Ratios 18 10.4 - 31.8 %   UIBC 266 ug/dL    Comment: Performed at [facility], [address] [place], [place] [address]  Ferritin     Status: None   Collection Time: 10/22/19  2:55 PM  Result Value Ref Range   Ferritin 52 11 - 307 ng/mL    Comment: Performed at [facility], [address] [place], [place] [address]  APTT     Status: None   Collection Time: 10/25/19  9:44 AM  Result Value Ref Range   aPTT 27 24 - 36 seconds    Comment: Performed at [facility], [address] [place], [place] [id number]  Basic metabolic panel     Status: Abnormal   Collection Time: 10/25/19  9:44 AM  Result Value Ref Range   Sodium 137 135 - 145 mmol/L   Potassium 3.8 3.5 - 5.1 mmol/L   Chloride 104 98 - 111 mmol/L   CO2 23 22  - 32 mmol/L   Glucose, Bld 247 (H) 70 - 99 mg/dL    Comment: Glucose reference range applies only to samples taken after fasting for at least 8 hours.   BUN 18 8 - 23 mg/dL   Creatinine, Ser 0.99 0.44 - 1.00 mg/dL   Calcium 8.6 (L) 8.9 - 10.3 mg/dL   GFR calc non Af Amer 59 (L) >60 mL/min   GFR calc Af Amer >60 >60 mL/min   Anion gap 10 5 - 15    Comment: Performed at [facility], [address] [place], [place] [address]  CBC     Status: Abnormal   Collection Time: 10/25/19  9:44 AM  Result Value Ref Range   WBC 6.8 4.0 - 10.5 K/uL   RBC 3.99 3.87 - 5.11 MIL/uL   Hemoglobin 11.8 (L) 12.0 - 15.0 g/dL   HCT 36.0 36.0 - 46.0 %   MCV 90.2 80.0 - 100.0 fL   MCH 29.6 26.0 - 34.0 pg   MCHC 32.8 30.0 - 36.0 g/dL   RDW 12.8 11.5 - 15.5 %   Platelets 243 150 - 400 K/uL   nRBC 0.0 0.0 - 0.2 %    Comment: Performed at [facility], [address] [place], [place] [address]  Protime-INR     Status: None   Collection Time: 10/25/19  9:44 AM  Result Value Ref Range   Prothrombin Time 12.7 11.4 - 15.2 seconds   INR 1.0 0.8 - 1.2    Comment: (NOTE) INR goal varies based on device and disease states. Performed at [facility], [address] [place], [place] [address]   Surgical pcr screen     Status: None   Collection Time: 10/25/19  9:44 AM   Specimen: Nasal Mucosa; Nasal Swab  Result Value Ref Range   MRSA, PCR NEGATIVE NEGATIVE   Staphylococcus aureus NEGATIVE NEGATIVE    Comment: (NOTE) The Xpert SA Assay (FDA approved for NASAL specimens in patients 62 years of age and older), is one component of a comprehensive surveillance program. It is not intended to diagnose infection nor to guide or monitor treatment. Performed at [facility], [phone] [place], [place] [address]   Type and screen     Status: None   Collection Time: 10/25/19  9:44 AM  Result Value Ref Range   ABO/RH(D) A POS    Antibody Screen NEG    Sample Expiration  11/08/2019,2359    Extend sample reason      NO TRANSFUSIONS OR PREGNANCY IN THE PAST 3 MONTHS Performed at [facility], [address] [place], [place] [address]   Urinalysis, Routine w reflex microscopic     Status: Abnormal   Collection Time: 10/25/19  9:44 AM  Result Value Ref Range   Color, Urine YELLOW (A) YELLOW   APPearance CLEAR (A) CLEAR   Specific Gravity, Urine 1.017 1.005 - 1.030   pH 5.0 5.0 - 8.0   Glucose, UA NEGATIVE NEGATIVE mg/dL   Hgb urine dipstick NEGATIVE NEGATIVE   Bilirubin Urine NEGATIVE NEGATIVE   Ketones, ur NEGATIVE NEGATIVE mg/dL   Protein, ur 100 (A) NEGATIVE mg/dL   Nitrite NEGATIVE NEGATIVE   Leukocytes,Ua NEGATIVE NEGATIVE   RBC / HPF 0-5 0 - 5 RBC/hpf   WBC, UA 0-5 0 - 5 WBC/hpf   Bacteria, UA NONE SEEN NONE SEEN   Squamous Epithelial / LPF 0-5 0 - 5   Mucus PRESENT     Comment: Performed at [facility], [address] [place], [place] [address]  SARS CORONAVIRUS 2 (TAT 6-24 HRS) Nasopharyngeal Nasopharyngeal Swab     Status: None   Collection Time: 11/01/19  9:21 AM   Specimen: Nasopharyngeal Swab  Result Value Ref Range   SARS Coronavirus 2 NEGATIVE NEGATIVE    Comment: (NOTE) SARS-CoV-2 target nucleic acids are NOT DETECTED. The SARS-CoV-2 RNA is generally detectable in upper and lower respiratory specimens during the acute phase of infection. Negative results do not preclude SARS-CoV-2 infection, do not rule out co-infections with other pathogens, and should not be used as the sole basis for treatment or other patient management decisions. Negative results must be combined with clinical observations, patient history, and epidemiological information. The expected result is Negative. Fact Sheet for Patients: SugarRoll.be Fact Sheet for Healthcare Providers: https://www.woods-mathews.com/ This test is not yet approved or cleared by the [place] FDA and  has been  authorized for detection and/or diagnosis of SARS-CoV-2 by FDA under an Emergency Use Authorization (EUA). This EUA will remain  in effect (meaning this test can be used) for the duration of the COVID-19 declaration under Section 56 4(b)(1) of the Act, 21 U.S.C. section 360bbb-3(b)(1), unless the authorization is terminated or revoked sooner. Performed at [facility], [place] [place], [place] [address]   Glucose, capillary     Status: Abnormal   Collection Time: 11/03/19 12:16 PM  Result Value Ref Range   Glucose-Capillary 216 (H) 70 - 99 mg/dL    Comment: Glucose reference range applies only to samples taken after fasting for at least 8 hours.  ABO/Rh     Status: None   Collection Time: 11/03/19  1:52 PM  Result Value Ref Range   ABO/RH(D)      A POS Performed at [facility], [address] [place], [place] [address]   Glucose, capillary     Status: Abnormal   Collection Time: 11/03/19  8:39 PM  Result Value Ref Range   Glucose-Capillary 210 (H) 70 - 99 mg/dL    Comment: Glucose reference range applies only to samples taken after fasting for at least 8 hours.  Hemoglobin  A1c     Status: Abnormal   Collection Time: 11/04/19  4:56 AM  Result Value Ref Range   Hgb A1c MFr Bld 6.9 (H) 4.8 - 5.6 %    Comment: (NOTE)         Prediabetes: 5.7 - 6.4         Diabetes: >6.4         Glycemic control for adults with diabetes: <7.0    Mean Plasma Glucose 151 mg/dL    Comment: (NOTE) Performed At: [facility] [place], [place] [id number] Rush Farmer MD [id number]   Glucose, capillary     Status: Abnormal   Collection Time: 11/04/19  8:19 AM  Result Value Ref Range   Glucose-Capillary 245 (H) 70 - 99 mg/dL    Comment: Glucose reference range applies only to samples taken after fasting for at least 8 hours.   Comment 1 Notify RN   Glucose, capillary     Status: Abnormal   Collection Time: 11/04/19 12:05 PM  Result Value Ref  Range   Glucose-Capillary 188 (H) 70 - 99 mg/dL    Comment: Glucose reference range applies only to samples taken after fasting for at least 8 hours.   Comment 1 Notify RN   Glucose, capillary     Status: Abnormal   Collection Time: 11/04/19  4:41 PM  Result Value Ref Range   Glucose-Capillary 121 (H) 70 - 99 mg/dL    Comment: Glucose reference range applies only to samples taken after fasting for at least 8 hours.   Comment 1 Notify RN   Glucose, capillary     Status: Abnormal   Collection Time: 11/04/19 10:02 PM  Result Value Ref Range   Glucose-Capillary 146 (H) 70 - 99 mg/dL    Comment: Glucose reference range applies only to samples taken after fasting for at least 8 hours.   Comment 1 Notify RN   Glucose, capillary     Status: Abnormal   Collection Time: 11/05/19  7:42 AM  Result Value Ref Range   Glucose-Capillary 155 (H) 70 - 99 mg/dL    Comment: Glucose reference range applies only to samples taken after fasting for at least 8 hours.   Comment 1 Notify RN   Basic metabolic panel     Status: Abnormal   Collection Time: 11/05/19 11:05 AM  Result Value Ref Range   Sodium 134 (L) 135 - 145 mmol/L   Potassium 3.4 (L) 3.5 - 5.1 mmol/L   Chloride 106 98 - 111 mmol/L   CO2 21 (L) 22 - 32 mmol/L   Glucose, Bld 228 (H) 70 - 99 mg/dL    Comment: Glucose reference range applies only to samples taken after fasting for at least 8 hours.   BUN 20 8 - 23 mg/dL   Creatinine, Ser 0.90 0.44 - 1.00 mg/dL   Calcium 7.9 (L) 8.9 - 10.3 mg/dL   GFR calc non Af Amer >60 >60 mL/min   GFR calc Af Amer >60 >60 mL/min   Anion gap 7 5 - 15    Comment: Performed at [facility], [address] [place], [place] [address]  Glucose, capillary     Status: Abnormal   Collection Time: 11/05/19 11:59 AM  Result Value Ref Range   Glucose-Capillary 171 (H) 70 - 99 mg/dL    Comment: Glucose reference range applies only to samples taken after fasting for at least 8 hours.   Comment 1 Notify RN  Glucose, capillary     Status: Abnormal   Collection Time: 11/05/19  4:38 PM  Result Value Ref Range   Glucose-Capillary 44 (LL) 70 - 99 mg/dL    Comment: Glucose reference range applies only to samples taken after fasting for at least 8 hours.   Comment 1 Notify RN   Glucose, capillary     Status: None   Collection Time: 11/05/19  5:00 PM  Result Value Ref Range   Glucose-Capillary 88 70 - 99 mg/dL    Comment: Glucose reference range applies only to samples taken after fasting for at least 8 hours.   Comment 1 Notify RN   Glucose, capillary     Status: Abnormal   Collection Time: 11/05/19  9:41 PM  Result Value Ref Range   Glucose-Capillary 185 (H) 70 - 99 mg/dL    Comment: Glucose reference range applies only to samples taken after fasting for at least 8 hours.   Comment 1 Notify RN   Glucose, capillary     Status: Abnormal   Collection Time: 11/06/19  9:04 AM  Result Value Ref Range   Glucose-Capillary 151 (H) 70 - 99 mg/dL    Comment: Glucose reference range applies only to samples taken after fasting for at least 8 hours.  Glucose, capillary     Status: Abnormal   Collection Time: 11/06/19  1:01 PM  Result Value Ref Range   Glucose-Capillary 220 (H) 70 - 99 mg/dL    Comment: Glucose reference range applies only to samples taken after fasting for at least 8 hours.  Glucose, capillary     Status: Abnormal   Collection Time: 11/06/19  5:27 PM  Result Value Ref Range   Glucose-Capillary 173 (H) 70 - 99 mg/dL    Comment: Glucose reference range applies only to samples taken after fasting for at least 8 hours.  Glucose, capillary     Status: Abnormal   Collection Time: 11/06/19  5:50 PM  Result Value Ref Range   Glucose-Capillary 169 (H) 70 - 99 mg/dL    Comment: Glucose reference range applies only to samples taken after fasting for at least 8 hours.  Glucose, capillary     Status: Abnormal   Collection Time: 11/06/19  9:20 PM  Result Value Ref Range   Glucose-Capillary 131  (H) 70 - 99 mg/dL    Comment: Glucose reference range applies only to samples taken after fasting for at least 8 hours.  Glucose, capillary     Status: Abnormal   Collection Time: 11/07/19  7:43 AM  Result Value Ref Range   Glucose-Capillary 106 (H) 70 - 99 mg/dL    Comment: Glucose reference range applies only to samples taken after fasting for at least 8 hours.  Glucose, capillary     Status: Abnormal   Collection Time: 11/07/19 11:52 AM  Result Value Ref Range   Glucose-Capillary 267 (H) 70 - 99 mg/dL    Comment: Glucose reference range applies only to samples taken after fasting for at least 8 hours.  Glucose, capillary     Status: Abnormal   Collection Time: 11/07/19  4:37 PM  Result Value Ref Range   Glucose-Capillary 144 (H) 70 - 99 mg/dL    Comment: Glucose reference range applies only to samples taken after fasting for at least 8 hours.  Glucose, capillary     Status: Abnormal   Collection Time: 11/07/19  8:28 PM  Result Value Ref Range   Glucose-Capillary 192 (H) 70 - 99 mg/dL  Comment: Glucose reference range applies only to samples taken after fasting for at least 8 hours.   Comment 1 Notify RN   Glucose, capillary     Status: Abnormal   Collection Time: 11/08/19  8:30 AM  Result Value Ref Range   Glucose-Capillary 131 (H) 70 - 99 mg/dL    Comment: Glucose reference range applies only to samples taken after fasting for at least 8 hours.  Glucose, capillary     Status: Abnormal   Collection Time: 11/08/19 11:50 AM  Result Value Ref Range   Glucose-Capillary 144 (H) 70 - 99 mg/dL    Comment: Glucose reference range applies only to samples taken after fasting for at least 8 hours.  Respiratory Panel by RT PCR (Flu A&B, Covid) - Nasopharyngeal Swab     Status: None   Collection Time: 11/08/19 12:11 PM   Specimen: Nasopharyngeal Swab  Result Value Ref Range   SARS Coronavirus 2 by RT PCR NEGATIVE NEGATIVE    Comment: (NOTE) SARS-CoV-2 target nucleic acids are NOT  DETECTED. The SARS-CoV-2 RNA is generally detectable in upper respiratoy specimens during the acute phase of infection. The lowest concentration of SARS-CoV-2 viral copies this assay can detect is 131 copies/mL. A negative result does not preclude SARS-Cov-2 infection and should not be used as the sole basis for treatment or other patient management decisions. A negative result may occur with  improper specimen collection/handling, submission of specimen other than nasopharyngeal swab, presence of viral mutation(s) within the areas targeted by this assay, and inadequate number of viral copies (<131 copies/mL). A negative result must be combined with clinical observations, patient history, and epidemiological information. The expected result is Negative. Fact Sheet for Patients:  PinkCheek.be Fact Sheet for Healthcare Providers:  GravelBags.it This test is not yet ap proved or cleared by the [place] FDA and  has been authorized for detection and/or diagnosis of SARS-CoV-2 by FDA under an Emergency Use Authorization (EUA). This EUA will remain  in effect (meaning this test can be used) for the duration of the COVID-19 declaration under Section 564(b)(1) of the Act, 21 U.S.C. section 360bbb-3(b)(1), unless the authorization is terminated or revoked sooner.    Influenza A by PCR NEGATIVE NEGATIVE   Influenza B by PCR NEGATIVE NEGATIVE    Comment: (NOTE) The Xpert Xpress SARS-CoV-2/FLU/RSV assay is intended as an aid in  the diagnosis of influenza from Nasopharyngeal swab specimens and  should not be used as a sole basis for treatment. Nasal washings and  aspirates are unacceptable for Xpert Xpress SARS-CoV-2/FLU/RSV  testing. Fact Sheet for Patients: PinkCheek.be Fact Sheet for Healthcare Providers: GravelBags.it This test is not yet approved or cleared by the  [place] FDA and  has been authorized for detection and/or diagnosis of SARS-CoV-2 by  FDA under an Emergency Use Authorization (EUA). This EUA will remain  in effect (meaning this test can be used) for the duration of the  Covid-19 declaration under Section 564(b)(1) of the Act, 21  U.S.C. section 360bbb-3(b)(1), unless the authorization is  terminated or revoked. Performed at [facility], [address] [place],  [address]   Glucose, capillary     Status: None   Collection Time: 11/08/19  4:33 PM  Result Value Ref Range   Glucose-Capillary 92 70 - 99 mg/dL    Comment: Glucose reference range applies only to samples taken after fasting for at least 8 hours.  Glucose, capillary     Status: Abnormal   Collection Time:  11/08/19  8:45 PM  Result Value Ref Range   Glucose-Capillary 226 (H) 70 - 99 mg/dL    Comment: Glucose reference range applies only to samples taken after fasting for at least 8 hours.  CBC with Differential/Platelet     Status: Abnormal   Collection Time: 12/10/19  3:28 PM  Result Value Ref Range   WBC 6.8 3.8 - 10.8 Thousand/uL   RBC 3.51 (L) 3.80 - 5.10 Million/uL   Hemoglobin 9.8 (L) 11.7 - 15.5 g/dL   HCT 30.6 (L) 35.0 - 45.0 %   MCV 87.2 80.0 - 100.0 fL   MCH 27.9 27.0 - 33.0 pg   MCHC 32.0 32.0 - 36.0 g/dL   RDW 14.0 11.0 - 15.0 %   Platelets 305 140 - 400 Thousand/uL   MPV 9.6 7.5 - 12.5 fL   Neutro Abs 3,699 1,500 - 7,800 cells/uL   Lymphs Abs 2,244 850 - 3,900 cells/uL   Absolute Monocytes 673 200 - 950 cells/uL   Eosinophils Absolute 122 15 - 500 cells/uL   Basophils Absolute 61 0 - 200 cells/uL   Neutrophils Relative % 54.4 %   Total Lymphocyte 33.0 %   Monocytes Relative 9.9 %   Eosinophils Relative 1.8 %   Basophils Relative 0.9 %  BASIC METABOLIC PANEL WITH GFR     Status: Abnormal   Collection Time: 12/10/19  3:28 PM  Result Value Ref Range   Glucose, Bld 116 (H) 65 - 99 mg/dL    Comment: .            Fasting  reference interval . For someone without known diabetes, a glucose value between 100 and 125 mg/dL is consistent with prediabetes and should be confirmed with a follow-up test. .    BUN 28 (H) 7 - 25 mg/dL   Creat 1.13 (H) 0.50 - 0.99 mg/dL    Comment: For patients >75 years of age, the reference limit for Creatinine is approximately 13% higher for people identified as African-American. .    GFR, Est Non African American 50 (L) > OR = 60 mL/min/1.20m2   GFR, Est African American 58 (L) > OR = 60 mL/min/1.55m2   BUN/Creatinine Ratio 25 (H) 6 - 22 (calc)   Sodium 138 135 - 146 mmol/L   Potassium 4.7 3.5 - 5.3 mmol/L   Chloride 105 98 - 110 mmol/L   CO2 23 20 - 32 mmol/L   Calcium 8.8 8.6 - 10.4 mg/dL  Iron, TIBC and Ferritin Panel     Status: Abnormal   Collection Time: 12/10/19  3:28 PM  Result Value Ref Range   Iron 44 (L) 45 - 160 mcg/dL   TIBC 345 250 - 450 mcg/dL (calc)   %SAT 13 (L) 16 - 45 % (calc)   Ferritin 27 16 - 288 ng/mL  TEST AUTHORIZATION     Status: None   Collection Time: 12/10/19  3:28 PM  Result Value Ref Range   TEST NAME: IRON, TIBC AND FERRITIN PANEL    TEST CODE: [id number]    CLIENT CONTACT: LISLEY SMITH    REPORT ALWAYS MESSAGE SIGNATURE      Comment: . The laboratory testing on this patient was verbally requested or confirmed by the ordering physician or his or her authorized representative after contact with an employee of [employer]. Federal regulations require that we maintain on file written authorization for all laboratory testing.  Accordingly we are asking that the ordering physician or his or her authorized representative  sign a copy of this report and promptly return it to the client service representative. . . Signature:____________________________________________________ . Please fax this signed page to [phone] or return it via your [employer] courier.      PHQ2/9: Depression screen [facility] 2/9 12/21/2019  12/10/2019 10/21/2019 09/21/2019 09/01/2019  Decreased Interest 3 3 0 2 0  Down, Depressed, Hopeless 3 3 0 2 0  PHQ - 2 Score 6 6 0 4 0  Altered sleeping 3 3 0 1 0  Tired, decreased energy 3 3 0 1 0  Change in appetite 3 2 0 2 0  Feeling bad or failure about yourself  3 2 0 1 0  Trouble concentrating 3 2 0 2 0  Moving slowly or fidgety/restless 0 2 0 3 0  Suicidal thoughts 0 1 0 1 0  PHQ-9 Score 21 21 0 15 0  Difficult doing work/chores - Very difficult Not difficult at all Extremely dIfficult Not difficult at all  Some recent data might be hidden    phq 9 is positive under the care of psychiatrist    Fall Risk: Fall Risk  12/21/2019 12/10/2019 10/21/2019 09/21/2019 09/01/2019  Falls in the past year? 1 0 1 1 0  Comment - - - - -  Number falls in past yr: 1 0 1 0 0  Injury with Fall? 1 0 1 1 0  Comment broken vertebra - - - -  Risk for fall due to : History of fall(s);Impaired balance/gait - - - -  Follow up - - - - Falls evaluation completed    Functional Status Survey: Is the patient deaf or have difficulty hearing?: No Does the patient have difficulty seeing, even when wearing glasses/contacts?: No Does the patient have difficulty concentrating, remembering, or making decisions?: Yes Does the patient have difficulty walking or climbing stairs?: Yes Does the patient have difficulty dressing or bathing?: Yes(Shower only) Does the patient have difficulty doing errands alone such as visiting a doctor's office or shopping?: No    Assessment & Plan  1. Left lower quadrant abdominal pain  - CBC with Differential/Platelet; Future - Comprehensive metabolic panel; Future - CT Abdomen Pelvis Wo Contrast; Future  2. Nausea  - BASIC METABOLIC PANEL WITH GFR - CBC with Differential/Platelet; Future - Comprehensive metabolic panel; Future - CT Abdomen Pelvis Wo Contrast; Future  3. Lack of appetite  - BASIC METABOLIC PANEL WITH GFR - CBC with Differential/Platelet; Future -  Comprehensive metabolic panel; Future - CT Abdomen Pelvis Wo Contrast; Future  4. 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 History of diverticulitis  - CBC with Differential/Platelet; Future - Comprehensive metabolic panel; Future - CT Abdomen Pelvis Wo Contrast; Future

## 2019-12-22 ENCOUNTER — Ambulatory Visit: Payer: Medicare Other

## 2019-12-22 ENCOUNTER — Encounter: Payer: Self-pay | Admitting: Family Medicine

## 2019-12-22 ENCOUNTER — Other Ambulatory Visit: Payer: Self-pay | Admitting: Family Medicine

## 2019-12-22 DIAGNOSIS — R1032 Left lower quadrant pain: Secondary | ICD-10-CM

## 2019-12-22 DIAGNOSIS — N83202 Unspecified ovarian cyst, left side: Secondary | ICD-10-CM

## 2019-12-22 DIAGNOSIS — R110 Nausea: Secondary | ICD-10-CM

## 2019-12-22 MED ORDER — AMOXICILLIN-POT CLAVULANATE 875-125 MG PO TABS: 1.0000 | ORAL_TABLET | Freq: Two times a day (BID) | ORAL | 0 refills | Status: DC

## 2019-12-27 ENCOUNTER — Other Ambulatory Visit: Payer: Self-pay | Admitting: Family Medicine

## 2019-12-27 DIAGNOSIS — R748 Abnormal levels of other serum enzymes: Secondary | ICD-10-CM

## 2019-12-27 DIAGNOSIS — E871 Hypo-osmolality and hyponatremia: Secondary | ICD-10-CM

## 2019-12-28 DIAGNOSIS — F3132 Bipolar disorder, current episode depressed, moderate: Secondary | ICD-10-CM | POA: Diagnosis not present

## 2019-12-28 DIAGNOSIS — F411 Generalized anxiety disorder: Secondary | ICD-10-CM | POA: Diagnosis not present

## 2019-12-29 ENCOUNTER — Other Ambulatory Visit: Payer: Self-pay | Admitting: Family Medicine

## 2019-12-29 ENCOUNTER — Encounter: Payer: Self-pay | Admitting: Family Medicine

## 2019-12-29 MED ORDER — GABAPENTIN 300 MG PO CAPS: 300.0000 mg | ORAL_CAPSULE | Freq: Every day | ORAL | 1 refills | Status: AC

## 2019-12-30 DIAGNOSIS — R748 Abnormal levels of other serum enzymes: Secondary | ICD-10-CM | POA: Diagnosis not present

## 2019-12-30 DIAGNOSIS — M4325 Fusion of spine, thoracolumbar region: Secondary | ICD-10-CM | POA: Diagnosis not present

## 2019-12-30 DIAGNOSIS — E871 Hypo-osmolality and hyponatremia: Secondary | ICD-10-CM | POA: Diagnosis not present

## 2019-12-30 LAB — COMPLETE METABOLIC PANEL WITH GFR
AG Ratio: 1.4 (calc) (ref 1.0–2.5)
ALT: 7 U/L (ref 6–29)
AST: 15 U/L (ref 10–35)
Albumin: 4.3 g/dL (ref 3.6–5.1)
Alkaline phosphatase (APISO): 137 U/L (ref 37–153)
BUN/Creatinine Ratio: 24 (calc) — ABNORMAL HIGH (ref 6–22)
BUN: 24 mg/dL (ref 7–25)
CO2: 26 mmol/L (ref 20–32)
Calcium: 9.5 mg/dL (ref 8.6–10.4)
Chloride: 102 mmol/L (ref 98–110)
Creat: 1.01 mg/dL — ABNORMAL HIGH (ref 0.50–0.99)
GFR, Est African American: 67 mL/min/{1.73_m2} (ref >=60)
GFR, Est Non African American: 58 mL/min/{1.73_m2} — ABNORMAL LOW (ref >=60)
Globulin: 3.1 g/dL (calc) (ref 1.9–3.7)
Glucose, Bld: 96 mg/dL (ref 65–99)
Potassium: 4.8 mmol/L (ref 3.5–5.3)
Sodium: 139 mmol/L (ref 135–146)
Total Bilirubin: 0.2 mg/dL (ref 0.2–1.2)
Total Protein: 7.4 g/dL (ref 6.1–8.1)

## 2020-01-03 ENCOUNTER — Ambulatory Visit: Admission: RE | Admit: 2020-01-03 | Payer: Medicare Other | Source: Ambulatory Visit

## 2020-01-05 ENCOUNTER — Encounter: Payer: Self-pay | Admitting: Family Medicine

## 2020-01-11 DIAGNOSIS — F411 Generalized anxiety disorder: Secondary | ICD-10-CM | POA: Diagnosis not present

## 2020-01-11 DIAGNOSIS — F3132 Bipolar disorder, current episode depressed, moderate: Secondary | ICD-10-CM | POA: Diagnosis not present

## 2020-01-18 ENCOUNTER — Ambulatory Visit
Admission: RE | Admit: 2020-01-18 | Discharge: 2020-01-18 | Disposition: A | Discharge status: Home / Self Care | Payer: Medicare Other | Source: Ambulatory Visit | Attending: Family Medicine | Admitting: Family Medicine

## 2020-01-18 ENCOUNTER — Encounter: Payer: Self-pay | Admitting: Family Medicine

## 2020-01-18 ENCOUNTER — Other Ambulatory Visit: Payer: Self-pay

## 2020-01-18 DIAGNOSIS — N83202 Unspecified ovarian cyst, left side: Secondary | ICD-10-CM | POA: Insufficient documentation

## 2020-01-19 MED ORDER — CLOPIDOGREL BISULFATE 75 MG PO TABS: 75.0000 mg | ORAL_TABLET | Freq: Every day | ORAL | 3 refills | Status: AC

## 2020-01-19 MED ORDER — MECLIZINE HCL 25 MG PO TABS: 25.0000 mg | ORAL_TABLET | Freq: Every day | ORAL | 0 refills | Status: DC | PRN

## 2020-01-20 ENCOUNTER — Telehealth: Payer: Self-pay | Admitting: Family Medicine

## 2020-01-20 NOTE — Telephone Encounter (Signed)
[facility] called about a drug interaction  Dr. Ancil Boozer prescribed clopidogrel (PLAVIX) 75 MG tablet   but Pt also has a script from another provider for celecoxib (CELEBREX) 100 MG capsule And this can make the bleed risk higher with the Plavix / please confirm if Pt is taking Celebrex anymore

## 2020-02-01 ENCOUNTER — Institutional Professional Consult (permissible substitution): Payer: Medicare Other | Admitting: Adult Health

## 2020-02-01 ENCOUNTER — Telehealth: Payer: Self-pay | Admitting: Adult Health

## 2020-02-01 NOTE — Telephone Encounter (Signed)
Pt was scheduled for sleep consult on 02/01/2020 at 11:30, pt showed up at 12:09.  After discussion with Rexene Edison, NP I offered to work pt back into the afternoon schedule. Pt declined, as we were unable to provide her with an exact time that we could see her.  Pt wished to reschedule. Pt was offered an appt at [facility] office and declined, as we do not currently have any availability. Advised pt that it would be August before we could reschedule, pt was agreeable with this.  Will hold message in triage to contact pt once NP's schedules are open for August.

## 2020-02-02 ENCOUNTER — Other Ambulatory Visit: Payer: Self-pay

## 2020-02-02 ENCOUNTER — Ambulatory Visit (INDEPENDENT_AMBULATORY_CARE_PROVIDER_SITE_OTHER): Payer: Medicare Other

## 2020-02-02 DIAGNOSIS — E538 Deficiency of other specified B group vitamins: Secondary | ICD-10-CM

## 2020-02-02 NOTE — Telephone Encounter (Signed)
Lm for pt to schedule sleep consult with NP.

## 2020-02-02 NOTE — Telephone Encounter (Signed)
Spoke w/ pt in regards to rescheduling her sleep consult, but she will be out of town for most of August and September. She states that she will give [place] a call back to r/s appt once she is back in town.

## 2020-02-03 ENCOUNTER — Ambulatory Visit (INDEPENDENT_AMBULATORY_CARE_PROVIDER_SITE_OTHER): Payer: Medicare Other | Admitting: Podiatry

## 2020-02-03 ENCOUNTER — Other Ambulatory Visit: Payer: Self-pay

## 2020-02-03 ENCOUNTER — Encounter: Payer: Self-pay | Admitting: Podiatry

## 2020-02-03 DIAGNOSIS — L603 Nail dystrophy: Secondary | ICD-10-CM

## 2020-02-03 DIAGNOSIS — D689 Coagulation defect, unspecified: Secondary | ICD-10-CM

## 2020-02-03 DIAGNOSIS — E1142 Type 2 diabetes mellitus with diabetic polyneuropathy: Secondary | ICD-10-CM

## 2020-02-03 DIAGNOSIS — M47815 Spondylosis without myelopathy or radiculopathy, thoracolumbar region: Secondary | ICD-10-CM | POA: Diagnosis not present

## 2020-02-03 NOTE — Telephone Encounter (Signed)
Will close encounter

## 2020-02-03 NOTE — Progress Notes (Signed)
This patient presents the office with chief complaint of pain on her big toe nail bed right foot.  She presents the office stating that she is having severe pain noted walking wearing her shoes.  She points to the center of the nailbed of the right hallux at the site of her pain.  She states this is painful walking and wearing her shoes.  She presents the office today for an evaluation and treatment.  She was previously evaluated in October and I believe she had a nail spicule that kept growing from the nailbed right hallux but there is no evidence of a nail spicule today.  Patient has history of CKD and diabetes.    Vascular  Dorsalis pedis and posterior tibial pulses are palpable  B/L.  Capillary return  WNL.  Temperature gradient is  WNL.  Skin turgor  WNL  Sensorium  Senn Weinstein monofilament wire  absent  B/L. Marland Kitchen Normal tactile sensation.  Nail Exam  Patient has normal nails with no evidence of bacterial or fungal infection toes 2-5  B/L.  Absent nail plate  Hallux  B/L.  Orthopedic  Exam  Muscle tone and muscle strength  WNL.  No limitations of motion feet  B/L.  No crepitus or joint effusion noted.  Foot type is unremarkable and digits show no abnormalities.  Bony prominences are unremarkable.  Skin  No open lesions.  Normal skin texture and turgor. There is fibrotic tissue  noted in the center of right nail bed.  Nail dystrophy  Right hallux.  Diabetes with neuropathy.   ROV discussed this condition with this patient.  Used a dermal tool to de bride the hard callus/nail over the fibrotic site right nail bed.  Upon leaving she said she was pain-free.  Dispense to padding.  RTC 4 months.   Gardiner Barefoot DPM

## 2020-02-04 ENCOUNTER — Encounter: Payer: Self-pay | Admitting: Family Medicine

## 2020-02-04 ENCOUNTER — Telehealth (INDEPENDENT_AMBULATORY_CARE_PROVIDER_SITE_OTHER): Payer: Medicare Other | Admitting: Family Medicine

## 2020-02-04 VITALS — BP 149/75 | HR 83 | Ht 65.0 in | Wt 183.0 lb

## 2020-02-04 DIAGNOSIS — N390 Urinary tract infection, site not specified: Secondary | ICD-10-CM

## 2020-02-04 DIAGNOSIS — R109 Unspecified abdominal pain: Secondary | ICD-10-CM | POA: Diagnosis not present

## 2020-02-04 DIAGNOSIS — R1032 Left lower quadrant pain: Secondary | ICD-10-CM

## 2020-02-04 DIAGNOSIS — R110 Nausea: Secondary | ICD-10-CM | POA: Diagnosis not present

## 2020-02-04 LAB — POCT URINALYSIS DIPSTICK
Bilirubin, UA: NEGATIVE
Blood, UA: NEGATIVE
Glucose, UA: NEGATIVE
Ketones, UA: NEGATIVE
Leukocytes, UA: NEGATIVE
Nitrite, UA: NEGATIVE
Protein, UA: NEGATIVE
Spec Grav, UA: 1.015 (ref 1.010–1.025)
Urobilinogen, UA: 0.2 E.U./dL
pH, UA: 5 (ref 5.0–8.0)

## 2020-02-04 MED ORDER — AMOXICILLIN-POT CLAVULANATE 875-125 MG PO TABS: 1.0000 | ORAL_TABLET | Freq: Two times a day (BID) | ORAL | 0 refills | Status: DC

## 2020-02-04 MED ORDER — SULFAMETHOXAZOLE-TRIMETHOPRIM 800-160 MG PO TABS: 1.0000 | ORAL_TABLET | Freq: Two times a day (BID) | ORAL | 0 refills | Status: AC

## 2020-02-04 NOTE — Progress Notes (Signed)
Name: Victoria Austin   MRN: [id number]    DOB: 10/30/1950   Date:02/04/2020       Progress Note  Subjective:    Chief Complaint  Chief Complaint  Patient presents with  . Urinary Tract Infection    pain for 2 weeks around back and kidneys, saw back doctor and was told to get checked out.  No other symptoms    I connected with  [name]  on 02/04/20 at 11:20 AM EDT by a video enabled telemedicine application and verified that I am speaking with the correct person using two identifiers.  I discussed the limitations of evaluation and management by telemedicine and the availability of in person appointments. The patient expressed understanding and agreed to proceed. Staff also discussed with the patient that there may be a patient responsible charge related to this service. Patient Location: home Provider Location: [facility] Additional Individuals present: none  Flank pain/back pain, checked out by her ortho/spine surgeon and advised that she get checked for flank pain/kidney's "checked out" She has flank pain for 1-2 weeks, has some older RLQ discomfort, she has hx of incontinence, recurrent UTIs and is on trimethoprim to prevent UTI, at one point had indwelling catheter for a year (w/in the last 5 years).  She has not had change in urinary frequency, urgency, denies hematuria, urine odor, bladder pressure or fullness.    Urinary Tract Infection  This is a recurrent problem. The current episode started in the past 7 days. The problem has been unchanged. The pain is severe. There has been no fever. Associated symptoms include flank pain. Pertinent negatives include no chills, discharge, frequency, hematuria, hesitancy, nausea, possible pregnancy, sweats, urgency or vomiting. She has tried nothing for the symptoms. Her past medical history is significant for catheterization and recurrent UTIs. There is no history of kidney stones.   She has unchanged LLQ pain/pressure that Dr. Ancil Boozer has  worked up before, this is unchanged.  She states they did a pelvic US and she was put on antibiotics - when taking augmentin her pain was improving, but she was later told to stop antibiotics, and pain returned.  I reviewed meds/US results and past OV notes with pt today  Patient Active Problem List   Diagnosis Date Noted  . Hypertension associated with diabetes ([place]) 12/21/2019  . History of back surgery 12/21/2019  . S/P lumbar fusion 11/03/2019  . Hereditary essential tremor 10/08/2019  . Polypharmacy 10/08/2019  . OSA on CPAP 09/01/2019  . Anticoagulant long-term use 09/01/2019  . DM type 2 with diabetic peripheral neuropathy ([place]) 08/26/2019  . Hyperparathyroidism due to renal insufficiency ([place]) 08/26/2019  . 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 Lumbar degenerative disc disease 06/08/2019  . Lumbar facet arthropathy 06/08/2019  . Chronic pain syndrome 06/08/2019  . Chronic kidney disease due to type 2 diabetes mellitus ([place]) 06/07/2019  . Benign essential HTN 04/15/2019  . Constipation 04/13/2019  . Vertigo 04/13/2019  . Hypertriglyceridemia 04/13/2019  . 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 Diabetic neuropathy ([place]) 02/15/2019  . History of CVA (cerebrovascular accident) 01/08/2019  . Incontinence   . RA (rheumatoid arthritis) ([place])   . History of temporal arteritis 01/01/2019  . Albuminuria 12/18/2018  . Dyslipidemia associated with type 2 diabetes mellitus ([place]) 12/11/2018  . 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 Anxiety   . Diverticulitis 05/05/2018  . Fibromyalgia 03/21/2016  . Irritable bowel syndrome 09/19/2014  . 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 GERD (gastroesophageal reflux disease) 04/03/2011  .  Hiatal hernia 01/12/2007  . Bipolar 1 disorder ([place]) 07/29/2004    Social History   Tobacco Use  . Smoking status: Never Smoker  . Smokeless tobacco: Never Used  Substance Use Topics  . Alcohol use: Never     Current Outpatient Medications:  .   oxyCODONE (OXY IR/ROXICODONE) 5 MG immediate release tablet, Take by mouth., Disp: , Rfl:  .  acetaminophen (TYLENOL) 325 MG tablet, Take 2 tablets (650 mg total) by mouth every 4 (four) hours as needed for mild pain ((score 1 to 3) or temp > 100.5)., Disp: 60 tablet, Rfl: 0 .  ADVAIR HFA 230-21 MCG/ACT inhaler, Inhale 1 puff into the lungs 2 (two) times daily. (Patient taking differently: Inhale 1 puff into the lungs 2 (two) times daily as needed. ), Disp: 1 Inhaler, Rfl: 3 .  albuterol (PROAIR HFA) 108 (90 Base) MCG/ACT inhaler, Inhale 1 puff into the lungs 2 (two) times daily as needed., Disp: 18 g, Rfl: 1 .  alendronate (FOSAMAX) 70 MG tablet, Take 70 mg by mouth once a week. , Disp: , Rfl:  .  amoxicillin-clavulanate (AUGMENTIN) 875-125 MG tablet, Take 1 tablet by mouth 2 (two) times daily., Disp: 20 tablet, Rfl: 0 .  carbamazepine (TEGRETOL) 200 MG tablet, Take 400 mg by mouth 3 (three) times daily. Brianne Klinger,OIC , Disp: , Rfl:  .  celecoxib (CELEBREX) 100 MG capsule, Take 1 capsule (100 mg total) by mouth 2 (two) times daily., Disp: 60 capsule, Rfl: 0 .  cloNIDine (CATAPRES) 0.1 MG tablet, Take 1 tablet (0.1 mg total) by mouth daily as needed (blood pressure >180/100)., Disp: 60 tablet, Rfl: 3 .  clopidogrel (PLAVIX) 75 MG tablet, Take 1 tablet (75 mg total) by mouth daily., Disp: 90 tablet, Rfl: 3 .  Continuous Blood Gluc Receiver (FREESTYLE LIBRE 14 DAY READER) DEVI, 1 Device by Does not apply route 4 (four) times daily., Disp: 1 Device, Rfl: 0 .  Continuous Blood Gluc Sensor (FREESTYLE LIBRE 14 DAY SENSOR) MISC, 1 Device by Does not apply route every 14 (fourteen) days., Disp: 6 each, Rfl: 1 .  cyanocobalamin (,VITAMIN B-12,) 1000 MCG/ML injection, Inject 1,000 mcg into the muscle every 30 (thirty) days. , Disp: , Rfl:  .  docusate sodium (COLACE) 250 MG capsule, Take 250 mg by mouth daily. , Disp: , Rfl:  .  DULoxetine (CYMBALTA) 60 MG capsule, Take 90 mg by mouth daily. , Disp: , Rfl:   .  empagliflozin (JARDIANCE) 10 MG TABS tablet, Take 10 mg by mouth daily., Disp: , Rfl:  .  EPINEPHrine (EPIPEN 2-PAK) 0.3 mg/0.3 mL IJ SOAJ injection, Inject 0.3 mLs (0.3 mg total) into the muscle as needed for anaphylaxis. Then call 911 for ER transport., Disp: 0.3 mL, Rfl: 1 .  gabapentin (NEURONTIN) 300 MG capsule, Take 1 capsule (300 mg total) by mouth at bedtime., Disp: 90 capsule, Rfl: 1 .  hydrALAZINE (APRESOLINE) 50 MG tablet, Take 1 tablet (50 mg total) by mouth 3 (three) times daily., Disp: 270 tablet, Rfl: 3 .  Icosapent Ethyl 1 g CAPS, Take 2 capsules (2 g total) by mouth 2 (two) times daily., Disp: 360 capsule, Rfl: 3 .  Insulin Glargine (LANTUS SOLOSTAR) 100 UNIT/ML Solostar Pen, Inject 85 Units into the skin daily. (Patient taking differently: Inject 85 Units into the skin at bedtime. ), Disp: 10 pen, Rfl: 3 .  lisinopril (ZESTRIL) 5 MG tablet, Take 1 tablet (5 mg total) by mouth daily., Disp: 90 tablet, Rfl: 3 .  loratadine (CLARITIN) 10 MG tablet, Take 1 tablet (10 mg total) by mouth daily., Disp: 90 tablet, Rfl: 1 .  LORazepam (ATIVAN) 1 MG tablet, Take 1 mg by mouth daily as needed for anxiety or sleep. , Disp: , Rfl:  .  meclizine (ANTIVERT) 25 MG tablet, Take 1 tablet (25 mg total) by mouth daily as needed for dizziness. At most once daily , must last 90 days, Disp: 90 tablet, Rfl: 0 .  methocarbamol (ROBAXIN) 500 MG tablet, Take 2 tablets (1,000 mg total) by mouth every 6 (six) hours., Disp: 80 tablet, Rfl: 0 .  mirabegron ER (MYRBETRIQ) 50 MG TB24 tablet, Take 1 tablet (50 mg total) by mouth daily. (Patient taking differently: Take 100 mg by mouth daily. ), Disp: 90 tablet, Rfl: 3 .  montelukast (SINGULAIR) 10 MG tablet, Take 1 tablet (10 mg total) by mouth daily., Disp: 90 tablet, Rfl: 0 .  NIFEdipine (ADALAT CC) 60 MG 24 hr tablet, Take 1 tablet (60 mg total) by mouth 2 (two) times daily., Disp: 180 tablet, Rfl: 1 .  NOVOLOG FLEXPEN 100 UNIT/ML FlexPen, Inject 22-34 Units  into the skin 3 (three) times daily. With meals, Disp: 3 pen, Rfl: 3 .  pantoprazole (PROTONIX) 40 MG tablet, Take 1 tablet (40 mg total) by mouth daily., Disp: 90 tablet, Rfl: 1 .  primidone (MYSOLINE) 50 MG tablet, Take 4 tablets (200 mg total) by mouth at bedtime., Disp: , Rfl:  .  promethazine (PHENERGAN) 25 MG tablet, Take 1 tablet (25 mg total) by mouth every 6 (six) hours as needed., Disp: 30 tablet, Rfl: 1 .  propranolol (INDERAL) 10 MG tablet, Take 1 tablet (10 mg total) by mouth 2 (two) times daily. (Patient taking differently: Take 10 mg by mouth daily. ), Disp: 180 tablet, Rfl: 1 .  rosuvastatin (CRESTOR) 20 MG tablet, Take 1 tablet (20 mg total) by mouth daily., Disp: 90 tablet, Rfl: 3 .  SURE COMFORT PEN NEEDLES 31G X 5 MM MISC, Inject 1 pen as directed 5 (five) times daily as needed., Disp: 15 each, Rfl: 0 .  trimethoprim (TRIMPEX) 100 MG tablet, Take 1 tablet (100 mg total) by mouth daily., Disp: 90 tablet, Rfl: 3 .  Vitamin D, Ergocalciferol, (DRISDOL) 1.25 MG (50000 UNIT) CAPS capsule, Take 1 capsule (50,000 Units total) by mouth once a week., Disp: 12 capsule, Rfl: 1  Current Facility-Administered Medications:  .  cyanocobalamin ((VITAMIN B-12)) injection 1,000 mcg, 1,000 mcg, Intramuscular, Q30 days, Steele Sizer, MD, 1,000 mcg at 02/02/20 1049  Allergies  Allergen Reactions  . [employer] Anaphylaxis    BEE STINGS ALSO  . Ciprofloxacin Nausea And Vomiting  . Cyclobenzaprine Nausea And Vomiting  . Diazepam Nausea And Vomiting  . Ketorolac     HALLUCINATIONS  . Ondansetron Nausea And Vomiting    ABRUPT VOMITTING  . Ranitidine Hcl     FEELS LIKE BUGS CRAWLING ALL OVER HER  . Ambien [Zolpidem] Other (See Comments)    Severely sedating  . Bacitracin-Polymyxin B     BURNS SKIN  . Mushroom Extract Complex     BELL PEPPERS TOO CAUSING ITCHING  . Neomycin-Polymyxin-Gramicidin Rash    BURNS SKIN    I personally reviewed active problem list, medication list, allergies,  family history, social history, health maintenance, notes from last encounter, lab results, imaging with the patient/caregiver today.   Review of Systems  Constitutional: Positive for appetite change. Negative for activity change, chills and diaphoresis.  HENT: Negative.   Eyes: Negative.  Respiratory: Negative.   Cardiovascular: Negative.   Gastrointestinal: Negative.  Negative for nausea and vomiting.  Endocrine: Negative.   Genitourinary: Positive for flank pain. Negative for frequency, hematuria, hesitancy and urgency.  Skin: Negative.   Allergic/Immunologic: Negative.   Neurological: Negative.   Hematological: Negative.   Psychiatric/Behavioral: Negative.   All other systems reviewed and are negative.   Objective:   Virtual encounter, vitals limited, only able to obtain the following Today's Vitals   02/04/20 1050 02/04/20 1053  BP:  (!) 149/75  Pulse:  83  Weight: 183 lb (83 kg)   Height: 5\' 5"  (1.651 m)   PainSc:  10-Worst pain ever   Body mass index is 30.45 kg/m. Nursing Note and Vital Signs reviewed.  Physical Exam Vitals and nursing note reviewed.  Constitutional:      General: She is not in acute distress.    Appearance: Normal appearance. She is well-developed. She is not ill-appearing, toxic-appearing or diaphoretic.  HENT:     Head: Normocephalic and atraumatic.  Neck:     Trachea: No tracheal deviation.  Cardiovascular:     Rate and Rhythm: Normal rate.  Pulmonary:     Effort: Pulmonary effort is normal. No respiratory distress.     Breath sounds: No stridor.  Skin:    Coloration: Skin is not jaundiced or pale.     Findings: No rash.  Neurological:     Mental Status: She is alert.  Psychiatric:        Mood and Affect: Mood normal.        Behavior: Behavior normal.     PE limited by telephone encounter  4:53 PM Results for orders placed or performed in visit on 02/04/20  POCT urinalysis dipstick  Result Value Ref Range   Color, UA gold     Clarity, UA clear    Glucose, UA Negative Negative   Bilirubin, UA negative    Ketones, UA negative    Spec Grav, UA 1.015 1.010 - 1.025   Blood, UA negative    pH, UA 5.0 5.0 - 8.0   Protein, UA Negative Negative   Urobilinogen, UA 0.2 0.2 or 1.0 E.U./dL   Nitrite, UA negative    Leukocytes, UA Negative Negative   Appearance clear    Odor strong    Pt came in later in the day to give urine sample - very strong odor per [facility] Dip reviewed and urine culture sent      No results found for this or any previous visit (from the past 72 hour(s)).  Assessment and Plan:     ICD-10-CM   1. Flank pain  R10.9 Urine Culture    POCT urinalysis dipstick   hx of complicated UTI, incontinence - back/flank pain, recent back surgery, surgeon evaluated pt and suggested she be seen to evaluate kidneys  2. Recurrent UTI  N39.0 Urine Culture    POCT urinalysis dipstick   complicated urinary hx, currently on trimethoprim, less UTIs since starting with urology - flank pain?  test urine to see if concerning for UTI    Urine dip unremarkable.   Reviewed pt's recent imaging - she complains of LLQ pressure/discomfort Findings from most recent pelvic US: IMPRESSION: Two adjacent small simple appearing cysts in the LEFT adnexa likely of LEFT ovarian origin measuring 1.8 cm and 1.6 cm in greatest dimensions.  These have benign characteristics and are a common finding in postmenopausal females. No imaging follow up is required  Sx today may be secondary  to her chronic back pain and surgery? Although she was seen yesterday and they wanted her "kidney's checked out".  She has not been wearing postop brace that correct amount of time she states, they did not mention if this could be related to her pain?  difficult w/o physical exam today to tell if more suspicious for MSK pain, pyelonephritis/UTI? Vs Hydronephrosis/nephrolithiasis?   No urinary sx except odor, urine was malodorous today and noted to  be dark in color  Culture pending.  Tx empirically per pt preference and with hx of recurrent UTI and adjust per culture, encouraged to f/up if not improving.    She felt good with last abx sent in - bactrim per last micro and with med allergies  -Red flags and when to present for emergency care or RTC including fever >101.10F, chest pain, shortness of breath, new/worsening/un-resolving symptoms, reviewed with patient at time of visit. Follow up and care instructions discussed and provided in AVS. - I discussed the assessment and treatment plan with the patient. The patient was provided an opportunity to ask questions and all were answered. The patient agreed with the plan and demonstrated an understanding of the instructions.  I provided 20 minutes of non-face-to-face time during this encounter.  Delsa Grana, PA-C 02/04/20 11:40 AM

## 2020-02-05 LAB — URINE CULTURE
MICRO NUMBER:: 10686591
SPECIMEN QUALITY:: ADEQUATE

## 2020-02-09 DIAGNOSIS — F411 Generalized anxiety disorder: Secondary | ICD-10-CM | POA: Diagnosis not present

## 2020-02-09 DIAGNOSIS — F3132 Bipolar disorder, current episode depressed, moderate: Secondary | ICD-10-CM | POA: Diagnosis not present

## 2020-02-25 ENCOUNTER — Encounter: Payer: Self-pay | Admitting: Family Medicine

## 2020-02-25 ENCOUNTER — Other Ambulatory Visit: Payer: Self-pay | Admitting: Family Medicine

## 2020-02-25 DIAGNOSIS — R42 Dizziness and giddiness: Secondary | ICD-10-CM

## 2020-02-25 MED ORDER — NIFEDIPINE ER 60 MG PO TB24: 60.0000 mg | ORAL_TABLET | Freq: Two times a day (BID) | ORAL | 1 refills | Status: DC

## 2020-02-25 MED ORDER — MECLIZINE HCL 25 MG PO TABS: 25.0000 mg | ORAL_TABLET | Freq: Every day | ORAL | 0 refills | Status: DC | PRN

## 2020-03-02 ENCOUNTER — Encounter: Payer: Self-pay | Admitting: Family Medicine

## 2020-03-03 ENCOUNTER — Other Ambulatory Visit: Payer: Self-pay

## 2020-03-03 DIAGNOSIS — Z794 Long term (current) use of insulin: Secondary | ICD-10-CM

## 2020-03-03 DIAGNOSIS — E1165 Type 2 diabetes mellitus with hyperglycemia: Secondary | ICD-10-CM

## 2020-03-04 MED ORDER — LANTUS SOLOSTAR 100 UNIT/ML ~~LOC~~ SOPN: 85.0000 [IU] | PEN_INJECTOR | Freq: Every day | SUBCUTANEOUS | 0 refills | Status: DC

## 2020-03-09 ENCOUNTER — Other Ambulatory Visit: Payer: Self-pay

## 2020-03-09 ENCOUNTER — Encounter: Payer: Self-pay | Admitting: Family Medicine

## 2020-03-09 ENCOUNTER — Emergency Department
Admission: EM | Admit: 2020-03-09 | Discharge: 2020-03-09 | Disposition: A | Discharge status: Home / Self Care | Payer: Medicare Other | Attending: Emergency Medicine | Admitting: Emergency Medicine

## 2020-03-09 DIAGNOSIS — R109 Unspecified abdominal pain: Secondary | ICD-10-CM | POA: Insufficient documentation

## 2020-03-09 DIAGNOSIS — M545 Low back pain: Secondary | ICD-10-CM | POA: Diagnosis not present

## 2020-03-09 DIAGNOSIS — Z5321 Procedure and treatment not carried out due to patient leaving prior to being seen by health care provider: Secondary | ICD-10-CM | POA: Diagnosis not present

## 2020-03-09 LAB — CBC
HCT: 36.7 % (ref 36.0–46.0)
Hemoglobin: 11.8 g/dL — ABNORMAL LOW (ref 12.0–15.0)
MCH: 28 pg (ref 26.0–34.0)
MCHC: 32.2 g/dL (ref 30.0–36.0)
MCV: 87 fL (ref 80.0–100.0)
Platelets: 233 10*3/uL (ref 150–400)
RBC: 4.22 MIL/uL (ref 3.87–5.11)
RDW: 15.5 % (ref 11.5–15.5)
WBC: 7.2 10*3/uL (ref 4.0–10.5)
nRBC: 0 % (ref 0.0–0.2)

## 2020-03-09 LAB — URINALYSIS, COMPLETE (UACMP) WITH MICROSCOPIC
Bilirubin Urine: NEGATIVE
Glucose, UA: >=500 mg/dL — AB
Hgb urine dipstick: NEGATIVE
Ketones, ur: NEGATIVE mg/dL
Leukocytes,Ua: NEGATIVE
Nitrite: NEGATIVE
Protein, ur: 30 mg/dL — AB
Specific Gravity, Urine: 1.013 (ref 1.005–1.030)
Squamous Epithelial / HPF: NONE SEEN (ref 0–5)
WBC, UA: NONE SEEN WBC/hpf (ref 0–5)
pH: 6 (ref 5.0–8.0)

## 2020-03-09 LAB — BASIC METABOLIC PANEL
Anion gap: 12 (ref 5–15)
BUN: 18 mg/dL (ref 8–23)
CO2: 22 mmol/L (ref 22–32)
Calcium: 8.9 mg/dL (ref 8.9–10.3)
Chloride: 104 mmol/L (ref 98–111)
Creatinine, Ser: 1.08 mg/dL — ABNORMAL HIGH (ref 0.44–1.00)
GFR calc Af Amer: >60 mL/min (ref >60)
GFR calc non Af Amer: 53 mL/min — ABNORMAL LOW (ref >60)
Glucose, Bld: 200 mg/dL — ABNORMAL HIGH (ref 70–99)
Potassium: 3.4 mmol/L — ABNORMAL LOW (ref 3.5–5.1)
Sodium: 138 mmol/L (ref 135–145)

## 2020-03-09 NOTE — ED Triage Notes (Signed)
Pt complains of right sided flank pain for 5 days. Pt denies dysuria. Pt appears in no acute distress, denies fever.

## 2020-03-09 NOTE — ED Triage Notes (Signed)
First Nurse Note: C/O left lower back / flank pain x 5 days.  Denies dysuria./

## 2020-03-10 ENCOUNTER — Ambulatory Visit: Payer: Self-pay | Admitting: *Deleted

## 2020-03-10 NOTE — Telephone Encounter (Signed)
Severe left flank pain, went to ED last night, did not wait for treatment.  Reason for Disposition . [1] SEVERE pain (e.g., excruciating, scale 8-10) AND [2] present > 1 hour  Answer Assessment - Initial Assessment Questions 1. LOCATION: "Where does it hurt?" (e.g., left, right)     left 2. ONSET: "When did the pain start?"     5 days 3. SEVERITY: "How bad is the pain?" (e.g., Scale 1-10; mild, moderate, or severe)   - MILD (1-3): doesn't interfere with normal activities    - MODERATE (4-7): interferes with normal activities or awakens from sleep    - SEVERE (8-10): excruciating pain and patient unable to do normal activities (stays in bed)       Severe 4. PATTERN: "Does the pain come and go, or is it constant?"      positional 5. CAUSE: "What do you think is causing the pain?"     Went to [facility] then sent to ED. 6. OTHER SYMPTOMS:  "Do you have any other symptoms?" (e.g., fever, abdominal pain, vomiting, leg weakness, burning with urination, blood in urine)     No fever, urine feels warm, no blood, left leg weak, back surgery in April 7. PREGNANCY:  "Is there any chance you are pregnant?" "When was your last menstrual period?"     no  Protocols used: FLANK PAIN-A-AH  Pt has history of and symptoms of kidney stones. She went to ED and left after lab work due to she could not stay in chair because she was too uncomfortable. Advised that she needs to return to ED, she has no ride. Advised her to call ambulance for transfer. States she may take Tylenol and return in the morning. Instructed on Tylenol dose.

## 2020-03-11 ENCOUNTER — Other Ambulatory Visit: Payer: Self-pay

## 2020-03-11 ENCOUNTER — Emergency Department
Admission: EM | Admit: 2020-03-11 | Discharge: 2020-03-11 | Disposition: A | Discharge status: Home / Self Care | Payer: Medicare Other | Attending: Emergency Medicine | Admitting: Emergency Medicine

## 2020-03-11 ENCOUNTER — Emergency Department: Payer: Medicare Other

## 2020-03-11 DIAGNOSIS — Z8673 Personal history of transient ischemic attack (TIA), and cerebral infarction without residual deficits: Secondary | ICD-10-CM | POA: Insufficient documentation

## 2020-03-11 DIAGNOSIS — Z9049 Acquired absence of other specified parts of digestive tract: Secondary | ICD-10-CM | POA: Diagnosis not present

## 2020-03-11 DIAGNOSIS — Z79899 Other long term (current) drug therapy: Secondary | ICD-10-CM | POA: Diagnosis not present

## 2020-03-11 DIAGNOSIS — N200 Calculus of kidney: Secondary | ICD-10-CM | POA: Diagnosis not present

## 2020-03-11 DIAGNOSIS — E1140 Type 2 diabetes mellitus with diabetic neuropathy, unspecified: Secondary | ICD-10-CM | POA: Insufficient documentation

## 2020-03-11 DIAGNOSIS — Z794 Long term (current) use of insulin: Secondary | ICD-10-CM | POA: Insufficient documentation

## 2020-03-11 DIAGNOSIS — N183 Chronic kidney disease, stage 3 unspecified: Secondary | ICD-10-CM | POA: Diagnosis not present

## 2020-03-11 DIAGNOSIS — E1122 Type 2 diabetes mellitus with diabetic chronic kidney disease: Secondary | ICD-10-CM | POA: Diagnosis not present

## 2020-03-11 DIAGNOSIS — I700 Atherosclerosis of aorta: Secondary | ICD-10-CM | POA: Diagnosis not present

## 2020-03-11 DIAGNOSIS — K5730 Diverticulosis of large intestine without perforation or abscess without bleeding: Secondary | ICD-10-CM | POA: Diagnosis not present

## 2020-03-11 DIAGNOSIS — I129 Hypertensive chronic kidney disease with stage 1 through stage 4 chronic kidney disease, or unspecified chronic kidney disease: Secondary | ICD-10-CM | POA: Diagnosis not present

## 2020-03-11 DIAGNOSIS — R109 Unspecified abdominal pain: Secondary | ICD-10-CM | POA: Diagnosis present

## 2020-03-11 LAB — URINALYSIS, COMPLETE (UACMP) WITH MICROSCOPIC
Bacteria, UA: NONE SEEN
Bilirubin Urine: NEGATIVE
Glucose, UA: >=500 mg/dL — AB
Hgb urine dipstick: NEGATIVE
Ketones, ur: NEGATIVE mg/dL
Leukocytes,Ua: NEGATIVE
Nitrite: NEGATIVE
Protein, ur: 30 mg/dL — AB
Specific Gravity, Urine: 1.018 (ref 1.005–1.030)
pH: 5 (ref 5.0–8.0)

## 2020-03-11 LAB — CBC
HCT: 37 % (ref 36.0–46.0)
Hemoglobin: 12 g/dL (ref 12.0–15.0)
MCH: 27.8 pg (ref 26.0–34.0)
MCHC: 32.4 g/dL (ref 30.0–36.0)
MCV: 85.6 fL (ref 80.0–100.0)
Platelets: 225 10*3/uL (ref 150–400)
RBC: 4.32 MIL/uL (ref 3.87–5.11)
RDW: 15.2 % (ref 11.5–15.5)
WBC: 5.1 10*3/uL (ref 4.0–10.5)
nRBC: 0 % (ref 0.0–0.2)

## 2020-03-11 LAB — BASIC METABOLIC PANEL
Anion gap: 10 (ref 5–15)
BUN: 16 mg/dL (ref 8–23)
CO2: 22 mmol/L (ref 22–32)
Calcium: 8.6 mg/dL — ABNORMAL LOW (ref 8.9–10.3)
Chloride: 103 mmol/L (ref 98–111)
Creatinine, Ser: 0.92 mg/dL (ref 0.44–1.00)
GFR calc Af Amer: >60 mL/min (ref >60)
GFR calc non Af Amer: >60 mL/min (ref >60)
Glucose, Bld: 204 mg/dL — ABNORMAL HIGH (ref 70–99)
Potassium: 3.9 mmol/L (ref 3.5–5.1)
Sodium: 135 mmol/L (ref 135–145)

## 2020-03-11 MED ORDER — MELOXICAM 7.5 MG PO TABS
7.5000 mg | ORAL_TABLET | Freq: Every day | ORAL | 0 refills | Status: DC
Start: 2020-03-11 — End: 2020-05-08

## 2020-03-11 MED ORDER — OXYCODONE-ACETAMINOPHEN 5-325 MG PO TABS
1.0000 | ORAL_TABLET | Freq: Once | ORAL | Status: AC
  Administered 2020-03-11: 1 via ORAL
  Filled 2020-03-11: qty 1

## 2020-03-11 MED ORDER — OXYCODONE-ACETAMINOPHEN 5-325 MG PO TABS: 1.0000 | ORAL_TABLET | Freq: Four times a day (QID) | ORAL | 0 refills | Status: DC | PRN

## 2020-03-11 MED ORDER — TAMSULOSIN HCL 0.4 MG PO CAPS: 0.4000 mg | ORAL_CAPSULE | Freq: Every day | ORAL | 0 refills | Status: DC

## 2020-03-11 NOTE — ED Notes (Signed)
Repeat VS obtained by this RN. This RN apologized for and explained delay to patient.

## 2020-03-11 NOTE — ED Provider Notes (Signed)
[facility] Emergency Department Provider Note  ____________________________________________  Time seen: Approximately 2:48 PM  I have reviewed the triage vital signs and the nursing notes.   HISTORY  Chief Complaint Flank Pain    HPI Victoria Austin is a 69 y.o. female who presents the emergency department complaining of sharp right flank pain x7 days.  Patient states that the pain occasionally radiates into her right abdomen.  Localized typically with her right flank.  Some polyuria but no dysuria.  No hematuria.  No emesis, diarrhea or constipation.  No fevers or chills.  Patient has a very remote history of nephrolithiasis but does not remember what side or much about her symptoms.  Patient with a history of bipolar disorder, anxiety, CKD, depression, diabetes, fibromyalgia, GERD, IBS.         Past Medical History:  Diagnosis Date  . Anemia    vitamin d deficiency  . Anxiety    ER visit on 10/15/19 for panic attack, sob, shaking  . Arthritis   . Bipolar 1 disorder ([place]) 2006  . Chronic kidney disease    stage III  . Depression   . Diabetes mellitus without complication ([place])    on insulin.  . Fibromyalgia   . GERD (gastroesophageal reflux disease)   . Hereditary essential tremor   . History of hiatal hernia   . Hodgkin's lymphoma ([place]) 2014   SKIN CANCER ALSO  . Hyperlipidemia   . Hypertension   . IBS (irritable bowel syndrome)   . IBS (irritable bowel syndrome)   . Incontinence   . Neuropathy   . Osteopenia   . RA (rheumatoid arthritis) ([place])   . Scoliosis   . Sleep apnea    uses cpap  . Stroke [facility]) 2019   several tia's prior to stroke.  on plavix  . Temporal arteritis [facility])     Patient Active Problem List   Diagnosis Date Noted  . Hypertension associated with diabetes ([place]) 12/21/2019  . History of back surgery 12/21/2019  . S/P lumbar fusion 11/03/2019  . Hereditary essential tremor 10/08/2019  . Polypharmacy 10/08/2019  . OSA  on CPAP 09/01/2019  . Anticoagulant long-term use 09/01/2019  . DM type 2 with diabetic peripheral neuropathy ([place]) 08/26/2019  . Hyperparathyroidism due to renal insufficiency ([place]) 08/26/2019  . 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 Lumbar degenerative disc disease 06/08/2019  . Lumbar facet arthropathy 06/08/2019  . Chronic pain syndrome 06/08/2019  . Chronic kidney disease due to type 2 diabetes mellitus ([place]) 06/07/2019  . Benign essential HTN 04/15/2019  . Constipation 04/13/2019  . Vertigo 04/13/2019  . Hypertriglyceridemia 04/13/2019  . 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 Diabetic neuropathy ([place]) 02/15/2019  . History of CVA (cerebrovascular accident) 01/08/2019  . Incontinence   . RA (rheumatoid arthritis) ([place])   . History of temporal arteritis 01/01/2019  . Albuminuria 12/18/2018  . Dyslipidemia associated with type 2 diabetes mellitus ([place]) 12/11/2018  . 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 Anxiety   . Diverticulitis 05/05/2018  . Fibromyalgia 03/21/2016  . Irritable bowel syndrome 09/19/2014  . 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 GERD (gastroesophageal reflux disease) 04/03/2011  . Hiatal hernia 01/12/2007  . Bipolar 1 disorder ([place]) 07/29/2004    Past Surgical History:  Procedure Laterality Date  . BACK SURGERY    . CESAREAN SECTION    . ELBOW ARTHROSCOPY    . FINGER SURGERY    . HAND SURGERY    . HERNIA REPAIR  UMBILICAL REPAIR  . LAMINECTOMY WITH POSTERIOR LATERAL ARTHRODESIS LEVEL 4 N/A 11/03/2019   Procedure: T11-L3 POSTERIOR FUSION, T12-L2 POSTERIOR COLUMN OSTEOTOMIES;  Surgeon: Meade Maw, MD;  Location: [facility] ORS;  Service: Neurosurgery;  Laterality: N/A;  . SPINE SURGERY      Prior to Admission medications   Medication Sig Start Date End Date Taking? Authorizing Provider  albuterol (PROAIR HFA) 108 (90 Base) MCG/ACT inhaler Inhale 1 puff into the lungs 2 (two) times daily as needed. 09/21/19  Yes Delsa Grana, PA-C   alendronate (FOSAMAX) 70 MG tablet Take 70 mg by mouth once a week.  08/25/19 08/24/20 Yes [provider]  carbamazepine (TEGRETOL) 200 MG tablet Take 400 mg by mouth 3 (three) times daily. Brianne Klinger,OIC  04/27/18  Yes   cloNIDine (CATAPRES) 0.1 MG tablet Take 1 tablet (0.1 mg total) by mouth daily as needed (blood pressure >180/100). 09/21/19  Yes Delsa Grana, PA-C  clopidogrel (PLAVIX) 75 MG tablet Take 1 tablet (75 mg total) by mouth daily. 01/19/20  Yes Sowles, Drue Stager, MD  docusate sodium (COLACE) 100 MG capsule Take 100 mg by mouth daily.    Yes [provider]  DULoxetine (CYMBALTA) 30 MG capsule Take 90 mg by mouth daily. 03/03/20  Yes [provider]  DULoxetine (CYMBALTA) 60 MG capsule Take 60 mg by mouth daily.   Yes [provider]  empagliflozin (JARDIANCE) 25 MG TABS tablet Take 25 mg by mouth daily.    Yes [provider]  fluticasone-salmeterol (ADVAIR HFA) 230-21 MCG/ACT inhaler Inhale 2 puffs into the lungs 2 (two) times daily.   Yes [provider]  hydrALAZINE (APRESOLINE) 50 MG tablet Take 1 tablet (50 mg total) by mouth 3 (three) times daily. 10/08/19  Yes Delsa Grana, PA-C  insulin glargine (LANTUS) 100 UNIT/ML Solostar Pen Inject 60 Units into the skin at bedtime.   Yes [provider]  lisinopril (ZESTRIL) 5 MG tablet Take 1 tablet (5 mg total) by mouth daily. 10/26/19  Yes Delsa Grana, PA-C  loratadine (CLARITIN) 10 MG tablet Take 1 tablet (10 mg total) by mouth daily. 01/01/19  Yes Hubbard Hartshorn, FNP  LORazepam (ATIVAN) 1 MG tablet Take 1 mg by mouth daily as needed for anxiety or sleep.  06/15/18  Yes [provider]  meclizine (ANTIVERT) 25 MG tablet Take 1 tablet (25 mg total) by mouth daily as needed for dizziness. At most once daily , must last 90 days 02/25/20  Yes Sowles, Drue Stager, MD  methocarbamol (ROBAXIN) 500 MG tablet Take 2 tablets (1,000 mg total) by mouth every 6 (six) hours. Patient  taking differently: Take 1-2 tablets (500-1,000 mg total) by mouth 3 (three) times daily as needed (spasms) prn for muscle pain/spasms 11/08/19  Yes Marin Olp, PA-C  mirabegron ER (MYRBETRIQ) 50 MG TB24 tablet Take 1 tablet (50 mg total) by mouth daily. Patient taking differently: Take 100 mg by mouth daily.  09/03/19  Yes Billey Co, MD  NIFEdipine (ADALAT CC) 60 MG 24 hr tablet Take 1 tablet (60 mg total) by mouth 2 (two) times daily. 02/25/20  Yes Sowles, Drue Stager, MD  NOVOLOG FLEXPEN 100 UNIT/ML FlexPen Inject 22-34 Units into the skin 3 (three) times daily. With meals 10/08/19  Yes Delsa Grana, PA-C  pantoprazole (PROTONIX) 40 MG tablet Take 1 tablet (40 mg total) by mouth daily. 08/05/19  Yes Hubbard Hartshorn, FNP  promethazine (PHENERGAN) 25 MG tablet Take 1 tablet (25 mg total) by mouth every 6 (six) hours as  needed. 09/21/19  Yes Delsa Grana, PA-C  propranolol (INDERAL) 10 MG tablet Take 1 tablet (10 mg total) by mouth 2 (two) times daily. Patient taking differently: Take 10 mg by mouth daily.  08/05/19  Yes Hubbard Hartshorn, FNP  rosuvastatin (CRESTOR) 20 MG tablet Take 1 tablet (20 mg total) by mouth daily. 08/05/19  Yes Hubbard Hartshorn, FNP  trimethoprim (TRIMPEX) 100 MG tablet Take 1 tablet (100 mg total) by mouth daily. 09/03/19  Yes Billey Co, MD  Vitamin D, Ergocalciferol, (DRISDOL) 1.25 MG (50000 UNIT) CAPS capsule Take 1 capsule (50,000 Units total) by mouth once a week. 09/03/19  Yes Hubbard Hartshorn, FNP  acetaminophen (TYLENOL) 325 MG tablet Take 2 tablets (650 mg total) by mouth every 4 (four) hours as needed for mild pain ((score 1 to 3) or temp > 100.5). 11/08/19   Marin Olp, PA-C  celecoxib (CELEBREX) 100 MG capsule Take 1 capsule (100 mg total) by mouth 2 (two) times daily. 11/08/19   Marin Olp, PA-C  EPINEPHrine (EPIPEN 2-PAK) 0.3 mg/0.3 mL IJ SOAJ injection Inject 0.3 mLs (0.3 mg total) into the muscle as needed for anaphylaxis. Then call 911 for ER transport. 09/21/19    Delsa Grana, PA-C  gabapentin (NEURONTIN) 300 MG capsule Take 1 capsule (300 mg total) by mouth at bedtime. 12/29/19   Steele Sizer, MD  meloxicam (MOBIC) 7.5 MG tablet Take 1 tablet (7.5 mg total) by mouth daily. 03/11/20 03/11/21  Dupree Givler, Charline Bills, PA-C  montelukast (SINGULAIR) 10 MG tablet Take 1 tablet (10 mg total) by mouth daily. 01/01/19   Hubbard Hartshorn, FNP  oxyCODONE-acetaminophen (PERCOCET/ROXICET) 5-325 MG tablet Take 1 tablet by mouth every 6 (six) hours as needed. 03/11/20   Jaryiah Mehlman, Charline Bills, PA-C  tamsulosin (FLOMAX) 0.4 MG CAPS capsule Take 1 capsule (0.4 mg total) by mouth daily. 03/11/20   Hadlyn Amero, Charline Bills, PA-C    Allergies Fire ant, Ciprofloxacin, Cyclobenzaprine, Diazepam, Ketorolac, Ondansetron, Ranitidine hcl, Ambien [zolpidem], Bacitracin-polymyxin b, Mushroom extract complex, and Neomycin-polymyxin-gramicidin  Family History  Problem Relation Age of Onset  . Lung cancer Mother   . Hypertension Mother   . Hypertension Sister   . Hypertension Brother   . Bone cancer Maternal Aunt   . Colon cancer Maternal Uncle     Social History Social History   Tobacco Use  . Smoking status: Never Smoker  . Smokeless tobacco: Never Used  Vaping Use  . Vaping Use: Never used  Substance Use Topics  . Alcohol use: Never  . Drug use: Never     Review of Systems  Constitutional: No fever/chills Eyes: No visual changes. No discharge ENT: No upper respiratory complaints. Cardiovascular: no chest pain. Respiratory: no cough. No SOB. Gastrointestinal: No abdominal pain.  No nausea, no vomiting.  No diarrhea.  No constipation. Genitourinary: Negative for dysuria. No hematuria.  Positive for right flank pain and polyuria. Musculoskeletal: Negative for musculoskeletal pain. Skin: Negative for rash, abrasions, lacerations, ecchymosis. Neurological: Negative for headaches, focal weakness or numbness. 10-point ROS otherwise  negative.  ____________________________________________   PHYSICAL EXAM:  VITAL SIGNS: ED Triage Vitals  Enc Vitals Group     BP 03/11/20 1101 (!) 167/79     Pulse Rate 03/11/20 1101 94     Resp 03/11/20 1101 20     Temp 03/11/20 1101 98.3 F (36.8 C)     Temp Source 03/11/20 1101 Oral     SpO2 03/11/20 1101 99 %     Weight 03/11/20 1101  187 lb (84.8 kg)     Height 03/11/20 1101 5\' 4"  (1.626 m)     Head Circumference --      Peak Flow --      Pain Score 03/11/20 1114 7     Pain Loc --      Pain Edu? --      Excl. in [place]? --      Constitutional: Alert and oriented. Well appearing and in no acute distress. Eyes: Conjunctivae are normal. PERRL. EOMI. Head: Atraumatic. ENT:      Ears:       Nose: No congestion/rhinnorhea.      Mouth/Throat: Mucous membranes are moist.  Neck: No stridor.   Cardiovascular: Normal rate, regular rhythm. Normal S1 and S2.  Good peripheral circulation. Respiratory: Normal respiratory effort without tachypnea or retractions. Lungs CTAB. Good air entry to the bases with no decreased or absent breath sounds. Gastrointestinal: Bowel sounds 4 quadrants.  Soft to palpation all quadrants.  Mildly tender to palpation along the right lateral abdominal wall extending into the right lower quadrant.  No right upper quadrant tenderness.  No left-sided tenderness.. No guarding or rigidity. No palpable masses. No distention.  Right-sided CVA tenderness. Musculoskeletal: Full range of motion to all extremities. No gross deformities appreciated. Neurologic:  Normal speech and language. No gross focal neurologic deficits are appreciated.  Skin:  Skin is warm, dry and intact. No rash noted. Psychiatric: Mood and affect are normal. Speech and behavior are normal. Patient exhibits appropriate insight and judgement.   ____________________________________________   LABS (all labs ordered are listed, but only abnormal results are displayed)  Labs Reviewed   URINALYSIS, COMPLETE (UACMP) WITH MICROSCOPIC - Abnormal; Notable for the following components:      Result Value   Color, Urine YELLOW (*)    APPearance CLEAR (*)    Glucose, UA >=500 (*)    Protein, ur 30 (*)    All other components within normal limits  BASIC METABOLIC PANEL - Abnormal; Notable for the following components:   Glucose, Bld 204 (*)    Calcium 8.6 (*)    All other components within normal limits  CBC   ____________________________________________  EKG   ____________________________________________  RADIOLOGY I personally viewed and evaluated these images as part of my medical decision making, as well as reviewing the written report by the radiologist.  CT Renal Stone Study  Result Date: 03/11/2020 CLINICAL DATA:  Bilateral flank pain 3 days right worse than left. EXAM: CT ABDOMEN AND PELVIS WITHOUT CONTRAST TECHNIQUE: Multidetector CT imaging of the abdomen and pelvis was performed following the standard protocol without IV contrast. COMPARISON:  12/21/2019 FINDINGS: Lower chest: No acute findings in the lung bases. Known calcified granuloma over the left lower lobe partially visualized. Calcification of the mitral valve annulus. Hepatobiliary: Previous cholecystectomy. Liver and biliary tree are unremarkable. Pancreas: Normal. Spleen: Normal. Adrenals/Urinary Tract: Adrenal glands are normal. Kidneys are normal in size. Punctate nonobstructing stone over the lower pole right kidney. No evidence of hydronephrosis. Ureters and bladder are normal. Stomach/Bowel: Stomach and small bowel are within normal. Minimal diverticulosis of the colon. Appendix is normal. Vascular/Lymphatic: Mild calcification over the abdominal aorta which is normal in caliber. No adenopathy. Reproductive: Normal. Other: Evidence of previous midline ventral hernia repair. Surgical clips over the midline upper abdomen. No acute inflammatory change or free fluid. Musculoskeletal: Degenerative change of  the spine. Posterior fusion hardware intact from T12-L3. IMPRESSION: 1. No acute findings in the abdomen/pelvis. 2. Punctate nonobstructing  right renal stone. 3. Minimal colonic diverticulosis. 4. Aortic atherosclerosis. Aortic Atherosclerosis (ICD10-I70.0). Electronically Signed   By: Marin Olp M.D.   On: 03/11/2020 16:50    ____________________________________________    PROCEDURES  Procedure(s) performed:    Procedures    Medications  oxyCODONE-acetaminophen (PERCOCET/ROXICET) 5-325 MG per tablet 1 tablet (1 tablet Oral Given 03/11/20 1511)     ____________________________________________   INITIAL IMPRESSION / ASSESSMENT AND PLAN / ED COURSE  Pertinent labs & imaging results that were available during my care of the patient were reviewed by me and considered in my medical decision making (see chart for details).  Review of the [place] CSRS was performed in accordance of the [place] prior to dispensing any controlled drugs.           Patient's diagnosis is consistent with nephrolithiasis.  Patient presented to emergency department right flank pain.  Differential included pyelonephritis versus nephrolithiasis.  Imaging reveals nonobstructing nephrolithiasis on the right side.  Exam and labs are otherwise reassuring.  Stable for discharge at this time.. Patient will be discharged home with prescriptions for limited pain medication, Flomax, meloxicam. Patient is to follow up with urology as needed or otherwise directed. Patient is given ED precautions to return to the ED for any worsening or new symptoms.     ____________________________________________  FINAL CLINICAL IMPRESSION(S) / ED DIAGNOSES  Final diagnoses:  Nephrolithiasis      NEW MEDICATIONS STARTED DURING THIS VISIT:  ED Discharge Orders         Ordered    oxyCODONE-acetaminophen (PERCOCET/ROXICET) 5-325 MG tablet  Every 6 hours PRN     Discontinue  Reprint     03/11/20 1814    tamsulosin (FLOMAX) 0.4 MG  CAPS capsule  Daily     Discontinue  Reprint     03/11/20 1814    meloxicam (MOBIC) 7.5 MG tablet  Daily     Discontinue  Reprint     03/11/20 1814              This chart was dictated using voice recognition software/Dragon. Despite best efforts to proofread, errors can occur which can change the meaning. Any change was purely unintentional.    Darletta Moll, PA-C 03/11/20 1816    Nena Polio, MD 03/12/20 [phone]

## 2020-03-11 NOTE — ED Triage Notes (Signed)
Pt arrived via POV with c/o bilateral flank pain since Thursday, pt states the right side is worse than the left.  Pt states she was here on Friday, but left before being seen.  Pt states she has left side flank pain when she urinates.    Pt states she went home to take a pain pill and slept until yesterday at noon.  Pt has no hx of kidney stones that she can remember.

## 2020-03-11 NOTE — ED Notes (Signed)
Pt with right flank pain x 7 days. Pt was in ER waiting room 7 days ago but was not seen. Pt denies blood in her urine.

## 2020-03-13 ENCOUNTER — Encounter: Payer: Self-pay | Admitting: Family Medicine

## 2020-03-14 ENCOUNTER — Ambulatory Visit: Payer: Self-pay | Admitting: *Deleted

## 2020-03-14 NOTE — Telephone Encounter (Signed)
Patient is having nausea and she states her pain is not better. Patient pain level-6-7 and she is not wanting to take a lot of narcotic due to the constipation they cause. Patient is incontinent of urine and she states she is going more frequently- no fever, blood or discomfort with urination. Patient has been to ED(8/14) and she states she is diagnosed with kidney stones. Patient has not tried the phenergan for her nausea and she is going to do that now. Patient made appointment with her neprologist- but they do not handle kidney stones and she needs referral to urology. Patient advised : Will send note to PCP for further advise. Advised: if her pain increases,fever, blood in urine or decrease in urine output- go to ED.  Reason for Disposition . Nausea lasts > 1 week . [1] Sudden onset of severe flank pain AND [2] age > 60 years  Answer Assessment - Initial Assessment Questions 1. NAUSEA SEVERITY: "How bad is the nausea?" (e.g., mild, moderate, severe; dehydration, weight loss)   - MILD: loss of appetite without change in eating habits   - MODERATE: decreased oral intake without significant weight loss, dehydration, or malnutrition   - SEVERE: inadequate caloric or fluid intake, significant weight loss, symptoms of dehydration     Severe- not wanting to eat 2. ONSET: "When did the nausea begin?"     2weeks 3. VOMITING: "Any vomiting?" If Yes, ask: "How many times today?"     No vomiting 4. RECURRENT SYMPTOM: "Have you had nausea before?" If Yes, ask: "When was the last time?" "What happened that time?"     nausea related to kidney stones 5. CAUSE: "What do you think is causing the nausea?"     Pain in abdomen 6. PREGNANCY: "Is there any chance you are pregnant?" (e.g., unprotected intercourse, missed birth control pill, broken condom)     n/a  Answer Assessment - Initial Assessment Questions 1. LOCATION: "Where does it hurt?" (e.g., left, right)     Right 2. ONSET: "When did the pain  start?"     2 weeks 3. SEVERITY: "How bad is the pain?" (e.g., Scale 1-10; mild, moderate, or severe)   - MILD (1-3): doesn't interfere with normal activities    - MODERATE (4-7): interferes with normal activities or awakens from sleep    - SEVERE (8-10): excruciating pain and patient unable to do normal activities (stays in bed)       6-7 4. PATTERN: "Does the pain come and go, or is it constant?"      constant 5. CAUSE: "What do you think is causing the pain?"     Kidney stones- moving makes it worse 6. OTHER SYMPTOMS:  "Do you have any other symptoms?" (e.g., fever, abdominal pain, vomiting, leg weakness, burning with urination, blood in urine)     Leg weakness, nausea 7. PREGNANCY:  "Is there any chance you are pregnant?" "When was your last menstrual period?"     n/a  Protocols used: NAUSEA-A-AH, FLANK PAIN-A-AH

## 2020-03-14 NOTE — Telephone Encounter (Signed)
Summary: nausea    Patient requesting call back from RN to discuss ongoing nausea. She would like to discuss OTC treatment until appointment.      Attempted to call patient to discuss symptoms- left message to call office back.

## 2020-03-15 ENCOUNTER — Other Ambulatory Visit: Payer: Self-pay | Admitting: Family Medicine

## 2020-03-15 DIAGNOSIS — N200 Calculus of kidney: Secondary | ICD-10-CM

## 2020-03-15 DIAGNOSIS — R109 Unspecified abdominal pain: Secondary | ICD-10-CM

## 2020-03-17 DIAGNOSIS — R809 Proteinuria, unspecified: Secondary | ICD-10-CM | POA: Diagnosis not present

## 2020-03-17 DIAGNOSIS — Z794 Long term (current) use of insulin: Secondary | ICD-10-CM | POA: Diagnosis not present

## 2020-03-17 DIAGNOSIS — E1129 Type 2 diabetes mellitus with other diabetic kidney complication: Secondary | ICD-10-CM | POA: Diagnosis not present

## 2020-03-21 DIAGNOSIS — N200 Calculus of kidney: Secondary | ICD-10-CM | POA: Insufficient documentation

## 2020-03-22 ENCOUNTER — Ambulatory Visit: Payer: Medicare Other | Admitting: Family Medicine

## 2020-03-22 ENCOUNTER — Other Ambulatory Visit: Payer: Self-pay

## 2020-03-22 ENCOUNTER — Encounter: Payer: Self-pay | Admitting: Urology

## 2020-03-22 ENCOUNTER — Ambulatory Visit (INDEPENDENT_AMBULATORY_CARE_PROVIDER_SITE_OTHER): Payer: Medicare Other | Admitting: Urology

## 2020-03-22 VITALS — BP 149/76 | HR 85 | Ht 64.0 in | Wt 185.0 lb

## 2020-03-22 DIAGNOSIS — R109 Unspecified abdominal pain: Secondary | ICD-10-CM | POA: Diagnosis not present

## 2020-03-22 DIAGNOSIS — N390 Urinary tract infection, site not specified: Secondary | ICD-10-CM

## 2020-03-22 DIAGNOSIS — N200 Calculus of kidney: Secondary | ICD-10-CM

## 2020-03-22 DIAGNOSIS — N3281 Overactive bladder: Secondary | ICD-10-CM | POA: Diagnosis not present

## 2020-03-22 DIAGNOSIS — R351 Nocturia: Secondary | ICD-10-CM

## 2020-03-22 NOTE — Progress Notes (Signed)
   03/22/2020 9:43 AM   [name] 1951/07/12 [id number]  Reason for visit: Flank pain  HPI: I saw Ms. Wieand in follow-up today for flank pain and possible kidney stone.  She is a very comorbid 69 year old female who I last saw in October 2020 for urinary symptoms and recurrent UTIs.  She has a complex history with multiple back surgeries and chronic pain and fibromyalgia.  Her urinary symptoms have been extremely well managed with Myrbetriq 50 mg daily, and recurrent UTIs have been completely prevented on trimethoprim 100 mg daily.  She was seen in the ED last week with right-sided flank pain that radiated down the right leg.  Her pain is worse with standing and physical activity.  She denies any urinary symptoms.  A CT was performed showing a punctate right lower pole nonobstructing stone, and urinalysis was completely benign.  She was sent to urology for further follow-up and evaluation.  I personally reviewed her CT scan that shows no hydronephrosis and a punctate right lower pole stone.  I reviewed with her at length that her symptoms are not secondary to her non-obstructing punctate right lower pole stone, and sound more nerve related with the fact that it is worse with standing and physical activity, and radiates down the right leg.  I recommended follow-up with her neurosurgeon for consideration of an MRI for evaluation of her right-sided pain that radiates down the right leg.  Continue trimethoprim prophylaxis for recurrent UTIs Continue Myrbetriq for OAB RTC 6 months symptom check, consider discontinuing trimethoprim prophylaxis at that visit  Billey Co, [place] [address], [place], [place] [address] [phone]

## 2020-03-22 NOTE — Addendum Note (Signed)
Addended by: Despina Hidden on: 03/22/2020 11:35 AM   Modules accepted: Orders

## 2020-03-22 NOTE — Patient Instructions (Signed)
Follow-up with spine surgeon regarding your pain.

## 2020-03-30 ENCOUNTER — Encounter: Payer: Self-pay | Admitting: Family Medicine

## 2020-03-30 NOTE — Telephone Encounter (Signed)
Spoke with Victoria Austin and offered her that appt for 03-31-2020 at 1pm you approved but Marlis said that she is in [place] and will not be coming back that she is having to move to [place] in order to take care of everything for she is over it all. She has already got an appt with another primary care doctor there and she is just needing refills. Said thank you for everything. Refills can be sent to [facility] on [address] [facility] [place]. Said that you all had sent rx they before. Told her we would call her to let her know once you decided. Phone is [phone]

## 2020-03-31 ENCOUNTER — Encounter: Payer: Self-pay | Admitting: Family Medicine

## 2020-03-31 ENCOUNTER — Other Ambulatory Visit: Payer: Self-pay | Admitting: Family Medicine

## 2020-03-31 DIAGNOSIS — I2510 Atherosclerotic heart disease of native coronary artery without angina pectoris: Secondary | ICD-10-CM

## 2020-03-31 DIAGNOSIS — E781 Pure hyperglyceridemia: Secondary | ICD-10-CM

## 2020-03-31 DIAGNOSIS — Z794 Long term (current) use of insulin: Secondary | ICD-10-CM

## 2020-03-31 DIAGNOSIS — E782 Mixed hyperlipidemia: Secondary | ICD-10-CM

## 2020-03-31 DIAGNOSIS — E1165 Type 2 diabetes mellitus with hyperglycemia: Secondary | ICD-10-CM

## 2020-03-31 MED ORDER — LISINOPRIL 5 MG PO TABS: 5.0000 mg | ORAL_TABLET | Freq: Every day | ORAL | 0 refills | Status: DC

## 2020-03-31 MED ORDER — NIFEDIPINE ER 60 MG PO TB24: 60.0000 mg | ORAL_TABLET | Freq: Two times a day (BID) | ORAL | 0 refills | Status: DC

## 2020-03-31 MED ORDER — MECLIZINE HCL 25 MG PO TABS: 25.0000 mg | ORAL_TABLET | Freq: Every day | ORAL | 0 refills | Status: DC | PRN

## 2020-03-31 MED ORDER — ROSUVASTATIN CALCIUM 20 MG PO TABS: 20.0000 mg | ORAL_TABLET | Freq: Every day | ORAL | 0 refills | Status: DC

## 2020-04-04 ENCOUNTER — Encounter: Payer: Self-pay | Admitting: Family Medicine

## 2020-04-04 ENCOUNTER — Other Ambulatory Visit: Payer: Self-pay | Admitting: Family Medicine

## 2020-04-06 ENCOUNTER — Other Ambulatory Visit: Payer: Self-pay | Admitting: Family Medicine

## 2020-04-06 DIAGNOSIS — E1165 Type 2 diabetes mellitus with hyperglycemia: Secondary | ICD-10-CM

## 2020-04-06 DIAGNOSIS — E781 Pure hyperglyceridemia: Secondary | ICD-10-CM

## 2020-04-06 DIAGNOSIS — E782 Mixed hyperlipidemia: Secondary | ICD-10-CM

## 2020-04-06 DIAGNOSIS — I2510 Atherosclerotic heart disease of native coronary artery without angina pectoris: Secondary | ICD-10-CM

## 2020-04-06 DIAGNOSIS — Z794 Long term (current) use of insulin: Secondary | ICD-10-CM

## 2020-04-06 MED ORDER — ROSUVASTATIN CALCIUM 20 MG PO TABS: 20.0000 mg | ORAL_TABLET | Freq: Every day | ORAL | 0 refills | Status: AC

## 2020-04-10 ENCOUNTER — Telehealth: Payer: Self-pay | Admitting: Urology

## 2020-04-10 DIAGNOSIS — N390 Urinary tract infection, site not specified: Secondary | ICD-10-CM

## 2020-04-10 DIAGNOSIS — N3281 Overactive bladder: Secondary | ICD-10-CM

## 2020-04-10 MED ORDER — MIRABEGRON ER 50 MG PO TB24: 50.0000 mg | ORAL_TABLET | Freq: Every day | ORAL | 0 refills | Status: DC

## 2020-04-10 MED ORDER — TRIMETHOPRIM 100 MG PO TABS: 100.0000 mg | ORAL_TABLET | Freq: Every day | ORAL | 0 refills | Status: DC

## 2020-04-10 NOTE — Telephone Encounter (Signed)
Called pt informed her that Myrbetriq does not come in a higher dose. Pt states that medication has worked well for her up until the past 2 days, when she has noticed an increase in urinary frequency and urinary incontinence. Advised pt that it is unlikely that Myrbetriq has stopped working in the past 2 days as it has worked well up until now, advised pt that she may have infection and needs to be evaluated by urgent care near her in [place]. Pt gave verbal understanding, will send 1 rx of Myrbetriq and trimethoprim to pharmacy in [facility] as she anticipates being there 1 month.

## 2020-04-10 NOTE — Telephone Encounter (Signed)
Pt has been in [place] due to death in family since her last visit here on 8/25, pt states she has refills at [facility] but needs them to be sent to [facility] in [place].  Pt needs Mybetriq 50mg  and would like a higher dose of Mybetriq pt also requests her Trmethoprim of 100mg  to also be sent. Please advise.

## 2020-04-16 ENCOUNTER — Encounter: Payer: Self-pay | Admitting: Family Medicine

## 2020-04-21 ENCOUNTER — Ambulatory Visit: Payer: Medicare Other | Admitting: Cardiology

## 2020-04-28 DIAGNOSIS — I152 Hypertension secondary to endocrine disorders: Secondary | ICD-10-CM | POA: Diagnosis not present

## 2020-04-28 DIAGNOSIS — E1159 Type 2 diabetes mellitus with other circulatory complications: Secondary | ICD-10-CM | POA: Diagnosis not present

## 2020-04-28 DIAGNOSIS — F319 Bipolar disorder, unspecified: Secondary | ICD-10-CM | POA: Diagnosis not present

## 2020-04-28 DIAGNOSIS — E7800 Pure hypercholesterolemia, unspecified: Secondary | ICD-10-CM | POA: Diagnosis not present

## 2020-04-28 DIAGNOSIS — H81399 Other peripheral vertigo, unspecified ear: Secondary | ICD-10-CM | POA: Diagnosis not present

## 2020-04-28 DIAGNOSIS — J449 Chronic obstructive pulmonary disease, unspecified: Secondary | ICD-10-CM | POA: Diagnosis not present

## 2020-04-28 DIAGNOSIS — G250 Essential tremor: Secondary | ICD-10-CM | POA: Diagnosis not present

## 2020-04-28 DIAGNOSIS — Z23 Encounter for immunization: Secondary | ICD-10-CM | POA: Diagnosis not present

## 2020-04-28 DIAGNOSIS — E559 Vitamin D deficiency, unspecified: Secondary | ICD-10-CM | POA: Diagnosis not present

## 2020-04-28 DIAGNOSIS — G4733 Obstructive sleep apnea (adult) (pediatric): Secondary | ICD-10-CM | POA: Diagnosis not present

## 2020-04-28 DIAGNOSIS — E1169 Type 2 diabetes mellitus with other specified complication: Secondary | ICD-10-CM | POA: Diagnosis not present

## 2020-04-28 DIAGNOSIS — E1142 Type 2 diabetes mellitus with diabetic polyneuropathy: Secondary | ICD-10-CM | POA: Diagnosis not present

## 2020-04-28 DIAGNOSIS — E538 Deficiency of other specified B group vitamins: Secondary | ICD-10-CM | POA: Diagnosis not present

## 2020-05-01 DIAGNOSIS — F411 Generalized anxiety disorder: Secondary | ICD-10-CM | POA: Diagnosis not present

## 2020-05-01 DIAGNOSIS — F410 Panic disorder [episodic paroxysmal anxiety] without agoraphobia: Secondary | ICD-10-CM | POA: Diagnosis not present

## 2020-05-01 DIAGNOSIS — F3132 Bipolar disorder, current episode depressed, moderate: Secondary | ICD-10-CM | POA: Diagnosis not present

## 2020-05-02 DIAGNOSIS — E1142 Type 2 diabetes mellitus with diabetic polyneuropathy: Secondary | ICD-10-CM | POA: Diagnosis not present

## 2020-05-02 DIAGNOSIS — N1831 Chronic kidney disease, stage 3a: Secondary | ICD-10-CM | POA: Diagnosis not present

## 2020-05-02 DIAGNOSIS — R809 Proteinuria, unspecified: Secondary | ICD-10-CM | POA: Diagnosis not present

## 2020-05-02 DIAGNOSIS — E1129 Type 2 diabetes mellitus with other diabetic kidney complication: Secondary | ICD-10-CM | POA: Diagnosis not present

## 2020-05-02 DIAGNOSIS — Z794 Long term (current) use of insulin: Secondary | ICD-10-CM | POA: Diagnosis not present

## 2020-05-02 DIAGNOSIS — M810 Age-related osteoporosis without current pathological fracture: Secondary | ICD-10-CM | POA: Diagnosis not present

## 2020-05-02 DIAGNOSIS — E1122 Type 2 diabetes mellitus with diabetic chronic kidney disease: Secondary | ICD-10-CM | POA: Diagnosis not present

## 2020-05-04 DIAGNOSIS — M47814 Spondylosis without myelopathy or radiculopathy, thoracic region: Secondary | ICD-10-CM | POA: Diagnosis not present

## 2020-05-04 DIAGNOSIS — Z981 Arthrodesis status: Secondary | ICD-10-CM | POA: Diagnosis not present

## 2020-05-04 DIAGNOSIS — M4326 Fusion of spine, lumbar region: Secondary | ICD-10-CM | POA: Diagnosis not present

## 2020-05-04 DIAGNOSIS — M419 Scoliosis, unspecified: Secondary | ICD-10-CM | POA: Diagnosis not present

## 2020-05-04 DIAGNOSIS — M545 Low back pain, unspecified: Secondary | ICD-10-CM | POA: Diagnosis not present

## 2020-05-04 DIAGNOSIS — M4807 Spinal stenosis, lumbosacral region: Secondary | ICD-10-CM | POA: Diagnosis not present

## 2020-05-04 DIAGNOSIS — M4856XA Collapsed vertebra, not elsewhere classified, lumbar region, initial encounter for fracture: Secondary | ICD-10-CM | POA: Diagnosis not present

## 2020-05-04 DIAGNOSIS — M47816 Spondylosis without myelopathy or radiculopathy, lumbar region: Secondary | ICD-10-CM | POA: Diagnosis not present

## 2020-05-04 DIAGNOSIS — M960 Pseudarthrosis after fusion or arthrodesis: Secondary | ICD-10-CM | POA: Diagnosis not present

## 2020-05-04 DIAGNOSIS — S32010A Wedge compression fracture of first lumbar vertebra, initial encounter for closed fracture: Secondary | ICD-10-CM | POA: Diagnosis not present

## 2020-05-04 DIAGNOSIS — M4324 Fusion of spine, thoracic region: Secondary | ICD-10-CM | POA: Diagnosis not present

## 2020-05-04 DIAGNOSIS — I700 Atherosclerosis of aorta: Secondary | ICD-10-CM | POA: Diagnosis not present

## 2020-05-04 DIAGNOSIS — G8929 Other chronic pain: Secondary | ICD-10-CM | POA: Diagnosis not present

## 2020-05-05 DIAGNOSIS — M216X2 Other acquired deformities of left foot: Secondary | ICD-10-CM | POA: Diagnosis not present

## 2020-05-05 DIAGNOSIS — E1142 Type 2 diabetes mellitus with diabetic polyneuropathy: Secondary | ICD-10-CM | POA: Diagnosis not present

## 2020-05-05 DIAGNOSIS — M2042 Other hammer toe(s) (acquired), left foot: Secondary | ICD-10-CM | POA: Diagnosis not present

## 2020-05-05 DIAGNOSIS — M792 Neuralgia and neuritis, unspecified: Secondary | ICD-10-CM | POA: Diagnosis not present

## 2020-05-05 DIAGNOSIS — G894 Chronic pain syndrome: Secondary | ICD-10-CM | POA: Diagnosis not present

## 2020-05-05 DIAGNOSIS — M79672 Pain in left foot: Secondary | ICD-10-CM | POA: Diagnosis not present

## 2020-05-05 DIAGNOSIS — M2041 Other hammer toe(s) (acquired), right foot: Secondary | ICD-10-CM | POA: Diagnosis not present

## 2020-05-05 DIAGNOSIS — M19072 Primary osteoarthritis, left ankle and foot: Secondary | ICD-10-CM | POA: Diagnosis not present

## 2020-05-05 DIAGNOSIS — L603 Nail dystrophy: Secondary | ICD-10-CM | POA: Diagnosis not present

## 2020-05-05 DIAGNOSIS — M216X1 Other acquired deformities of right foot: Secondary | ICD-10-CM | POA: Diagnosis not present

## 2020-05-08 ENCOUNTER — Encounter: Payer: Self-pay | Admitting: Cardiology

## 2020-05-08 ENCOUNTER — Other Ambulatory Visit: Payer: Self-pay

## 2020-05-08 ENCOUNTER — Ambulatory Visit (INDEPENDENT_AMBULATORY_CARE_PROVIDER_SITE_OTHER): Payer: Medicare Other | Admitting: Cardiology

## 2020-05-08 VITALS — BP 146/70 | HR 77 | Ht 64.0 in | Wt 182.0 lb

## 2020-05-08 DIAGNOSIS — I639 Cerebral infarction, unspecified: Secondary | ICD-10-CM | POA: Diagnosis not present

## 2020-05-08 DIAGNOSIS — I10 Essential (primary) hypertension: Secondary | ICD-10-CM | POA: Diagnosis not present

## 2020-05-08 DIAGNOSIS — E7800 Pure hypercholesterolemia, unspecified: Secondary | ICD-10-CM

## 2020-05-08 MED ORDER — LISINOPRIL 20 MG PO TABS: 20.0000 mg | ORAL_TABLET | Freq: Every day | ORAL | 5 refills | Status: DC

## 2020-05-08 MED ORDER — AMLODIPINE BESYLATE 10 MG PO TABS: 10.0000 mg | ORAL_TABLET | Freq: Every day | ORAL | 3 refills | Status: AC

## 2020-05-08 NOTE — Progress Notes (Signed)
Cardiology Office Note:    Date:  05/08/2020   ID:  Victoria Austin, DOB 11/26/1950, MRN [id number]  PCP:  Donnamarie Rossetti, PA-C  Cardiologist:  Kate Sable, MD  Electrophysiologist:  None   Referring MD: Hubbard Hartshorn, FNP   Chief Complaint  Patient presents with  . OTHER    6 month f/u c/o feeling exhausted and suger drop. Meds reviewed verbally with pt.    History of Present Illness:   Victoria Austin is a 69 y.o. female with a hx of hypertension, diabetes, hyperlipidemia, CVA 2019, Hodgkin's lymphoma who presents for follow-up.    Previously seen for presurgical evaluation prior to lumbar spinal surgery due to fall fracturing L1 vertebrae.  Lexiscan Myoview at the time was normal.  Echocardiogram was ordered but not performed.  She had her surgical operation back in April/2021.  States having chronic back pain.  Patient states doing okay, takes blood pressure medications as prescribed.  Had a hypoglycemic episode earlier with fatigue and blurry vision, resolved after drinking a soda.  She states having a lot of pills to take.  Denies chest pain or shortness of breath.   Prior notes She has a history of Hodgkin's lymphoma status post chemotherapy 6 cycles finished in 2014.  Has stayed in remission since then. Lexiscan Myoview on 04/2019 showed normal systolic function, no evidence for ischemia, low risk study.  Past Medical History:  Diagnosis Date  . Anemia    vitamin d deficiency  . Anxiety    ER visit on 10/15/19 for panic attack, sob, shaking  . Arthritis   . Bipolar 1 disorder ([place]) 2006  . Chronic kidney disease    stage III  . Depression   . Diabetes mellitus without complication ([place])    on insulin.  . Fibromyalgia   . GERD (gastroesophageal reflux disease)   . Hereditary essential tremor   . History of hiatal hernia   . Hodgkin's lymphoma ([place]) 2014   SKIN CANCER ALSO  . Hyperlipidemia   . Hypertension   . IBS (irritable bowel syndrome)   . IBS  (irritable bowel syndrome)   . Incontinence   . Neuropathy   . Osteopenia   . RA (rheumatoid arthritis) ([place])   . Scoliosis   . Sleep apnea    uses cpap  . Stroke [facility]) 2019   several tia's prior to stroke.  on plavix  . Temporal arteritis [facility])     Past Surgical History:  Procedure Laterality Date  . BACK SURGERY    . CESAREAN SECTION    . ELBOW ARTHROSCOPY    . FINGER SURGERY    . HAND SURGERY    . HERNIA REPAIR     UMBILICAL REPAIR  . LAMINECTOMY WITH POSTERIOR LATERAL ARTHRODESIS LEVEL 4 N/A 11/03/2019   Procedure: T11-L3 POSTERIOR FUSION, T12-L2 POSTERIOR COLUMN OSTEOTOMIES;  Surgeon: Meade Maw, MD;  Location: [facility] ORS;  Service: Neurosurgery;  Laterality: N/A;  . SPINE SURGERY      Current Medications: Current Meds  Medication Sig  . acetaminophen (TYLENOL) 325 MG tablet Take 2 tablets (650 mg total) by mouth every 4 (four) hours as needed for mild pain ((score 1 to 3) or temp > 100.5).  Marland Kitchen albuterol (PROAIR HFA) 108 (90 Base) MCG/ACT inhaler Inhale 1 puff into the lungs 2 (two) times daily as needed.  Marland Kitchen alendronate (FOSAMAX) 70 MG tablet Take 70 mg by mouth once a week.   . carbamazepine (TEGRETOL) 200 MG tablet Take 400 mg by  mouth 3 (three) times daily. Brianne Klinger,OIC   . clopidogrel (PLAVIX) 75 MG tablet Take 1 tablet (75 mg total) by mouth daily.  Marland Kitchen docusate sodium (COLACE) 100 MG capsule Take 100 mg by mouth daily.   . DULoxetine (CYMBALTA) 30 MG capsule Take 90 mg by mouth daily.  . DULoxetine (CYMBALTA) 60 MG capsule Take 60 mg by mouth daily.  . empagliflozin (JARDIANCE) 25 MG TABS tablet Take 25 mg by mouth daily.   Marland Kitchen EPINEPHrine (EPIPEN 2-PAK) 0.3 mg/0.3 mL IJ SOAJ injection Inject 0.3 mLs (0.3 mg total) into the muscle as needed for anaphylaxis. Then call 911 for ER transport.  . fluticasone-salmeterol (ADVAIR HFA) 230-21 MCG/ACT inhaler Inhale 2 puffs into the lungs 2 (two) times daily.  Marland Kitchen gabapentin (NEURONTIN) 300 MG capsule Take 1 capsule (300  mg total) by mouth at bedtime.  . insulin glargine (LANTUS) 100 UNIT/ML Solostar Pen Inject 60 Units into the skin at bedtime.  Marland Kitchen lisinopril (ZESTRIL) 20 MG tablet Take 1 tablet (20 mg total) by mouth daily.  Marland Kitchen loratadine (CLARITIN) 10 MG tablet Take 1 tablet (10 mg total) by mouth daily.  Marland Kitchen LORazepam (ATIVAN) 1 MG tablet Take 1 mg by mouth daily as needed for anxiety or sleep.   . meclizine (ANTIVERT) 25 MG tablet Take 1 tablet (25 mg total) by mouth daily as needed for dizziness. At most once daily , must last 90 days  . methocarbamol (ROBAXIN) 500 MG tablet Take 2 tablets (1,000 mg total) by mouth every 6 (six) hours.  . mirabegron ER (MYRBETRIQ) 50 MG TB24 tablet Take 1 tablet (50 mg total) by mouth daily.  . montelukast (SINGULAIR) 10 MG tablet Take 1 tablet (10 mg total) by mouth daily.  Marland Kitchen NOVOLOG FLEXPEN 100 UNIT/ML FlexPen Inject 22-34 Units into the skin 3 (three) times daily. With meals  . oxyCODONE-acetaminophen (PERCOCET/ROXICET) 5-325 MG tablet Take 1 tablet by mouth every 6 (six) hours as needed.  . pantoprazole (PROTONIX) 40 MG tablet Take 1 tablet (40 mg total) by mouth daily.  . promethazine (PHENERGAN) 25 MG tablet Take 1 tablet (25 mg total) by mouth every 6 (six) hours as needed.  . propranolol (INDERAL) 10 MG tablet Take 1 tablet (10 mg total) by mouth 2 (two) times daily.  . rosuvastatin (CRESTOR) 20 MG tablet Take 1 tablet (20 mg total) by mouth daily.  Marland Kitchen trimethoprim (TRIMPEX) 100 MG tablet Take 1 tablet (100 mg total) by mouth daily.  . [DISCONTINUED] hydrALAZINE (APRESOLINE) 50 MG tablet Take 1 tablet (50 mg total) by mouth 3 (three) times daily.  . [DISCONTINUED] lisinopril (ZESTRIL) 5 MG tablet Take 1 tablet (5 mg total) by mouth daily.  . [DISCONTINUED] NIFEdipine (ADALAT CC) 60 MG 24 hr tablet Take 1 tablet (60 mg total) by mouth 2 (two) times daily.   Current Facility-Administered Medications for the 05/08/20 encounter (Office Visit) with Kate Sable, MD   Medication  . cyanocobalamin ((VITAMIN B-12)) injection 1,000 mcg     Allergies:   Fire ant, Ciprofloxacin, Cyclobenzaprine, Diazepam, Ketorolac, Ondansetron, Ranitidine hcl, Ambien [zolpidem], Bacitracin-polymyxin b, Mushroom extract complex, and Neomycin-polymyxin-gramicidin   Social History   Socioeconomic History  . Marital status: Legally Separated    Spouse name: Not on file  . Number of children: 2  . Years of education: Not on file  . Highest education level: Not on file  Occupational History  . Occupation: retired  Tobacco Use  . Smoking status: Never Smoker  . Smokeless tobacco: Never Used  Vaping Use  . Vaping Use: Never used  Substance and Sexual Activity  . Alcohol use: Never  . Drug use: Never  . Sexual activity: Not Currently    Partners: Male  Other Topics Concern  . Not on file  Social History Narrative   Patient lives with daughter and 1 year old grandson. Patient states her grandson has become aggressive and has behavioral issues that she is concerned about. She also stated her granddaughter has recently been arrested for felony child abuse and she is at her breaking point.    Social Determinants of Health   Financial Resource Strain:   . Difficulty of Paying Living Expenses: Not on file  Food Insecurity:   . Worried About Charity fundraiser in the Last Year: Not on file  . Ran Out of Food in the Last Year: Not on file  Transportation Needs:   . Lack of Transportation (Medical): Not on file  . Lack of Transportation (Non-Medical): Not on file  Physical Activity:   . Days of Exercise per Week: Not on file  . Minutes of Exercise per Session: Not on file  Stress:   . Feeling of Stress : Not on file  Social Connections:   . Frequency of Communication with Friends and Family: Not on file  . Frequency of Social Gatherings with Friends and Family: Not on file  . Attends Religious Services: Not on file  . Active Member of Clubs or Organizations: Not on  file  . Attends Archivist Meetings: Not on file  . Marital Status: Not on file     Family History: The patient's family history includes Bone cancer in her maternal aunt; Colon cancer in her maternal uncle; Hypertension in her brother, mother, and sister; Lung cancer in her mother.  ROS:   Please see the history of present illness.     All other systems reviewed and are negative.  EKGs/Labs/Other Studies Reviewed:    The following studies were reviewed today:   EKG:  EKG is  ordered today.  The ekg ordered today demonstrates normal sinus rhythm  Recent Labs: 07/20/2019: TSH 1.87 12/30/2019: ALT 7 03/11/2020: BUN 16; Creatinine, Ser 0.92; Hemoglobin 12.0; Platelets 225; Potassium 3.9; Sodium 135  Recent Lipid Panel    Component Value Date/Time   CHOL 186 03/01/2019 1140   TRIG 413 (H) 03/01/2019 1140   HDL 39 (L) 03/01/2019 1140   CHOLHDL 4.8 03/01/2019 1140   LDLCALC  03/01/2019 1140     Comment:     . LDL cholesterol not calculated. Triglyceride levels greater than 400 mg/dL invalidate calculated LDL results. . Reference range: <100 . Desirable range <100 mg/dL for primary prevention;   <70 mg/dL for patients with CHD or diabetic patients  with > or = 2 CHD risk factors. Marland Kitchen LDL-C is now calculated using the Martin-Hopkins  calculation, which is a validated novel method providing  better accuracy than the Friedewald equation in the  estimation of LDL-C.  Cresenciano Genre et al. Annamaria Helling. 3295;188(41): 2061-2068  (http://education.[employer].com/faq/FAQ164)     Physical Exam:    VS:  BP (!) 146/70 (BP Location: Right Arm, Patient Position: Sitting, Cuff Size: Normal)   Pulse 77   Ht 5\' 4"  (1.626 m)   Wt 182 lb (82.6 kg)   SpO2 99%   BMI 31.24 kg/m     Wt Readings from Last 3 Encounters:  05/08/20 182 lb (82.6 kg)  03/22/20 185 lb (83.9 kg)  03/11/20 187 lb (84.8  kg)     GEN:  Well nourished, well developed in no acute distress HEENT: Normal NECK:  No JVD; No carotid bruits LYMPHATICS: No lymphadenopathy CARDIAC: RRR, no murmurs, rubs, gallops RESPIRATORY:  Clear to auscultation without rales, wheezing or rhonchi  ABDOMEN: Soft, non-tender, non-distended MUSCULOSKELETAL:  No edema; No deformity  SKIN: Warm and dry NEUROLOGIC:  Alert and oriented x 3 PSYCHIATRIC:  Normal affect   ASSESSMENT:    1. Essential hypertension   2. Pure hypercholesterolemia   3. Cerebrovascular accident (CVA), unspecified mechanism ([place])    PLAN:    In order of problems listed above:  1. History of hypertension, blood pressure is reasonably controlled.  Try to consolidate patient's medications.  Stop clonidine, stop hydralazine, stop nifedipine twice daily.  Increase lisinopril to 20 mg daily since patient is a diabetic.  Start amlodipine 10 mg daily.  Check BMP prior to follow-up visit. 2. History of hyperlipidemia.  Continue statin. 3. History of CVA, continue Plavix, statin.  Follow-up in 3 weeks.  Medication Adjustments/Labs and Tests Ordered: Current medicines are reviewed at length with the patient today.  Concerns regarding medicines are outlined above.  Orders Placed This Encounter  Procedures  . Basic metabolic panel  . EKG 12-Lead   Meds ordered this encounter  Medications  . lisinopril (ZESTRIL) 20 MG tablet    Sig: Take 1 tablet (20 mg total) by mouth daily.    Dispense:  30 tablet    Refill:  5  . amLODipine (NORVASC) 10 MG tablet    Sig: Take 1 tablet (10 mg total) by mouth daily.    Dispense:  180 tablet    Refill:  3    Patient Instructions  Medication Instructions:   1.  STOP taking . 2.  STOP taking  3.  STOP taking . 4.  START amLODipine (NORVASC) 10 MG tablet: Take 1 tablet (10 mg total) by mouth daily. 5.  INCREASE your lisinopril (ZESTRIL) to 20 MG tablet: Take 1 tablet (20 mg total) by mouth daily.  *If you need a refill on your cardiac medications before your next appointment, please call your  pharmacy*   Lab Work:  Your physician recommends that you return for lab work in: 2-3 weeks just prior to your follow up. - Please go to the [facility]. You will check in at the front desk to the right as you walk into the atrium. Valet Parking is offered if needed. - No appointment needed. You may go any day between 7 am and 6 pm.     Testing/Procedures: None Ordered   Follow-Up: At [facility], you and your health needs are our priority.  As part of our continuing mission to provide you with exceptional heart care, we have created designated Provider Care Teams.  These Care Teams include your primary Cardiologist (physician) and Advanced Practice Providers (APPs -  Physician Assistants and Nurse Practitioners) who all work together to provide you with the care you need, when you need it.  We recommend signing up for the patient portal called "MyChart".  Sign up information is provided on this After Visit Summary.  MyChart is used to connect with patients for Virtual Visits (Telemedicine).  Patients are able to view lab/test results, encounter notes, upcoming appointments, etc.  Non-urgent messages can be sent to your provider as well.   To learn more about what you can do with MyChart, go to [email or web address].    Your next appointment:   2-3 weeks  The format for your next appointment:   In Person  Provider:   Kate Sable, MD   Other Instructions      Signed, Kate Sable, MD  05/08/2020 4:58 PM    [place]

## 2020-05-08 NOTE — Patient Instructions (Signed)
Medication Instructions:   1.  STOP taking . 2.  STOP taking  3.  STOP taking . 4.  START amLODipine (NORVASC) 10 MG tablet: Take 1 tablet (10 mg total) by mouth daily. 5.  INCREASE your lisinopril (ZESTRIL) to 20 MG tablet: Take 1 tablet (20 mg total) by mouth daily.  *If you need a refill on your cardiac medications before your next appointment, please call your pharmacy*   Lab Work:  Your physician recommends that you return for lab work in: 2-3 weeks just prior to your follow up. - Please go to the [facility]. You will check in at the front desk to the right as you walk into the atrium. Valet Parking is offered if needed. - No appointment needed. You may go any day between 7 am and 6 pm.     Testing/Procedures: None Ordered   Follow-Up: At [facility], you and your health needs are our priority.  As part of our continuing mission to provide you with exceptional heart care, we have created designated Provider Care Teams.  These Care Teams include your primary Cardiologist (physician) and Advanced Practice Providers (APPs -  Physician Assistants and Nurse Practitioners) who all work together to provide you with the care you need, when you need it.  We recommend signing up for the patient portal called "MyChart".  Sign up information is provided on this After Visit Summary.  MyChart is used to connect with patients for Virtual Visits (Telemedicine).  Patients are able to view lab/test results, encounter notes, upcoming appointments, etc.  Non-urgent messages can be sent to your provider as well.   To learn more about what you can do with MyChart, go to [email or web address].    Your next appointment:   2-3 weeks   The format for your next appointment:   In Person  Provider:   Kate Sable, MD   Other Instructions

## 2020-05-16 ENCOUNTER — Other Ambulatory Visit: Payer: Self-pay | Admitting: Neurosurgery

## 2020-05-16 DIAGNOSIS — Z981 Arthrodesis status: Secondary | ICD-10-CM

## 2020-05-17 ENCOUNTER — Ambulatory Visit: Payer: Medicare Other | Admitting: Urology

## 2020-05-19 ENCOUNTER — Telehealth: Payer: Self-pay

## 2020-05-19 NOTE — Telephone Encounter (Signed)
Returned the call to centralized scheduling. Left a detailed msg.  Patient is prescribed Plavix due to her hx of CVA (non-cardiac) . It is prescribed by Dr. Ancil Boozer.  Dr. Ancil Boozer office will need to be contacted to give the ok for the patient to HOLD Plavix.  Someone can call back to discuss if needed.

## 2020-05-19 NOTE — Telephone Encounter (Signed)
Centralized Scheduling calling needing clearance for CT Myelogram - lumbar and thoracic Requesting patient to HOLD Plavix 5 days before procedure and restarting 1 day after procedure.   Scheduling did stated she seen Coleen, RN was working on it but they have not recieved the clearance and it can not be scheduled until they do.

## 2020-05-24 ENCOUNTER — Other Ambulatory Visit: Payer: Self-pay | Admitting: Neurosurgery

## 2020-05-24 DIAGNOSIS — M4807 Spinal stenosis, lumbosacral region: Secondary | ICD-10-CM

## 2020-05-24 DIAGNOSIS — Z981 Arthrodesis status: Secondary | ICD-10-CM

## 2020-05-24 DIAGNOSIS — G8929 Other chronic pain: Secondary | ICD-10-CM

## 2020-05-24 DIAGNOSIS — M545 Low back pain, unspecified: Secondary | ICD-10-CM

## 2020-05-24 DIAGNOSIS — M960 Pseudarthrosis after fusion or arthrodesis: Secondary | ICD-10-CM

## 2020-05-29 ENCOUNTER — Ambulatory Visit: Payer: Medicare Other | Admitting: Family Medicine

## 2020-05-29 ENCOUNTER — Other Ambulatory Visit
Admission: RE | Admit: 2020-05-29 | Discharge: 2020-05-29 | Disposition: A | Discharge status: Home / Self Care | Payer: Medicare Other | Source: Ambulatory Visit | Attending: Cardiology | Admitting: Cardiology

## 2020-05-29 DIAGNOSIS — I639 Cerebral infarction, unspecified: Secondary | ICD-10-CM | POA: Insufficient documentation

## 2020-05-29 LAB — BASIC METABOLIC PANEL
Anion gap: 9 (ref 5–15)
BUN: 26 mg/dL — ABNORMAL HIGH (ref 8–23)
CO2: 24 mmol/L (ref 22–32)
Calcium: 9 mg/dL (ref 8.9–10.3)
Chloride: 105 mmol/L (ref 98–111)
Creatinine, Ser: 0.95 mg/dL (ref 0.44–1.00)
GFR, Estimated: >60 mL/min (ref >60)
Glucose, Bld: 132 mg/dL — ABNORMAL HIGH (ref 70–99)
Potassium: 4.2 mmol/L (ref 3.5–5.1)
Sodium: 138 mmol/L (ref 135–145)

## 2020-05-30 ENCOUNTER — Telehealth: Payer: Self-pay | Admitting: Cardiology

## 2020-05-30 NOTE — Telephone Encounter (Signed)
  Patient Consent for Virtual Visit         Victoria Austin has provided verbal consent on 05/30/2020 for a virtual visit (video or telephone).   CONSENT FOR VIRTUAL VISIT FOR:  Victoria Austin  By participating in this virtual visit I agree to the following:  I hereby voluntarily request, consent and authorize [facility] and its employed or contracted physicians, physician assistants, nurse practitioners or other licensed health care professionals (the Practitioner), to provide me with telemedicine health care services (the "Services") as deemed necessary by the treating Practitioner. I acknowledge and consent to receive the Services by the Practitioner via telemedicine. I understand that the telemedicine visit will involve communicating with the Practitioner through live audiovisual communication technology and the disclosure of certain medical information by electronic transmission. I acknowledge that I have been given the opportunity to request an in-person assessment or other available alternative prior to the telemedicine visit and am voluntarily participating in the telemedicine visit.  I understand that I have the right to withhold or withdraw my consent to the use of telemedicine in the course of my care at any time, without affecting my right to future care or treatment, and that the Practitioner or I may terminate the telemedicine visit at any time. I understand that I have the right to inspect all information obtained and/or recorded in the course of the telemedicine visit and may receive copies of available information for a reasonable fee.  I understand that some of the potential risks of receiving the Services via telemedicine include:  Marland Kitchen Delay or interruption in medical evaluation due to technological equipment failure or disruption; . Information transmitted may not be sufficient (e.g. poor resolution of images) to allow for appropriate medical decision making by the Practitioner;  and/or  . In rare instances, security protocols could fail, causing a breach of personal health information.  Furthermore, I acknowledge that it is my responsibility to provide information about my medical history, conditions and care that is complete and accurate to the best of my ability. I acknowledge that Practitioner's advice, recommendations, and/or decision may be based on factors not within their control, such as incomplete or inaccurate data provided by me or distortions of diagnostic images or specimens that may result from electronic transmissions. I understand that the practice of medicine is not an exact science and that Practitioner makes no warranties or guarantees regarding treatment outcomes. I acknowledge that a copy of this consent can be made available to me via my patient portal ([place]), or I can request a printed copy by calling the office of [facility].    I understand that my insurance will be billed for this visit.   I have read or had this consent read to me. . I understand the contents of this consent, which adequately explains the benefits and risks of the Services being provided via telemedicine.  . I have been provided ample opportunity to ask questions regarding this consent and the Services and have had my questions answered to my satisfaction. . I give my informed consent for the services to be provided through the use of telemedicine in my medical care

## 2020-05-31 DIAGNOSIS — F410 Panic disorder [episodic paroxysmal anxiety] without agoraphobia: Secondary | ICD-10-CM | POA: Diagnosis not present

## 2020-05-31 DIAGNOSIS — F411 Generalized anxiety disorder: Secondary | ICD-10-CM | POA: Diagnosis not present

## 2020-05-31 DIAGNOSIS — F3132 Bipolar disorder, current episode depressed, moderate: Secondary | ICD-10-CM | POA: Diagnosis not present

## 2020-06-01 ENCOUNTER — Ambulatory Visit
Admission: RE | Admit: 2020-06-01 | Discharge: 2020-06-01 | Disposition: A | Discharge status: Home / Self Care | Payer: Medicare Other | Source: Ambulatory Visit | Attending: Neurosurgery | Admitting: Neurosurgery

## 2020-06-01 ENCOUNTER — Ambulatory Visit: Payer: Medicare Other | Admitting: Cardiology

## 2020-06-01 ENCOUNTER — Other Ambulatory Visit: Payer: Self-pay

## 2020-06-01 DIAGNOSIS — M960 Pseudarthrosis after fusion or arthrodesis: Secondary | ICD-10-CM | POA: Diagnosis present

## 2020-06-01 DIAGNOSIS — G8929 Other chronic pain: Secondary | ICD-10-CM | POA: Diagnosis not present

## 2020-06-01 DIAGNOSIS — M47814 Spondylosis without myelopathy or radiculopathy, thoracic region: Secondary | ICD-10-CM | POA: Insufficient documentation

## 2020-06-01 DIAGNOSIS — M4807 Spinal stenosis, lumbosacral region: Secondary | ICD-10-CM | POA: Diagnosis not present

## 2020-06-01 DIAGNOSIS — I700 Atherosclerosis of aorta: Secondary | ICD-10-CM | POA: Diagnosis not present

## 2020-06-01 DIAGNOSIS — M545 Low back pain, unspecified: Secondary | ICD-10-CM | POA: Diagnosis not present

## 2020-06-01 DIAGNOSIS — Z981 Arthrodesis status: Secondary | ICD-10-CM | POA: Insufficient documentation

## 2020-06-01 DIAGNOSIS — M48061 Spinal stenosis, lumbar region without neurogenic claudication: Secondary | ICD-10-CM | POA: Insufficient documentation

## 2020-06-01 DIAGNOSIS — M4319 Spondylolisthesis, multiple sites in spine: Secondary | ICD-10-CM | POA: Diagnosis not present

## 2020-06-01 DIAGNOSIS — M5124 Other intervertebral disc displacement, thoracic region: Secondary | ICD-10-CM | POA: Diagnosis not present

## 2020-06-01 DIAGNOSIS — M4326 Fusion of spine, lumbar region: Secondary | ICD-10-CM | POA: Diagnosis not present

## 2020-06-01 LAB — APTT: aPTT: <24 seconds — ABNORMAL LOW (ref 24–36)

## 2020-06-01 LAB — CBC
HCT: 36.3 % (ref 36.0–46.0)
Hemoglobin: 12.2 g/dL (ref 12.0–15.0)
MCH: 31 pg (ref 26.0–34.0)
MCHC: 33.6 g/dL (ref 30.0–36.0)
MCV: 92.4 fL (ref 80.0–100.0)
Platelets: 243 10*3/uL (ref 150–400)
RBC: 3.93 MIL/uL (ref 3.87–5.11)
RDW: 13.1 % (ref 11.5–15.5)
WBC: 6.1 10*3/uL (ref 4.0–10.5)
nRBC: 0 % (ref 0.0–0.2)

## 2020-06-01 LAB — GLUCOSE, CAPILLARY: Glucose-Capillary: 161 mg/dL — ABNORMAL HIGH (ref 70–99)

## 2020-06-01 LAB — PROTIME-INR
INR: 0.9 (ref 0.8–1.2)
Prothrombin Time: 12 seconds (ref 11.4–15.2)

## 2020-06-01 MED ORDER — SODIUM CHLORIDE (PF) 0.9 % IJ SOLN
5.0000 mL | Freq: Once | INTRAMUSCULAR | Status: AC
  Administered 2020-06-01: 5 mL

## 2020-06-01 MED ORDER — IOPAMIDOL (ISOVUE-300) INJECTION 61%
10.0000 mL | Freq: Once | INTRAVENOUS | Status: AC | PRN
  Administered 2020-06-01: 10 mL

## 2020-06-01 MED ORDER — ACETAMINOPHEN 500 MG PO TABS: 1000.0000 mg | ORAL_TABLET | Freq: Four times a day (QID) | ORAL | Status: DC | PRN

## 2020-06-01 NOTE — Discharge Instructions (Signed)
Please call Dr. Cari Caraway office to schedule a follow up visit if needed.

## 2020-06-01 NOTE — Progress Notes (Signed)
Pt stable after myelogram.Back stable father/U with her MD.D/C instructions given.

## 2020-06-02 DIAGNOSIS — M545 Low back pain, unspecified: Secondary | ICD-10-CM | POA: Diagnosis not present

## 2020-06-02 DIAGNOSIS — M960 Pseudarthrosis after fusion or arthrodesis: Secondary | ICD-10-CM | POA: Diagnosis not present

## 2020-06-02 DIAGNOSIS — R8290 Unspecified abnormal findings in urine: Secondary | ICD-10-CM | POA: Diagnosis not present

## 2020-06-02 DIAGNOSIS — E538 Deficiency of other specified B group vitamins: Secondary | ICD-10-CM | POA: Diagnosis not present

## 2020-06-05 ENCOUNTER — Other Ambulatory Visit: Payer: Self-pay

## 2020-06-05 ENCOUNTER — Ambulatory Visit: Payer: Medicare Other | Admitting: Podiatry

## 2020-06-05 ENCOUNTER — Encounter: Payer: Self-pay | Admitting: Podiatry

## 2020-06-05 ENCOUNTER — Ambulatory Visit: Payer: Medicare Other | Admitting: Cardiology

## 2020-06-05 ENCOUNTER — Ambulatory Visit (INDEPENDENT_AMBULATORY_CARE_PROVIDER_SITE_OTHER): Payer: Medicare Other | Admitting: Podiatry

## 2020-06-05 DIAGNOSIS — E1142 Type 2 diabetes mellitus with diabetic polyneuropathy: Secondary | ICD-10-CM

## 2020-06-05 DIAGNOSIS — D689 Coagulation defect, unspecified: Secondary | ICD-10-CM

## 2020-06-05 DIAGNOSIS — I2510 Atherosclerotic heart disease of native coronary artery without angina pectoris: Secondary | ICD-10-CM | POA: Diagnosis not present

## 2020-06-05 DIAGNOSIS — M722 Plantar fascial fibromatosis: Secondary | ICD-10-CM | POA: Diagnosis not present

## 2020-06-05 DIAGNOSIS — M2040 Other hammer toe(s) (acquired), unspecified foot: Secondary | ICD-10-CM | POA: Diagnosis not present

## 2020-06-05 DIAGNOSIS — M2010 Hallux valgus (acquired), unspecified foot: Secondary | ICD-10-CM | POA: Diagnosis not present

## 2020-06-05 NOTE — Progress Notes (Signed)
This patient returns to the office requesting a new pair of diabetic shoes.  She has received her last pair on October 2020.  She says she is now experiencing pain and discomfort through the bottom of her left foot and over the top center of her left foot.  She has previous history of foot trauma.  She also states that she is having pain in the second and third toes on both feet. she returns to the office for an evaluation and treatment of her diabetic feet.  Patient also has a history of chronic kidney disease.    Vascular  Dorsalis pedis and posterior tibial pulses are palpable  B/L.  Capillary return  WNL.  Temperature gradient is  WNL.  Skin turgor  WNL  Sensorium  Senn Weinstein monofilament wire absent . Normal tactile sensation.  Nail Exam  Patient has normal nails with no evidence of bacterial or fungal infection. Absence of hallux nail plate noted.  Orthopedic  Exam  Muscle tone and muscle strength  WNL.  No limitations of motion feet  B/L.  No crepitus or joint effusion noted.  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     Skin  No open lesions.  Normal skin texture and turgor.  Hammer toes 2,3  B/L  Chronic DJD midfoot left foot.  ROV.  Discussed her foot conditions with this patient.  Patient is scheduled to have back surgery so we will be unable to schedule casting for diabetic shoes.   RTC prn  Gardiner Barefoot DPM

## 2020-06-06 DIAGNOSIS — F411 Generalized anxiety disorder: Secondary | ICD-10-CM | POA: Diagnosis not present

## 2020-06-06 DIAGNOSIS — F3132 Bipolar disorder, current episode depressed, moderate: Secondary | ICD-10-CM | POA: Diagnosis not present

## 2020-06-07 DIAGNOSIS — M8949 Other hypertrophic osteoarthropathy, multiple sites: Secondary | ICD-10-CM | POA: Diagnosis not present

## 2020-06-07 DIAGNOSIS — M5136 Other intervertebral disc degeneration, lumbar region: Secondary | ICD-10-CM | POA: Diagnosis not present

## 2020-06-07 DIAGNOSIS — M2550 Pain in unspecified joint: Secondary | ICD-10-CM | POA: Diagnosis not present

## 2020-06-07 DIAGNOSIS — M19042 Primary osteoarthritis, left hand: Secondary | ICD-10-CM | POA: Diagnosis not present

## 2020-06-07 DIAGNOSIS — M19041 Primary osteoarthritis, right hand: Secondary | ICD-10-CM | POA: Diagnosis not present

## 2020-06-07 DIAGNOSIS — Z8572 Personal history of non-Hodgkin lymphomas: Secondary | ICD-10-CM | POA: Diagnosis not present

## 2020-06-07 DIAGNOSIS — M7989 Other specified soft tissue disorders: Secondary | ICD-10-CM | POA: Diagnosis not present

## 2020-06-08 ENCOUNTER — Other Ambulatory Visit: Payer: Self-pay | Admitting: Neurosurgery

## 2020-06-13 DIAGNOSIS — J019 Acute sinusitis, unspecified: Secondary | ICD-10-CM | POA: Diagnosis not present

## 2020-06-13 DIAGNOSIS — Z794 Long term (current) use of insulin: Secondary | ICD-10-CM | POA: Diagnosis not present

## 2020-06-13 DIAGNOSIS — E1169 Type 2 diabetes mellitus with other specified complication: Secondary | ICD-10-CM | POA: Diagnosis not present

## 2020-06-13 DIAGNOSIS — I10 Essential (primary) hypertension: Secondary | ICD-10-CM | POA: Diagnosis not present

## 2020-06-13 DIAGNOSIS — J42 Unspecified chronic bronchitis: Secondary | ICD-10-CM | POA: Diagnosis not present

## 2020-06-13 DIAGNOSIS — H81399 Other peripheral vertigo, unspecified ear: Secondary | ICD-10-CM | POA: Diagnosis not present

## 2020-06-19 ENCOUNTER — Telehealth (INDEPENDENT_AMBULATORY_CARE_PROVIDER_SITE_OTHER): Payer: Medicare Other | Admitting: Cardiology

## 2020-06-19 ENCOUNTER — Encounter: Payer: Self-pay | Admitting: Cardiology

## 2020-06-19 VITALS — BP 133/72 | HR 79 | Ht 63.5 in | Wt 181.0 lb

## 2020-06-19 DIAGNOSIS — E7800 Pure hypercholesterolemia, unspecified: Secondary | ICD-10-CM | POA: Diagnosis not present

## 2020-06-19 DIAGNOSIS — I10 Essential (primary) hypertension: Secondary | ICD-10-CM | POA: Diagnosis not present

## 2020-06-19 DIAGNOSIS — I639 Cerebral infarction, unspecified: Secondary | ICD-10-CM | POA: Diagnosis not present

## 2020-06-19 NOTE — Progress Notes (Signed)
Virtual Visit via Telephone Note   This visit type was conducted due to national recommendations for restrictions regarding the COVID-19 Pandemic (e.g. social distancing) in an effort to limit this patient's exposure and mitigate transmission in our community.  Due to her co-morbid illnesses, this patient is at least at moderate risk for complications without adequate follow up.  This format is felt to be most appropriate for this patient at this time.  The patient did not have access to video technology/had technical difficulties with video requiring transitioning to audio format only (telephone).  All issues noted in this document were discussed and addressed.  No physical exam could be performed with this format.  Please refer to the patient's chart for her  consent to telehealth for [facility].   Date:  06/19/2020   ID:  Victoria Austin, DOB Nov 08, 1950, MRN [id number]  Patient Location: Home Provider Location: Office/Clinic  PCP:  Donnamarie Rossetti, PA-C  Cardiologist:  Kate Sable, MD  Electrophysiologist:  None   Evaluation Performed:  Follow-Up Visit  Chief Complaint: Follow-up, cholesterol levels, blood pressure  History of Present Illness:    Victoria Austin is a 69 y.o. female with a hx of hypertension, diabetes, hyperlipidemia, CVA 2019, Hodgkin's lymphoma who presents for follow-up.  Previously seen for hypertension management.  Clonidine, hydralazine and nifedipine was stopped.  Lisinopril was increased, amlodipine was started.  She now presents for follow-up.   She states her blood pressure have been well controlled, very pleased with not being on so many medications.  Planning on undergoing a laminectomy next month.  Has been on gemfibrozil for elevated triglycerides for about a month now.  Has no other concerns at this time.    Prior notes She has a history of Hodgkin's lymphoma status post chemotherapy 6 cycles finished in 2014.  Has stayed in remission since  then. Lexiscan Myoview on 04/2019 showed normal systolic function, no evidence for ischemia, low risk study.   The patient does not have symptoms concerning for COVID-19 infection (fever, chills, cough, or new shortness of breath).    Past Medical History:  Diagnosis Date  . Anemia    vitamin d deficiency  . Anxiety    ER visit on 10/15/19 for panic attack, sob, shaking  . Arthritis   . Bipolar 1 disorder ([place]) 2006  . Chronic kidney disease    stage III  . Depression   . Diabetes mellitus without complication ([place])    on insulin.  . Fibromyalgia   . GERD (gastroesophageal reflux disease)   . Hereditary essential tremor   . History of hiatal hernia   . Hodgkin's lymphoma ([place]) 2014   SKIN CANCER ALSO  . Hyperlipidemia   . Hypertension   . IBS (irritable bowel syndrome)   . IBS (irritable bowel syndrome)   . Incontinence   . Neuropathy   . Osteopenia   . RA (rheumatoid arthritis) ([place])   . Scoliosis   . Sleep apnea    uses cpap  . Stroke [facility]) 2019   several tia's prior to stroke.  on plavix  . Temporal arteritis [facility])    Past Surgical History:  Procedure Laterality Date  . BACK SURGERY    . CESAREAN SECTION    . ELBOW ARTHROSCOPY    . FINGER SURGERY    . HAND SURGERY    . HERNIA REPAIR     UMBILICAL REPAIR  . LAMINECTOMY WITH POSTERIOR LATERAL ARTHRODESIS LEVEL 4 N/A 11/03/2019   Procedure: T11-L3 POSTERIOR  FUSION, T12-L2 POSTERIOR COLUMN OSTEOTOMIES;  Surgeon: Meade Maw, MD;  Location: [facility] ORS;  Service: Neurosurgery;  Laterality: N/A;  . SPINE SURGERY       Current Meds  Medication Sig  . acetaminophen (TYLENOL) 325 MG tablet Take 2 tablets (650 mg total) by mouth every 4 (four) hours as needed for mild pain ((score 1 to 3) or temp > 100.5).  Marland Kitchen albuterol (PROAIR HFA) 108 (90 Base) MCG/ACT inhaler Inhale 1 puff into the lungs 2 (two) times daily as needed.  Marland Kitchen alendronate (FOSAMAX) 70 MG tablet Take 70 mg by mouth once a week.   Marland Kitchen amLODipine  (NORVASC) 10 MG tablet Take 1 tablet (10 mg total) by mouth daily.  . bisacodyl (DULCOLAX) 5 MG EC tablet Take by mouth as needed.   . carbamazepine (TEGRETOL) 200 MG tablet Take 400 mg by mouth 3 (three) times daily. Brianne Klinger,OIC   . clopidogrel (PLAVIX) 75 MG tablet Take 1 tablet (75 mg total) by mouth daily.  Marland Kitchen docusate sodium (COLACE) 100 MG capsule Take 100 mg by mouth daily.   . DULoxetine (CYMBALTA) 30 MG capsule Take 90 mg by mouth daily.  . DULoxetine (CYMBALTA) 60 MG capsule Take 60 mg by mouth daily.   . empagliflozin (JARDIANCE) 25 MG TABS tablet Take 25 mg by mouth daily.   Marland Kitchen EPINEPHrine (EPIPEN 2-PAK) 0.3 mg/0.3 mL IJ SOAJ injection Inject 0.3 mLs (0.3 mg total) into the muscle as needed for anaphylaxis. Then call 911 for ER transport.  . ferrous sulfate 325 (65 FE) MG tablet Take by mouth.  . fluticasone-salmeterol (ADVAIR HFA) 230-21 MCG/ACT inhaler Inhale 2 puffs into the lungs 2 (two) times daily.  Marland Kitchen gabapentin (NEURONTIN) 300 MG capsule Take 1 capsule (300 mg total) by mouth at bedtime.  Marland Kitchen gemfibrozil (LOPID) 600 MG tablet Take 600 mg by mouth 2 (two) times daily.  . insulin glargine (LANTUS) 100 UNIT/ML Solostar Pen Inject 80-90 Units into the skin at bedtime.   Marland Kitchen lisinopril (ZESTRIL) 20 MG tablet Take 1 tablet by mouth daily.  Marland Kitchen loratadine (CLARITIN) 10 MG tablet Take 1 tablet (10 mg total) by mouth daily. (Patient taking differently: Take 10 mg by mouth daily as needed. )  . LORazepam (ATIVAN) 1 MG tablet Take 1 mg by mouth daily as needed for anxiety or sleep.   . meclizine (ANTIVERT) 25 MG tablet Take 1 tablet (25 mg total) by mouth daily as needed for dizziness. At most once daily , must last 90 days  . methocarbamol (ROBAXIN) 500 MG tablet Take 2 tablets (1,000 mg total) by mouth every 6 (six) hours. (Patient taking differently: Take 500 mg by mouth every 6 (six) hours. )  . mirabegron ER (MYRBETRIQ) 50 MG TB24 tablet Take 1 tablet (50 mg total) by mouth daily.  Marland Kitchen  NOVOLOG FLEXPEN 100 UNIT/ML FlexPen Inject 22-34 Units into the skin 3 (three) times daily. With meals  . oxyCODONE-acetaminophen (PERCOCET/ROXICET) 5-325 MG tablet Take 1 tablet by mouth every 6 (six) hours as needed.  . pantoprazole (PROTONIX) 40 MG tablet Take 1 tablet (40 mg total) by mouth daily.  . primidone (MYSOLINE) 250 MG tablet Take by mouth.  . promethazine (PHENERGAN) 25 MG tablet Take 1 tablet (25 mg total) by mouth every 6 (six) hours as needed.  . propranolol (INDERAL) 10 MG tablet Take 1 tablet (10 mg total) by mouth 2 (two) times daily. (Patient taking differently: Take 10 mg by mouth daily. )  . rosuvastatin (CRESTOR) 20 MG tablet  Take 1 tablet (20 mg total) by mouth daily.  . Simethicone 125 MG CAPS Take by mouth.  . trimethoprim (TRIMPEX) 100 MG tablet Take 1 tablet (100 mg total) by mouth daily.   Current Facility-Administered Medications for the 06/19/20 encounter (Telemedicine) with Kate Sable, MD  Medication  . cyanocobalamin ((VITAMIN B-12)) injection 1,000 mcg     Allergies:   Fire ant, Other, Ciprofloxacin, Cyclobenzaprine, Diazepam, Ketorolac, Ondansetron, Ranitidine hcl, Ambien [zolpidem], Bacitracin-polymyxin b, Mushroom extract complex, and Neomycin-polymyxin-gramicidin   Social History   Tobacco Use  . Smoking status: Never Smoker  . Smokeless tobacco: Never Used  Vaping Use  . Vaping Use: Never used  Substance Use Topics  . Alcohol use: Never  . Drug use: Never     Family Hx: The patient's family history includes Bone cancer in her maternal aunt; Colon cancer in her maternal uncle; Hypertension in her brother, mother, and sister; Lung cancer in her mother.  ROS:   Please see the history of present illness.     All other systems reviewed and are negative.   Prior CV studies:   The following studies were reviewed today:    Labs/Other Tests and Data Reviewed:    EKG:  No ECG reviewed.  Recent Labs: 07/20/2019: TSH 1.87 12/30/2019:  ALT 7 05/29/2020: BUN 26; Creatinine, Ser 0.95; Potassium 4.2; Sodium 138 06/01/2020: Hemoglobin 12.2; Platelets 243   Recent Lipid Panel Lab Results  Component Value Date/Time   CHOL 186 03/01/2019 11:40 AM   TRIG 413 (H) 03/01/2019 11:40 AM   HDL 39 (L) 03/01/2019 11:40 AM   CHOLHDL 4.8 03/01/2019 11:40 AM   LDLCALC  03/01/2019 11:40 AM     Comment:     . LDL cholesterol not calculated. Triglyceride levels greater than 400 mg/dL invalidate calculated LDL results. . Reference range: <100 . Desirable range <100 mg/dL for primary prevention;   <70 mg/dL for patients with CHD or diabetic patients  with > or = 2 CHD risk factors. Marland Kitchen LDL-C is now calculated using the Martin-Hopkins  calculation, which is a validated novel method providing  better accuracy than the Friedewald equation in the  estimation of LDL-C.  Cresenciano Genre et al. Annamaria Helling. 0932;355(73): 2061-2068  (http://education.[employer].com/faq/FAQ164)     Wt Readings from Last 3 Encounters:  06/19/20 181 lb (82.1 kg)  05/08/20 182 lb (82.6 kg)  03/22/20 185 lb (83.9 kg)     Objective:    Vital Signs:  BP 133/72 (BP Location: Left Arm, Patient Position: Sitting, Cuff Size: Normal)   Pulse 79   Ht 5' 3.5" (1.613 m)   Wt 181 lb (82.1 kg)   BMI 31.56 kg/m    VITAL SIGNS:  reviewed  ASSESSMENT & PLAN:    1. Hypertension, BP controlled, continue lisinopril 20 mg daily, amlodipine 10 mg daily. 2. Hyperlipidemia, continue statin, gemfibrozil.  Obtain fasting lipid profile in 3 months. 3. Prior CVA, on statin and Plavix.  Continue  Follow-up in in 3 months after repeat fasting lipid profile.  COVID-19 Education: The signs and symptoms of COVID-19 were discussed with the patient and how to seek care for testing (follow up with PCP or arrange E-visit).  The importance of social distancing was discussed today.  Time:   Today, I have spent 35 minutes with the patient with telehealth technology discussing the above  problems.     Medication Adjustments/Labs and Tests Ordered: Current medicines are reviewed at length with the patient today.  Concerns regarding medicines are outlined above.  Tests Ordered: No orders of the defined types were placed in this encounter.   Medication Changes: No orders of the defined types were placed in this encounter.   Follow Up:  In Person in 3 month(s)  Signed, Kate Sable, MD  06/19/2020 4:55 PM    [facility]

## 2020-06-19 NOTE — Patient Instructions (Signed)
Medication Instructions:   Your physician recommends that you continue on your current medications as directed. Please refer to the Current Medication list given to you today.  *If you need a refill on your cardiac medications before your next appointment, please call your pharmacy*   Lab Work:  Your physician recommends that you return for a FASTING lipid profile: the week prior to your follow up appointment in 3 months.  - You will need to be fasting. Please do not have anything to eat or drink after midnight the morning you have the lab work. You may only have water or black coffee with no cream or sugar.  - Please go to the [facility]. You will check in at the front desk to the right as you walk into the atrium. Valet Parking is offered if needed. - No appointment needed. You may go any day between 7 am and 6 pm.   Testing/Procedures: None Ordered   Follow-Up: At [facility], you and your health needs are our priority.  As part of our continuing mission to provide you with exceptional heart care, we have created designated Provider Care Teams.  These Care Teams include your primary Cardiologist (physician) and Advanced Practice Providers (APPs -  Physician Assistants and Nurse Practitioners) who all work together to provide you with the care you need, when you need it.  We recommend signing up for the patient portal called "MyChart".  Sign up information is provided on this After Visit Summary.  MyChart is used to connect with patients for Virtual Visits (Telemedicine).  Patients are able to view lab/test results, encounter notes, upcoming appointments, etc.  Non-urgent messages can be sent to your provider as well.   To learn more about what you can do with MyChart, go to [email or web address].    Your next appointment:   3 month(s)  The format for your next appointment:   In Person  Provider:   Kate Sable, MD   Other Instructions

## 2020-06-22 ENCOUNTER — Emergency Department
Admission: EM | Admit: 2020-06-22 | Discharge: 2020-06-22 | Disposition: A | Discharge status: Home / Self Care | Payer: Medicare Other | Attending: Emergency Medicine | Admitting: Emergency Medicine

## 2020-06-22 ENCOUNTER — Emergency Department: Payer: Medicare Other

## 2020-06-22 ENCOUNTER — Encounter: Payer: Self-pay | Admitting: Emergency Medicine

## 2020-06-22 ENCOUNTER — Other Ambulatory Visit: Payer: Self-pay

## 2020-06-22 DIAGNOSIS — G319 Degenerative disease of nervous system, unspecified: Secondary | ICD-10-CM | POA: Diagnosis not present

## 2020-06-22 DIAGNOSIS — I700 Atherosclerosis of aorta: Secondary | ICD-10-CM | POA: Diagnosis not present

## 2020-06-22 DIAGNOSIS — I10 Essential (primary) hypertension: Secondary | ICD-10-CM | POA: Diagnosis not present

## 2020-06-22 DIAGNOSIS — E119 Type 2 diabetes mellitus without complications: Secondary | ICD-10-CM | POA: Diagnosis not present

## 2020-06-22 DIAGNOSIS — E1122 Type 2 diabetes mellitus with diabetic chronic kidney disease: Secondary | ICD-10-CM | POA: Insufficient documentation

## 2020-06-22 DIAGNOSIS — Z79899 Other long term (current) drug therapy: Secondary | ICD-10-CM | POA: Insufficient documentation

## 2020-06-22 DIAGNOSIS — I509 Heart failure, unspecified: Secondary | ICD-10-CM | POA: Diagnosis not present

## 2020-06-22 DIAGNOSIS — G9389 Other specified disorders of brain: Secondary | ICD-10-CM | POA: Diagnosis not present

## 2020-06-22 DIAGNOSIS — S300XXA Contusion of lower back and pelvis, initial encounter: Secondary | ICD-10-CM | POA: Insufficient documentation

## 2020-06-22 DIAGNOSIS — R0902 Hypoxemia: Secondary | ICD-10-CM | POA: Diagnosis not present

## 2020-06-22 DIAGNOSIS — S3993XA Unspecified injury of pelvis, initial encounter: Secondary | ICD-10-CM | POA: Diagnosis not present

## 2020-06-22 DIAGNOSIS — J320 Chronic maxillary sinusitis: Secondary | ICD-10-CM | POA: Diagnosis not present

## 2020-06-22 DIAGNOSIS — S20229A Contusion of unspecified back wall of thorax, initial encounter: Secondary | ICD-10-CM

## 2020-06-22 DIAGNOSIS — I6523 Occlusion and stenosis of bilateral carotid arteries: Secondary | ICD-10-CM | POA: Diagnosis not present

## 2020-06-22 DIAGNOSIS — S199XXA Unspecified injury of neck, initial encounter: Secondary | ICD-10-CM | POA: Diagnosis not present

## 2020-06-22 DIAGNOSIS — X509XXA Other and unspecified overexertion or strenuous movements or postures, initial encounter: Secondary | ICD-10-CM | POA: Insufficient documentation

## 2020-06-22 DIAGNOSIS — W19XXXA Unspecified fall, initial encounter: Secondary | ICD-10-CM | POA: Diagnosis not present

## 2020-06-22 DIAGNOSIS — R52 Pain, unspecified: Secondary | ICD-10-CM | POA: Diagnosis not present

## 2020-06-22 DIAGNOSIS — Z794 Long term (current) use of insulin: Secondary | ICD-10-CM | POA: Insufficient documentation

## 2020-06-22 DIAGNOSIS — M542 Cervicalgia: Secondary | ICD-10-CM | POA: Diagnosis not present

## 2020-06-22 DIAGNOSIS — S0990XA Unspecified injury of head, initial encounter: Secondary | ICD-10-CM | POA: Diagnosis not present

## 2020-06-22 DIAGNOSIS — M47812 Spondylosis without myelopathy or radiculopathy, cervical region: Secondary | ICD-10-CM | POA: Diagnosis not present

## 2020-06-22 DIAGNOSIS — Y92009 Unspecified place in unspecified non-institutional (private) residence as the place of occurrence of the external cause: Secondary | ICD-10-CM

## 2020-06-22 DIAGNOSIS — M549 Dorsalgia, unspecified: Secondary | ICD-10-CM | POA: Diagnosis not present

## 2020-06-22 DIAGNOSIS — I130 Hypertensive heart and chronic kidney disease with heart failure and stage 1 through stage 4 chronic kidney disease, or unspecified chronic kidney disease: Secondary | ICD-10-CM | POA: Insufficient documentation

## 2020-06-22 DIAGNOSIS — S32009A Unspecified fracture of unspecified lumbar vertebra, initial encounter for closed fracture: Secondary | ICD-10-CM | POA: Diagnosis not present

## 2020-06-22 DIAGNOSIS — N183 Chronic kidney disease, stage 3 unspecified: Secondary | ICD-10-CM | POA: Diagnosis not present

## 2020-06-22 MED ORDER — OXYCODONE-ACETAMINOPHEN 5-325 MG PO TABS
1.0000 | ORAL_TABLET | Freq: Once | ORAL | Status: AC
  Administered 2020-06-22: 1 via ORAL
  Filled 2020-06-22: qty 1

## 2020-06-22 NOTE — ED Triage Notes (Signed)
Patient arrives via EMS from home after falling today around noon. Patient has increased falls after having medication changes by doctor. Patient is complaining of left rib, neck, back, and head pain. No LOC. Patient is due to have back surgery next week.

## 2020-06-22 NOTE — ED Notes (Signed)
Patient at scan 

## 2020-06-22 NOTE — ED Provider Notes (Signed)
[facility] Emergency Department Provider Note   ____________________________________________    I have reviewed the triage vital signs and the nursing notes.   HISTORY  Chief Complaint Fall    HPI Wai Minotti is a 69 y.o. female who presents after a fall.  Patient reports she was lifting a heavy crockpot, lost her balance and fell backwards flat onto her back.  She reports she does take Plavix.  She did hit her head.  She complains of mild neck pain, but primarily low back pain.  She reports she has had back surgery in the past and is scheduled for a revision in December.  No nausea or vomiting.  No abdominal pain.  No chest pain shortness of breath.  No extremity injuries reported.  Past Medical History:  Diagnosis Date  . Anemia    vitamin d deficiency  . Anxiety    ER visit on 10/15/19 for panic attack, sob, shaking  . Arthritis   . Bipolar 1 disorder ([place]) 2006  . Chronic kidney disease    stage III  . Depression   . Diabetes mellitus without complication ([place])    on insulin.  . Fibromyalgia   . GERD (gastroesophageal reflux disease)   . Hereditary essential tremor   . History of hiatal hernia   . Hodgkin's lymphoma ([place]) 2014   SKIN CANCER ALSO  . Hyperlipidemia   . Hypertension   . IBS (irritable bowel syndrome)   . IBS (irritable bowel syndrome)   . Incontinence   . Neuropathy   . Osteopenia   . RA (rheumatoid arthritis) ([place])   . Scoliosis   . Sleep apnea    uses cpap  . Stroke [facility]) 2019   several tia's prior to stroke.  on plavix  . Temporal arteritis [facility])     Patient Active Problem List   Diagnosis Date Noted  . Nephrolithiasis 03/21/2020  . Hypertension associated with diabetes ([place]) 12/21/2019  . History of back surgery 12/21/2019  . S/P lumbar fusion 11/03/2019  . Hereditary essential tremor 10/08/2019  . Polypharmacy 10/08/2019  . OSA on CPAP 09/01/2019  . Anticoagulant long-term use 09/01/2019  . DM type 2  with diabetic peripheral neuropathy ([place]) 08/26/2019  . Hyperparathyroidism due to renal insufficiency ([place]) 08/26/2019  . 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 Lumbar degenerative disc disease 06/08/2019  . Lumbar facet arthropathy 06/08/2019  . Chronic pain syndrome 06/08/2019  . Chronic kidney disease due to type 2 diabetes mellitus ([place]) 06/07/2019  . Benign essential HTN 04/15/2019  . Constipation 04/13/2019  . Vertigo 04/13/2019  . Hypertriglyceridemia 04/13/2019  . 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 Diabetic neuropathy ([place]) 02/15/2019  . History of CVA (cerebrovascular accident) 01/08/2019  . Incontinence   . RA (rheumatoid arthritis) ([place])   . History of temporal arteritis 01/01/2019  . Albuminuria 12/18/2018  . Dyslipidemia associated with type 2 diabetes mellitus ([place]) 12/11/2018  . 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 Anxiety   . Diverticulitis 05/05/2018  . Fibromyalgia 03/21/2016  . Irritable bowel syndrome 09/19/2014  . 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 GERD (gastroesophageal reflux disease) 04/03/2011  . Hiatal hernia 01/12/2007  . Bipolar 1 disorder ([place]) 07/29/2004    Past Surgical History:  Procedure Laterality Date  . BACK SURGERY    . CESAREAN SECTION    . ELBOW ARTHROSCOPY    . FINGER SURGERY    . HAND SURGERY    . HERNIA REPAIR     UMBILICAL REPAIR  .  LAMINECTOMY WITH POSTERIOR LATERAL ARTHRODESIS LEVEL 4 N/A 11/03/2019   Procedure: T11-L3 POSTERIOR FUSION, T12-L2 POSTERIOR COLUMN OSTEOTOMIES;  Surgeon: Meade Maw, MD;  Location: [facility] ORS;  Service: Neurosurgery;  Laterality: N/A;  . SPINE SURGERY      Prior to Admission medications   Medication Sig Start Date End Date Taking? Authorizing Provider  acetaminophen (TYLENOL) 500 MG tablet Take 1,000 mg by mouth every 6 (six) hours as needed for headache.    [provider]  albuterol (PROAIR HFA) 108 (90 Base) MCG/ACT inhaler Inhale 1 puff into the lungs 2 (two) times  daily as needed. Patient taking differently: Inhale 1 puff into the lungs 2 (two) times daily.  09/21/19   Delsa Grana, PA-C  alendronate (FOSAMAX) 70 MG tablet Take 70 mg by mouth every Monday.  08/25/19 08/24/20  [provider]  amLODipine (NORVASC) 10 MG tablet Take 1 tablet (10 mg total) by mouth daily. 05/08/20 08/06/20  Kate Sable, MD  bisacodyl (DULCOLAX) 5 MG EC tablet Take 5 mg by mouth daily as needed for moderate constipation.     [provider]  carbamazepine (TEGRETOL) 200 MG tablet Take 400 mg by mouth 3 (three) times daily. Brianne Klinger,OIC  04/27/18     clopidogrel (PLAVIX) 75 MG tablet Take 1 tablet (75 mg total) by mouth daily. 01/19/20   Steele Sizer, MD  Cyanocobalamin (B-12 COMPLIANCE INJECTION) 1000 MCG/ML KIT Inject 1,000 mcg as directed every 30 (thirty) days.    [provider]  docusate sodium (COLACE) 100 MG capsule Take 100 mg by mouth every other day.     [provider]  DULoxetine (CYMBALTA) 30 MG capsule Take 30 mg by mouth at bedtime. Take with 60 mg to equal 90 mg at bedtime 03/03/20   [provider]  DULoxetine (CYMBALTA) 60 MG capsule Take 60 mg by mouth daily. Take with 30 mg to equal 90 mg at bedtime    [provider]  empagliflozin (JARDIANCE) 25 MG TABS tablet Take 25 mg by mouth daily.     [provider]  EPINEPHrine (EPIPEN 2-PAK) 0.3 mg/0.3 mL IJ SOAJ injection Inject 0.3 mLs (0.3 mg total) into the muscle as needed for anaphylaxis. Then call 911 for ER transport. 09/21/19   Delsa Grana, PA-C  ferrous sulfate 325 (65 FE) MG tablet Take 325 mg by mouth daily.     [provider]  fluticasone-salmeterol (ADVAIR HFA) 230-21 MCG/ACT inhaler Inhale 2 puffs into the lungs 2 (two) times daily.    [provider]  gabapentin (NEURONTIN) 300 MG capsule Take 1 capsule (300 mg total) by mouth at bedtime. 12/29/19   Steele Sizer, MD  gemfibrozil (LOPID) 600 MG tablet Take 600 mg  by mouth 2 (two) times daily. 05/08/20   [provider]  insulin glargine (LANTUS) 100 UNIT/ML Solostar Pen Inject 80-90 Units into the skin at bedtime.     [provider]  lisinopril (ZESTRIL) 20 MG tablet Take 20 mg by mouth daily.  05/08/20   [provider]  loratadine (CLARITIN) 10 MG tablet Take 1 tablet (10 mg total) by mouth daily. Patient taking differently: Take 10 mg by mouth daily as needed for allergies.  01/01/19   Hubbard Hartshorn, FNP  LORazepam (ATIVAN) 1 MG tablet Take 1 mg by mouth See admin instructions. Take 1 mg at night, may take a second 1 mg dose during the day as needed for anxiety 06/15/18   [provider]  meclizine (ANTIVERT) 25  MG tablet Take 1 tablet (25 mg total) by mouth daily as needed for dizziness. At most once daily , must last 90 days Patient taking differently: Take 25 mg by mouth 3 (three) times daily.  03/31/20   Steele Sizer, MD  methocarbamol (ROBAXIN) 500 MG tablet Take 2 tablets (1,000 mg total) by mouth every 6 (six) hours. Patient taking differently: Take 500 mg by mouth at bedtime.  11/08/19   Marin Olp, PA-C  mirabegron ER (MYRBETRIQ) 50 MG TB24 tablet Take 1 tablet (50 mg total) by mouth daily. Patient taking differently: Take 50 mg by mouth at bedtime.  04/10/20   Billey Co, MD  montelukast (SINGULAIR) 10 MG tablet Take 10 mg by mouth daily as needed (allergies).    [provider]  NOVOLOG FLEXPEN 100 UNIT/ML FlexPen Inject 22-34 Units into the skin 3 (three) times daily. With meals Patient taking differently: Inject 14-22 Units into the skin 3 (three) times daily with meals.  10/08/19   Delsa Grana, PA-C  oxyCODONE-acetaminophen (PERCOCET/ROXICET) 5-325 MG tablet Take 1 tablet by mouth every 6 (six) hours as needed. Patient taking differently: Take 1 tablet by mouth every 6 (six) hours as needed for moderate pain.  03/11/20   Cuthriell, Charline Bills, PA-C  pantoprazole (PROTONIX) 40 MG tablet Take  1 tablet (40 mg total) by mouth daily. 08/05/19   Hubbard Hartshorn, FNP  primidone (MYSOLINE) 250 MG tablet Take 250 mg by mouth at bedtime.  04/28/20 04/28/21  [provider]  promethazine (PHENERGAN) 25 MG tablet Take 1 tablet (25 mg total) by mouth every 6 (six) hours as needed. Patient taking differently: Take 25 mg by mouth every 6 (six) hours as needed for nausea or vomiting.  09/21/19   Delsa Grana, PA-C  propranolol (INDERAL) 10 MG tablet Take 1 tablet (10 mg total) by mouth 2 (two) times daily. Patient taking differently: Take 10 mg by mouth daily.  08/05/19   Hubbard Hartshorn, FNP  rosuvastatin (CRESTOR) 20 MG tablet Take 1 tablet (20 mg total) by mouth daily. 04/06/20   Steele Sizer, MD  Simethicone 125 MG CAPS Take 125 mg by mouth daily as needed (gas).     [provider]  trimethoprim (TRIMPEX) 100 MG tablet Take 1 tablet (100 mg total) by mouth daily. Patient taking differently: Take 100 mg by mouth at bedtime.  04/10/20   Billey Co, MD     Allergies Bee venom, Fire ant, Other, Ciprofloxacin, Cyclobenzaprine, Diazepam, Ketorolac, Ondansetron, Ranitidine hcl, Ambien [zolpidem], Bacitracin-polymyxin b, Mushroom extract complex, and Neomycin-polymyxin-gramicidin  Family History  Problem Relation Age of Onset  . Lung cancer Mother   . Hypertension Mother   . Hypertension Sister   . Hypertension Brother   . Bone cancer Maternal Aunt   . Colon cancer Maternal Uncle     Social History Social History   Tobacco Use  . Smoking status: Never Smoker  . Smokeless tobacco: Never Used  Vaping Use  . Vaping Use: Never used  Substance Use Topics  . Alcohol use: Never  . Drug use: Never    Review of Systems  Constitutional: No fever/chills Eyes: No visual changes.  ENT: No sore throat. Cardiovascular: Denies chest pain. Respiratory: Denies shortness of breath. Gastrointestinal: No abdominal pain.  No nausea, no vomiting.   Genitourinary: Negative for  dysuria. Musculoskeletal: As above Skin: Negative for rash. Neurological: Negative for headaches    ____________________________________________   PHYSICAL EXAM:  VITAL SIGNS: ED Triage Vitals  Enc Vitals Group     BP 06/22/20 1920 112/79     Pulse Rate 06/22/20 1920 88     Resp 06/22/20 1920 15     Temp 06/22/20 1920 (!) 97.5 F (36.4 C)     Temp Source 06/22/20 1920 Oral     SpO2 06/22/20 1917 94 %     Weight 06/22/20 1921 82.6 kg (182 lb)     Height 06/22/20 1921 1.6 m (5' 3" )     Head Circumference --      Peak Flow --      Pain Score 06/22/20 1921 7     Pain Loc --      Pain Edu? --      Excl. in [place]? --     Constitutional: Alert and oriented.  Eyes: Conjunctivae are normal.  Head: Atraumatic. Nose: No congestion/rhinnorhea. Mouth/Throat: Mucous membranes are moist.   Neck: Mild tenderness around C6 Cardiovascular: Normal rate, regular rhythm. Grossly normal heart sounds.  Good peripheral circulation.  No chest wall tenderness palpation Respiratory: Normal respiratory effort.  No retractions. Gastrointestinal: Soft and nontender. No distention.  No CVA tenderness.  Musculoskeletal: Full range of motion of all extremities without pain.  Mild vertebral tenderness palpation L3-L5, no obvious bruising Neurologic:  Normal speech and language. No gross focal neurologic deficits are appreciated.  Skin:  Skin is warm, dry and intact.  Psychiatric: Mood and affect are normal. Speech and behavior are normal.  ____________________________________________   LABS (all labs ordered are listed, but only abnormal results are displayed)  Labs Reviewed - No data to display ____________________________________________  EKG  ED ECG REPORT I, Lavonia Drafts, the attending physician, personally viewed and interpreted this ECG.  Date: 06/22/2020  Rhythm: normal sinus rhythm QRS Axis: normal Intervals: normal ST/T Wave abnormalities: normal Narrative Interpretation: no  evidence of acute ischemia  ____________________________________________  RADIOLOGY  CT head, cervical spine reviewed by me, no apparent acute injury, pending radiology review Lumbar spine x-ray reviewed by me, pending radiology review Pelvic x-ray ____________________________________________   PROCEDURES  Procedure(s) performed: No  Procedures   Critical Care performed: No ____________________________________________   INITIAL IMPRESSION / ASSESSMENT AND PLAN / ED COURSE  Pertinent labs & imaging results that were available during my care of the patient were reviewed by me and considered in my medical decision making (see chart for details).  Patient presents after a fall.  Patient has a history of falls, she denies lightheadedness or palpitations or chest discomfort.  She states that she lost her balance which is not atypical for her.  She is on Plavix and did hit her head, has some neck discomfort as well status post fall, will obtain CT head and cervical spine  The majority of her pain is in the lumbar vertebrae will obtain lumbar x-rays as well as pelvic x-rays  No acute findings on lumbar spine films or pelvis x-ray  Patient is feeling improved after p.o. analgesics  Ct head and cervical spine without acute abnormality    ____________________________________________   FINAL CLINICAL IMPRESSION(S) / ED DIAGNOSES  Final diagnoses:  Fall, initial encounter  Back contusion, unspecified laterality, initial encounter  Injury of head, initial encounter  Fall in home, initial encounter        Note:  This document was prepared using Dragon voice recognition software and may include unintentional dictation errors.   Lavonia Drafts, MD 06/22/20 2045

## 2020-06-24 ENCOUNTER — Encounter: Payer: Self-pay | Admitting: Emergency Medicine

## 2020-06-24 ENCOUNTER — Emergency Department: Payer: Medicare Other

## 2020-06-24 ENCOUNTER — Other Ambulatory Visit: Payer: Self-pay

## 2020-06-24 ENCOUNTER — Emergency Department
Admission: EM | Admit: 2020-06-24 | Discharge: 2020-06-24 | Disposition: A | Discharge status: Home / Self Care | Payer: Medicare Other | Attending: Emergency Medicine | Admitting: Emergency Medicine

## 2020-06-24 DIAGNOSIS — W1839XA Other fall on same level, initial encounter: Secondary | ICD-10-CM | POA: Insufficient documentation

## 2020-06-24 DIAGNOSIS — Z79899 Other long term (current) drug therapy: Secondary | ICD-10-CM | POA: Insufficient documentation

## 2020-06-24 DIAGNOSIS — Z85828 Personal history of other malignant neoplasm of skin: Secondary | ICD-10-CM | POA: Diagnosis not present

## 2020-06-24 DIAGNOSIS — R52 Pain, unspecified: Secondary | ICD-10-CM | POA: Diagnosis not present

## 2020-06-24 DIAGNOSIS — E1122 Type 2 diabetes mellitus with diabetic chronic kidney disease: Secondary | ICD-10-CM | POA: Diagnosis not present

## 2020-06-24 DIAGNOSIS — S20212A Contusion of left front wall of thorax, initial encounter: Secondary | ICD-10-CM

## 2020-06-24 DIAGNOSIS — I10 Essential (primary) hypertension: Secondary | ICD-10-CM | POA: Diagnosis not present

## 2020-06-24 DIAGNOSIS — Z7984 Long term (current) use of oral hypoglycemic drugs: Secondary | ICD-10-CM | POA: Diagnosis not present

## 2020-06-24 DIAGNOSIS — Z794 Long term (current) use of insulin: Secondary | ICD-10-CM | POA: Diagnosis not present

## 2020-06-24 DIAGNOSIS — Z7983 Long term (current) use of bisphosphonates: Secondary | ICD-10-CM | POA: Diagnosis not present

## 2020-06-24 DIAGNOSIS — Y92009 Unspecified place in unspecified non-institutional (private) residence as the place of occurrence of the external cause: Secondary | ICD-10-CM | POA: Insufficient documentation

## 2020-06-24 DIAGNOSIS — Y9389 Activity, other specified: Secondary | ICD-10-CM | POA: Diagnosis not present

## 2020-06-24 DIAGNOSIS — N183 Chronic kidney disease, stage 3 unspecified: Secondary | ICD-10-CM | POA: Diagnosis not present

## 2020-06-24 DIAGNOSIS — E1140 Type 2 diabetes mellitus with diabetic neuropathy, unspecified: Secondary | ICD-10-CM | POA: Diagnosis not present

## 2020-06-24 DIAGNOSIS — S299XXA Unspecified injury of thorax, initial encounter: Secondary | ICD-10-CM | POA: Diagnosis present

## 2020-06-24 DIAGNOSIS — S239XXA Sprain of unspecified parts of thorax, initial encounter: Secondary | ICD-10-CM

## 2020-06-24 DIAGNOSIS — W19XXXA Unspecified fall, initial encounter: Secondary | ICD-10-CM | POA: Diagnosis not present

## 2020-06-24 DIAGNOSIS — R0781 Pleurodynia: Secondary | ICD-10-CM | POA: Diagnosis not present

## 2020-06-24 DIAGNOSIS — M25559 Pain in unspecified hip: Secondary | ICD-10-CM | POA: Diagnosis not present

## 2020-06-24 DIAGNOSIS — I129 Hypertensive chronic kidney disease with stage 1 through stage 4 chronic kidney disease, or unspecified chronic kidney disease: Secondary | ICD-10-CM | POA: Diagnosis not present

## 2020-06-24 DIAGNOSIS — S233XXA Sprain of ligaments of thoracic spine, initial encounter: Secondary | ICD-10-CM | POA: Insufficient documentation

## 2020-06-24 DIAGNOSIS — M549 Dorsalgia, unspecified: Secondary | ICD-10-CM | POA: Diagnosis not present

## 2020-06-24 LAB — URINALYSIS, COMPLETE (UACMP) WITH MICROSCOPIC
Bacteria, UA: NONE SEEN
Bilirubin Urine: NEGATIVE
Glucose, UA: >=500 mg/dL — AB
Hgb urine dipstick: NEGATIVE
Ketones, ur: NEGATIVE mg/dL
Nitrite: NEGATIVE
Protein, ur: 100 mg/dL — AB
Specific Gravity, Urine: 1.01 (ref 1.005–1.030)
Squamous Epithelial / HPF: NONE SEEN (ref 0–5)
WBC, UA: >50 WBC/hpf — ABNORMAL HIGH (ref 0–5)
pH: 5 (ref 5.0–8.0)

## 2020-06-24 MED ORDER — ORPHENADRINE CITRATE ER 100 MG PO TB12: 100.0000 mg | ORAL_TABLET | Freq: Two times a day (BID) | ORAL | 0 refills | Status: DC | PRN

## 2020-06-24 MED ORDER — ORPHENADRINE CITRATE 30 MG/ML IJ SOLN
60.0000 mg | INTRAMUSCULAR | Status: AC
  Administered 2020-06-24: 60 mg via INTRAMUSCULAR
  Filled 2020-06-24: qty 2

## 2020-06-24 MED ORDER — PREDNISONE 20 MG PO TABS: 40.0000 mg | ORAL_TABLET | Freq: Every day | ORAL | 0 refills | Status: AC

## 2020-06-24 MED ORDER — LIDOCAINE 5 % EX PTCH: 1.0000 | MEDICATED_PATCH | Freq: Two times a day (BID) | CUTANEOUS | 0 refills | Status: AC | PRN

## 2020-06-24 MED ORDER — PREDNISONE 20 MG PO TABS
60.0000 mg | ORAL_TABLET | Freq: Once | ORAL | Status: AC
  Administered 2020-06-24: 60 mg via ORAL
  Filled 2020-06-24: qty 3

## 2020-06-24 NOTE — Discharge Instructions (Addendum)
Your x-ray is negative for any injury to the lungs, or any obvious fracture to the ribs.  You may still have a small occult rib fracture which is not easily revealed on x-rays.  Take the prescription medications as provided.  Follow with your primary provider return to ED if needed.

## 2020-06-24 NOTE — ED Provider Notes (Signed)
[facility] Emergency Department Provider Note ____________________________________________  Time seen: 25  I have reviewed the triage vital signs and the nursing notes.  HISTORY  Chief Complaint  Back Pain and Rib Pain   HPI Victoria Austin is a 69 y.o. female presents to the ED via EMS from home, for evaluation of acute left-sided chest wall pain.   Patient been evaluated 2 days prior for mechanical fall at home. CT Imaging done of her spine, head, plain films of her lumbar spine and pelvis, all normal and reassuring at that time.  She has remote history of prior thoracolumbar fusion, with CT evidence from earlier this month of pedicle screw loosening.  Patient reports that this morning she got up to use the bathroom, and as she passed a bowel movement, she had a severe stent pop to her thoracic spine region.  Patient localizes pain when elicited to the rib cage on the left lateral posterior thorax.  She denies any lumbar sacral pain.  She describes the pain in the her thoracic spine, however, created pain in her lower back and hip.  Patient denied any frank fall related to the incident.  He denies any bladder/bowel incontinence, foot drop, or saddle anesthesia.  She denies any chest pain, shortness of breath, cough, congestion, or hemoptysis.  She notes an oxycodone about 1030 this morning, but just after the incident.  Patient presents today for evaluation of her injuries.  Past Medical History:  Diagnosis Date  . Anemia    vitamin d deficiency  . Anxiety    ER visit on 10/15/19 for panic attack, sob, shaking  . Arthritis   . Bipolar 1 disorder ([place]) 2006  . Chronic kidney disease    stage III  . Depression   . Diabetes mellitus without complication ([place])    on insulin.  . Fibromyalgia   . GERD (gastroesophageal reflux disease)   . Hereditary essential tremor   . History of hiatal hernia   . Hodgkin's lymphoma ([place]) 2014   SKIN CANCER ALSO  . Hyperlipidemia    . Hypertension   . IBS (irritable bowel syndrome)   . IBS (irritable bowel syndrome)   . Incontinence   . Neuropathy   . Osteopenia   . RA (rheumatoid arthritis) ([place])   . Scoliosis   . Sleep apnea    uses cpap  . Stroke [facility]) 2019   several tia's prior to stroke.  on plavix  . Temporal arteritis [facility])     Patient Active Problem List   Diagnosis Date Noted  . Nephrolithiasis 03/21/2020  . Hypertension associated with diabetes ([place]) 12/21/2019  . History of back surgery 12/21/2019  . S/P lumbar fusion 11/03/2019  . Hereditary essential tremor 10/08/2019  . Polypharmacy 10/08/2019  . OSA on CPAP 09/01/2019  . Anticoagulant long-term use 09/01/2019  . 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 Hyperparathyroidism due to renal insufficiency ([place]) 08/26/2019  . 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 Lumbar degenerative disc disease 06/08/2019  . Lumbar facet arthropathy 06/08/2019  . Chronic pain syndrome 06/08/2019  . Chronic kidney disease due to type 2 diabetes mellitus ([place]) 06/07/2019  . Benign essential HTN 04/15/2019  . Constipation 04/13/2019  . Vertigo 04/13/2019  . Hypertriglyceridemia 04/13/2019  . 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 Diabetic neuropathy ([place]) 02/15/2019  . History of CVA (cerebrovascular accident) 01/08/2019  . Incontinence   . RA (rheumatoid arthritis) ([place])   . History of temporal arteritis 01/01/2019  . Albuminuria  12/18/2018  . Dyslipidemia associated with type 2 diabetes mellitus ([place]) 12/11/2018  . 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 Anxiety   . Diverticulitis 05/05/2018  . Fibromyalgia 03/21/2016  . Irritable bowel syndrome 09/19/2014  . 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 GERD (gastroesophageal reflux disease) 04/03/2011  . Hiatal hernia 01/12/2007  . Bipolar 1 disorder ([place]) 07/29/2004    Past Surgical History:  Procedure Laterality Date  . BACK SURGERY    . CESAREAN SECTION    . ELBOW  ARTHROSCOPY    . FINGER SURGERY    . HAND SURGERY    . HERNIA REPAIR     UMBILICAL REPAIR  . LAMINECTOMY WITH POSTERIOR LATERAL ARTHRODESIS LEVEL 4 N/A 11/03/2019   Procedure: T11-L3 POSTERIOR FUSION, T12-L2 POSTERIOR COLUMN OSTEOTOMIES;  Surgeon: Meade Maw, MD;  Location: [facility] ORS;  Service: Neurosurgery;  Laterality: N/A;  . SPINE SURGERY      Prior to Admission medications   Medication Sig Start Date End Date Taking? Authorizing Provider  acetaminophen (TYLENOL) 500 MG tablet Take 1,000 mg by mouth every 6 (six) hours as needed for headache.    [provider]  albuterol (PROAIR HFA) 108 (90 Base) MCG/ACT inhaler Inhale 1 puff into the lungs 2 (two) times daily as needed. Patient taking differently: Inhale 1 puff into the lungs 2 (two) times daily.  09/21/19   Delsa Grana, PA-C  alendronate (FOSAMAX) 70 MG tablet Take 70 mg by mouth every Monday.  08/25/19 08/24/20  [provider]  amLODipine (NORVASC) 10 MG tablet Take 1 tablet (10 mg total) by mouth daily. 05/08/20 08/06/20  Kate Sable, MD  bisacodyl (DULCOLAX) 5 MG EC tablet Take 5 mg by mouth daily as needed for moderate constipation.     [provider]  carbamazepine (TEGRETOL) 200 MG tablet Take 400 mg by mouth 3 (three) times daily. Brianne Klinger,OIC  04/27/18     clopidogrel (PLAVIX) 75 MG tablet Take 1 tablet (75 mg total) by mouth daily. 01/19/20   Steele Sizer, MD  Cyanocobalamin (B-12 COMPLIANCE INJECTION) 1000 MCG/ML KIT Inject 1,000 mcg as directed every 30 (thirty) days.    [provider]  docusate sodium (COLACE) 100 MG capsule Take 100 mg by mouth every other day.     [provider]  DULoxetine (CYMBALTA) 30 MG capsule Take 30 mg by mouth at bedtime. Take with 60 mg to equal 90 mg at bedtime 03/03/20   [provider]  DULoxetine (CYMBALTA) 60 MG capsule Take 60 mg by mouth daily. Take with 30 mg to equal 90 mg at bedtime    [provider]   empagliflozin (JARDIANCE) 25 MG TABS tablet Take 25 mg by mouth daily.     [provider]  EPINEPHrine (EPIPEN 2-PAK) 0.3 mg/0.3 mL IJ SOAJ injection Inject 0.3 mLs (0.3 mg total) into the muscle as needed for anaphylaxis. Then call 911 for ER transport. 09/21/19   Delsa Grana, PA-C  ferrous sulfate 325 (65 FE) MG tablet Take 325 mg by mouth daily.     [provider]  fluticasone-salmeterol (ADVAIR HFA) 230-21 MCG/ACT inhaler Inhale 2 puffs into the lungs 2 (two) times daily.    [provider]  gabapentin (NEURONTIN) 300 MG capsule Take 1 capsule (300 mg total) by mouth at bedtime. 12/29/19   Steele Sizer, MD  gemfibrozil (LOPID) 600 MG tablet Take 600 mg by mouth 2 (two) times daily. 05/08/20   [provider]  insulin glargine (LANTUS) 100 UNIT/ML Solostar  Pen Inject 80-90 Units into the skin at bedtime.     [provider]  lidocaine (LIDODERM) 5 % Place 1 patch onto the skin every 12 (twelve) hours as needed for up to 10 days. Remove & Discard patch after 12 hours of wear each day. 06/24/20 07/04/20  Talyssa Gibas, Dannielle Karvonen, PA-C  lisinopril (ZESTRIL) 20 MG tablet Take 20 mg by mouth daily.  05/08/20   [provider]  loratadine (CLARITIN) 10 MG tablet Take 1 tablet (10 mg total) by mouth daily. Patient taking differently: Take 10 mg by mouth daily as needed for allergies.  01/01/19   Hubbard Hartshorn, FNP  LORazepam (ATIVAN) 1 MG tablet Take 1 mg by mouth See admin instructions. Take 1 mg at night, may take a second 1 mg dose during the day as needed for anxiety 06/15/18   [provider]  meclizine (ANTIVERT) 25 MG tablet Take 1 tablet (25 mg total) by mouth daily as needed for dizziness. At most once daily , must last 90 days Patient taking differently: Take 25 mg by mouth 3 (three) times daily.  03/31/20   Steele Sizer, MD  methocarbamol (ROBAXIN) 500 MG tablet Take 2 tablets (1,000 mg total) by mouth every 6 (six)  hours. Patient taking differently: Take 500 mg by mouth at bedtime.  11/08/19   Marin Olp, PA-C  mirabegron ER (MYRBETRIQ) 50 MG TB24 tablet Take 1 tablet (50 mg total) by mouth daily. Patient taking differently: Take 50 mg by mouth at bedtime.  04/10/20   Billey Co, MD  montelukast (SINGULAIR) 10 MG tablet Take 10 mg by mouth daily as needed (allergies).    [provider]  NOVOLOG FLEXPEN 100 UNIT/ML FlexPen Inject 22-34 Units into the skin 3 (three) times daily. With meals Patient taking differently: Inject 14-22 Units into the skin 3 (three) times daily with meals.  10/08/19   Delsa Grana, PA-C  orphenadrine (NORFLEX) 100 MG tablet Take 1 tablet (100 mg total) by mouth 2 (two) times daily as needed for muscle spasms. 06/24/20   Charlann Wayne, Dannielle Karvonen, PA-C  oxyCODONE-acetaminophen (PERCOCET/ROXICET) 5-325 MG tablet Take 1 tablet by mouth every 6 (six) hours as needed. Patient taking differently: Take 1 tablet by mouth every 6 (six) hours as needed for moderate pain.  03/11/20   Cuthriell, Charline Bills, PA-C  pantoprazole (PROTONIX) 40 MG tablet Take 1 tablet (40 mg total) by mouth daily. 08/05/19   Hubbard Hartshorn, FNP  predniSONE (DELTASONE) 20 MG tablet Take 2 tablets (40 mg total) by mouth daily with breakfast for 5 days. 06/24/20 06/29/20  Johnny Gorter, Dannielle Karvonen, PA-C  primidone (MYSOLINE) 250 MG tablet Take 250 mg by mouth at bedtime.  04/28/20 04/28/21  [provider]  promethazine (PHENERGAN) 25 MG tablet Take 1 tablet (25 mg total) by mouth every 6 (six) hours as needed. Patient taking differently: Take 25 mg by mouth every 6 (six) hours as needed for nausea or vomiting.  09/21/19   Delsa Grana, PA-C  propranolol (INDERAL) 10 MG tablet Take 1 tablet (10 mg total) by mouth 2 (two) times daily. Patient taking differently: Take 10 mg by mouth daily.  08/05/19   Hubbard Hartshorn, FNP  rosuvastatin (CRESTOR) 20 MG tablet Take 1 tablet (20 mg total) by mouth daily. 04/06/20    Steele Sizer, MD  Simethicone 125 MG CAPS Take 125 mg by mouth daily as needed (gas).     [provider]  trimethoprim (TRIMPEX) 100  MG tablet Take 1 tablet (100 mg total) by mouth daily. Patient taking differently: Take 100 mg by mouth at bedtime.  04/10/20   Billey Co, MD    Allergies Bee venom, Fire ant, Other, Ciprofloxacin, Cyclobenzaprine, Diazepam, Ketorolac, Ondansetron, Ranitidine hcl, Ambien [zolpidem], Bacitracin-polymyxin b, Mushroom extract complex, and Neomycin-polymyxin-gramicidin  Family History  Problem Relation Age of Onset  . Lung cancer Mother   . Hypertension Mother   . Hypertension Sister   . Hypertension Brother   . Bone cancer Maternal Aunt   . Colon cancer Maternal Uncle     Social History Social History   Tobacco Use  . Smoking status: Never Smoker  . Smokeless tobacco: Never Used  Vaping Use  . Vaping Use: Never used  Substance Use Topics  . Alcohol use: Never  . Drug use: Never    Review of Systems  Constitutional: Negative for fever. Eyes: Negative for visual changes. ENT: Negative for sore throat. Cardiovascular: Negative for chest pain. Respiratory: Negative for shortness of breath. Gastrointestinal: Negative for abdominal pain, vomiting and diarrhea. Genitourinary: Negative for dysuria. Musculoskeletal: Positive for left upper back and rib pain. Skin: Negative for rash. Neurological: Negative for headaches, focal weakness or numbness. ____________________________________________  PHYSICAL EXAM:  VITAL SIGNS: ED Triage Vitals  Enc Vitals Group     BP 06/24/20 1733 (!) 148/77     Pulse Rate 06/24/20 1733 78     Resp 06/24/20 1733 18     Temp 06/24/20 1733 97.6 F (36.4 C)     Temp Source 06/24/20 1733 Oral     SpO2 06/24/20 1733 98 %     Weight 06/24/20 1734 182 lb (82.6 kg)     Height 06/24/20 1734 _0  (1.6 m)     Head Circumference --      Peak Flow --      Pain Score 06/24/20 1734 10     Pain Loc --       Pain Edu? --      Excl. in [place]? --     Constitutional: Alert and oriented. Well appearing and in no distress. Head: Normocephalic and atraumatic. Eyes: Conjunctivae are normal. Normal extraocular movements Neck: Supple. Normal ROM without crepitus Cardiovascular: Normal rate, regular rhythm. Normal distal pulses. Respiratory: Normal respiratory effort. No wheezes/rales/rhonchi. Gastrointestinal: Soft and nontender. No distention. Musculoskeletal: Patient with some pain localized to the posterior thoracic spine and the left lateral chest wall.  Patient with normal hip flexion extension range on exam.  Normal knee exam distally.  Nontender with normal range of motion in all extremities.  Neurologic: Cranial nerves II-XII grossly intact.  Normal toe dorsiflexion on exam. Normal speech and language. No gross focal neurologic deficits are appreciated. Skin:  Skin is warm, dry and intact. No rash noted. Psychiatric: Mood and affect are normal. Patient exhibits appropriate insight and judgment. ____________________________________________   LABORATORY  Labs Reviewed  URINALYSIS, COMPLETE (UACMP) WITH MICROSCOPIC - Abnormal; Notable for the following components:      Result Value   Color, Urine YELLOW (*)    APPearance HAZY (*)    Glucose, UA >=500 (*)    Protein, ur 100 (*)    Leukocytes,Ua SMALL (*)    WBC, UA >50 (*)    All other components within normal limits  __________________________________________________  RADIOLOGY  DG Left Rib with CXR  IMPRESSION: No acute rib fracture.  Clear lungs. ____________________________________________  PROCEDURES  Norflex 60 mg IM Prednisone 60 mg PO  Procedures ____________________________________________  INITIAL IMPRESSION / ASSESSMENT AND PLAN / ED COURSE  Patient with ED evaluation of acute left-sided chest wall pain without respiratory distress.  Exam is concerning for possible musculoskeletal injury related to the patient's  mechanism or strain.  X-ray of the chest and rib are negative for any acute findings.  The patient however, is advised that she may still have a subacute fracture to the ribs.  She be treated for rib contusion and chest wall strain reports significant improvement of her pain after administration of IM Norflex.  She will be discharged with a small course of prednisone to take over the next 3 to 5 days.  She will continue with the muscle relaxant as needed.  She is encouraged to follow-up with primary provider or neurosurgeon as planned.  Margrett Kalb was evaluated in Emergency Department on 06/24/2020 for the symptoms described in the history of present illness. She was evaluated in the context of the global COVID-19 pandemic, which necessitated consideration that the patient might be at risk for infection with the SARS-CoV-2 virus that causes COVID-19. Institutional protocols and algorithms that pertain to the evaluation of patients at risk for COVID-19 are in a state of rapid change based on information released by regulatory bodies including the CDC and federal and state organizations. These policies and algorithms were followed during the patient's care in the ED. ____________________________________________  FINAL CLINICAL IMPRESSION(S) / ED DIAGNOSES  Final diagnoses:  Thoracic back sprain, initial encounter  Chest wall contusion, left, initial encounter      Melvenia Needles, PA-C 06/24/20 2229    Nance Pear, MD 06/24/20 [phone]

## 2020-06-24 NOTE — ED Notes (Signed)
Patient transported to X-ray 

## 2020-06-24 NOTE — ED Notes (Signed)
Pt may eat and drink per EDP. Pt given soda and crackers. Pt given phone to call family.

## 2020-06-24 NOTE — ED Triage Notes (Addendum)
Pt arrived via [facility] from home, pt states this morning she was on the toilet to have BM, pt states when she went to get up she felt a "pop" in her back, pt unable bear weight on the L side without a lot of pain.  Pt took oxycodone at 1030am.  Pt seen on 11/25 for fall.   Pt c/o L rib cage pain as well.

## 2020-06-28 ENCOUNTER — Encounter
Admission: RE | Admit: 2020-06-28 | Discharge: 2020-06-28 | Disposition: A | Discharge status: Home / Self Care | Payer: Medicare Other | Source: Ambulatory Visit | Attending: Neurosurgery | Admitting: Neurosurgery

## 2020-06-28 ENCOUNTER — Other Ambulatory Visit: Payer: Self-pay

## 2020-06-28 ENCOUNTER — Telehealth: Payer: Self-pay | Admitting: *Deleted

## 2020-06-28 VITALS — BP 129/92 | HR 85 | Resp 20 | Ht 63.0 in | Wt 181.4 lb

## 2020-06-28 DIAGNOSIS — R8290 Unspecified abnormal findings in urine: Secondary | ICD-10-CM | POA: Diagnosis not present

## 2020-06-28 DIAGNOSIS — Z01812 Encounter for preprocedural laboratory examination: Secondary | ICD-10-CM | POA: Diagnosis not present

## 2020-06-28 DIAGNOSIS — H81399 Other peripheral vertigo, unspecified ear: Secondary | ICD-10-CM | POA: Diagnosis not present

## 2020-06-28 DIAGNOSIS — N390 Urinary tract infection, site not specified: Secondary | ICD-10-CM

## 2020-06-28 DIAGNOSIS — W010XXA Fall on same level from slipping, tripping and stumbling without subsequent striking against object, initial encounter: Secondary | ICD-10-CM | POA: Diagnosis not present

## 2020-06-28 DIAGNOSIS — S2020XA Contusion of thorax, unspecified, initial encounter: Secondary | ICD-10-CM | POA: Diagnosis not present

## 2020-06-28 DIAGNOSIS — Z794 Long term (current) use of insulin: Secondary | ICD-10-CM | POA: Diagnosis not present

## 2020-06-28 DIAGNOSIS — I10 Essential (primary) hypertension: Secondary | ICD-10-CM | POA: Diagnosis not present

## 2020-06-28 DIAGNOSIS — E1169 Type 2 diabetes mellitus with other specified complication: Secondary | ICD-10-CM | POA: Diagnosis not present

## 2020-06-28 LAB — URINALYSIS, ROUTINE W REFLEX MICROSCOPIC
Bilirubin Urine: NEGATIVE
Glucose, UA: >=500 mg/dL — AB
Hgb urine dipstick: NEGATIVE
Ketones, ur: NEGATIVE mg/dL
Nitrite: NEGATIVE
Protein, ur: 100 mg/dL — AB
Specific Gravity, Urine: 1.015 (ref 1.005–1.030)
Squamous Epithelial / HPF: NONE SEEN (ref 0–5)
WBC, UA: >50 WBC/hpf — ABNORMAL HIGH (ref 0–5)
pH: 5 (ref 5.0–8.0)

## 2020-06-28 LAB — BASIC METABOLIC PANEL
Anion gap: 10 (ref 5–15)
BUN: 30 mg/dL — ABNORMAL HIGH (ref 8–23)
CO2: 23 mmol/L (ref 22–32)
Calcium: 9 mg/dL (ref 8.9–10.3)
Chloride: 102 mmol/L (ref 98–111)
Creatinine, Ser: 1.1 mg/dL — ABNORMAL HIGH (ref 0.44–1.00)
GFR, Estimated: 55 mL/min — ABNORMAL LOW (ref >60)
Glucose, Bld: 107 mg/dL — ABNORMAL HIGH (ref 70–99)
Potassium: 3.7 mmol/L (ref 3.5–5.1)
Sodium: 135 mmol/L (ref 135–145)

## 2020-06-28 LAB — CBC
HCT: 35.4 % — ABNORMAL LOW (ref 36.0–46.0)
Hemoglobin: 11.7 g/dL — ABNORMAL LOW (ref 12.0–15.0)
MCH: 30.4 pg (ref 26.0–34.0)
MCHC: 33.1 g/dL (ref 30.0–36.0)
MCV: 91.9 fL (ref 80.0–100.0)
Platelets: 297 10*3/uL (ref 150–400)
RBC: 3.85 MIL/uL — ABNORMAL LOW (ref 3.87–5.11)
RDW: 12.9 % (ref 11.5–15.5)
WBC: 7.6 10*3/uL (ref 4.0–10.5)
nRBC: 0 % (ref 0.0–0.2)

## 2020-06-28 LAB — PROTIME-INR
INR: 1 (ref 0.8–1.2)
Prothrombin Time: 12.8 seconds (ref 11.4–15.2)

## 2020-06-28 LAB — APTT: aPTT: 30 seconds (ref 24–36)

## 2020-06-28 LAB — SURGICAL PCR SCREEN
MRSA, PCR: NEGATIVE
Staphylococcus aureus: NEGATIVE

## 2020-06-28 NOTE — Patient Instructions (Addendum)
Your procedure is scheduled on:07/05/20- Wednesday Report to the Registration Desk on the 1st floor of the [facility]. To find out your arrival time, please call [phone] between 1PM - 3PM on: 07/04/20- Tuesday  REMEMBER: Instructions that are not followed completely may result in serious medical risk, up to and including death; or upon the discretion of your surgeon and anesthesiologist your surgery may need to be rescheduled.  Do not eat food after midnight the night before surgery.  No gum chewing, lozengers or hard candies.  You may however, drink CLEAR liquids up to 2 hours before you are scheduled to arrive for your surgery. Do not drink anything within 2 hours of your scheduled arrival time.  Type 1 and Type 2 diabetics should only drink water.  ITAKE THESE MEDICATIONS THE MORNING OF SURGERY WITH A SIP OF WATER:  - rosuvastatin (CRESTOR) 20 MG tablet - LORazepam (ATIVAN) 1 MG tablet if needed - oxyCODONE-acetaminophen (PERCOCET/ROXICET) 5-325 MG tablet if needed. - amLODipine (NORVASC) 10 MG tablet - carbamazepine (TEGRETOL) 200 MG tablet - gemfibrozil (LOPID) 600 MG tablet - pantoprazole (PROTONIX) 40 MG tablet, (take one the night before and one on the morning of surgery - helps to prevent nausea after surgery.) - propranolol (INDERAL) 10 MG tablet  - DULoxetine (CYMBALTA) 30 MG capsule , take with DULoxetine (CYMBALTA) 60 MG capsule  Use inhalers albuterol (PROAIR HFA) 108 (90 Base) MCG/ACT inhaler on the day of surgery and bring to the hospital.   Take 1/2 of usual insulin glargine (LANTUS) 100 UNIT/ML Solostar  dose the night before surgery and none on the morning of surgery.  Follow recommendations from Cardiologist, Pulmonologist or PCP regarding stopping Aspirin, Coumadin, Plavix, Eliquis, Pradaxa, or Pletal. Stop Plavix today 06/28/20 and do not restart until  07/20/20, per MD .  One week prior to surgery: Stop Anti-inflammatories (NSAIDS) such as Advil,  Aleve, Ibuprofen, Motrin, Naproxen, Naprosyn and Aspirin based products such as Excedrin, Goodys Powder, BC Powder.  Stop ANY OVER THE COUNTER supplements until after surgery. (However, you may continue taking Vitamin D, Vitamin B, and multivitamin up until the day before surgery.)  No Alcohol for 24 hours before or after surgery.  No Smoking including e-cigarettes for 24 hours prior to surgery.  No chewable tobacco products for at least 6 hours prior to surgery.  No nicotine patches on the day of surgery.  Do not use any "recreational" drugs for at least a week prior to your surgery.  Please be advised that the combination of cocaine and anesthesia may have negative outcomes, up to and including death. If you test positive for cocaine, your surgery will be cancelled.  On the morning of surgery brush your teeth with toothpaste and water, you may rinse your mouth with mouthwash if you wish. Do not swallow any toothpaste or mouthwash.  Do not wear jewelry, make-up, hairpins, clips or nail polish.  Do not wear lotions, powders, or perfumes.   Do not shave body from the neck down 48 hours prior to surgery just in case you cut yourself which could leave a site for infection.  Also, freshly shaved skin may become irritated if using the CHG soap.  Contact lenses, hearing aids and dentures may not be worn into surgery.  Do not bring valuables to the hospital. [facility] is not responsible for any missing/lost belongings or valuables.   Use CHG Soap or wipes as directed on instruction sheet.  Notify your doctor if there is any change in  your medical condition (cold, fever, infection).  Wear comfortable clothing (specific to your surgery type) to the hospital.  Plan for stool softeners for home use; pain medications have a tendency to cause constipation. You can also help prevent constipation by eating foods high in fiber such as fruits and vegetables and drinking plenty of fluids as your  diet allows.  After surgery, you can help prevent lung complications by doing breathing exercises.  Take deep breaths and cough every 1-2 hours. Your doctor may order a device called an Incentive Spirometer to help you take deep breaths. When coughing or sneezing, hold a pillow firmly against your incision with both hands. This is called "splinting." Doing this helps protect your incision. It also decreases belly discomfort.  If you are being admitted to the hospital overnight, leave your suitcase in the car. After surgery it may be brought to your room.  If you are being discharged the day of surgery, you will not be allowed to drive home. You will need a responsible adult (18 years or older) to drive you home and stay with you that night.   If you are taking public transportation, you will need to have a responsible adult (18 years or older) with you. Please confirm with your physician that it is acceptable to use public transportation.   Please call the [facility] at [phone] if you have any questions about these instructions.  Visitation Policy:  Patients undergoing a surgery or procedure may have one family member or support person with them as long as that person is not COVID-19 positive or experiencing its symptoms.  That person may remain in the waiting area during the procedure.  Inpatient Visitation Update:   In an effort to ensure the safety of our team members and our patients, we are implementing a change to our visitation policy:  Effective Monday, Aug. 9, at 7 a.m., inpatients will be allowed one support person.  o The support person may change daily.  o The support person must pass our screening, gel in and out, and wear a mask at all times, including in the patient's room.  o Patients must also wear a mask when staff or their support person are in the room.  o Masking is required regardless of vaccination status.  Systemwide, no visitors 17  or younger.

## 2020-06-28 NOTE — Telephone Encounter (Signed)
Dr. Brett Canales just saw this patient on 06/19/20. We are asked for preop clearance for back surgery. She is unable to complete 4.0 METS, so I couldn't clear her on the phone. She states that you cleared her for surgery during that visit, but I don't see it in your note. Can you please confirm she is cleared without additional testing?  Of note, she had a fall last week due to vertigo, denies palpitations, dyspnea, or chest pain.

## 2020-06-28 NOTE — Progress Notes (Signed)
  [facility] Perioperative Services: Pre-Admission/Anesthesia Testing  Abnormal Lab Notification  Date: 06/28/20  Name: Kyleena Scheirer MRN:   [id number]  Re: Abnormal labs noted during PAT appointment   Provider(s) Notified: Meade Maw, MD Notification mode: Routed and/or faxed via [facility]   ABNORMAL LAB VALUE(S): Lab Results  Component Value Date   COLORURINE YELLOW (A) 06/28/2020   APPEARANCEUR CLOUDY (A) 06/28/2020   LABSPEC 1.015 06/28/2020   PHURINE 5.0 06/28/2020   GLUCOSEU >=500 (A) 06/28/2020   HGBUR NEGATIVE 06/28/2020   BILIRUBINUR NEGATIVE 06/28/2020   KETONESUR NEGATIVE 06/28/2020   PROTEINUR 100 (A) 06/28/2020   UROBILINOGEN 0.2 02/04/2020   NITRITE NEGATIVE 06/28/2020   LEUKOCYTESUR LARGE (A) 06/28/2020   EPIU NONE SEEN 06/28/2020   WBCU >50 (H) 06/28/2020   RBCU 0-5 06/28/2020   BACTERIA RARE (A) 06/28/2020   ASSESSMENT and PLAN: Patient is scheduled for a T11-L4 POSTERIOR SPINAL FUSION; BONE MARROW ASPIRATION FOR SPINE FUSION ONLY (BMAC) on 07/05/2020. UA performed in PAT concerning for infection (>50 WBC/hpf, (+) large amount of LE, and (+) bacteria). There was no leukocytosis noted on CBC (WBC 7.6). Renal function slightly decreased; creatinine clearance: 49.8 mL/min. Patient endorses the fact that she has not been drinking as much as she normally does due to urinary frequency. Urine C&S added to assess for pathogenically significant growth.  Patient called and notified of the results. She reports that she is symptomatic with frequency, urgency, dysuria, and malodorous urine. She denies nausea, vomiting, and fevers. (+) mild suprapubic and lower back pain also reported. Given symptoms, will proceed with empiric treatment of urinary tract infection as follows:   Allergies reviewed. Will treat with a 5 day course of nitrofurantoin. Patient encouraged to complete the entire course of antibiotics even if she begins to feel better. She was  advised that if culture demonstrates resistance to the prescribed antibiotic, she will be contacted and advised of the need to change the antibiotic being used to treat her infection.    Patient encouraged to increase her fluid intake as much as possible. Discussed that water is always best to flush the urinary tract. She was advised to avoid caffeine containing fluids until her infection clears, as caffeine can cause her to experience painful bladder spasms.    May use Tylenol as needed for pain/fever.    If not improving, patient will need to follow up with PCP. Caveat given that if she develops fevers, nausea, vomiting, and/or severe abdominal pain while on ABX she will need to present to the ED for further evaluation.   Note forwarded to attending surgeon to make aware of current infection and plans for treatment. This is a Community education officer; no formal response is required.  ADDENDUM:  06/28/2020 at 1630 PM - unable to reach patient. Needing to confirm pharmacy as patient's listed pharmacy is in [place]. Spoke with patient's daughter. Learned that she had called her PCP with complaints of UTI symptoms. POC as outlined above already implemented my PCP's office; Rx for nitrofurantoin already called in. Will update surgeon's office.   Honor Loh, MSN, APRN, FNP-C, CEN [facility]  Peri-operative Services Nurse Practitioner Phone: [phone] Fax: [phone] 06/28/20 1458 PM

## 2020-06-28 NOTE — Telephone Encounter (Signed)
   [facility] Pre-operative Risk Assessment    HEARTCARE STAFF: - Please ensure there is not already an duplicate clearance open for this procedure. - Under Visit Info/Reason for Call, type in Other and utilize the format Clearance MM/DD/YY or Clearance TBD. Do not use dashes or single digits. - If request is for dental extraction, please clarify the # of teeth to be extracted.  Request for surgical clearance:  1. What type of surgery is being performed? T11-L4 POSTERIOR SPINAL FUSION; BONE MARROW ASPIRATION FOR SPINE FUSION ONLY (BMAC)   2. When is this surgery scheduled? 07/05/20   3. What type of clearance is required (medical clearance vs. Pharmacy clearance to hold med vs. Both)? MEDICAL  4. Are there any medications that need to be held prior to surgery and how long? PLAVIX   5. Practice name and name of physician performing surgery? DR. Meade Maw   6. What is the office phone number? [phone]   7.   What is the office fax number? [phone]  8.   Anesthesia type (None, local, MAC, general) ? GENERAL   Julaine Hua 06/28/2020, 9:50 AM  _________________________________________________________________   (provider comments below)

## 2020-06-29 DIAGNOSIS — F3132 Bipolar disorder, current episode depressed, moderate: Secondary | ICD-10-CM | POA: Diagnosis not present

## 2020-06-29 DIAGNOSIS — F411 Generalized anxiety disorder: Secondary | ICD-10-CM | POA: Diagnosis not present

## 2020-06-29 NOTE — Progress Notes (Signed)
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 Clinical nursing staff has updated patient's PMH/PSHx, current medication list, and drug allergies/intolerances to ensure comprehensive history available to assist in medical decision making as it pertains to the aforementioned surgical procedure and anticipated anesthetic course.   Clinical Discussion:  Victoria Austin is a 69 y.o. female who is submitted for pre-surgical anesthesia review and clearance prior to her undergoing the above procedure. Patient has never been a smoker. Pertinent PMH includes: CVA, HTN, HLD, T2DM, CKD-III, OSAH (? nocturnal PAP therapy), anemia, GERD, hiatal hernia, Hodgkin's lymphoma, fibromyalgia, lumbar DDD depression, anxiety, bipolar 1 disorder.  Patient is followed by cardiology Garen Lah, MD). She was last seen in the cardiology clinic on 06/19/2020; notes reviewed.  At the time of her clinic visit, patient was doing well overall.  Patient denied any episodes of angina, shortness of breath, orthopnea, PND, peripheral edema, palpitations, vertiginous symptoms, or presyncope/syncope.  Last myocardial perfusion imaging study performed on 05/25/2019 revealed no evidence of significant stress-induced ischemia; LVEF 55-65%.  There were coronary artery and aortic atherosclerotic applications noted on  attenuation correction CT (see full interpretation of cardiovascular testing below).  Patient suffered a CVA in 2019 and is currently on chronic anticoagulation using clopidogrel; compliant therapy with no evidence of bleeding reported. Blood pressure well controlled at 133/72 on current CCB and ACEi regimen.  Patient is on a statin and gemfibrozil for her HLD.  T2DM with moderate control; hemoglobin A1c 7.6% on 03/17/2020.  Functional capacity documented as being <4 METS.  There were no changes made to her medication regimen.  Patient to follow-up with outpatient cardiology in 3 months.   Patient was seen in the ED on 06/22/2020 following a mechanical fall; notes reviewed.  Patient reporting that she was lifting a heavy object and lost her balance causing her to fall backwards hitting her head.  Patient complained of neck pain and lower back pain.  Patient with no focal neurological symptoms.  Exam revealed vertebral TTP.  Diagnostic plain films of the lumbar spine and pelvis revealed no acute injuries and evidence of a remote direct lumbar fusion. CT imaging of the head and neck revealed focal soft tissue swelling of the left parietal scalp without underlying fracture and multilevel degenerative changes within the cervical spine; no acute fracture or traumatic subluxation.  Patient improved with oral analgesic medications.  She was ultimately discharged home in stable condition.   Patient seen again in the ED on 06/24/2020 for pain in her ribs and thoracic spine.  Patient reported that she felt a pop after passing a bowel movement earlier in the day.  Exam revealed no focal neurological concerns.  Diagnostic plain films of the ribs revealed no acute fracture and clear lungs bilaterally. Patient was treated with IM orphenadrine and oral steroids, which improved her symptoms.  Patient was discharged home on SMR and  steroid courses with instructions to follow-up with PCP and/or neurosurgery as  planned.  Patient scheduled to undergo lumbar spinal fusion procedure on 07/05/2020 with Dr. Meade Maw.  CT imaging of the thoracic and lumbar spine done on 06/01/2020 revealed mild lucency about the L3 pedicle screw suggesting loosening. Given patient's past medical history significant for CVA, presurgical cardiac clearance was sought by PAT team.  Per cardiology, "based on ACC/AHA guidelines, the patient would be at acceptable risk for the planned procedure without further cardiovascular testing". Again, this patient is on daily anticoagulation therapy, however it was not prescribed by cardiology, therefore recommendations were to obtain clearance to hold therapy from internal medicine. Neurosurgery requesting that patient hold her clopidogrel 1 week before and 2 weeks after her procedure (06/28/2020 - 07/19/2020). Internal medicine provider Venetia Maxon, PA-C) in agreement with the plans for patient's anticoagulation. Plans have been discussed with patient and she verbalizes understanding that  her last dose of her anticoagulant will be on 06/28/2020 and that, barring any complications, she will resume on 07/20/2020.  She denies previous perioperative complications with anesthesia. She underwent a general anesthetic course here (ASA III) in 10/2019 with no documented complications.   Vitals with BMI 06/28/2020 06/24/2020 06/24/2020  Height 5' 3"  - -  Weight 181 lbs 7 oz - -  BMI 98.92 - -  Systolic 119 417 408  Diastolic 92 88 82  Pulse 85 74 68  Some encounter information is confidential and restricted. Go to Review Flowsheets activity to see all data.    Providers/Specialists:   NOTE: Primary physician provider listed below. Patient may have been seen by APP or partner within same practice.   PROVIDER ROLE LAST Dola Factor, MD Neurosurgery  06/02/2020   Donnamarie Rossetti, PA-C Primary Care Provider  06/28/2020  Kate Sable, MD Cardiology  06/19/2020    Allergies:  Bee venom, Fire ant, Other, Ciprofloxacin, Cyclobenzaprine, Diazepam, Ketorolac, Ondansetron, Ranitidine hcl, Ambien [zolpidem], Bacitracin-polymyxin b, Mushroom extract complex, and Neomycin-polymyxin-gramicidin  Current Home Medications:   . cyanocobalamin ((VITAMIN B-12)) injection 1,000 mcg   . acetaminophen (TYLENOL) 500 MG tablet  . albuterol (PROAIR HFA) 108 (90 Base) MCG/ACT inhaler  . alendronate (FOSAMAX) 70 MG tablet  . amLODipine (NORVASC) 10 MG tablet  . bisacodyl (DULCOLAX) 5 MG EC tablet  . carbamazepine (TEGRETOL) 200 MG tablet  . clopidogrel (PLAVIX) 75 MG tablet  . Cyanocobalamin (B-12 COMPLIANCE INJECTION) 1000 MCG/ML KIT  . docusate sodium (COLACE) 100 MG capsule  . DULoxetine (CYMBALTA) 30 MG capsule  . DULoxetine (CYMBALTA) 60 MG capsule  . empagliflozin (JARDIANCE) 25 MG TABS tablet  . EPINEPHrine (EPIPEN 2-PAK) 0.3 mg/0.3 mL IJ SOAJ injection  . ferrous sulfate 325 (65 FE) MG tablet  . fluticasone-salmeterol (ADVAIR HFA) 230-21 MCG/ACT inhaler  . gabapentin (NEURONTIN) 300 MG capsule  . gemfibrozil (LOPID) 600 MG tablet  . insulin glargine (LANTUS) 100 UNIT/ML Solostar Pen  . lisinopril (ZESTRIL) 20 MG tablet  . loratadine (CLARITIN) 10 MG tablet  . LORazepam (ATIVAN) 1 MG tablet  . meclizine (ANTIVERT) 25 MG tablet  . methocarbamol (ROBAXIN) 500 MG tablet  . mirabegron ER (MYRBETRIQ) 50 MG TB24 tablet  . montelukast (SINGULAIR) 10 MG tablet  . NOVOLOG FLEXPEN 100 UNIT/ML FlexPen  . oxyCODONE-acetaminophen (PERCOCET/ROXICET) 5-325 MG tablet  . pantoprazole (PROTONIX) 40 MG tablet  . primidone (MYSOLINE) 250 MG tablet  . promethazine (PHENERGAN) 25 MG tablet  . propranolol (INDERAL) 10 MG tablet  . rosuvastatin (CRESTOR) 20 MG tablet  .  Simethicone 125 MG CAPS  . trimethoprim (TRIMPEX) 100 MG tablet  . lidocaine (LIDODERM) 5 %  . orphenadrine (NORFLEX) 100 MG tablet   History:   Past Medical History:  Diagnosis Date  . Anemia     vitamin d deficiency  . Anxiety    ER visit on 10/15/19 for panic attack, sob, shaking  . Arthritis    per patient she does not have RA, has osteoarthritis  . Bipolar 1 disorder ([place]) 2006  . Chronic kidney disease    stage III  . Depression   . Diabetes mellitus without complication ([place])    on insulin.  . Fibromyalgia   . GERD (gastroesophageal reflux disease)   . Hereditary essential tremor   . History of hiatal hernia   . Hodgkin's lymphoma ([place]) 2014   SKIN CANCER ALSO  . Hyperlipidemia   . Hypertension   . IBS (irritable bowel syndrome)   . IBS (irritable bowel syndrome)   . Incontinence   . Neuropathy   . Osteopenia   . RA (rheumatoid arthritis) ([place])   . Scoliosis   . Sleep apnea    uses cpap, per patient does not use cpap at all.  . Stroke ([place]) 2019   several tia's prior to stroke.  on plavix  . Temporal arteritis [facility])    Past Surgical History:  Procedure Laterality Date  . BACK SURGERY     10/2019, 10/2018  . CESAREAN SECTION    . ELBOW ARTHROSCOPY    . FINGER SURGERY    . HAND SURGERY    . HERNIA REPAIR     UMBILICAL REPAIR  . LAMINECTOMY WITH POSTERIOR LATERAL ARTHRODESIS LEVEL 4 N/A 11/03/2019   Procedure: T11-L3 POSTERIOR FUSION, T12-L2 POSTERIOR COLUMN OSTEOTOMIES;  Surgeon: Meade Maw, MD;  Location: [facility] ORS;  Service: Neurosurgery;  Laterality: N/A;  . SPINE SURGERY     Family History  Problem Relation Age of Onset  . Lung cancer Mother   . Hypertension Mother   . Hypertension Sister   . Hypertension Brother   . Bone cancer Maternal Aunt   . Colon cancer Maternal Uncle    Social History   Tobacco Use  . Smoking status: Never Smoker  . Smokeless tobacco: Never Used  Vaping Use  . Vaping Use: Never used  Substance Use Topics  . Alcohol use: Never  . Drug use: Never    Pertinent Clinical Results:  LABS: Labs reviewed: Acceptable for surgery.  (+) UTI noted. Patient is symptomatic. Currently being treated with 5-day course of  nitrofurantoin.  Attending surgeon is aware; see previous documentation from PAT APP.  Hospital Outpatient Visit on 06/28/2020  Component Date Value Ref Range Status  . aPTT 06/28/2020 30  24 - 36 seconds Final   Performed at [facility], [address] [place], [place] [address]  . Sodium 06/28/2020 135  135 - 145 mmol/L Final  . Potassium 06/28/2020 3.7  3.5 - 5.1 mmol/L Final  . Chloride 06/28/2020 102  98 - 111 mmol/L Final  . CO2 06/28/2020 23  22 - 32 mmol/L Final  . Glucose, Bld 06/28/2020 107* 70 - 99 mg/dL Final   Glucose reference range applies only to samples taken after fasting for at least 8 hours.  . BUN 06/28/2020 30* 8 - 23 mg/dL Final  . Creatinine, Ser 06/28/2020 1.10* 0.44 - 1.00 mg/dL Final  . Calcium 06/28/2020 9.0  8.9 - 10.3 mg/dL Final  . GFR, Estimated 06/28/2020 55* >60 mL/min Final  Comment: (NOTE) Calculated using the CKD-EPI Creatinine Equation (2021)   . Anion gap 06/28/2020 10  5 - 15 Final   Performed at [facility], [address] [place], [place] [address]  . WBC 06/28/2020 7.6  4.0 - 10.5 K/uL Final  . RBC 06/28/2020 3.85* 3.87 - 5.11 MIL/uL Final  . Hemoglobin 06/28/2020 11.7* 12.0 - 15.0 g/dL Final  . HCT 06/28/2020 35.4* 36 - 46 % Final  . MCV 06/28/2020 91.9  80.0 - 100.0 fL Final  . MCH 06/28/2020 30.4  26.0 - 34.0 pg Final  . MCHC 06/28/2020 33.1  30.0 - 36.0 g/dL Final  . RDW 06/28/2020 12.9  11.5 - 15.5 % Final  . Platelets 06/28/2020 297  150 - 400 K/uL Final  . nRBC 06/28/2020 0.0  0.0 - 0.2 % Final   Performed at [facility], [address] [place], [place] [address]  . Prothrombin Time 06/28/2020 12.8  11.4 - 15.2 seconds Final  . INR 06/28/2020 1.0  0.8 - 1.2 Final   Comment: (NOTE) INR goal varies based on device and disease states. Performed at [facility], [address] [place], [place] [address]   . MRSA, PCR 06/28/2020 NEGATIVE  NEGATIVE Final  . Staphylococcus aureus  06/28/2020 NEGATIVE  NEGATIVE Final   Comment: (NOTE) The Xpert SA Assay (FDA approved for NASAL specimens in patients 61 years of age and older), is one component of a comprehensive surveillance program. It is not intended to diagnose infection nor to guide or monitor treatment. Performed at [facility], [address] [place], [place] [address]   . ABO/RH(D) 06/28/2020 A POS   Final  . Antibody Screen 06/28/2020 NEG   Final  . Sample Expiration 06/28/2020    Final                   Value:07/01/2020,2359 Performed at [facility], [address], [place], [place] [address]   . Color, Urine 06/28/2020 YELLOW* YELLOW Final  . APPearance 06/28/2020 CLOUDY* CLEAR Final  . Specific Gravity, Urine 06/28/2020 1.015  1.005 - 1.030 Final  . pH 06/28/2020 5.0  5.0 - 8.0 Final  . Glucose, UA 06/28/2020 >=500* NEGATIVE mg/dL Final  . Hgb urine dipstick 06/28/2020 NEGATIVE  NEGATIVE Final  . Bilirubin Urine 06/28/2020 NEGATIVE  NEGATIVE Final  . Ketones, ur 06/28/2020 NEGATIVE  NEGATIVE mg/dL Final  . Protein, ur 06/28/2020 100* NEGATIVE mg/dL Final  . Nitrite 06/28/2020 NEGATIVE  NEGATIVE Final  . Leukocytes,Ua 06/28/2020 LARGE* NEGATIVE Final  . RBC / HPF 06/28/2020 0-5  0 - 5 RBC/hpf Final  . WBC, UA 06/28/2020 >50* 0 - 5 WBC/hpf Final  . Bacteria, UA 06/28/2020 RARE* NONE SEEN Final  . Squamous Epithelial / LPF 06/28/2020 NONE SEEN  0 - 5 Final  . WBC Clumps 06/28/2020 PRESENT   Final  . Mucus 06/28/2020 PRESENT   Final   Performed at [facility], [address], [place], [place] [address]  . Specimen Description 06/28/2020    Final                   Value:URINE, RANDOM Performed at [facility], [address] [place], [place] [address]   . Special Requests 06/28/2020    Final                   Value:NONE Performed at [facility], [address] [place], [place] [address]   . Culture  06/28/2020 *  Final                    Value:>=100,000 COLONIES/mL ESCHERICHIA COLI SUSCEPTIBILITIES TO FOLLOW Performed at [place] [address], [place], [place] [address]   . Report Status 06/28/2020 PENDING   Incomplete    ECG: Date: 06/22/2020 Time ECG obtained: 1920 PM Rate: 88 bpm Rhythm: sinus rhythm Axis (leads I and aVF): Left axis deviation Intervals: QRS 111 ms. QTc 492 ms. ST segment and T wave changes: No evidence of acute ST segment elevation or depression Comparison: Similar to previous tracing obtained on 05/08/2020   IMAGING / PROCEDURES: DIAGNOSTIC RIBS UNILATERAL W/CHEST LEFT done on 06/24/2020 1. No acute rib fracture 2. Clear lungs   CT HEAD AND CERVICAL SPINE WO CONTRAST done on 06/22/2020 1. Multilevel degenerative changes of the cervical spine are stable.  Facet degenerative changes and spurring are worse left than right at C3-4 and C4-5.   Uncovertebral and facet disease contribute to mild foraminal narrowing bilaterally at C5-6 and C6-7.  No acute fracture or traumatic subluxation in the cervical spine. 2. Focal soft tissue swelling in the left parietal scalp without underlying fracture 3. Stable atrophy and white matter disease. This likely reflects the sequela of chronic microvascular ischemia. 4. No acute intracranial abnormality or significant interval change.  DIAGNOSTIC RADIOGRAPHS PELVIS done on 06/22/2020 1. No evidence of pelvic fracture or diastases 2. No pelvic bone lesions are seen 3. No acute abnormality  DIAGNOSTIC RADIOGRAPHS LUMBAR SPINE done on 06/22/2020 1. No acute abnormality 2. Remote thoracolumbar fusion with L1 fracture; vertebral body heights are otherwise maintained 3. Atherosclerosis noted within the aorta  CT THORACIC AND LUMBAR SPINE W CONTRAST done on 06/01/2020 1. Interval T11-L3 posterior fusion with mild lucency about the L3 pedicle screws suggesting loosening.  2. No stenosis at the postoperative levels. 3. Unchanged mild-to-moderate  neural foraminal stenosis from L3-4 to L5-S1. 4. Upper thoracic facet arthrosis.  5. No thoracic disc herniation or stenosis. 6. Aortic Atherosclerosis   LEXISCAN done on 05/25/2019 1. LVEF 55-65% 2. No evidence of significant stress-induced ischemia or scar 3. Coronary artery and aortic atherosclerotic calcifications are noted on the attenuation correction CT 4. Calcified nodule seen in the left lower lobe 5. Indeterminate high density structure near the pancreatic head 6. This is a low risk study  Impression and Plan:  Victoria Austin has been referred for pre-anesthesia review and clearance prior to her undergoing the planned anesthetic and procedural courses. Available labs, pertinent testing, and imaging results were personally reviewed by me. This patient has been appropriately cleared by cardiology and internal medicine.   Based on clinical review performed today (06/30/20), barring any significant acute changes in the patient's overall condition, it is anticipated that she will be able to proceed with the planned surgical intervention. Any acute changes in clinical condition may necessitate her procedure being postponed and/or cancelled. Pre-surgical instructions were reviewed with the patient during her PAT appointment and questions were fielded by PAT clinical staff.  Honor Loh, MSN, APRN, FNP-C, CEN [facility]  Peri-operative Services Nurse Practitioner Phone: [phone] 06/30/20 9:24 AM  NOTE: This note has been prepared using Dragon dictation software. Despite my best ability to proofread, there is always the potential that unintentional transcriptional errors may still occur from this process.

## 2020-06-29 NOTE — Telephone Encounter (Signed)
   Primary Cardiologist: Kate Sable, MD  Chart reviewed as part of pre-operative protocol coverage. Patient was contacted 06/29/2020 in reference to pre-operative risk assessment for pending surgery as outlined below.  Rheta Hemmelgarn was last seen on 06/19/20 by Dr.Agbor-Etang .  Since that day, Sholonda Jobst has done well.  Per Dr. Garen Lah: Pt can undergo surgical procedure from a cardiac perspective without additional testing. Stress test 1 year ago showed no ischemia and normal EF. No current cardiac symptoms.  Therefore, based on ACC/AHA guidelines, the patient would be at acceptable risk for the planned procedure without further cardiovascular testing.   The patient was advised that if she develops new symptoms prior to surgery to contact our office to arrange for a follow-up visit, and she verbalized understanding.  I will route this recommendation to the requesting party via Epic fax function and remove from pre-op pool. Please call with questions.  Tami Lin Lucila Klecka, PA 06/29/2020, 8:08 AM

## 2020-07-01 LAB — URINE CULTURE: Culture: >=100000 — AB

## 2020-07-03 ENCOUNTER — Other Ambulatory Visit: Payer: Self-pay

## 2020-07-03 ENCOUNTER — Other Ambulatory Visit
Admission: RE | Admit: 2020-07-03 | Discharge: 2020-07-03 | Disposition: A | Discharge status: Home / Self Care | Payer: Medicare Other | Source: Ambulatory Visit | Attending: Neurosurgery | Admitting: Neurosurgery

## 2020-07-03 DIAGNOSIS — Z01812 Encounter for preprocedural laboratory examination: Secondary | ICD-10-CM | POA: Insufficient documentation

## 2020-07-03 DIAGNOSIS — K5900 Constipation, unspecified: Secondary | ICD-10-CM | POA: Diagnosis not present

## 2020-07-03 DIAGNOSIS — R1011 Right upper quadrant pain: Secondary | ICD-10-CM | POA: Diagnosis not present

## 2020-07-03 DIAGNOSIS — Z8673 Personal history of transient ischemic attack (TIA), and cerebral infarction without residual deficits: Secondary | ICD-10-CM | POA: Diagnosis not present

## 2020-07-03 DIAGNOSIS — W010XXA Fall on same level from slipping, tripping and stumbling without subsequent striking against object, initial encounter: Secondary | ICD-10-CM | POA: Diagnosis not present

## 2020-07-03 DIAGNOSIS — E538 Deficiency of other specified B group vitamins: Secondary | ICD-10-CM | POA: Diagnosis not present

## 2020-07-03 DIAGNOSIS — J8410 Pulmonary fibrosis, unspecified: Secondary | ICD-10-CM | POA: Diagnosis not present

## 2020-07-03 DIAGNOSIS — R109 Unspecified abdominal pain: Secondary | ICD-10-CM | POA: Diagnosis not present

## 2020-07-03 DIAGNOSIS — H81399 Other peripheral vertigo, unspecified ear: Secondary | ICD-10-CM | POA: Diagnosis not present

## 2020-07-03 DIAGNOSIS — R42 Dizziness and giddiness: Secondary | ICD-10-CM | POA: Diagnosis not present

## 2020-07-03 DIAGNOSIS — W19XXXD Unspecified fall, subsequent encounter: Secondary | ICD-10-CM | POA: Diagnosis not present

## 2020-07-03 DIAGNOSIS — Z20822 Contact with and (suspected) exposure to covid-19: Secondary | ICD-10-CM | POA: Insufficient documentation

## 2020-07-04 LAB — TYPE AND SCREEN
ABO/RH(D): A POS
Antibody Screen: NEGATIVE

## 2020-07-04 LAB — SARS CORONAVIRUS 2 (TAT 6-24 HRS): SARS Coronavirus 2: NEGATIVE

## 2020-07-05 ENCOUNTER — Other Ambulatory Visit: Payer: Self-pay

## 2020-07-05 ENCOUNTER — Inpatient Hospital Stay: Payer: Medicare Other | Admitting: Urgent Care

## 2020-07-05 ENCOUNTER — Encounter: Payer: Self-pay | Admitting: Neurosurgery

## 2020-07-05 ENCOUNTER — Inpatient Hospital Stay: Payer: Medicare Other

## 2020-07-05 ENCOUNTER — Encounter
Admission: RE | Disposition: A | Discharge status: Home / Self Care | Payer: Self-pay | Source: Home / Self Care | Attending: Neurosurgery

## 2020-07-05 ENCOUNTER — Inpatient Hospital Stay
Admission: RE | Admit: 2020-07-05 | Discharge: 2020-07-09 | DRG: 460 | Disposition: A | Discharge status: Home Health | Payer: Medicare Other | Attending: Neurosurgery | Admitting: Neurosurgery

## 2020-07-05 DIAGNOSIS — I129 Hypertensive chronic kidney disease with stage 1 through stage 4 chronic kidney disease, or unspecified chronic kidney disease: Secondary | ICD-10-CM | POA: Diagnosis present

## 2020-07-05 DIAGNOSIS — Z800 Family history of malignant neoplasm of digestive organs: Secondary | ICD-10-CM | POA: Diagnosis not present

## 2020-07-05 DIAGNOSIS — M419 Scoliosis, unspecified: Secondary | ICD-10-CM | POA: Diagnosis present

## 2020-07-05 DIAGNOSIS — E1142 Type 2 diabetes mellitus with diabetic polyneuropathy: Secondary | ICD-10-CM | POA: Diagnosis not present

## 2020-07-05 DIAGNOSIS — F319 Bipolar disorder, unspecified: Secondary | ICD-10-CM | POA: Diagnosis present

## 2020-07-05 DIAGNOSIS — Y829 Unspecified medical devices associated with adverse incidents: Secondary | ICD-10-CM | POA: Diagnosis present

## 2020-07-05 DIAGNOSIS — M1990 Unspecified osteoarthritis, unspecified site: Secondary | ICD-10-CM | POA: Diagnosis not present

## 2020-07-05 DIAGNOSIS — Z888 Allergy status to other drugs, medicaments and biological substances status: Secondary | ICD-10-CM | POA: Diagnosis not present

## 2020-07-05 DIAGNOSIS — M797 Fibromyalgia: Secondary | ICD-10-CM | POA: Diagnosis present

## 2020-07-05 DIAGNOSIS — E875 Hyperkalemia: Secondary | ICD-10-CM | POA: Diagnosis not present

## 2020-07-05 DIAGNOSIS — E785 Hyperlipidemia, unspecified: Secondary | ICD-10-CM | POA: Diagnosis not present

## 2020-07-05 DIAGNOSIS — I10 Essential (primary) hypertension: Secondary | ICD-10-CM | POA: Diagnosis not present

## 2020-07-05 DIAGNOSIS — Z882 Allergy status to sulfonamides status: Secondary | ICD-10-CM

## 2020-07-05 DIAGNOSIS — Z91030 Bee allergy status: Secondary | ICD-10-CM | POA: Diagnosis not present

## 2020-07-05 DIAGNOSIS — I700 Atherosclerosis of aorta: Secondary | ICD-10-CM | POA: Diagnosis not present

## 2020-07-05 DIAGNOSIS — Z801 Family history of malignant neoplasm of trachea, bronchus and lung: Secondary | ICD-10-CM

## 2020-07-05 DIAGNOSIS — M960 Pseudarthrosis after fusion or arthrodesis: Principal | ICD-10-CM | POA: Diagnosis present

## 2020-07-05 DIAGNOSIS — N183 Chronic kidney disease, stage 3 unspecified: Secondary | ICD-10-CM | POA: Diagnosis present

## 2020-07-05 DIAGNOSIS — Z9109 Other allergy status, other than to drugs and biological substances: Secondary | ICD-10-CM | POA: Diagnosis not present

## 2020-07-05 DIAGNOSIS — E1165 Type 2 diabetes mellitus with hyperglycemia: Secondary | ICD-10-CM | POA: Diagnosis not present

## 2020-07-05 DIAGNOSIS — Z8249 Family history of ischemic heart disease and other diseases of the circulatory system: Secondary | ICD-10-CM

## 2020-07-05 DIAGNOSIS — M4800 Spinal stenosis, site unspecified: Secondary | ICD-10-CM | POA: Diagnosis present

## 2020-07-05 DIAGNOSIS — Z0389 Encounter for observation for other suspected diseases and conditions ruled out: Secondary | ICD-10-CM | POA: Diagnosis not present

## 2020-07-05 DIAGNOSIS — I9581 Postprocedural hypotension: Secondary | ICD-10-CM | POA: Diagnosis not present

## 2020-07-05 DIAGNOSIS — E1140 Type 2 diabetes mellitus with diabetic neuropathy, unspecified: Secondary | ICD-10-CM | POA: Diagnosis present

## 2020-07-05 DIAGNOSIS — Z8673 Personal history of transient ischemic attack (TIA), and cerebral infarction without residual deficits: Secondary | ICD-10-CM

## 2020-07-05 DIAGNOSIS — Z20822 Contact with and (suspected) exposure to covid-19: Secondary | ICD-10-CM | POA: Diagnosis present

## 2020-07-05 DIAGNOSIS — E1169 Type 2 diabetes mellitus with other specified complication: Secondary | ICD-10-CM | POA: Diagnosis present

## 2020-07-05 DIAGNOSIS — Z981 Arthrodesis status: Secondary | ICD-10-CM | POA: Diagnosis not present

## 2020-07-05 DIAGNOSIS — Z79899 Other long term (current) drug therapy: Secondary | ICD-10-CM

## 2020-07-05 DIAGNOSIS — E1122 Type 2 diabetes mellitus with diabetic chronic kidney disease: Secondary | ICD-10-CM | POA: Diagnosis present

## 2020-07-05 DIAGNOSIS — M5416 Radiculopathy, lumbar region: Secondary | ICD-10-CM | POA: Diagnosis present

## 2020-07-05 DIAGNOSIS — N390 Urinary tract infection, site not specified: Secondary | ICD-10-CM | POA: Diagnosis present

## 2020-07-05 DIAGNOSIS — M4856XA Collapsed vertebra, not elsewhere classified, lumbar region, initial encounter for fracture: Secondary | ICD-10-CM | POA: Diagnosis present

## 2020-07-05 DIAGNOSIS — Z794 Long term (current) use of insulin: Secondary | ICD-10-CM

## 2020-07-05 DIAGNOSIS — Z85828 Personal history of other malignant neoplasm of skin: Secondary | ICD-10-CM

## 2020-07-05 DIAGNOSIS — M069 Rheumatoid arthritis, unspecified: Secondary | ICD-10-CM | POA: Diagnosis present

## 2020-07-05 DIAGNOSIS — F419 Anxiety disorder, unspecified: Secondary | ICD-10-CM | POA: Diagnosis present

## 2020-07-05 DIAGNOSIS — Y838 Other surgical procedures as the cause of abnormal reaction of the patient, or of later complication, without mention of misadventure at the time of the procedure: Secondary | ICD-10-CM | POA: Diagnosis present

## 2020-07-05 DIAGNOSIS — G4733 Obstructive sleep apnea (adult) (pediatric): Secondary | ICD-10-CM | POA: Diagnosis not present

## 2020-07-05 DIAGNOSIS — T84098A Other mechanical complication of other internal joint prosthesis, initial encounter: Secondary | ICD-10-CM | POA: Diagnosis present

## 2020-07-05 DIAGNOSIS — K219 Gastro-esophageal reflux disease without esophagitis: Secondary | ICD-10-CM | POA: Diagnosis present

## 2020-07-05 DIAGNOSIS — Z8571 Personal history of Hodgkin lymphoma: Secondary | ICD-10-CM

## 2020-07-05 DIAGNOSIS — Z7983 Long term (current) use of bisphosphonates: Secondary | ICD-10-CM

## 2020-07-05 DIAGNOSIS — M4326 Fusion of spine, lumbar region: Secondary | ICD-10-CM | POA: Diagnosis not present

## 2020-07-05 DIAGNOSIS — M8580 Other specified disorders of bone density and structure, unspecified site: Secondary | ICD-10-CM | POA: Diagnosis present

## 2020-07-05 DIAGNOSIS — Z419 Encounter for procedure for purposes other than remedying health state, unspecified: Secondary | ICD-10-CM

## 2020-07-05 DIAGNOSIS — B9620 Unspecified Escherichia coli [E. coli] as the cause of diseases classified elsewhere: Secondary | ICD-10-CM | POA: Diagnosis present

## 2020-07-05 DIAGNOSIS — G250 Essential tremor: Secondary | ICD-10-CM | POA: Diagnosis present

## 2020-07-05 DIAGNOSIS — D62 Acute posthemorrhagic anemia: Secondary | ICD-10-CM | POA: Diagnosis not present

## 2020-07-05 DIAGNOSIS — Z9119 Patient's noncompliance with other medical treatment and regimen: Secondary | ICD-10-CM

## 2020-07-05 HISTORY — PX: LAMINECTOMY WITH POSTERIOR LATERAL ARTHRODESIS LEVEL 4: SHX6338

## 2020-07-05 LAB — GLUCOSE, CAPILLARY
Glucose-Capillary: 168 mg/dL — ABNORMAL HIGH (ref 70–99)
Glucose-Capillary: 199 mg/dL — ABNORMAL HIGH (ref 70–99)
Glucose-Capillary: 232 mg/dL — ABNORMAL HIGH (ref 70–99)

## 2020-07-05 LAB — CBC
HCT: 30.5 % — ABNORMAL LOW (ref 36.0–46.0)
Hemoglobin: 9.8 g/dL — ABNORMAL LOW (ref 12.0–15.0)
MCH: 30.1 pg (ref 26.0–34.0)
MCHC: 32.1 g/dL (ref 30.0–36.0)
MCV: 93.6 fL (ref 80.0–100.0)
Platelets: 307 10*3/uL (ref 150–400)
RBC: 3.26 MIL/uL — ABNORMAL LOW (ref 3.87–5.11)
RDW: 13.2 % (ref 11.5–15.5)
WBC: 12.6 10*3/uL — ABNORMAL HIGH (ref 4.0–10.5)
nRBC: 0 % (ref 0.0–0.2)

## 2020-07-05 LAB — POCT I-STAT, CHEM 8
BUN: 31 mg/dL — ABNORMAL HIGH (ref 8–23)
Calcium, Ion: 1.15 mmol/L (ref 1.15–1.40)
Chloride: 106 mmol/L (ref 98–111)
Creatinine, Ser: 1 mg/dL (ref 0.44–1.00)
Glucose, Bld: 166 mg/dL — ABNORMAL HIGH (ref 70–99)
HCT: 34 % — ABNORMAL LOW (ref 36.0–46.0)
Hemoglobin: 11.6 g/dL — ABNORMAL LOW (ref 12.0–15.0)
Potassium: 4.9 mmol/L (ref 3.5–5.1)
Sodium: 142 mmol/L (ref 135–145)
TCO2: 25 mmol/L (ref 22–32)

## 2020-07-05 SURGERY — LAMINECTOMY WITH POSTERIOR LATERAL ARTHRODESIS LEVEL 4
Anesthesia: General

## 2020-07-05 MED ORDER — ACETAMINOPHEN 10 MG/ML IV SOLN
INTRAVENOUS | Status: AC
  Filled 2020-07-05: qty 100

## 2020-07-05 MED ORDER — BUPIVACAINE HCL (PF) 0.5 % IJ SOLN
INTRAMUSCULAR | Status: DC | PRN
  Administered 2020-07-05: 20 mL

## 2020-07-05 MED ORDER — INSULIN GLARGINE 100 UNIT/ML ~~LOC~~ SOLN
25.0000 [IU] | Freq: Every day | SUBCUTANEOUS | Status: DC
  Administered 2020-07-05: 25 [IU] via SUBCUTANEOUS
  Filled 2020-07-05 (×2): qty 0.25

## 2020-07-05 MED ORDER — THROMBIN 5000 UNITS EX SOLR
CUTANEOUS | Status: DC | PRN
  Administered 2020-07-05: 5000 [IU] via TOPICAL

## 2020-07-05 MED ORDER — FENTANYL CITRATE (PF) 100 MCG/2ML IJ SOLN
INTRAMUSCULAR | Status: DC | PRN
  Administered 2020-07-05 (×3): 50 ug via INTRAVENOUS

## 2020-07-05 MED ORDER — SUGAMMADEX SODIUM 200 MG/2ML IV SOLN
INTRAVENOUS | Status: DC | PRN
  Administered 2020-07-05: 200 mg via INTRAVENOUS

## 2020-07-05 MED ORDER — ENOXAPARIN SODIUM 40 MG/0.4ML ~~LOC~~ SOLN: 40.0000 mg | SUBCUTANEOUS | Status: DC

## 2020-07-05 MED ORDER — OXYCODONE HCL 5 MG PO TABS
5.0000 mg | ORAL_TABLET | ORAL | Status: DC | PRN
  Administered 2020-07-07: 5 mg via ORAL
  Filled 2020-07-05 (×2): qty 1

## 2020-07-05 MED ORDER — LISINOPRIL 20 MG PO TABS: 20.0000 mg | ORAL_TABLET | Freq: Every day | ORAL | Status: DC

## 2020-07-05 MED ORDER — ALUM & MAG HYDROXIDE-SIMETH 200-200-20 MG/5ML PO SUSP: 30.0000 mL | Freq: Four times a day (QID) | ORAL | Status: DC | PRN

## 2020-07-05 MED ORDER — PROPOFOL 10 MG/ML IV BOLUS
INTRAVENOUS | Status: AC
  Filled 2020-07-05: qty 20

## 2020-07-05 MED ORDER — PRIMIDONE 250 MG PO TABS
250.0000 mg | ORAL_TABLET | Freq: Every day | ORAL | Status: DC
  Administered 2020-07-06 – 2020-07-08 (×3): 250 mg via ORAL
  Filled 2020-07-05 (×7): qty 1

## 2020-07-05 MED ORDER — EPHEDRINE 5 MG/ML INJ
INTRAVENOUS | Status: AC
  Filled 2020-07-05: qty 10

## 2020-07-05 MED ORDER — HEPARIN SODIUM (PORCINE) 10000 UNIT/ML IJ SOLN
INTRAMUSCULAR | Status: DC | PRN
  Administered 2020-07-05: 10000 [IU] via SUBCUTANEOUS

## 2020-07-05 MED ORDER — SODIUM CHLORIDE 0.9 % IV SOLN
INTRAVENOUS | Status: DC | PRN
  Administered 2020-07-05: 35 ug/min via INTRAVENOUS

## 2020-07-05 MED ORDER — ALBUTEROL SULFATE HFA 108 (90 BASE) MCG/ACT IN AERS: 1.0000 | INHALATION_SPRAY | Freq: Two times a day (BID) | RESPIRATORY_TRACT | Status: DC

## 2020-07-05 MED ORDER — PHENOL 1.4 % MT LIQD
1.0000 | OROMUCOSAL | Status: DC | PRN
  Filled 2020-07-05: qty 177

## 2020-07-05 MED ORDER — PHENYLEPHRINE HCL (PRESSORS) 10 MG/ML IV SOLN
INTRAVENOUS | Status: AC
  Filled 2020-07-05: qty 2

## 2020-07-05 MED ORDER — CHLORHEXIDINE GLUCONATE 0.12 % MT SOLN
15.0000 mL | Freq: Once | OROMUCOSAL | Status: AC
  Administered 2020-07-05: 15 mL via OROMUCOSAL

## 2020-07-05 MED ORDER — PANTOPRAZOLE SODIUM 40 MG PO TBEC
40.0000 mg | DELAYED_RELEASE_TABLET | Freq: Every day | ORAL | Status: DC
  Administered 2020-07-06 – 2020-07-09 (×4): 40 mg via ORAL
  Filled 2020-07-05 (×4): qty 1

## 2020-07-05 MED ORDER — GLYCOPYRROLATE 0.2 MG/ML IJ SOLN
INTRAMUSCULAR | Status: DC | PRN
  Administered 2020-07-05: .1 mg via INTRAVENOUS

## 2020-07-05 MED ORDER — DULOXETINE HCL 60 MG PO CPEP
60.0000 mg | ORAL_CAPSULE | Freq: Every day | ORAL | Status: DC
  Administered 2020-07-06 – 2020-07-07 (×2): 60 mg via ORAL
  Filled 2020-07-05 (×2): qty 1

## 2020-07-05 MED ORDER — DOCUSATE SODIUM 100 MG PO CAPS
100.0000 mg | ORAL_CAPSULE | Freq: Two times a day (BID) | ORAL | Status: DC
  Administered 2020-07-05 – 2020-07-09 (×8): 100 mg via ORAL
  Filled 2020-07-05 (×7): qty 1

## 2020-07-05 MED ORDER — ROCURONIUM BROMIDE 100 MG/10ML IV SOLN
INTRAVENOUS | Status: DC | PRN
  Administered 2020-07-05 (×2): 30 mg via INTRAVENOUS
  Administered 2020-07-05: 50 mg via INTRAVENOUS
  Administered 2020-07-05 (×3): 20 mg via INTRAVENOUS

## 2020-07-05 MED ORDER — INSULIN ASPART 100 UNIT/ML ~~LOC~~ SOLN
0.0000 [IU] | Freq: Three times a day (TID) | SUBCUTANEOUS | Status: DC
  Administered 2020-07-06: 2 [IU] via SUBCUTANEOUS
  Administered 2020-07-06 (×2): 5 [IU] via SUBCUTANEOUS
  Administered 2020-07-07: 3 [IU] via SUBCUTANEOUS
  Administered 2020-07-07: 5 [IU] via SUBCUTANEOUS
  Administered 2020-07-08: 3 [IU] via SUBCUTANEOUS
  Administered 2020-07-09: 2 [IU] via SUBCUTANEOUS
  Filled 2020-07-05 (×7): qty 1

## 2020-07-05 MED ORDER — ROCURONIUM BROMIDE 10 MG/ML (PF) SYRINGE
PREFILLED_SYRINGE | INTRAVENOUS | Status: AC
  Filled 2020-07-05: qty 10

## 2020-07-05 MED ORDER — MENTHOL 3 MG MT LOZG
1.0000 | LOZENGE | OROMUCOSAL | Status: DC | PRN
  Filled 2020-07-05: qty 9

## 2020-07-05 MED ORDER — PROMETHAZINE HCL 25 MG/ML IJ SOLN: 6.2500 mg | Freq: Four times a day (QID) | INTRAMUSCULAR | Status: DC | PRN

## 2020-07-05 MED ORDER — GABAPENTIN 300 MG PO CAPS
300.0000 mg | ORAL_CAPSULE | Freq: Every day | ORAL | Status: DC
  Administered 2020-07-05 – 2020-07-08 (×4): 300 mg via ORAL
  Filled 2020-07-05 (×5): qty 1

## 2020-07-05 MED ORDER — METHOCARBAMOL 500 MG PO TABS
750.0000 mg | ORAL_TABLET | Freq: Four times a day (QID) | ORAL | Status: DC
  Administered 2020-07-06 – 2020-07-07 (×6): 750 mg via ORAL
  Filled 2020-07-05 (×10): qty 2

## 2020-07-05 MED ORDER — GEMFIBROZIL 600 MG PO TABS
600.0000 mg | ORAL_TABLET | Freq: Two times a day (BID) | ORAL | Status: DC
  Administered 2020-07-05 – 2020-07-06 (×2): 600 mg via ORAL
  Filled 2020-07-05 (×4): qty 1

## 2020-07-05 MED ORDER — SODIUM CHLORIDE 0.9% FLUSH
3.0000 mL | Freq: Two times a day (BID) | INTRAVENOUS | Status: DC
  Administered 2020-07-05 – 2020-07-09 (×6): 3 mL via INTRAVENOUS

## 2020-07-05 MED ORDER — EPHEDRINE SULFATE 50 MG/ML IJ SOLN
INTRAMUSCULAR | Status: DC | PRN
  Administered 2020-07-05 (×3): 5 mg via INTRAVENOUS

## 2020-07-05 MED ORDER — CEFAZOLIN SODIUM-DEXTROSE 2-4 GM/100ML-% IV SOLN
INTRAVENOUS | Status: AC
  Filled 2020-07-05: qty 100

## 2020-07-05 MED ORDER — ALBUTEROL SULFATE (2.5 MG/3ML) 0.083% IN NEBU: 2.5000 mg | INHALATION_SOLUTION | Freq: Two times a day (BID) | RESPIRATORY_TRACT | Status: DC

## 2020-07-05 MED ORDER — DEXAMETHASONE SODIUM PHOSPHATE 10 MG/ML IJ SOLN
INTRAMUSCULAR | Status: DC | PRN
  Administered 2020-07-05: 10 mg via INTRAVENOUS

## 2020-07-05 MED ORDER — AMLODIPINE BESYLATE 10 MG PO TABS: 10.0000 mg | ORAL_TABLET | Freq: Every day | ORAL | Status: DC

## 2020-07-05 MED ORDER — DEXMEDETOMIDINE (PRECEDEX) IN NS 20 MCG/5ML (4 MCG/ML) IV SYRINGE
PREFILLED_SYRINGE | INTRAVENOUS | Status: AC
  Filled 2020-07-05: qty 5

## 2020-07-05 MED ORDER — SENNA 8.6 MG PO TABS
2.0000 | ORAL_TABLET | Freq: Two times a day (BID) | ORAL | Status: DC
  Administered 2020-07-05 – 2020-07-09 (×8): 17.2 mg via ORAL
  Filled 2020-07-05 (×8): qty 2

## 2020-07-05 MED ORDER — MIDAZOLAM HCL 2 MG/2ML IJ SOLN
INTRAMUSCULAR | Status: DC | PRN
  Administered 2020-07-05: 2 mg via INTRAVENOUS

## 2020-07-05 MED ORDER — ENOXAPARIN SODIUM 40 MG/0.4ML ~~LOC~~ SOLN
0.5000 mg/kg | SUBCUTANEOUS | Status: DC
  Administered 2020-07-06 – 2020-07-09 (×4): 40 mg via SUBCUTANEOUS
  Filled 2020-07-05 (×4): qty 0.4

## 2020-07-05 MED ORDER — HYDROMORPHONE HCL 1 MG/ML IJ SOLN: 0.5000 mg | INTRAMUSCULAR | Status: DC | PRN

## 2020-07-05 MED ORDER — SODIUM CHLORIDE 0.9 % IV SOLN: INTRAVENOUS | Status: DC

## 2020-07-05 MED ORDER — SODIUM CHLORIDE 0.9 % IV SOLN
50.0000 mL/h | INTRAVENOUS | Status: DC
  Administered 2020-07-06: 50 mL/h via INTRAVENOUS

## 2020-07-05 MED ORDER — VANCOMYCIN HCL 1250 MG/250ML IV SOLN
1250.0000 mg | Freq: Once | INTRAVENOUS | Status: AC
  Administered 2020-07-05: 1250 mg via INTRAVENOUS
  Filled 2020-07-05: qty 250

## 2020-07-05 MED ORDER — FLEET ENEMA 7-19 GM/118ML RE ENEM: 1.0000 | ENEMA | Freq: Once | RECTAL | Status: DC | PRN

## 2020-07-05 MED ORDER — SODIUM CHLORIDE 0.9% FLUSH: 3.0000 mL | INTRAVENOUS | Status: DC | PRN

## 2020-07-05 MED ORDER — CEFAZOLIN SODIUM-DEXTROSE 2-4 GM/100ML-% IV SOLN
2.0000 g | Freq: Once | INTRAVENOUS | Status: AC
  Administered 2020-07-05: 2 g via INTRAVENOUS

## 2020-07-05 MED ORDER — DULOXETINE HCL 60 MG PO CPEP
90.0000 mg | ORAL_CAPSULE | Freq: Every day | ORAL | Status: DC
  Administered 2020-07-05: 90 mg via ORAL
  Filled 2020-07-05 (×3): qty 1

## 2020-07-05 MED ORDER — MIRABEGRON ER 50 MG PO TB24
50.0000 mg | ORAL_TABLET | Freq: Every day | ORAL | Status: DC
  Administered 2020-07-05 – 2020-07-08 (×4): 50 mg via ORAL
  Filled 2020-07-05 (×5): qty 1

## 2020-07-05 MED ORDER — CARBAMAZEPINE 200 MG PO TABS
400.0000 mg | ORAL_TABLET | Freq: Three times a day (TID) | ORAL | Status: DC
  Administered 2020-07-05 – 2020-07-09 (×11): 400 mg via ORAL
  Filled 2020-07-05 (×13): qty 2

## 2020-07-05 MED ORDER — KETAMINE HCL 10 MG/ML IJ SOLN
INTRAMUSCULAR | Status: DC | PRN
  Administered 2020-07-05 (×3): 10 mg via INTRAVENOUS
  Administered 2020-07-05: 20 mg via INTRAVENOUS

## 2020-07-05 MED ORDER — ACETAMINOPHEN 10 MG/ML IV SOLN
INTRAVENOUS | Status: DC | PRN
  Administered 2020-07-05: 1000 mg via INTRAVENOUS

## 2020-07-05 MED ORDER — DEXMEDETOMIDINE HCL 200 MCG/2ML IV SOLN
INTRAVENOUS | Status: DC | PRN
  Administered 2020-07-05 (×2): 8 ug via INTRAVENOUS

## 2020-07-05 MED ORDER — PROPOFOL 10 MG/ML IV BOLUS
INTRAVENOUS | Status: DC | PRN
  Administered 2020-07-05: 150 mg via INTRAVENOUS

## 2020-07-05 MED ORDER — OXYCODONE HCL 5 MG PO TABS
10.0000 mg | ORAL_TABLET | ORAL | Status: DC | PRN
  Administered 2020-07-06: 10 mg via ORAL
  Filled 2020-07-05: qty 2

## 2020-07-05 MED ORDER — FENTANYL CITRATE (PF) 100 MCG/2ML IJ SOLN: 25.0000 ug | INTRAMUSCULAR | Status: DC | PRN

## 2020-07-05 MED ORDER — LORATADINE 10 MG PO TABS
10.0000 mg | ORAL_TABLET | Freq: Every day | ORAL | Status: DC
  Administered 2020-07-06 – 2020-07-09 (×3): 10 mg via ORAL
  Filled 2020-07-05 (×4): qty 1

## 2020-07-05 MED ORDER — VANCOMYCIN HCL 1000 MG IV SOLR
INTRAVENOUS | Status: DC | PRN
  Administered 2020-07-05: 1000 mg via TOPICAL

## 2020-07-05 MED ORDER — ACETAMINOPHEN 500 MG PO TABS
1000.0000 mg | ORAL_TABLET | Freq: Four times a day (QID) | ORAL | Status: AC
  Administered 2020-07-06 – 2020-07-07 (×5): 1000 mg via ORAL
  Filled 2020-07-05 (×9): qty 2

## 2020-07-05 MED ORDER — ROSUVASTATIN CALCIUM 10 MG PO TABS
20.0000 mg | ORAL_TABLET | Freq: Every day | ORAL | Status: DC
  Administered 2020-07-06 – 2020-07-09 (×4): 20 mg via ORAL
  Filled 2020-07-05 (×4): qty 2

## 2020-07-05 MED ORDER — BISACODYL 5 MG PO TBEC: 5.0000 mg | DELAYED_RELEASE_TABLET | Freq: Every day | ORAL | Status: DC | PRN

## 2020-07-05 MED ORDER — MOMETASONE FURO-FORMOTEROL FUM 200-5 MCG/ACT IN AERO
2.0000 | INHALATION_SPRAY | Freq: Two times a day (BID) | RESPIRATORY_TRACT | Status: DC
  Administered 2020-07-05 – 2020-07-09 (×7): 2 via RESPIRATORY_TRACT
  Filled 2020-07-05: qty 8.8

## 2020-07-05 MED ORDER — LACTATED RINGERS IV SOLN: INTRAVENOUS | Status: DC | PRN

## 2020-07-05 MED ORDER — METHOCARBAMOL 1000 MG/10ML IJ SOLN
500.0000 mg | Freq: Four times a day (QID) | INTRAVENOUS | Status: DC
  Administered 2020-07-06 (×2): 500 mg via INTRAVENOUS
  Filled 2020-07-05 (×3): qty 5

## 2020-07-05 MED ORDER — CHLORHEXIDINE GLUCONATE 0.12 % MT SOLN
OROMUCOSAL | Status: AC
  Filled 2020-07-05: qty 15

## 2020-07-05 MED ORDER — DEXAMETHASONE SODIUM PHOSPHATE 10 MG/ML IJ SOLN
INTRAMUSCULAR | Status: AC
  Filled 2020-07-05: qty 1

## 2020-07-05 MED ORDER — MIDAZOLAM HCL 2 MG/2ML IJ SOLN
INTRAMUSCULAR | Status: AC
  Filled 2020-07-05: qty 2

## 2020-07-05 MED ORDER — PHENYLEPHRINE HCL (PRESSORS) 10 MG/ML IV SOLN
INTRAVENOUS | Status: DC | PRN
  Administered 2020-07-05 (×2): 100 ug via INTRAVENOUS
  Administered 2020-07-05: 50 ug via INTRAVENOUS
  Administered 2020-07-05 (×3): 100 ug via INTRAVENOUS

## 2020-07-05 MED ORDER — BUPIVACAINE-EPINEPHRINE 0.5% -1:200000 IJ SOLN
INTRAMUSCULAR | Status: DC | PRN
  Administered 2020-07-05: 10 mL

## 2020-07-05 MED ORDER — FENTANYL CITRATE (PF) 100 MCG/2ML IJ SOLN
INTRAMUSCULAR | Status: AC
  Filled 2020-07-05: qty 2

## 2020-07-05 MED ORDER — POLYETHYLENE GLYCOL 3350 17 G PO PACK
17.0000 g | PACK | Freq: Every day | ORAL | Status: DC | PRN
  Administered 2020-07-07: 17 g via ORAL
  Filled 2020-07-05: qty 1

## 2020-07-05 MED ORDER — ORAL CARE MOUTH RINSE: 15.0000 mL | Freq: Once | OROMUCOSAL | Status: AC

## 2020-07-05 MED ORDER — SODIUM CHLORIDE 0.9 % IV SOLN
INTRAVENOUS | Status: DC | PRN
  Administered 2020-07-05: 40 mL

## 2020-07-05 MED ORDER — LIDOCAINE HCL (PF) 2 % IJ SOLN
INTRAMUSCULAR | Status: AC
  Filled 2020-07-05: qty 5

## 2020-07-05 MED ORDER — LIDOCAINE HCL (CARDIAC) PF 100 MG/5ML IV SOSY
PREFILLED_SYRINGE | INTRAVENOUS | Status: DC | PRN
  Administered 2020-07-05: 50 mg via INTRAVENOUS

## 2020-07-05 MED ORDER — PROPRANOLOL HCL 20 MG PO TABS
10.0000 mg | ORAL_TABLET | Freq: Two times a day (BID) | ORAL | Status: DC
  Administered 2020-07-05 – 2020-07-09 (×6): 10 mg via ORAL
  Filled 2020-07-05 (×8): qty 1

## 2020-07-05 SURGICAL SUPPLY — 83 items
BUR NEURO DRILL SOFT 3.0X3.8M (BURR) ×2 IMPLANT
CANISTER SUCT 1200ML W/VALVE (MISCELLANEOUS) IMPLANT
CHLORAPREP W/TINT 26 (MISCELLANEOUS) ×2 IMPLANT
CONFIDENCE SPINAL CEMENT SYSTEM ×2 IMPLANT
CORD BIP STRL DISP 12FT (MISCELLANEOUS) ×2 IMPLANT
COUNTER NEEDLE 20/40 LG (NEEDLE) ×4 IMPLANT
COVER BACK TABLE REUSABLE LG (DRAPES) ×2 IMPLANT
COVER WAND RF STERILE (DRAPES) ×2 IMPLANT
CUP MEDICINE 2OZ PLAST GRAD ST (MISCELLANEOUS) ×2 IMPLANT
DERMABOND ADVANCED (GAUZE/BANDAGES/DRESSINGS) ×1
DERMABOND ADVANCED .7 DNX12 (GAUZE/BANDAGES/DRESSINGS) ×1 IMPLANT
DRAPE C ARM PK CFD 31 SPINE (DRAPES) ×2 IMPLANT
DRAPE C-ARMOR (DRAPES) ×2 IMPLANT
DRAPE INCISE IOBAN 66X45 STRL (DRAPES) ×4 IMPLANT
DRAPE LAPAROTOMY 100X77 ABD (DRAPES) ×4 IMPLANT
DRAPE MICROSCOPE SPINE 48X150 (DRAPES) ×2 IMPLANT
DRAPE SURG 17X11 SM STRL (DRAPES) ×10 IMPLANT
DRESSING PREVENA PLUS CUSTOM (GAUZE/BANDAGES/DRESSINGS) ×1 IMPLANT
DRSG OPSITE POSTOP 4X6 (GAUZE/BANDAGES/DRESSINGS) IMPLANT
DRSG PREVENA PLUS CUSTOM (GAUZE/BANDAGES/DRESSINGS) ×2
DRSG TEGADERM 2-3/8X2-3/4 SM (GAUZE/BANDAGES/DRESSINGS) ×4 IMPLANT
DRSG TEGADERM 4X4.75 (GAUZE/BANDAGES/DRESSINGS) IMPLANT
DRSG TEGADERM 6X8 (GAUZE/BANDAGES/DRESSINGS) IMPLANT
DRSG TELFA 3X8 NADH (GAUZE/BANDAGES/DRESSINGS) IMPLANT
DRSG TELFA 4X3 1S NADH ST (GAUZE/BANDAGES/DRESSINGS) IMPLANT
ELECT CAUTERY BLADE TIP 2.5 (TIP) ×4
ELECT EZSTD 165MM 6.5IN (MISCELLANEOUS) ×2
ELECT REM PT RETURN 9FT ADLT (ELECTROSURGICAL) ×4
ELECTRODE CAUTERY BLDE TIP 2.5 (TIP) ×2 IMPLANT
ELECTRODE EZSTD 165MM 6.5IN (MISCELLANEOUS) ×1 IMPLANT
ELECTRODE REM PT RTRN 9FT ADLT (ELECTROSURGICAL) ×2 IMPLANT
EXPEDIUMSPINE SYSTEM FENESTRATED SCREW OPEN CANNULA ×2 IMPLANT
FEE INTRAOP MONITOR IMPULS NCS (MISCELLANEOUS) IMPLANT
GAUZE 4X4 16PLY RFD (DISPOSABLE) ×2 IMPLANT
GLOVE BIOGEL PI IND STRL 7.0 (GLOVE) ×2 IMPLANT
GLOVE BIOGEL PI INDICATOR 7.0 (GLOVE) ×2
GLOVE SURG SYN 7.0 (GLOVE) ×8 IMPLANT
GLOVE SURG SYN 8.5  E (GLOVE) ×12
GLOVE SURG SYN 8.5 E (GLOVE) ×6 IMPLANT
GOWN SRG XL LVL 3 NONREINFORCE (GOWNS) ×2 IMPLANT
GOWN STRL NON-REIN TWL XL LVL3 (GOWNS) ×4
GOWN STRL REUS W/ TWL XL LVL3 (GOWN DISPOSABLE) ×1 IMPLANT
GOWN STRL REUS W/TWL XL LVL3 (GOWN DISPOSABLE) ×2
GRADUATE 1200CC STRL 31836 (MISCELLANEOUS) IMPLANT
GRAFT FIBER SELECT OSTEOAMP 15 (Tissue) ×2 IMPLANT
HEMOVAC 400CC 10FR (MISCELLANEOUS) ×4 IMPLANT
INTRAOP MONITOR FEE IMPULS NCS (MISCELLANEOUS)
INTRAOP MONITOR FEE IMPULSE (MISCELLANEOUS)
KIT ASP BONE MRW 120CC (KITS) ×2 IMPLANT
KIT SPINAL PRONEVIEW (KITS) ×2 IMPLANT
KIT TURNOVER KIT A (KITS) ×2 IMPLANT
KNIFE BAYONET SHORT DISCETOMY (MISCELLANEOUS) IMPLANT
MANIFOLD NEPTUNE II (INSTRUMENTS) ×2 IMPLANT
MARKER SKIN DUAL TIP RULER LAB (MISCELLANEOUS) ×4 IMPLANT
NDL SAFETY ECLIPSE 18X1.5 (NEEDLE) ×1 IMPLANT
NEEDLE HYPO 18GX1.5 SHARP (NEEDLE) ×2
NEEDLE HYPO 22GX1.5 SAFETY (NEEDLE) ×2 IMPLANT
PACK LAMINECTOMY NEURO (CUSTOM PROCEDURE TRAY) ×2 IMPLANT
PAD ARMBOARD 7.5X6 YLW CONV (MISCELLANEOUS) ×6 IMPLANT
PENCIL ELECTRO HAND CTR (MISCELLANEOUS) ×2 IMPLANT
PUTTY DBX 10CC (Bone Implant) ×4 IMPLANT
ROD EXPEDIUM 480MM (Rod) ×2 IMPLANT
SCREW CORT FIX FEN 5.5X7X55MM (Screw) ×6 IMPLANT
SCREW SET SINGLE INNER (Screw) ×24 IMPLANT
SCREW VIPER 7X45MM (Screw) ×2 IMPLANT
SCREW VIPER 7X50MM (Screw) ×4 IMPLANT
SPOGE SURGIFLO 8M (HEMOSTASIS) ×2
SPONGE GAUZE 2X2 8PLY STRL LF (GAUZE/BANDAGES/DRESSINGS) IMPLANT
SPONGE SURGIFLO 8M (HEMOSTASIS) ×1 IMPLANT
STAPLER SKIN PROX 35W (STAPLE) IMPLANT
SUT DVC VLOC 3-0 CL 6 P-12 (SUTURE) ×2 IMPLANT
SUT ETHILON 3-0 FS-10 30 BLK (SUTURE) ×4
SUT VIC AB 0 CT1 27 (SUTURE) ×8
SUT VIC AB 0 CT1 27XCR 8 STRN (SUTURE) ×4 IMPLANT
SUT VIC AB 2-0 CT1 18 (SUTURE) ×8 IMPLANT
SUT VIC AB 3-0 SH 8-18 (SUTURE) ×4 IMPLANT
SUTURE EHLN 3-0 FS-10 30 BLK (SUTURE) ×2 IMPLANT
SYR 30ML LL (SYRINGE) ×4 IMPLANT
SYS CONFIDENCE SPINAL CEMENT (Cement) ×2 IMPLANT
SYSTEM CONFIDENCE SPINAL CEMNT (Cement) ×1 IMPLANT
TOWEL OR 17X26 4PK STRL BLUE (TOWEL DISPOSABLE) ×6 IMPLANT
TRAY FOLEY MTR SLVR 16FR STAT (SET/KITS/TRAYS/PACK) IMPLANT
TUBING CONNECTING 10 (TUBING) ×4 IMPLANT

## 2020-07-05 NOTE — Op Note (Signed)
Indications: Ms. 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   Preoperative Note:   Risks of surgery discussed include: infection, bleeding, stroke, coma, death, paralysis, CSF leak, nerve/spinal cord injury, numbness, tingling, weakness, complex regional pain syndrome, recurrent stenosis and/or disc herniation, vascular injury, development of instability, neck/back pain, need for further surgery, persistent symptoms, development of deformity, and the risks of anesthesia. The patient understood these risks and agreed to proceed.  Operative Note:  1. Posterior spinal fusion T11-L4 2. Posterolateral arthrodesis T11 to L4 3. Posterior segmental instrumentation T11 to L4 using Depuy Expedium 4. Bone Marrow aspirate   The patient was brought to the Operating Room, intubated and turned into the prone position. All pressure points were checked and double checked. Flouroscopy was used to mark the incision. The patient was prepped and draped in the standard fashion. A full timeout was performed. Preoperative antibiotics were given. The incision was injected with local anesthetic.  The incision was opened with a scalpel, then the soft tissues divided with the Bovie. Self-retaining retractors were placed. The paraspinus muscles were reflected laterally in subperiosteal fashion until the transverse processes were visible. The prior instrumentation was visualized and confirmed.    The left iliac crest was located, then cannulated via a separate stab incision.  120 ml of bone marrow was aspirated  and handed off for preparation as autograft.  The prior implants were located and removed.  The tracts were felt.  The prior scar tissue was removed from T11 to L3 to expose the posterior elements.  There was a clear pseudoarthrosis.  7x45 mm Depuy expedium screws were placed at T11-L2, then 7x55 at L3, and 7x55 at L4 via a new tract.  We then confirmed screws on xray.  We used cement augmentation for the T11, T12, L3, and L4 screws.  After this was done, we moved to placement of rods.    Rods were measured to length, cut, and shaped. The rods were secured using locking caps to manufacturer's specifications. Final AP and lateral radiographs were taken to confirm placement of instrumentation and appropriate alignment. The wound was copiously irrigated, then the external surfaces of the remaining lamina, facet, and transverse processes from T11 to L4 were decorticated. A mixture of allograft and autograft was placed over the decorticated surfaces for arthrodesis.  2 drains were placed subfascially.   After hemostasis, the wound was closed in layers with 0 and 2-0 vicryl. 3-0 vicryl was used on the dermis.  An incisional wound vac was placed.   The patient was then flipped supine and extubated with incident. All counts were correct times 2 at the end of the case. No immediate complications were noted.  Lonell Face NP assisted in the entire procedure.  Meade Maw MD

## 2020-07-05 NOTE — Anesthesia Postprocedure Evaluation (Signed)
Anesthesia Post Note  Patient: [name]  Procedure(s) Performed: T11-L4 POSTERIOR SPINAL FUSION (N/A ) BONE MARROW ASPIRATION FOR SPINE FUSION ONLY (BMAC) (N/A )  Patient location during evaluation: PACU Anesthesia Type: General Level of consciousness: awake and alert Pain management: pain level controlled Vital Signs Assessment: post-procedure vital signs reviewed and stable Respiratory status: spontaneous breathing, nonlabored ventilation, respiratory function stable and patient connected to nasal cannula oxygen Cardiovascular status: blood pressure returned to baseline and stable Postop Assessment: no apparent nausea or vomiting Anesthetic complications: no   No complications documented.   Last Vitals:  Vitals:   07/05/20 1857 07/05/20 1934  BP: 114/78 123/64  Pulse: 86 78  Resp: 18 16  Temp: (!) 36.4 C 36.7 C  SpO2: 94% 100%    Last Pain:  Vitals:   07/05/20 1934  TempSrc: Oral  PainSc:                  Victoria Austin

## 2020-07-05 NOTE — Anesthesia Preprocedure Evaluation (Addendum)
Anesthesia Evaluation   Patient awake    Reviewed: Allergy & Precautions, NPO status , Patient's Chart, lab work & pertinent test results, reviewed documented beta blocker date and time   History of Anesthesia Complications Negative for: history of anesthetic complications  Airway Mallampati: III  TM Distance: <3 FB     Dental  (+) Dental Advidsory Given, Poor Dentition, Missing   Pulmonary neg shortness of breath, sleep apnea , neg recent URI,    Pulmonary exam normal        Cardiovascular hypertension, Pt. on medications (-) angina(-) Past MI Normal cardiovascular exam(-) dysrhythmias (-) Valvular Problems/Murmurs     Neuro/Psych neg Seizures PSYCHIATRIC DISORDERS Anxiety Depression Bipolar Disorder  Neuromuscular disease (fibromyalgia) CVA    GI/Hepatic Neg liver ROS, hiatal hernia, GERD  ,  Endo/Other  diabetes  Renal/GU Renal disease Bladder dysfunction      Musculoskeletal  (+) Arthritis , Rheumatoid disorders,  Fibromyalgia -  Abdominal Normal abdominal exam  (+)   Peds negative pediatric ROS (+)  Hematology  (+) Blood dyscrasia, anemia ,   Anesthesia Other Findings Past Medical History: No date: Anemia     Comment:  vitamin d deficiency No date: Anxiety     Comment:  ER visit on 10/15/19 for panic attack, sob, shaking No date: Arthritis     Comment:  per patient she does not have RA, has osteoarthritis 2006: Bipolar 1 disorder ([place]) No date: Chronic kidney disease     Comment:  stage III No date: Depression No date: Diabetes mellitus without complication (HCC)     Comment:  on insulin. No date: Fibromyalgia No date: GERD (gastroesophageal reflux disease) No date: Hereditary essential tremor No date: History of hiatal hernia 2014: Hodgkin's lymphoma ([place])     Comment:  SKIN CANCER ALSO No date: Hyperlipidemia No date: Hypertension No date: IBS (irritable bowel syndrome) No date: IBS (irritable  bowel syndrome) No date: Incontinence No date: Neuropathy No date: Osteopenia No date: RA (rheumatoid arthritis) ([place]) No date: Scoliosis No date: Sleep apnea     Comment:  uses cpap, per patient does not use cpap at all. 2019: Stroke [facility])     Comment:  several tia's prior to stroke.  on plavix No date: Temporal arteritis (HCC)   Reproductive/Obstetrics negative OB ROS                            Anesthesia Physical  Anesthesia Plan  ASA: III  Anesthesia Plan: General   Post-op Pain Management:    Induction: Intravenous  PONV Risk Score and Plan: 3 and Dexamethasone, Treatment may vary due to age or medical condition and Midazolam  Airway Management Planned: Oral ETT  Additional Equipment:   Intra-op Plan:   Post-operative Plan: Extubation in OR  Informed Consent: I have reviewed the patients History and Physical, chart, labs and discussed the procedure including the risks, benefits and alternatives for the proposed anesthesia with the patient or authorized representative who has indicated his/her understanding and acceptance.     Dental advisory given  Plan Discussed with: CRNA and Surgeon  Anesthesia Plan Comments:        Anesthesia Quick Evaluation

## 2020-07-05 NOTE — Progress Notes (Signed)
Report to [employer]

## 2020-07-05 NOTE — Consult Note (Signed)
Consult Note   PLEASE NOTE THAT DRAGON DICTATION SOFTWARE WAS USED IN THE CONSTRUCTION OF THIS NOTE.   Victoria Austin AQT:[id number] DOB: 1951/02/22 DOA: 07/05/2020  PCP: Donnamarie Rossetti, PA-C Patient coming from: home   I have personally briefly reviewed patient's old medical records in [facility]  Reason for Consult: Assistance with postoperative management of multiple chronic medical problems, including type 2 diabetes mellitus. Consulting physician: Dr. Izora Ribas of primary neurosurgery service   HPI: Victoria Austin is a 69 y.o. female with medical history significant for lumbar radiculopathy status post lumbar fusion in April 2021, type 2 diabetes mellitus, with most recent hemoglobin A1c noted to be 6.9% when checked in April 2021, hypertension, hyperlipidemia, obstructive sleep apnea noncompliant with home CPAP, who is admitted to [facility] on 07/05/2020 neurosurgery service for elective hardware removal relating to prior lumbar fusion surgery.   In the setting of history of lumbar radiculopathy the patient underwent underwent T11-L3 posterior fusion in April 2021.  Today, the patient has been admitted for elective associated hardware removal, which was performed earlier today in the setting of intraoperative overt complications.  Postoperative consulted the hospitalist service management of the patient's multiple chronic medical problems, which include type 2 diabetes mellitus, hypertension, hyperlipidemia.  The patient currently reports pain control.  She denies any recent chest pain, shortness of breath, palpitations, or diaphoresis.  Denies any recent nausea/vomiting.   While her home medication list currently basal insulin in the form of Lantus 80 units subcu nightly, the patient reports that she actually takes 50 units of Lantus on a nightly basis.  This is in addition to sliding scale NovoLog taken 3 times daily with meals.  Per chart review, it  appears that most recent hemoglobin A1c was checked in April 2021, and found to be 6.9% at that time.  Her diabetes is complicated neuropathy, for which she takes gabapentin at home.  In addition to this insulin regimen, she is also on empagliflozin at home.  In the setting of a history of essential hypertension, outpatient antihypertensive regimen includes lisinopril as well as Norvasc.  She knowledges a history of hyperlipidemia, for which she is on gemfibrozil at home, and reports that she is not currently on as an outpatient.  She knowledges a history of obstructive sleep apnea, but reports poor compliance with use of nocturnal CPAP as an outpatient.     Review of Systems: As per HPI otherwise 10 point review of systems negative.   Past Medical History:  Diagnosis Date  . Anemia    vitamin d deficiency  . Anxiety    ER visit on 10/15/19 for panic attack, sob, shaking  . Arthritis    per patient she does not have RA, has osteoarthritis  . Bipolar 1 disorder ([place]) 2006  . Chronic kidney disease    stage III  . Depression   . Diabetes mellitus without complication ([place])    on insulin.  . Fibromyalgia   . GERD (gastroesophageal reflux disease)   . Hereditary essential tremor   . History of hiatal hernia   . Hodgkin's lymphoma ([place]) 2014   SKIN CANCER ALSO  . Hyperlipidemia   . Hypertension   . IBS (irritable bowel syndrome)   . IBS (irritable bowel syndrome)   . Incontinence   . Neuropathy   . Osteopenia   . RA (rheumatoid arthritis) ([place])   . Scoliosis   . Sleep apnea    uses cpap, per patient does not use  cpap at all.  . Stroke ([place]) 2019   several tia's prior to stroke.  on plavix  . Temporal arteritis [facility])     Past Surgical History:  Procedure Laterality Date  . BACK SURGERY     10/2019, 10/2018  . CESAREAN SECTION    . ELBOW ARTHROSCOPY    . FINGER SURGERY    . HAND SURGERY    . HERNIA REPAIR     UMBILICAL REPAIR  . LAMINECTOMY WITH POSTERIOR LATERAL  ARTHRODESIS LEVEL 4 N/A 11/03/2019   Procedure: T11-L3 POSTERIOR FUSION, T12-L2 POSTERIOR COLUMN OSTEOTOMIES;  Surgeon: Meade Maw, MD;  Location: [facility] ORS;  Service: Neurosurgery;  Laterality: N/A;  . SPINE SURGERY      Social History:  reports that she has never smoked. She has never used smokeless tobacco. She reports that she does not drink alcohol and does not use drugs.   Allergies  Allergen Reactions  . Bee Venom Anaphylaxis  . Fire [employer] Anaphylaxis  . Other Anaphylaxis    Green peppers  . Ciprofloxacin Nausea And Vomiting  . Cyclobenzaprine Nausea And Vomiting  . Diazepam Nausea And Vomiting  . Ketorolac     HALLUCINATIONS  . Ondansetron Nausea And Vomiting    ABRUPT VOMITTING  . Ranitidine Hcl     FEELS LIKE BUGS CRAWLING ALL OVER HER  . Ambien [Zolpidem] Other (See Comments)    Severely sedating  . Bacitracin-Polymyxin B     BURNS SKIN  . Mushroom Extract Complex Nausea And Vomiting  . Neomycin-Polymyxin-Gramicidin Rash    BURNS SKIN    Family History  Problem Relation Age of Onset  . Lung cancer Mother   . Hypertension Mother   . Hypertension Sister   . Hypertension Brother   . Bone cancer Maternal Aunt   . Colon cancer Maternal Uncle       Prior to Admission medications   Medication Sig Start Date End Date Taking? Authorizing Provider  acetaminophen (TYLENOL) 500 MG tablet Take 1,000 mg by mouth every 6 (six) hours as needed for headache.   Yes [provider]  albuterol (PROAIR HFA) 108 (90 Base) MCG/ACT inhaler Inhale 1 puff into the lungs 2 (two) times daily as needed. Patient taking differently: Inhale 1 puff into the lungs 2 (two) times daily.  09/21/19  Yes Delsa Grana, PA-C  alendronate (FOSAMAX) 70 MG tablet Take 70 mg by mouth every Monday.  08/25/19 08/24/20 Yes [provider]  amLODipine (NORVASC) 10 MG tablet Take 1 tablet (10 mg total) by mouth daily. 05/08/20 08/06/20 Yes Agbor-Etang, Aaron Edelman, MD  bisacodyl (DULCOLAX) 5  MG EC tablet Take 5 mg by mouth daily as needed for moderate constipation.    Yes [provider]  carbamazepine (TEGRETOL) 200 MG tablet Take 400 mg by mouth 3 (three) times daily. Brianne Klinger,OIC  04/27/18  Yes   clopidogrel (PLAVIX) 75 MG tablet Take 1 tablet (75 mg total) by mouth daily. 01/19/20  Yes Sowles, Drue Stager, MD  Cyanocobalamin (B-12 COMPLIANCE INJECTION) 1000 MCG/ML KIT Inject 1,000 mcg as directed every 30 (thirty) days.   Yes [provider]  docusate sodium (COLACE) 100 MG capsule Take 100 mg by mouth every other day.    Yes [provider]  empagliflozin (JARDIANCE) 25 MG TABS tablet Take 25 mg by mouth daily.    Yes [provider]  EPINEPHrine (EPIPEN 2-PAK) 0.3 mg/0.3 mL IJ SOAJ injection Inject 0.3 mLs (0.3 mg total) into the muscle as needed for anaphylaxis. Then call  911 for ER transport. 09/21/19  Yes Delsa Grana, PA-C  ferrous sulfate 325 (65 FE) MG tablet Take 325 mg by mouth daily.    Yes [provider]  fluticasone-salmeterol (ADVAIR HFA) 230-21 MCG/ACT inhaler Inhale 2 puffs into the lungs 2 (two) times daily.   Yes [provider]  gabapentin (NEURONTIN) 300 MG capsule Take 1 capsule (300 mg total) by mouth at bedtime. 12/29/19  Yes Sowles, Drue Stager, MD  gemfibrozil (LOPID) 600 MG tablet Take 600 mg by mouth 2 (two) times daily. 05/08/20  Yes [provider]  insulin glargine (LANTUS) 100 UNIT/ML Solostar Pen Inject 80-90 Units into the skin at bedtime.    Yes [provider]  lisinopril (ZESTRIL) 20 MG tablet Take 20 mg by mouth daily.  05/08/20  Yes [provider]  loratadine (CLARITIN) 10 MG tablet Take 1 tablet (10 mg total) by mouth daily. Patient taking differently: Take 10 mg by mouth daily as needed for allergies.  01/01/19  Yes Hubbard Hartshorn, FNP  LORazepam (ATIVAN) 1 MG tablet Take 1 mg by mouth See admin instructions. Take 1 mg at night, may take a second 1 mg dose during the day  as needed for anxiety 06/15/18  Yes [provider]  meclizine (ANTIVERT) 25 MG tablet Take 1 tablet (25 mg total) by mouth daily as needed for dizziness. At most once daily , must last 90 days Patient taking differently: Take 25 mg by mouth 3 (three) times daily.  03/31/20  Yes Sowles, Drue Stager, MD  methocarbamol (ROBAXIN) 500 MG tablet Take 2 tablets (1,000 mg total) by mouth every 6 (six) hours. Patient taking differently: Take 500 mg by mouth at bedtime.  11/08/19  Yes Marin Olp, PA-C  mirabegron ER (MYRBETRIQ) 50 MG TB24 tablet Take 1 tablet (50 mg total) by mouth daily. Patient taking differently: Take 50 mg by mouth at bedtime.  04/10/20  Yes Billey Co, MD  montelukast (SINGULAIR) 10 MG tablet Take 10 mg by mouth daily as needed (allergies).   Yes [provider]  NOVOLOG FLEXPEN 100 UNIT/ML FlexPen Inject 22-34 Units into the skin 3 (three) times daily. With meals Patient taking differently: Inject 14-22 Units into the skin 3 (three) times daily with meals.  10/08/19  Yes Delsa Grana, PA-C  oxyCODONE-acetaminophen (PERCOCET/ROXICET) 5-325 MG tablet Take 1 tablet by mouth every 6 (six) hours as needed. Patient taking differently: Take 1 tablet by mouth every 6 (six) hours as needed for moderate pain.  03/11/20  Yes Cuthriell, Charline Bills, PA-C  pantoprazole (PROTONIX) 40 MG tablet Take 1 tablet (40 mg total) by mouth daily. 08/05/19  Yes Hubbard Hartshorn, FNP  primidone (MYSOLINE) 250 MG tablet Take 250 mg by mouth at bedtime.  04/28/20 04/28/21 Yes [provider]  promethazine (PHENERGAN) 25 MG tablet Take 1 tablet (25 mg total) by mouth every 6 (six) hours as needed. Patient taking differently: Take 25 mg by mouth every 6 (six) hours as needed for nausea or vomiting.  09/21/19  Yes Delsa Grana, PA-C  propranolol (INDERAL) 10 MG tablet Take 1 tablet (10 mg total) by mouth 2 (two) times daily. Patient taking differently: Take 10 mg by mouth daily.  08/05/19  Yes Hubbard Hartshorn, FNP  rosuvastatin (CRESTOR) 20 MG tablet Take 1 tablet (20 mg total) by mouth daily. 04/06/20  Yes Sowles, Drue Stager, MD  Simethicone 125 MG CAPS Take 125 mg by mouth daily as needed (gas).    Yes [provider]  trimethoprim (TRIMPEX) 100 MG tablet Take 1 tablet (100 mg total) by mouth daily. Patient taking differently: Take 100 mg by mouth at bedtime.  04/10/20  Yes Billey Co, MD  orphenadrine (NORFLEX) 100 MG tablet Take 1 tablet (100 mg total) by mouth 2 (two) times daily as needed for muscle spasms. 06/24/20   Menshew, Dannielle Karvonen, PA-C     Objective    Physical Exam: Vitals:   07/05/20 1200 07/05/20 1743 07/05/20 1751 07/05/20 1800  BP: 134/73 (!) 105/91  135/82  Pulse: 95  87 84  Resp: 16  12 17   Temp: 98.4 F (36.9 C) (!) 97.2 F (36.2 C)    TempSrc: Tympanic     SpO2: 99%  97%   Weight: 82.3 kg     Height: 5' 3"  (1.6 m)       General: appears to be stated age; alert, oriented Skin: warm, dry, no rash Head:  AT/[place] Mouth:  Oral mucosa membranes appear moist, normal dentition Neck: supple; trachea midline Heart:  RRR; did not appreciate any M/R/G Lungs: CTAB, did not appreciate any wheezes, rales, or rhonchi Abdomen: + BS; soft, ND, NT Vascular: 2+ pedal pulses b/l; 2+ radial pulses b/l Extremities: no peripheral edema, no muscle wasting Neuro: strength and sensation intact in upper and lower extremities b/l    Labs on Admission: I have personally reviewed following labs and imaging studies  CBC: Recent Labs  Lab 07/05/20 1212 07/05/20 1625  WBC  --  12.6*  HGB 11.6* 9.8*  HCT 34.0* 30.5*  MCV  --  93.6  PLT  --  045   Basic Metabolic Panel: Recent Labs  Lab 07/05/20 1212  NA 142  K 4.9  CL 106  GLUCOSE 166*  BUN 31*  CREATININE 1.00   GFR: Estimated Creatinine Clearance: 54.7 mL/min (by C-G formula based on SCr of 1 mg/dL). Liver Function Tests: No results for input(s): AST, ALT, ALKPHOS, BILITOT, PROT, ALBUMIN in the  last 168 hours. No results for input(s): LIPASE, AMYLASE in the last 168 hours. No results for input(s): AMMONIA in the last 168 hours. Coagulation Profile: No results for input(s): INR, PROTIME in the last 168 hours. Cardiac Enzymes: No results for input(s): CKTOTAL, CKMB, CKMBINDEX, TROPONINI in the last 168 hours. BNP (last 3 results) No results for input(s): PROBNP in the last 8760 hours. HbA1C: No results for input(s): HGBA1C in the last 72 hours. CBG: Recent Labs  Lab 07/05/20 1157 07/05/20 1744  GLUCAP 168* 199*   Lipid Profile: No results for input(s): CHOL, HDL, LDLCALC, TRIG, CHOLHDL, LDLDIRECT in the last 72 hours. Thyroid Function Tests: No results for input(s): TSH, T4TOTAL, FREET4, T3FREE, THYROIDAB in the last 72 hours. Anemia Panel: No results for input(s): VITAMINB12, FOLATE, FERRITIN, TIBC, IRON, RETICCTPCT in the last 72 hours. Urine analysis:    Component Value Date/Time   COLORURINE YELLOW (A) 06/28/2020 1020   APPEARANCEUR CLOUDY (A) 06/28/2020 1020   APPEARANCEUR Clear 01/28/2019 1355   LABSPEC 1.015 06/28/2020 1020   PHURINE 5.0 06/28/2020 1020   GLUCOSEU >=500 (A) 06/28/2020 1020   HGBUR NEGATIVE 06/28/2020 1020   BILIRUBINUR NEGATIVE 06/28/2020 1020   BILIRUBINUR negative 02/04/2020 1455   BILIRUBINUR Negative 01/28/2019 1355   KETONESUR NEGATIVE 06/28/2020 1020   PROTEINUR 100 (A) 06/28/2020 1020   UROBILINOGEN 0.2 02/04/2020 1455   NITRITE NEGATIVE 06/28/2020 1020   LEUKOCYTESUR LARGE (A) 06/28/2020 1020    Radiological Exams on Admission: DG Lumbar Spine 2-3 Views  Result  Date: 07/05/2020 CLINICAL DATA:  Surgery. EXAM: LUMBAR SPINE - 2-3 VIEW; DG C-ARM 1-60 MIN COMPARISON:  Radiographs November 25 21. FINDINGS: Fluoro time: 6 seconds. Seven C-arm fluoroscopic images were obtained intraoperatively and submitted for post operative interpretation. These images demonstrate posterior rod and screw fixation spanning T11 to L4. Please see the  performing provider's procedural report for further detail. IMPRESSION: Intraoperative fluoroscopic images, as detailed above Electronically Signed   By: Margaretha Sheffield MD   On: 07/05/2020 16:44   DG C-Arm 1-60 Min  Result Date: 07/05/2020 CLINICAL DATA:  Surgery. EXAM: LUMBAR SPINE - 2-3 VIEW; DG C-ARM 1-60 MIN COMPARISON:  Radiographs November 25 21. FINDINGS: Fluoro time: 6 seconds. Seven C-arm fluoroscopic images were obtained intraoperatively and submitted for post operative interpretation. These images demonstrate posterior rod and screw fixation spanning T11 to L4. Please see the performing provider's procedural report for further detail. IMPRESSION: Intraoperative fluoroscopic images, as detailed above Electronically Signed   By: Margaretha Sheffield MD   On: 07/05/2020 16:44      Assessment/Plan   Victoria Austin is a 69 y.o. female with medical history significant for lumbar radiculopathy status post lumbar fusion in April 2021, type 2 diabetes mellitus, with most recent hemoglobin A1c noted to be 6.9% when checked in April 2021, hypertension, hyperlipidemia, obstructive sleep apnea noncompliant with home CPAP, who is admitted to [facility] on 07/05/2020 neurosurgery service for elective hardware removal relating to prior lumbar fusion surgery.     Principal Problem:   DM type 2 with diabetic peripheral neuropathy ([place]) Active Problems:   Dyslipidemia associated with type 2 diabetes mellitus ([place])   Diabetic neuropathy ([facility])   Benign essential HTN   Lumbar radiculopathy   #) Type 2 diabetes mellitus: Complicated by polyperipheral neuropathy for which she is on gabapentin.  Outpatient insulin regimen, per the patient, consist of Lantus 50 units subcu nightly as well as sliding scale NovoLog 3 times daily with meals.  This is all in the context of most recent hemoglobin A1c noted to be 6.9% in April 2021.  She is also on Empagliflozin as apparent sole outpatient  hypoglycemic agent.  Plan: Recommend conservative approach to initial dosing of basal insulin, recommend Lantus 25 units subcu nightly.  Accu-Cheks before every meal and at bedtime with low to moderate dose sliding scale insulin.  Hold home Empagliflozin during this hospitalization.  Continue home gabapentin.     #) Essential hypertension: On lisinopril as well as Norvasc at home.  Most recent normotensive.  Plan: Resume home antihypertensive regimen, with close ensuing monitoring of blood pressure via routine vital signs.     #) Hyperlipidemia: On gemfibrozil in the absence of any statin medication.  Plan: Continue gemfibrozil.    #) Obstructive sleep apnea: Poorly compliant with nocturnal CPAP as an outpatient.  Counseled the patient on the importance of improved compliance with home nocturnal CPAP.     #) Lumbar radiculopathy: Status post T11-L3 posterior fusion in April 2021, with ensuing associated hardware removal earlier today on an elective basis.  Patient currently reports adequate pain control.  Plan: Additional postoperative monitoring per primary neurosurgery service.      Of note, this patient was added by me to the following Admit List/Treatment Team:  armcadmits     PLEASE NOTE THAT DRAGON DICTATION SOFTWARE WAS USED IN THE CONSTRUCTION OF THIS NOTE.   [facility] Pager [phone] From 12PM- 12AM  Otherwise, please contact night-coverage  [email or web address] Password [email or web address]  07/05/2020, 6:10 PM

## 2020-07-05 NOTE — H&P (Signed)
Procedure: T11-L3 posterior fusion, T12-L2 posterior column osteotomies Date of Procedure: 11/03/2019 Diagnosis: L1 compression fracture, scoliosis  HISTORY OF PRESENT ILLNESS:  07/05/2020 Ms. Strother presents today surgical intervention. She has continued pain.  06/02/2020 Ree Shay is approximately 7 months status post T11-L3 posterior fusion, T12-L2 posterior column osteotomies.  She was doing well until early June. During that time, she had a major family issues that required her to travel back and forth to [place] many times. She lost a family member. Since June, she has noticed worsening of her back pain. She is previously having back pain at nighttime. If she stands for too long, she also gets discomfort into her legs.  She continues to have severe pain.  Family History  Problem Relation Age of Onset  . Lung cancer Mother   . Hypertension Mother   . Hypertension Sister   . Hypertension Brother   . Bone cancer Maternal Aunt   . Colon cancer Maternal Uncle     Social History   Socioeconomic History  . Marital status: Legally Separated    Spouse name: Not on file  . Number of children: 2  . Years of education: Not on file  . Highest education level: Not on file  Occupational History  . Occupation: retired  Tobacco Use  . Smoking status: Never Smoker  . Smokeless tobacco: Never Used  Vaping Use  . Vaping Use: Never used  Substance and Sexual Activity  . Alcohol use: Never  . Drug use: Never  . Sexual activity: Not Currently    Partners: Male  Other Topics Concern  . Not on file  Social History Narrative   Patient lives with daughter and 15 year old grandson. Patient states her grandson has become aggressive and has behavioral issues that she is concerned about. She also stated her granddaughter has recently been arrested for felony child abuse and she is at her breaking point.    Social Determinants of Health   Financial Resource Strain:   . Difficulty of  Paying Living Expenses: Not on file  Food Insecurity:   . Worried About Charity fundraiser in the Last Year: Not on file  . Ran Out of Food in the Last Year: Not on file  Transportation Needs:   . Lack of Transportation (Medical): Not on file  . Lack of Transportation (Non-Medical): Not on file  Physical Activity:   . Days of Exercise per Week: Not on file  . Minutes of Exercise per Session: Not on file  Stress:   . Feeling of Stress : Not on file  Social Connections:   . Frequency of Communication with Friends and Family: Not on file  . Frequency of Social Gatherings with Friends and Family: Not on file  . Attends Religious Services: Not on file  . Active Member of Clubs or Organizations: Not on file  . Attends Archivist Meetings: Not on file  . Marital Status: Not on file  Intimate Partner Violence:   . Fear of Current or Ex-Partner: Not on file  . Emotionally Abused: Not on file  . Physically Abused: Not on file  . Sexually Abused: Not on file    Allergies  Allergen Reactions  . Bee Venom Anaphylaxis  . Fire [employer] Anaphylaxis  . Other Anaphylaxis    Green peppers  . Ciprofloxacin Nausea And Vomiting  . Cyclobenzaprine Nausea And Vomiting  . Diazepam Nausea And Vomiting  . Ketorolac     HALLUCINATIONS  . Ondansetron  Nausea And Vomiting    ABRUPT VOMITTING  . Ranitidine Hcl     FEELS LIKE BUGS CRAWLING ALL OVER HER  . Ambien [Zolpidem] Other (See Comments)    Severely sedating  . Bacitracin-Polymyxin B     BURNS SKIN  . Mushroom Extract Complex Nausea And Vomiting  . Neomycin-Polymyxin-Gramicidin Rash    BURNS SKIN   Current Facility-Administered Medications for the 07/05/20 encounter [facility] Encounter)  Medication  . cyanocobalamin ((VITAMIN B-12)) injection 1,000 mcg   Current Meds  Medication Sig  . acetaminophen (TYLENOL) 500 MG tablet Take 1,000 mg by mouth every 6 (six) hours as needed for headache.  . albuterol (PROAIR HFA) 108 (90 Base)  MCG/ACT inhaler Inhale 1 puff into the lungs 2 (two) times daily as needed. (Patient taking differently: Inhale 1 puff into the lungs 2 (two) times daily. )  . alendronate (FOSAMAX) 70 MG tablet Take 70 mg by mouth every Monday.   Marland Kitchen amLODipine (NORVASC) 10 MG tablet Take 1 tablet (10 mg total) by mouth daily.  . bisacodyl (DULCOLAX) 5 MG EC tablet Take 5 mg by mouth daily as needed for moderate constipation.   . carbamazepine (TEGRETOL) 200 MG tablet Take 400 mg by mouth 3 (three) times daily. Brianne Klinger,OIC   . clopidogrel (PLAVIX) 75 MG tablet Take 1 tablet (75 mg total) by mouth daily.  . Cyanocobalamin (B-12 COMPLIANCE INJECTION) 1000 MCG/ML KIT Inject 1,000 mcg as directed every 30 (thirty) days.  Marland Kitchen docusate sodium (COLACE) 100 MG capsule Take 100 mg by mouth every other day.   . DULoxetine (CYMBALTA) 30 MG capsule Take 30 mg by mouth every morning. Take with 60 mg to equal 90 mg at bedtime  . DULoxetine (CYMBALTA) 60 MG capsule Take 60 mg by mouth every morning. Take with 30 mg to equal 90 mg at bedtime  . empagliflozin (JARDIANCE) 25 MG TABS tablet Take 25 mg by mouth daily.   Marland Kitchen EPINEPHrine (EPIPEN 2-PAK) 0.3 mg/0.3 mL IJ SOAJ injection Inject 0.3 mLs (0.3 mg total) into the muscle as needed for anaphylaxis. Then call 911 for ER transport.  . ferrous sulfate 325 (65 FE) MG tablet Take 325 mg by mouth daily.   . fluticasone-salmeterol (ADVAIR HFA) 230-21 MCG/ACT inhaler Inhale 2 puffs into the lungs 2 (two) times daily.  Marland Kitchen gabapentin (NEURONTIN) 300 MG capsule Take 1 capsule (300 mg total) by mouth at bedtime.  Marland Kitchen gemfibrozil (LOPID) 600 MG tablet Take 600 mg by mouth 2 (two) times daily.  . insulin glargine (LANTUS) 100 UNIT/ML Solostar Pen Inject 80-90 Units into the skin at bedtime.   Marland Kitchen lisinopril (ZESTRIL) 20 MG tablet Take 20 mg by mouth daily.   Marland Kitchen loratadine (CLARITIN) 10 MG tablet Take 1 tablet (10 mg total) by mouth daily. (Patient taking differently: Take 10 mg by mouth daily as  needed for allergies. )  . LORazepam (ATIVAN) 1 MG tablet Take 1 mg by mouth See admin instructions. Take 1 mg at night, may take a second 1 mg dose during the day as needed for anxiety  . meclizine (ANTIVERT) 25 MG tablet Take 1 tablet (25 mg total) by mouth daily as needed for dizziness. At most once daily , must last 90 days (Patient taking differently: Take 25 mg by mouth 3 (three) times daily. )  . methocarbamol (ROBAXIN) 500 MG tablet Take 2 tablets (1,000 mg total) by mouth every 6 (six) hours. (Patient taking differently: Take 500 mg by mouth at bedtime. )  . mirabegron  ER (MYRBETRIQ) 50 MG TB24 tablet Take 1 tablet (50 mg total) by mouth daily. (Patient taking differently: Take 50 mg by mouth at bedtime. )  . montelukast (SINGULAIR) 10 MG tablet Take 10 mg by mouth daily as needed (allergies).  . NOVOLOG FLEXPEN 100 UNIT/ML FlexPen Inject 22-34 Units into the skin 3 (three) times daily. With meals (Patient taking differently: Inject 14-22 Units into the skin 3 (three) times daily with meals. )  . oxyCODONE-acetaminophen (PERCOCET/ROXICET) 5-325 MG tablet Take 1 tablet by mouth every 6 (six) hours as needed. (Patient taking differently: Take 1 tablet by mouth every 6 (six) hours as needed for moderate pain. )  . pantoprazole (PROTONIX) 40 MG tablet Take 1 tablet (40 mg total) by mouth daily.  . primidone (MYSOLINE) 250 MG tablet Take 250 mg by mouth at bedtime.   . promethazine (PHENERGAN) 25 MG tablet Take 1 tablet (25 mg total) by mouth every 6 (six) hours as needed. (Patient taking differently: Take 25 mg by mouth every 6 (six) hours as needed for nausea or vomiting. )  . propranolol (INDERAL) 10 MG tablet Take 1 tablet (10 mg total) by mouth 2 (two) times daily. (Patient taking differently: Take 10 mg by mouth daily. )  . rosuvastatin (CRESTOR) 20 MG tablet Take 1 tablet (20 mg total) by mouth daily.  . Simethicone 125 MG CAPS Take 125 mg by mouth daily as needed (gas).   . trimethoprim  (TRIMPEX) 100 MG tablet Take 1 tablet (100 mg total) by mouth daily. (Patient taking differently: Take 100 mg by mouth at bedtime. )    PHYSICAL EXAMINATION:   Vitals:   07/05/20 1200  BP: 134/73  Pulse: 95  Resp: 16  Temp: 98.4 F (36.9 C)  SpO2: 99%    Heart sounds normal no MRG. Chest Clear to Auscultation Bilaterally.   General: Patient is well-developed, well-nourished, and in no apparent distress  Neurological: Awake, alert, oriented to person, place, and time. Pupils equal round and reactive to light. Facial tone is symmetric.   Strength: Side Iliopsoas Quads Hamstring Plantar Flexion Dorsiflexion Extensor Hallicus Longus  R 5 5 5 5 5 5   L 5 5 5 5 5 5    Sensory: intact and symmetric to light touch in lower extremities. Baseball sized area of decreased sensation noted over the right hip. Otherwise, sensation to bilateral hips lumbar spine and buttocks intact to light touch.  Skin: Incision site has healed well.   IMAGING:  CT TL spine 06/01/2020 IMPRESSION:  1. Interval T11-L3 posterior fusion with mild lucency about the L3  pedicle screws suggesting loosening. No stenosis at the  postoperative levels.  2. Unchanged mild-to-moderate neural foraminal stenosis from L3-4 to  L5-S1.  3. Upper thoracic facet arthrosis. No thoracic disc herniation or  stenosis.  4. Aortic Atherosclerosis (ICD10-I70.0).   Electronically Signed  By: Logan Bores M.D.  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 We will proceed with revision extension to L4.  Meade Maw MD, [facility] Department of Neurosurgery

## 2020-07-05 NOTE — Progress Notes (Signed)
Pt has repeated felt like cannot take deep breath.  CRNA mark has seen pt.  Have used incentive spirometry as well as change patient position. Pt has sleep apnea and is supposed to wear cpap but does not.  Also has anxiety history.  Breath sounds clear at this time, mildly diminished in bases.  [place] for floor. Called and discussed with dr piscitello.  Will continue to monitor.  2 L [place] placed on pt for comfort, pt reports this helps.

## 2020-07-05 NOTE — Transfer of Care (Signed)
Immediate Anesthesia Transfer of Care Note  Patient: Victoria Austin  Procedure(s) Performed: T11-L4 POSTERIOR SPINAL FUSION (N/A ) BONE MARROW ASPIRATION FOR SPINE FUSION ONLY (BMAC) (N/A )  Patient Location: PACU  Anesthesia Type:General  Level of Consciousness: sedated  Airway & Oxygen Therapy: Patient Spontanous Breathing and Patient connected to face mask oxygen  Post-op Assessment: Report given to RN and Post -op Vital signs reviewed and stable  Post vital signs: Reviewed and stable  Last Vitals:  Vitals Value Taken Time  BP 105/91 07/05/20 1743  Temp 36.2 C 07/05/20 1743  Pulse 88 07/05/20 1747  Resp 15 07/05/20 1747  SpO2 98 % 07/05/20 1747  Vitals shown include unvalidated device data.  Last Pain:  Vitals:   07/05/20 1743  TempSrc:   PainSc: (P) Asleep      Patients Stated Pain Goal: 3 (90/93/11 2162)  Complications: No complications documented.

## 2020-07-05 NOTE — Consult Note (Signed)
Pharmacy Antibiotic Note  Victoria Austin is a 69 y.o. female admitted with surgical prophylaxis.  Pharmacy has been consulted for Vancomycin/Cefazolin dosing.  Plan: Cefazolin 2g IV x 1 (for patients <120 kg); administer within 60 minutes of surgical incision.  May repeat dose intraoperatively in 4 hours if procedure is lengthy or if there is excessive blood loss (contact pharmacy if needed)  Vancomycin 1250mg  IV x 1 (15mg /kg) started within 60 to 120 minutes prior to initial surgical incision.  Vancomycin doses may be repeated intraoperatively in 2 half-lives (approximately 8 to 12 hours in patients with normal renal function) if procedure is lengthy or if there is excessive blood loss (contact pharmacy if needed)  Recent Labs  Lab 06/28/20 1021  WBC 7.6  CREATININE 1.10*    Estimated Creatinine Clearance: 49.8 mL/min (A) (by C-G formula based on SCr of 1.1 mg/dL (H)).    Allergies  Allergen Reactions  . Bee Venom Anaphylaxis  . Fire [employer] Anaphylaxis  . Other Anaphylaxis    Green peppers  . Ciprofloxacin Nausea And Vomiting  . Cyclobenzaprine Nausea And Vomiting  . Diazepam Nausea And Vomiting  . Ketorolac     HALLUCINATIONS  . Ondansetron Nausea And Vomiting    ABRUPT VOMITTING  . Ranitidine Hcl     FEELS LIKE BUGS CRAWLING ALL OVER HER  . Ambien [Zolpidem] Other (See Comments)    Severely sedating  . Bacitracin-Polymyxin B     BURNS SKIN  . Mushroom Extract Complex Nausea And Vomiting  . Neomycin-Polymyxin-Gramicidin Rash    BURNS SKIN     Thank you for allowing pharmacy to be a part of this patient's care.  Lu Duffel, PharmD, BCPS Clinical Pharmacist 07/05/2020 9:38 AM

## 2020-07-05 NOTE — Anesthesia Procedure Notes (Signed)
Procedure Name: Intubation Date/Time: 07/05/2020 1:38 PM Performed by: Lia Foyer, CRNA Pre-anesthesia Checklist: Patient identified, Emergency Drugs available, Suction available and Patient being monitored Patient Re-evaluated:Patient Re-evaluated prior to induction Oxygen Delivery Method: Circle system utilized Preoxygenation: Pre-oxygenation with 100% oxygen Induction Type: IV induction Ventilation: Mask ventilation without difficulty and Oral airway inserted - appropriate to patient size Laryngoscope Size: McGraph and 3 Grade View: Grade I Tube type: Oral Tube size: 7.0 mm Number of attempts: 1 Airway Equipment and Method: Stylet and Oral airway Placement Confirmation: ETT inserted through vocal cords under direct vision,  positive ETCO2 and breath sounds checked- equal and bilateral Secured at: 21 cm Tube secured with: Tape Dental Injury: Teeth and Oropharynx as per pre-operative assessment

## 2020-07-05 NOTE — Progress Notes (Signed)
PHARMACIST - PHYSICIAN COMMUNICATION  CONCERNING:  Enoxaparin (Lovenox) for DVT Prophylaxis    RECOMMENDATION: Patient was prescribed enoxaprin 40mg  q24 hours for VTE prophylaxis.   Filed Weights   07/05/20 1200  Weight: 82.3 kg (181 lb 7 oz)    Body mass index is 32.14 kg/m.  Estimated Creatinine Clearance: 54.7 mL/min (by C-G formula based on SCr of 1 mg/dL).   Based on [place] patient is candidate for enoxaparin 0.5mg /kg TBW SQ every 24 hours based on BMI being >30.  DESCRIPTION: Pharmacy has adjusted enoxaparin dose per [facility] policy.  Patient is now receiving enoxaparin 0.5 mg/kg every 24 hours   Renda Rolls, PharmD, [facility] 07/05/2020 8:42 PM

## 2020-07-06 ENCOUNTER — Inpatient Hospital Stay: Payer: Medicare Other

## 2020-07-06 ENCOUNTER — Encounter: Payer: Self-pay | Admitting: Neurosurgery

## 2020-07-06 DIAGNOSIS — E1142 Type 2 diabetes mellitus with diabetic polyneuropathy: Secondary | ICD-10-CM

## 2020-07-06 LAB — BASIC METABOLIC PANEL
Anion gap: 8 (ref 5–15)
BUN: 32 mg/dL — ABNORMAL HIGH (ref 8–23)
CO2: 23 mmol/L (ref 22–32)
Calcium: 7.8 mg/dL — ABNORMAL LOW (ref 8.9–10.3)
Chloride: 103 mmol/L (ref 98–111)
Creatinine, Ser: 0.98 mg/dL (ref 0.44–1.00)
GFR, Estimated: >60 mL/min (ref >60)
Glucose, Bld: 179 mg/dL — ABNORMAL HIGH (ref 70–99)
Potassium: 5.2 mmol/L — ABNORMAL HIGH (ref 3.5–5.1)
Sodium: 134 mmol/L — ABNORMAL LOW (ref 135–145)

## 2020-07-06 LAB — GLUCOSE, CAPILLARY
Glucose-Capillary: 146 mg/dL — ABNORMAL HIGH (ref 70–99)
Glucose-Capillary: 222 mg/dL — ABNORMAL HIGH (ref 70–99)
Glucose-Capillary: 214 mg/dL — ABNORMAL HIGH (ref 70–99)
Glucose-Capillary: 164 mg/dL — ABNORMAL HIGH (ref 70–99)

## 2020-07-06 LAB — CBC
HCT: 26.8 % — ABNORMAL LOW (ref 36.0–46.0)
Hemoglobin: 8.7 g/dL — ABNORMAL LOW (ref 12.0–15.0)
MCH: 30.7 pg (ref 26.0–34.0)
MCHC: 32.5 g/dL (ref 30.0–36.0)
MCV: 94.7 fL (ref 80.0–100.0)
Platelets: 270 10*3/uL (ref 150–400)
RBC: 2.83 MIL/uL — ABNORMAL LOW (ref 3.87–5.11)
RDW: 13.4 % (ref 11.5–15.5)
WBC: 12.9 10*3/uL — ABNORMAL HIGH (ref 4.0–10.5)
nRBC: 0 % (ref 0.0–0.2)

## 2020-07-06 MED ORDER — CHLORHEXIDINE GLUCONATE CLOTH 2 % EX PADS
6.0000 | MEDICATED_PAD | Freq: Every day | CUTANEOUS | Status: DC
  Administered 2020-07-08: 6 via TOPICAL

## 2020-07-06 MED ORDER — LORAZEPAM 1 MG PO TABS
1.0000 mg | ORAL_TABLET | Freq: Every day | ORAL | Status: DC
  Administered 2020-07-07: 1 mg via ORAL
  Filled 2020-07-06 (×2): qty 1

## 2020-07-06 MED ORDER — ALBUTEROL SULFATE (2.5 MG/3ML) 0.083% IN NEBU
2.5000 mg | INHALATION_SOLUTION | Freq: Two times a day (BID) | RESPIRATORY_TRACT | Status: DC
  Administered 2020-07-06 – 2020-07-09 (×5): 2.5 mg via RESPIRATORY_TRACT
  Filled 2020-07-06 (×5): qty 3

## 2020-07-06 MED ORDER — INSULIN GLARGINE 100 UNIT/ML ~~LOC~~ SOLN
35.0000 [IU] | Freq: Every day | SUBCUTANEOUS | Status: DC
  Administered 2020-07-06: 35 [IU] via SUBCUTANEOUS
  Filled 2020-07-06 (×2): qty 0.35

## 2020-07-06 NOTE — Evaluation (Signed)
Occupational Therapy Evaluation Patient Details Name: Victoria Austin MRN: [id number] DOB: 27-Mar-1951 Today's Date: 07/06/2020    History of Present Illness Pt is a 69 yo female who who presented with prior L1 compression fracture with pseudoarthrosis after prior spinal fusion in April of 2021.  Pt is now s/p posterior spinal fusion T11-L4, posterolateral arthrodesis T11 to L4, posterior segmental instrumentation T11 to L4 using Depuy Expedium, and bone marrow aspirate.  PMH includes: sleep apnea, HTN, anxiety, depression, bipolar disorder, CVA, hiatal hernia, GERD, arthritis, fibromyalgia, anemia, CKD III, DM, neuropathy, and scoliosis.   Clinical Impression   Patient presenting with decreased I in self care, balance, functional mobility/transfers, endurance, and safety awareness. Patient reports living at home with family at mod I level PTA. If pt is having a "bad" day they assist with LB self care and her daughter assists her up flight of stairs for shower. She will have 24/7 assistance. She sleeps on a bed downstairs that she has fallen out of several times and she can no longer sleep in it. No other bed option available for her therefore OT recommends hospital bed at discharge for safety and in order to maintain precautions. Patient currently functioning at min A overall with min cuing throughout session for back precautions with self care tasks. Pt able to demonstrate figure four position for LB dressing with effort.  Patient will benefit from acute OT to increase overall independence in the areas of ADLs, functional mobility, and safety awareness in order to safely discharge home with family.     Follow Up Recommendations  No OT follow up;Supervision/Assistance - 24 hour    Equipment Recommendations  Hospital bed;3 in 1 bedside commode    Recommendations for Other Services       Precautions / Restrictions Precautions Precautions: Back Precaution Comments: No Bending, Arching, or  Twisting Restrictions Weight Bearing Restrictions: No Other Position/Activity Restrictions: No back brace required; pt with multiple drains and a wound vac in place      Mobility Bed Mobility Overal bed mobility: Needs Assistance Bed Mobility: Rolling;Sit to Sidelying;Sidelying to Sit Rolling: Supervision Sidelying to sit: Min assist     Sit to sidelying: Min assist General bed mobility comments: seated in recliner chair upon entering the room    Transfers Overall transfer level: Needs assistance Equipment used: Rolling walker (2 wheeled) Transfers: Sit to/from Stand Sit to Stand: Min guard;Min assist         General transfer comment: Pt somewhat anxious during transfer training and initially required min A to stand but progressed to CGA with cues for sequencing for back precaution compliance    Balance Overall balance assessment: Needs assistance   Sitting balance-Leahy Scale: Good     Standing balance support: Bilateral upper extremity supported;During functional activity Standing balance-Leahy Scale: Good                             ADL either performed or assessed with clinical judgement   ADL Overall ADL's : Needs assistance/impaired     Grooming: Wash/dry hands;Wash/dry face;Oral care;Sitting;Set up               Lower Body Dressing: Min guard;Sit to/from stand Lower Body Dressing Details (indicate cue type and reason): figure four position to don B socks while maintaining precautions                     Vision Patient Visual Report: No change from  baseline       Perception     Praxis      Pertinent Vitals/Pain Pain Assessment: Faces Faces Pain Scale: Hurts little more Pain Location: low back Pain Descriptors / Indicators: Discomfort Pain Intervention(s): Limited activity within patient's tolerance;Repositioned;Patient requesting pain meds-RN notified     Hand Dominance     Extremity/Trunk Assessment Upper Extremity  Assessment Upper Extremity Assessment: Generalized weakness   Lower Extremity Assessment Lower Extremity Assessment: Generalized weakness;RLE deficits/detail;LLE deficits/detail RLE Deficits / Details: RLE strength grossly 3+ to 4-/5 and equal left vs right RLE Sensation: WNL LLE Deficits / Details: LLE strength grossly 3+ to 4-/5 and equal left vs right LLE Sensation: WNL       Communication Communication Communication: No difficulties   Cognition Arousal/Alertness: Awake/alert Behavior During Therapy: WFL for tasks assessed/performed Overall Cognitive Status: Within Functional Limits for tasks assessed                     General Comments       Exercises Total Joint Exercises Ankle Circles/Pumps: AROM;Strengthening;Both;10 reps Quad Sets: Strengthening;Both;10 reps Gluteal Sets: Strengthening;Both;10 reps Heel Slides: Strengthening;Both;10 reps Hip ABduction/ADduction: Strengthening;Both;10 reps Long Arc Quad: Strengthening;Both;10 reps Knee Flexion: Strengthening;Both;10 reps Marching in Standing: AROM;Strengthening;Both;5 reps;Standing Other Exercises Other Exercises: HEP education for BLE APs, QS, and GS x 10 each every 1-2 hours Other Exercises: General back precaution education provided verbally and during functional activity including log roll training   Shoulder Instructions      Home Living Family/patient expects to be discharged to:: Private residence Living Arrangements: Children;Other relatives Available Help at Discharge: Available 24 hours/day Type of Home: House Home Access: Stairs to enter Entrance Stairs-Number of Steps: 4 Entrance Stairs-Rails: None Home Layout: Two level;1/2 bath on main level;Able to live on main level with bedroom/bathroom Alternate Level Stairs-Number of Steps: 1/2 bath on main level Alternate Level Stairs-Rails: Right Bathroom Shower/Tub: Tub/shower unit   Bathroom Toilet: Handicapped height     Home Equipment:  Environmental consultant - 4 wheels;Cane - single point;Toilet riser;Grab bars - tub/shower;Shower seat;Bedside commode          Prior Functioning/Environment Level of Independence: Independent with assistive device(s);Needs assistance  Gait / Transfers Assistance Needed: Mod Ind amb with a rollator, +2 HHA up/down steps, multiple falls in the last year secondary to legs "giving out" and one sliding out of bed ADL's / Homemaking Assistance Needed: pt's daughter assists with meal prep and stays in the bathroom while pt showers for safety   Comments: Pt reports having multiple falls from bed and now only has recliner chair as possible place to sleep. Pt likely needs hospital bed for safety and comfort after procedure and now having back precautions.        OT Problem List: Decreased strength;Decreased activity tolerance;Decreased cognition;Impaired balance (sitting and/or standing);Decreased safety awareness      OT Treatment/Interventions: Self-care/ADL training;Therapeutic exercise;Balance training;Therapeutic activities;Energy conservation;Cognitive remediation/compensation;DME and/or AE instruction;Patient/family education    OT Goals(Current goals can be found in the care plan section) Acute Rehab OT Goals Patient Stated Goal: to move better at home OT Goal Formulation: With patient Time For Goal Achievement: 07/20/20 Potential to Achieve Goals: Good ADL Goals Pt Will Perform Grooming: with modified independence;standing Pt Will Perform Lower Body Dressing: with modified independence;sit to/from stand Pt Will Transfer to Toilet: with modified independence;ambulating Pt Will Perform Toileting - Clothing Manipulation and hygiene: with modified independence;sit to/from stand Pt Will Perform Tub/Shower Transfer: with supervision;ambulating;shower seat  OT Frequency: Min  2X/week   Barriers to D/C:    none known at this time                     AM-PAC OT "6 Clicks" Daily Activity      Outcome Measure Help from another person eating meals?: None Help from another person taking care of personal grooming?: A Little Help from another person toileting, which includes using toliet, bedpan, or urinal?: A Little Help from another person bathing (including washing, rinsing, drying)?: A Little Help from another person to put on and taking off regular upper body clothing?: None Help from another person to put on and taking off regular lower body clothing?: A Little 6 Click Score: 20   End of Session Equipment Utilized During Treatment: Rolling walker Nurse Communication: Mobility status;Precautions  Activity Tolerance: Patient tolerated treatment well Patient left: in bed;with call bell/phone within reach;with chair alarm set  OT Visit Diagnosis: Repeated falls (R29.6);Muscle weakness (generalized) (M62.81);History of falling (Z91.81)                Time: 7622-6333 OT Time Calculation (min): 26 min Charges:  OT General Charges $OT Visit: 1 Visit OT Evaluation $OT Eval Low Complexity: 1 Low OT Treatments $Self Care/Home Management : 8-22 mins Darleen Crocker, MS, OTR/L , CBIS ascom [phone]  07/06/20, 3:56 PM

## 2020-07-06 NOTE — Evaluation (Addendum)
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  Pt is now s/p posterior spinal fusion T11-L4, posterolateral arthrodesis T11 to L4, posterior segmental instrumentation T11 to L4 using Depuy Expedium, and bone marrow aspirate.  PMH includes: sleep apnea, HTN, anxiety, depression, bipolar disorder, CVA, hiatal hernia, GERD, arthritis, fibromyalgia, anemia, CKD III, DM, neuropathy, and scoliosis.    Clinical Impression  Per MD Dr. Izora Ribas ok for pt to participate fully with PT with Ka at 5.2.  Pt was pleasant and motivated to participate during the session and ultimately performed well without any back pain during the session.  Pt required min A with bed mobility and cues for proper sequencing during log roll training.  Pt has a history of falling out of her low bed with difficulty with general bed mobility tasks and would benefit from a hospital bed at discharge for improved compliance with back precautions and decreased risk for falls.  Pt initially had difficulty coming to standing but with practice and cues she was able to do so without assistance from an elevated surface.  Pt was very cautious during her first few steps at the EOB but gradually became more confident in herself and was able to ambulate with a RW around 100 feet with good stability.  Pt will benefit from HHPT services upon discharge to safely address deficits listed in patient problem list for decreased caregiver assistance and eventual return to PLOF.      Follow Up Recommendations Home health PT;Supervision for mobility/OOB    Equipment Recommendations  Rolling walker with 5" wheels;Hospital bed    Recommendations for Other Services       Precautions / Restrictions  Precautions Precautions: Back Precaution Comments: No Bending, Arching, or Twisting Restrictions Weight Bearing Restrictions: No Other Position/Activity Restrictions: No back brace required; pt with multiple drains and a wound vac in place      Mobility  Bed Mobility Overal bed mobility: Needs Assistance Bed Mobility: Rolling;Sit to Sidelying;Sidelying to Sit Rolling: Supervision Sidelying to sit: Min assist     Sit to sidelying: Min assist General bed mobility comments: Log roll education and training provided with multiple rolls left/right and sidelying to/from sit with pt requiring min A for BLE and trunk control    Transfers Overall transfer level: Needs assistance Equipment used: Rolling walker (2 wheeled) Transfers: Sit to/from Stand Sit to Stand: Min guard;Min assist         General transfer comment: Pt somewhat anxious during transfer training and initially required min A to stand but progressed to CGA with cues for sequencing for back precaution compliance  Ambulation/Gait   Gait Distance (Feet): 100 Feet Assistive device: Rolling walker (2 wheeled) Gait Pattern/deviations: Step-through pattern;Decreased step length - right;Decreased step length - left Gait velocity: decreased   General Gait Details: Slow, cautious gait initially but cadence gradually increased with pt stating that her confidence grew as her walking progressed; no instability/buckling noted  Stairs            Wheelchair Mobility    Modified Rankin (Stroke Patients Only)       Balance Overall balance assessment: Needs assistance   Sitting balance-Leahy Scale: Good     Standing balance support: Bilateral upper extremity supported;During functional activity Standing balance-Leahy Scale: Good  Pertinent Vitals/Pain Pain Assessment: No/denies pain    Home Living Family/patient expects to be discharged to:: Private residence Living  Arrangements: Children;Other relatives Available Help at Discharge: Available 24 hours/day Type of Home: House Home Access: Stairs to enter Entrance Stairs-Rails: None Entrance Stairs-Number of Steps: 4 Home Layout: Two level;1/2 bath on main level;Able to live on main level with bedroom/bathroom Home Equipment: Walker - 4 wheels;Cane - single point;Toilet riser;Grab bars - tub/shower;Shower seat;Bedside commode      Prior Function Level of Independence: Independent with assistive device(s);Needs assistance   Gait / Transfers Assistance Needed: Mod Ind amb with a rollator, +2 HHA up/down steps, multiple falls in the last year secondary to legs "giving out" and one sliding out of bed           Hand Dominance        Extremity/Trunk Assessment   Upper Extremity Assessment Upper Extremity Assessment: Defer to OT evaluation    Lower Extremity Assessment Lower Extremity Assessment: Generalized weakness;RLE deficits/detail;LLE deficits/detail RLE Deficits / Details: RLE strength grossly 3+ to 4-/5 and equal left vs right RLE Sensation: WNL LLE Deficits / Details: LLE strength grossly 3+ to 4-/5 and equal left vs right LLE Sensation: WNL       Communication   Communication: No difficulties  Cognition Arousal/Alertness: Awake/alert Behavior During Therapy: WFL for tasks assessed/performed Overall Cognitive Status: Within Functional Limits for tasks assessed                                        General Comments      Exercises Total Joint Exercises Ankle Circles/Pumps: AROM;Strengthening;Both;10 reps Quad Sets: Strengthening;Both;10 reps Gluteal Sets: Strengthening;Both;10 reps Heel Slides: Strengthening;Both;10 reps Hip ABduction/ADduction: Strengthening;Both;10 reps Long Arc Quad: Strengthening;Both;10 reps Knee Flexion: Strengthening;Both;10 reps Marching in Standing: AROM;Strengthening;Both;5 reps;Standing Other Exercises Other Exercises: HEP  education for BLE APs, QS, and GS x 10 each every 1-2 hours Other Exercises: General back precaution education provided verbally and during functional activity including log roll training   Assessment/Plan    PT Assessment Patient needs continued PT services  PT Problem List Decreased strength;Decreased activity tolerance;Decreased balance;Decreased mobility;Decreased knowledge of use of DME       PT Treatment Interventions DME instruction;Gait training;Stair training;Functional mobility training;Therapeutic activities;Therapeutic exercise;Balance training;Patient/family education    PT Goals (Current goals can be found in the Care Plan section)  Acute Rehab PT Goals Patient Stated Goal: To be able to go up and down steps by myself PT Goal Formulation: With patient Time For Goal Achievement: 07/19/20 Potential to Achieve Goals: Good    Frequency BID   Barriers to discharge        Co-evaluation               AM-PAC PT "6 Clicks" Mobility  Outcome Measure Help needed turning from your back to your side while in a flat bed without using bedrails?: A Little Help needed moving from lying on your back to sitting on the side of a flat bed without using bedrails?: A Little Help needed moving to and from a bed to a chair (including a wheelchair)?: A Little Help needed standing up from a chair using your arms (e.g., wheelchair or bedside chair)?: A Little Help needed to walk in hospital room?: A Little Help needed climbing 3-5 steps with a railing? : A Little 6 Click Score: 18    End of Session  Equipment Utilized During Treatment: Gait belt Activity Tolerance: Patient tolerated treatment well Patient left: in chair;with call bell/phone within reach;with chair alarm set;with SCD's reapplied Nurse Communication: Mobility status PT Visit Diagnosis: Muscle weakness (generalized) (M62.81);Difficulty in walking, not elsewhere classified (R26.2);History of falling (Z91.81)    Time:  4818-5909 PT Time Calculation (min) (ACUTE ONLY): 54 min   Charges:   PT Evaluation $PT Eval Moderate Complexity: 1 Mod PT Treatments $Therapeutic Exercise: 8-22 mins $Therapeutic Activity: 8-22 mins        D. Scott Carlito Bogert PT, DPT 07/06/20, 1:58 PM

## 2020-07-06 NOTE — Progress Notes (Signed)
Procedure: PSF T11-L4 Procedure date: 07/05/2020 Diagnosis: pseudoarthrosis    History: Victoria Austin is s/p PSF T11-L4 POD1: Victoria Austin reports that she feels quite well this morning. Slept well, denies pain. Medicine is following.  POD0: Tolerated procedure well. Evaluated in post op recovery still disoriented from anesthesia but able to answer questions and obey commands.   Physical Exam: Vitals:   07/06/20 0728 07/06/20 0900  BP: (!) 94/44   Pulse: 77   Resp: 17   Temp: 98.6 F (37 C)   SpO2: 91% 97%    General: Alert and oriented, lying in bed Strength:5/5 throughout  Sensation: intact and symmetric throughout  Skin: Prevena incisional vac and 2 drains in place  Data:  Recent Labs  Lab 07/06/20 0523  NA 134*  K 5.2*  CL 103  CO2 23  BUN 32*  CREATININE 0.98  GLUCOSE 179*  CALCIUM 7.8*   No results for input(s): AST, ALT, ALKPHOS in the last 168 hours.  Invalid input(s): TBILI   Recent Labs  Lab 07/05/20 1625 07/06/20 0523  WBC 12.6* 12.9*  HGB 9.8* 8.7*  HCT 30.5* 26.8*  PLT 307 270   No results for input(s): APTT, INR in the last 168 hours.       Assessment/Plan:  Victoria Austin is POD1 s/p PSF T11-L4. Will continue to monitor.   #PSF T11-L4 - monitor drain output - mobilize - pain control - PTOT - Brace - Imaging  #DVT Prophylaxis - DVT prophylaxis Lovenox  #DM2 - appreciate medicine assistance with management of comorbidities   Lonell Face, NP Department of Neurosurgery

## 2020-07-06 NOTE — TOC Initial Note (Signed)
Transition of Care [facility]) - Initial/Assessment Note    Patient Details  Name: Victoria Austin MRN: [id number] Date of Birth: 05-Dec-1950  [facility]) CM/SW Contact:    Shelbie Ammons, RN Phone Number: 07/06/2020, 3:47 PM  Clinical Narrative:      RNCM assessed patient in room. Patient is one day post op from spinal surgery. RNCM discussed discharge planning with patient and she reports that she has already been contacted by [facility] and is set up for them to start services when she is discharged. Discussed recommendations for a rolling walker and hospital bed, both of which patient is agreeable to. She reports she thinks a hospital bed would be of benefit to her.  RNCM reached out to [place] with [facility] and they have patient ready for service.  RNCM reached out to [place] with [facility] to provide equipment.   Due to S/P Lumbar Fusion patient requires frequent changes in body position AND/OR has an immediate need for a change in body position" AND   "The patient has a medical condition which requires positioning of the body in ways not feasible with an ordinary bed", and  "The patient requires positioning of the body in ways not feasible with an ordinary bed in order to alleviate pain"               Expected Discharge Plan: [place] Barriers to Discharge: No Barriers Identified   Patient Goals and CMS Choice        Expected Discharge Plan and Services Expected Discharge Plan: [place] arrangements for the past 2 months: Single Family Home                 DME Arranged: Walker rolling,Hospital bed DME Agency: [facility] Date DME Agency Contacted: 07/06/20 Time DME Agency Contacted: [phone] Representative spoke with at DME Agency: [place]: PT,OT [place] Agency: [facility] [place] Date [place]: 07/06/20 Time [place]: 27 Representative spoke with at [place]: Joelene Millin  Prior  Living Arrangements/Services Living arrangements for the past 2 months: Single Family Home Lives with:: Self Patient language and need for interpreter reviewed:: Yes Do you feel safe going back to the place where you live?: Yes      Need for Family Participation in Patient Care: Yes (Comment) Care giver support system in place?: Yes (comment)   Criminal Activity/Legal Involvement Pertinent to Current Situation/Hospitalization: No - Comment as needed  Activities of Daily Living   ADL Screening (condition at time of admission) Patient's cognitive ability adequate to safely complete daily activities?: Yes Is the patient deaf or have difficulty hearing?: No Does the patient have difficulty seeing, even when wearing glasses/contacts?: No Does the patient have difficulty concentrating, remembering, or making decisions?: No Patient able to express need for assistance with ADLs?: Yes Independently performs ADLs?: Yes (appropriate for developmental age) Does the patient have difficulty walking or climbing stairs?: Yes Weakness of Legs: Both Weakness of Arms/Hands: Both  Permission Sought/Granted                  Emotional Assessment Appearance:: Appears stated age Attitude/Demeanor/Rapport: Engaged Affect (typically observed): Appropriate,Calm Orientation: : Oriented to Situation,Oriented to  Time,Oriented to Place,Oriented to Self Alcohol / Substance Use: Not Applicable Psych Involvement: No (comment)  Admission diagnosis:  Lumbar radiculopathy [M54.16] Patient Active Problem List   Diagnosis Date Noted  . Lumbar radiculopathy 07/05/2020  . Nephrolithiasis 03/21/2020  .  Hypertension associated with diabetes ([place]) 12/21/2019  . History of back surgery 12/21/2019  . S/P lumbar fusion 11/03/2019  . Hereditary essential tremor 10/08/2019  . Polypharmacy 10/08/2019  . OSA on CPAP 09/01/2019  . Anticoagulant long-term use 09/01/2019  . DM type 2 with diabetic peripheral  neuropathy ([place]) 08/26/2019  . Hyperparathyroidism due to renal insufficiency ([place]) 08/26/2019  . 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 Lumbar degenerative disc disease 06/08/2019  . Lumbar facet arthropathy 06/08/2019  . Chronic pain syndrome 06/08/2019  . Chronic kidney disease due to type 2 diabetes mellitus ([place]) 06/07/2019  . Benign essential HTN 04/15/2019  . Constipation 04/13/2019  . Vertigo 04/13/2019  . Hypertriglyceridemia 04/13/2019  . 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 Diabetic neuropathy ([place]) 02/15/2019  . History of CVA (cerebrovascular accident) 01/08/2019  . Incontinence   . RA (rheumatoid arthritis) ([place])   . History of temporal arteritis 01/01/2019  . Albuminuria 12/18/2018  . Dyslipidemia associated with type 2 diabetes mellitus ([place]) 12/11/2018  . 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 Anxiety   . Diverticulitis 05/05/2018  . Fibromyalgia 03/21/2016  . Irritable bowel syndrome 09/19/2014  . 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 GERD (gastroesophageal reflux disease) 04/03/2011  . Hiatal hernia 01/12/2007  . Bipolar 1 disorder ([place]) 07/29/2004   PCP:  Donnamarie Rossetti, PA-C Pharmacy:   [facility] [address] - [address] [place], [place] [address] [place] [address] Phone: [phone] Fax: [phone]  [facility] - [place], [address] [place] [address] Phone: [phone] Fax: [phone]  [id number] [place], [place] - [address] [place] [address] Phone: [phone] Fax: [phone]     Social Determinants of Health (SDOH) Interventions    Readmission Risk Interventions No flowsheet data found.

## 2020-07-06 NOTE — Plan of Care (Signed)
  Problem: Education: Goal: Knowledge of General Education information will improve Description Including pain rating scale, medication(s)/side effects and non-pharmacologic comfort measures Outcome: Progressing   

## 2020-07-06 NOTE — Progress Notes (Addendum)
PROGRESS NOTE    Victoria Austin  YTK:[id number] DOB: [birth year] DOA: 07/05/2020 PCP: Donnamarie Rossetti, PA-C   Brief Narrative:  HPI: Victoria Austin is a 69 y.o. female with medical history significant for lumbar radiculopathy status post lumbar fusion in April 2021, type 2 diabetes mellitus, with most recent hemoglobin A1c noted to be 6.9% when checked in April 2021, hypertension, hyperlipidemia, obstructive sleep apnea noncompliant with home CPAP, who is admitted to [facility] on 07/05/2020 neurosurgery service for elective hardware removal relating to prior lumbar fusion surgery.   In the setting of history of lumbar radiculopathy the patient underwent underwent T11-L3 posterior fusion in April 2021.  Today, the patient has been admitted for elective associated hardware removal, which was performed earlier today in the setting of intraoperative overt complications.  Postoperative consulted the hospitalist service management of the patient's multiple chronic medical problems, which include type 2 diabetes mellitus, hypertension, hyperlipidemia.  The patient currently reports pain control.  She denies any recent chest pain, shortness of breath, palpitations, or diaphoresis.  Denies any recent nausea/vomiting.   While her home medication list currently basal insulin in the form of Lantus 80 units subcu nightly, the patient reports that she actually takes 50 units of Lantus on a nightly basis.  This is in addition to sliding scale NovoLog taken 3 times daily with meals.  Per chart review, it appears that most recent hemoglobin A1c was checked in April 2021, and found to be 6.9% at that time.  Her diabetes is complicated neuropathy, for which she takes gabapentin at home.  In addition to this insulin regimen, she is also on empagliflozin at home.  In the setting of a history of essential hypertension, outpatient antihypertensive regimen includes lisinopril as well as Norvasc.   She knowledges a history of hyperlipidemia, for which she is on gemfibrozil at home, and reports that she is not currently on as an outpatient.  She knowledges a history of obstructive sleep apnea, but reports poor compliance with use of nocturnal CPAP as an outpatient.  Assessment & Plan:   Principal Problem:   DM type 2 with diabetic peripheral neuropathy ([place]) Active Problems:   Dyslipidemia associated with type 2 diabetes mellitus (HCC)   Diabetic neuropathy (HCC)   Benign essential HTN   Lumbar radiculopathy   #) Type 2 diabetes mellitus: Complicated by polyperipheral neuropathy for which she is on gabapentin.  Outpatient insulin regimen, per the patient, consist of Lantus 50 units subcu nightly as well as sliding scale NovoLog 3 times daily with meals. She is also on Empagliflozin.  She was started on 25 units nightly here.  Slightly hyperglycemic.  Increase Lantus to 35 units and continue SSI.  #) Essential hypertension: On lisinopril as well as Norvasc at home which were continued.  Currently slightly hypotensive.  We will discontinue both of them and monitor for now.  #) Hyperlipidemia: Continue gemfibrozil.  #) Obstructive sleep apnea: Poorly compliant with nocturnal CPAP as an outpatient.  #) Lumbar radiculopathy: Status post T11-L3 posterior fusion in April 2021, with continued pain.  Now  s/p PSF T11-L4.  Management per primary service/neurosurgery.  Hyperkalemia: 5.2 only.  Should improve by tomorrow without any intervention.  Repeat BMP in the morning.  DVT prophylaxis: enoxaparin (LOVENOX) injection 40 mg Start: 07/06/20 0800 SCD's Start: 07/05/20 1747 Place TED hose Start: 07/05/20 0918   Code Status: Full Code  Family Communication: None present at bedside.  Plan of care discussed with patient in length and  he verbalized understanding and agreed with it.     Estimated body mass index is 32.14 kg/m as calculated from the following:   Height as of this  encounter: 5\' 3"  (1.6 m).   Weight as of this encounter: 82.3 kg.      Nutritional status:               Consultants:   Hospitalist  Procedures:   As above  Antimicrobials:  Anti-infectives (From admission, onward)   Start     Dose/Rate Route Frequency Ordered Stop   07/05/20 1618  vancomycin (VANCOCIN) powder  Status:  Discontinued          As needed 07/05/20 1625 07/05/20 1739   07/05/20 1236  ceFAZolin (ANCEF) 2-4 GM/100ML-% IVPB       Note to Pharmacy: Norton Blizzard  : cabinet override      07/05/20 1236 07/05/20 1355   07/05/20 1145  ceFAZolin (ANCEF) IVPB 2g/100 mL premix        2 g 200 mL/hr over 30 Minutes Intravenous  Once 07/05/20 1142 07/05/20 1341   07/05/20 1145  vancomycin (VANCOREADY) IVPB 1250 mg/250 mL        1,250 mg 166.7 mL/hr over 90 Minutes Intravenous  Once 07/05/20 1142 07/06/20 0812         Subjective: Seen and examined.  Patient feels very weak.  No specific complaint.  Denies pain, shortness of breath, chest pain or any other complaint.  Slightly lethargic but oriented x4.  Objective: Vitals:   07/05/20 2240 07/06/20 0517 07/06/20 0728 07/06/20 0900  BP: 133/76 (!) 108/45 (!) 94/44   Pulse: 74 80 77   Resp: 18 17 17    Temp: 98 F (36.7 C) 98.6 F (37 C) 98.6 F (37 C)   TempSrc: Oral Oral    SpO2: 96% 98% 91% 97%  Weight:      Height:        Intake/Output Summary (Last 24 hours) at 07/06/2020 1107 Last data filed at 07/06/2020 1013 Gross per 24 hour  Intake 2360 ml  Output 2950 ml  Net -590 ml   Filed Weights   07/05/20 1200  Weight: 82.3 kg    Examination:  General exam: Appears calm and comfortable but slightly lethargic Respiratory system: Clear to auscultation. Respiratory effort normal. Cardiovascular system: S1 & S2 heard, RRR. No JVD, murmurs, rubs, gallops or clicks. No pedal edema. Gastrointestinal system: Abdomen is nondistended, soft and nontender. No organomegaly or masses felt. Normal bowel  sounds heard. Central nervous system: Slightly lethargic but fully oriented. No focal neurological deficits. Extremities: Symmetric 5 x 5 power. Skin: No rashes, lesions or ulcers Psychiatry: Judgement and insight appear normal. Mood & affect appropriate.    Data Reviewed: I have personally reviewed following labs and imaging studies  CBC: Recent Labs  Lab 07/05/20 1212 07/05/20 1625 07/06/20 0523  WBC  --  12.6* 12.9*  HGB 11.6* 9.8* 8.7*  HCT 34.0* 30.5* 26.8*  MCV  --  93.6 94.7  PLT  --  307 213   Basic Metabolic Panel: Recent Labs  Lab 07/05/20 1212 07/06/20 0523  NA 142 134*  K 4.9 5.2*  CL 106 103  CO2  --  23  GLUCOSE 166* 179*  BUN 31* 32*  CREATININE 1.00 0.98  CALCIUM  --  7.8*   GFR: Estimated Creatinine Clearance: 55.9 mL/min (by C-G formula based on SCr of 0.98 mg/dL). Liver Function Tests: No results for input(s): AST, ALT, ALKPHOS, BILITOT,  PROT, ALBUMIN in the last 168 hours. No results for input(s): LIPASE, AMYLASE in the last 168 hours. No results for input(s): AMMONIA in the last 168 hours. Coagulation Profile: No results for input(s): INR, PROTIME in the last 168 hours. Cardiac Enzymes: No results for input(s): CKTOTAL, CKMB, CKMBINDEX, TROPONINI in the last 168 hours. BNP (last 3 results) No results for input(s): PROBNP in the last 8760 hours. HbA1C: No results for input(s): HGBA1C in the last 72 hours. CBG: Recent Labs  Lab 07/05/20 1157 07/05/20 1744 07/05/20 2121 07/06/20 0725  GLUCAP 168* 199* 232* 146*   Lipid Profile: No results for input(s): CHOL, HDL, LDLCALC, TRIG, CHOLHDL, LDLDIRECT in the last 72 hours. Thyroid Function Tests: No results for input(s): TSH, T4TOTAL, FREET4, T3FREE, THYROIDAB in the last 72 hours. Anemia Panel: No results for input(s): VITAMINB12, FOLATE, FERRITIN, TIBC, IRON, RETICCTPCT in the last 72 hours. Sepsis Labs: No results for input(s): PROCALCITON, LATICACIDVEN in the last 168 hours.  Recent  Results (from the past 240 hour(s))  Surgical pcr screen     Status: None   Collection Time: 06/28/20 10:20 AM   Specimen: Nasal Mucosa; Nasal Swab  Result Value Ref Range Status   MRSA, PCR NEGATIVE NEGATIVE Final   Staphylococcus aureus NEGATIVE NEGATIVE Final    Comment: (NOTE) The Xpert SA Assay (FDA approved for NASAL specimens in patients 38 years of age and older), is one component of a comprehensive surveillance program. It is not intended to diagnose infection nor to guide or monitor treatment. Performed at [facility], [address] [place], [place] [address]   Urine culture     Status: Abnormal   Collection Time: 06/28/20 10:54 AM   Specimen: Urine, Random  Result Value Ref Range Status   Specimen Description   Final    URINE, RANDOM Performed at [facility] [address] [place], [place] [address]    Special Requests   Final    NONE Performed at [facility], [address] [place], [place] [address]    Culture >=100,000 COLONIES/mL ESCHERICHIA COLI (A)  Final   Report Status 07/01/2020 FINAL  Final   Organism ID, Bacteria ESCHERICHIA COLI (A)  Final      Susceptibility   Escherichia coli - MIC*    AMPICILLIN >=32 RESISTANT Resistant     CEFAZOLIN <=4 SENSITIVE Sensitive     CEFEPIME <=0.12 SENSITIVE Sensitive     CEFTRIAXONE <=0.25 SENSITIVE Sensitive     CIPROFLOXACIN <=0.25 SENSITIVE Sensitive     GENTAMICIN <=1 SENSITIVE Sensitive     IMIPENEM <=0.25 SENSITIVE Sensitive     NITROFURANTOIN <=16 SENSITIVE Sensitive     TRIMETH/SULFA >=320 RESISTANT Resistant     AMPICILLIN/SULBACTAM >=32 RESISTANT Resistant     PIP/TAZO <=4 SENSITIVE Sensitive     * >=100,000 COLONIES/mL ESCHERICHIA COLI  SARS CORONAVIRUS 2 (TAT 6-24 HRS) Nasopharyngeal Nasopharyngeal Swab     Status: None   Collection Time: 07/03/20  9:35 AM   Specimen: Nasopharyngeal Swab  Result Value Ref Range Status   SARS Coronavirus 2 NEGATIVE NEGATIVE Final     Comment: (NOTE) SARS-CoV-2 target nucleic acids are NOT DETECTED.  The SARS-CoV-2 RNA is generally detectable in upper and lower respiratory specimens during the acute phase of infection. Negative results do not preclude SARS-CoV-2 infection, do not rule out co-infections with other pathogens, and should not be used as the sole basis for treatment or other patient management decisions. Negative results must be combined with clinical  observations, patient history, and epidemiological information. The expected result is Negative.  Fact Sheet for Patients: SugarRoll.be  Fact Sheet for Healthcare Providers: https://www.woods-mathews.com/  This test is not yet approved or cleared by the [place] FDA and  has been authorized for detection and/or diagnosis of SARS-CoV-2 by FDA under an Emergency Use Authorization (EUA). This EUA will remain  in effect (meaning this test can be used) for the duration of the COVID-19 declaration under Se ction 564(b)(1) of the Act, 21 U.S.C. section 360bbb-3(b)(1), unless the authorization is terminated or revoked sooner.  Performed at [facility], [place] [address], [place], [place] [address]       Radiology Studies: DG Lumbar Spine 2-3 Views  Result Date: 07/05/2020 CLINICAL DATA:  Surgery. EXAM: LUMBAR SPINE - 2-3 VIEW; DG C-ARM 1-60 MIN COMPARISON:  Radiographs November 25 21. FINDINGS: Fluoro time: 6 seconds. Seven C-arm fluoroscopic images were obtained intraoperatively and submitted for post operative interpretation. These images demonstrate posterior rod and screw fixation spanning T11 to L4. Please see the performing provider's procedural report for further detail. IMPRESSION: Intraoperative fluoroscopic images, as detailed above Electronically Signed   By: Margaretha Sheffield MD   On: 07/05/2020 16:44   DG C-Arm 1-60 Min  Result Date: 07/05/2020 CLINICAL DATA:  Surgery. EXAM: LUMBAR SPINE - 2-3  VIEW; DG C-ARM 1-60 MIN COMPARISON:  Radiographs November 25 21. FINDINGS: Fluoro time: 6 seconds. Seven C-arm fluoroscopic images were obtained intraoperatively and submitted for post operative interpretation. These images demonstrate posterior rod and screw fixation spanning T11 to L4. Please see the performing provider's procedural report for further detail. IMPRESSION: Intraoperative fluoroscopic images, as detailed above Electronically Signed   By: Margaretha Sheffield MD   On: 07/05/2020 16:44    Scheduled Meds: . acetaminophen  1,000 mg Oral Q6H  . albuterol  2.5 mg Nebulization BID  . carbamazepine  400 mg Oral TID  . Chlorhexidine Gluconate Cloth  6 each Topical Daily  . docusate sodium  100 mg Oral BID  . DULoxetine  60 mg Oral Daily  . DULoxetine  90 mg Oral QHS  . enoxaparin (LOVENOX) injection  0.5 mg/kg Subcutaneous Q24H  . gabapentin  300 mg Oral QHS  . gemfibrozil  600 mg Oral BID  . insulin aspart  0-15 Units Subcutaneous TID WC  . insulin glargine  25 Units Subcutaneous QHS  . loratadine  10 mg Oral Daily  . methocarbamol  750 mg Oral Q6H  . mirabegron ER  50 mg Oral QHS  . mometasone-formoterol  2 puff Inhalation BID  . pantoprazole  40 mg Oral Daily  . primidone  250 mg Oral QHS  . propranolol  10 mg Oral BID  . rosuvastatin  20 mg Oral Daily  . senna  2 tablet Oral BID  . sodium chloride flush  3 mL Intravenous Q12H   Continuous Infusions: . sodium chloride 50 mL/hr (07/06/20 0826)  . methocarbamol (ROBAXIN) IV       LOS: 1 day   Time spent: 30 min   Darliss Cheney, MD [facility]  07/06/2020, 11:07 AM   To contact the attending provider between 7A-7P or the covering provider during after hours 7P-7A, please log into the web site [email or web address].

## 2020-07-06 NOTE — Progress Notes (Signed)
OT Cancellation Note  Patient Details Name: Victoria Austin MRN: [id number] DOB: 11/22/1950   Cancelled Treatment:    Reason Eval/Treat Not Completed: Other (comment). OT order received and chart reviewed. Pt currently working with PT. OT will follow up when pt is available.   Darleen Crocker, [place], OTR/L , CBIS ascom [phone]  07/06/20, 10:05 AM   07/06/2020, 10:04 AM

## 2020-07-07 LAB — GLUCOSE, CAPILLARY
Glucose-Capillary: 186 mg/dL — ABNORMAL HIGH (ref 70–99)
Glucose-Capillary: 221 mg/dL — ABNORMAL HIGH (ref 70–99)
Glucose-Capillary: 117 mg/dL — ABNORMAL HIGH (ref 70–99)
Glucose-Capillary: 227 mg/dL — ABNORMAL HIGH (ref 70–99)

## 2020-07-07 LAB — URINALYSIS, COMPLETE (UACMP) WITH MICROSCOPIC
Bilirubin Urine: NEGATIVE
Glucose, UA: >=500 mg/dL — AB
Hgb urine dipstick: NEGATIVE
Ketones, ur: NEGATIVE mg/dL
Nitrite: NEGATIVE
Protein, ur: NEGATIVE mg/dL
Specific Gravity, Urine: 1.014 (ref 1.005–1.030)
pH: 5 (ref 5.0–8.0)

## 2020-07-07 LAB — BASIC METABOLIC PANEL
Anion gap: 9 (ref 5–15)
BUN: 21 mg/dL (ref 8–23)
CO2: 22 mmol/L (ref 22–32)
Calcium: 7.5 mg/dL — ABNORMAL LOW (ref 8.9–10.3)
Chloride: 103 mmol/L (ref 98–111)
Creatinine, Ser: 0.84 mg/dL (ref 0.44–1.00)
GFR, Estimated: >60 mL/min (ref >60)
Glucose, Bld: 178 mg/dL — ABNORMAL HIGH (ref 70–99)
Potassium: 4.4 mmol/L (ref 3.5–5.1)
Sodium: 134 mmol/L — ABNORMAL LOW (ref 135–145)

## 2020-07-07 LAB — CBC
HCT: 21.5 % — ABNORMAL LOW (ref 36.0–46.0)
Hemoglobin: 7.2 g/dL — ABNORMAL LOW (ref 12.0–15.0)
MCH: 31.2 pg (ref 26.0–34.0)
MCHC: 33.5 g/dL (ref 30.0–36.0)
MCV: 93.1 fL (ref 80.0–100.0)
Platelets: 183 10*3/uL (ref 150–400)
RBC: 2.31 MIL/uL — ABNORMAL LOW (ref 3.87–5.11)
RDW: 13.6 % (ref 11.5–15.5)
WBC: 7.2 10*3/uL (ref 4.0–10.5)
nRBC: 0 % (ref 0.0–0.2)

## 2020-07-07 LAB — HEMOGLOBIN AND HEMATOCRIT, BLOOD
HCT: 22.8 % — ABNORMAL LOW (ref 36.0–46.0)
Hemoglobin: 7.4 g/dL — ABNORMAL LOW (ref 12.0–15.0)

## 2020-07-07 MED ORDER — MAGNESIUM HYDROXIDE 400 MG/5ML PO SUSP
30.0000 mL | Freq: Two times a day (BID) | ORAL | Status: DC
  Administered 2020-07-07 – 2020-07-09 (×5): 30 mL via ORAL
  Filled 2020-07-07 (×5): qty 30

## 2020-07-07 MED ORDER — MECLIZINE HCL 12.5 MG PO TABS
12.5000 mg | ORAL_TABLET | Freq: Every day | ORAL | Status: DC | PRN
  Administered 2020-07-07: 12.5 mg via ORAL
  Filled 2020-07-07 (×2): qty 1

## 2020-07-07 MED ORDER — GEMFIBROZIL 600 MG PO TABS
600.0000 mg | ORAL_TABLET | Freq: Two times a day (BID) | ORAL | Status: DC
  Administered 2020-07-07 – 2020-07-09 (×5): 600 mg via ORAL
  Filled 2020-07-07 (×6): qty 1

## 2020-07-07 MED ORDER — TRIMETHOPRIM 100 MG PO TABS
100.0000 mg | ORAL_TABLET | Freq: Every day | ORAL | Status: DC
  Filled 2020-07-07: qty 1

## 2020-07-07 MED ORDER — TRIMETHOPRIM 100 MG PO TABS
100.0000 mg | ORAL_TABLET | Freq: Every day | ORAL | Status: DC
  Administered 2020-07-07 – 2020-07-08 (×2): 100 mg via ORAL
  Filled 2020-07-07 (×3): qty 1

## 2020-07-07 MED ORDER — INSULIN GLARGINE 100 UNIT/ML ~~LOC~~ SOLN
45.0000 [IU] | Freq: Every day | SUBCUTANEOUS | Status: DC
  Administered 2020-07-07 – 2020-07-08 (×2): 45 [IU] via SUBCUTANEOUS
  Filled 2020-07-07 (×3): qty 0.45

## 2020-07-07 MED ORDER — DULOXETINE HCL 60 MG PO CPEP
90.0000 mg | ORAL_CAPSULE | Freq: Every day | ORAL | Status: DC
  Administered 2020-07-08 – 2020-07-09 (×2): 90 mg via ORAL
  Filled 2020-07-07 (×2): qty 1

## 2020-07-07 MED ORDER — LORAZEPAM 0.5 MG PO TABS
0.5000 mg | ORAL_TABLET | Freq: Two times a day (BID) | ORAL | Status: DC | PRN
  Administered 2020-07-08 – 2020-07-09 (×2): 0.5 mg via ORAL
  Filled 2020-07-07 (×2): qty 1

## 2020-07-07 NOTE — NC FL2 (Signed)
[place] LEVEL OF CARE SCREENING TOOL     IDENTIFICATION  Patient Name: Victoria Austin Birthdate: [birth year] Sex: female Admission Date (Current Location): 07/05/2020  [place] and [place] Number:  Engineering geologist and Address:  [facility], [address] [place], [place] [address]      Provider Number: [id number]  Attending Physician Name and Address:  Meade Maw, MD  Relative Name and Phone Number:  Gerda Diss [phone]    Current Level of Care: Hospital Recommended Level of Care: [place] Prior Approval Number:    Date Approved/Denied:   PASRR Number: [id number]  Discharge Plan: SNF    Current Diagnoses: Patient Active Problem List   Diagnosis Date Noted  . Lumbar radiculopathy 07/05/2020  . Nephrolithiasis 03/21/2020  . Hypertension associated with diabetes ([place]) 12/21/2019  . History of back surgery 12/21/2019  . S/P lumbar fusion 11/03/2019  . Hereditary essential tremor 10/08/2019  . Polypharmacy 10/08/2019  . OSA on CPAP 09/01/2019  . Anticoagulant long-term use 09/01/2019  . DM type 2 with diabetic peripheral neuropathy ([place]) 08/26/2019  . Hyperparathyroidism due to renal insufficiency ([place]) 08/26/2019  . 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 Lumbar degenerative disc disease 06/08/2019  . Lumbar facet arthropathy 06/08/2019  . Chronic pain syndrome 06/08/2019  . Chronic kidney disease due to type 2 diabetes mellitus ([place]) 06/07/2019  . Benign essential HTN 04/15/2019  . Constipation 04/13/2019  . Vertigo 04/13/2019  . Hypertriglyceridemia 04/13/2019  . 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 Diabetic neuropathy ([place]) 02/15/2019  . History of CVA (cerebrovascular accident) 01/08/2019  . Incontinence   . RA (rheumatoid arthritis) ([place])   . History of temporal arteritis 01/01/2019  . Albuminuria 12/18/2018  . Dyslipidemia associated with type 2 diabetes mellitus ([place]) 12/11/2018   . 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 Anxiety   . Diverticulitis 05/05/2018  . Fibromyalgia 03/21/2016  . Irritable bowel syndrome 09/19/2014  . 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 GERD (gastroesophageal reflux disease) 04/03/2011  . Hiatal hernia 01/12/2007  . Bipolar 1 disorder ([place]) 07/29/2004    Orientation RESPIRATION BLADDER Height & Weight     Self,Time,Situation,Place  Normal External catheter Weight: 82.3 kg Height:  5\' 3"  (160 cm)  BEHAVIORAL SYMPTOMS/MOOD NEUROLOGICAL BOWEL NUTRITION STATUS      Continent Diet (Carb Modified)  AMBULATORY STATUS COMMUNICATION OF NEEDS Skin   Extensive Assist Verbally Surgical wounds                       Personal Care Assistance Level of Assistance  Bathing,Feeding,Dressing Bathing Assistance: Limited assistance Feeding assistance: Independent Dressing Assistance: Limited assistance     Functional Limitations Info             SPECIAL CARE FACTORS FREQUENCY  PT (By licensed PT),OT (By licensed OT)                    Contractures Contractures Info: Not present    Additional Factors Info  Code Status,Allergies Code Status Info: Full Allergies Info: Bee Venom, Fire Ant, Ciprofloxacin, Cyclobenzaprine, Diazepam, Ketorolac, Ondansetron, Ranitidine, Ambien, Bacitracin, Mushroom, Neomycin           Current Medications (07/07/2020):  This is the current hospital active medication list Current Facility-Administered Medications  Medication Dose Route Frequency Provider Last Rate Last Admin  . 0.9 %  sodium chloride infusion  50 mL/hr Intravenous Continuous Zdeb, Christine, NP 50 mL/hr at 07/06/20 0826 50 mL/hr at 07/06/20 0826  .  acetaminophen (TYLENOL) tablet 1,000 mg  1,000 mg Oral Q6H Zdeb, Christine, NP   1,000 mg at 07/07/20 0612  . albuterol (PROVENTIL) (2.5 MG/3ML) 0.083% nebulizer solution 2.5 mg  2.5 mg Nebulization BID Meade Maw, MD   2.5 mg at 07/07/20 0924  . alum & mag  hydroxide-simeth (MAALOX/MYLANTA) 200-200-20 MG/5ML suspension 30 mL  30 mL Oral Q6H PRN Zdeb, Christine, NP      . bisacodyl (DULCOLAX) EC tablet 5 mg  5 mg Oral Daily PRN Zdeb, Christine, NP      . carbamazepine (TEGRETOL) tablet 400 mg  400 mg Oral TID Lonell Face, NP   400 mg at 07/07/20 0856  . Chlorhexidine Gluconate Cloth 2 % PADS 6 each  6 each Topical Daily Meade Maw, MD      . docusate sodium (COLACE) capsule 100 mg  100 mg Oral BID Lonell Face, NP   100 mg at 07/07/20 0857  . [START ON 07/08/2020] DULoxetine (CYMBALTA) DR capsule 90 mg  90 mg Oral Daily Zdeb, Christine, NP      . enoxaparin (LOVENOX) injection 40 mg  0.5 mg/kg Subcutaneous Q24H Renda Rolls, RPH   40 mg at 07/07/20 0859  . gabapentin (NEURONTIN) capsule 300 mg  300 mg Oral QHS Zdeb, Christine, NP   300 mg at 07/06/20 2151  . gemfibrozil (LOPID) tablet 600 mg  600 mg Oral BID Lonell Face, NP   600 mg at 07/07/20 0858  . insulin aspart (novoLOG) injection 0-15 Units  0-15 Units Subcutaneous TID WC Zdeb, Christine, NP   5 Units at 07/07/20 1211  . insulin glargine (LANTUS) injection 45 Units  45 Units Subcutaneous QHS Pahwani, Ravi, MD      . loratadine (CLARITIN) tablet 10 mg  10 mg Oral Daily Zdeb, Christine, NP   10 mg at 07/07/20 0857  . LORazepam (ATIVAN) tablet 0.5 mg  0.5 mg Oral Q12H PRN Zdeb, Christine, NP      . magnesium hydroxide (MILK OF MAGNESIA) suspension 30 mL  30 mL Oral BID Zdeb, Christine, NP   30 mL at 07/07/20 0856  . meclizine (ANTIVERT) tablet 12.5 mg  12.5 mg Oral Daily PRN Lonell Face, NP   12.5 mg at 07/07/20 0856  . menthol-cetylpyridinium (CEPACOL) lozenge 3 mg  1 lozenge Oral PRN Zdeb, Christine, NP       Or  . phenol (CHLORASEPTIC) mouth spray 1 spray  1 spray Mouth/Throat PRN Zdeb, Christine, NP      . methocarbamol (ROBAXIN) tablet 750 mg  750 mg Oral Q6H Zdeb, Christine, NP   750 mg at 07/07/20 1202   Or  . methocarbamol (ROBAXIN) 500 mg in dextrose 5 % 50 mL  IVPB  500 mg Intravenous Q6H Zdeb, Christine, NP 100 mL/hr at 07/06/20 1819 500 mg at 07/06/20 1819  . mirabegron ER (MYRBETRIQ) tablet 50 mg  50 mg Oral QHS Zdeb, Christine, NP   50 mg at 07/06/20 2152  . mometasone-formoterol (DULERA) 200-5 MCG/ACT inhaler 2 puff  2 puff Inhalation BID Lonell Face, NP   2 puff at 07/07/20 0859  . oxyCODONE (Oxy IR/ROXICODONE) immediate release tablet 10 mg  10 mg Oral Q3H PRN Lonell Face, NP   10 mg at 07/06/20 1945  . oxyCODONE (Oxy IR/ROXICODONE) immediate release tablet 5 mg  5 mg Oral Q3H PRN Zdeb, Christine, NP      . pantoprazole (PROTONIX) EC tablet 40 mg  40 mg Oral Daily Zdeb, Christine, NP   40 mg at  07/07/20 0857  . polyethylene glycol (MIRALAX / GLYCOLAX) packet 17 g  17 g Oral Daily PRN Zdeb, Christine, NP      . primidone (MYSOLINE) tablet 250 mg  250 mg Oral QHS Zdeb, Christine, NP   250 mg at 07/06/20 2322  . promethazine (PHENERGAN) injection 6.25 mg  6.25 mg Intravenous Q6H PRN Zdeb, Christine, NP      . propranolol (INDERAL) tablet 10 mg  10 mg Oral BID Zdeb, Christine, NP   10 mg at 07/07/20 0857  . rosuvastatin (CRESTOR) tablet 20 mg  20 mg Oral Daily Zdeb, Christine, NP   20 mg at 07/07/20 0857  . senna (SENOKOT) tablet 17.2 mg  2 tablet Oral BID Zdeb, Christine, NP   17.2 mg at 07/07/20 0857  . sodium chloride flush (NS) 0.9 % injection 3 mL  3 mL Intravenous Q12H Zdeb, Christine, NP   3 mL at 07/07/20 1341  . sodium chloride flush (NS) 0.9 % injection 3 mL  3 mL Intravenous PRN Zdeb, Christine, NP      . sodium phosphate (FLEET) 7-19 GM/118ML enema 1 enema  1 enema Rectal Once PRN Zdeb, Christine, NP      . trimethoprim (TRIMPEX) tablet 100 mg  100 mg Oral QHS Lonell Face, NP         Discharge Medications: Please see discharge summary for a list of discharge medications.  Relevant Imaging Results:  Relevant Lab Results:   Additional Information SS# [id number]  Shelbie Ammons, RN

## 2020-07-07 NOTE — Progress Notes (Addendum)
Procedure: PSF T11-L4 Procedure date: 07/05/2020 Diagnosis: pseudoarthrosis    History: Victoria Austin is s/p PSF T11-L4 POD2: Victoria Austin continues to deny pain but states that she did not sleep well.  POD1: Victoria Austin reports that she feels quite well this morning. Slept well, denies pain. Medicine is following.  POD0: Tolerated procedure well. Evaluated in post op recovery still disoriented from anesthesia but able to answer questions and obey commands.   Physical Exam: Vitals:   07/07/20 0307 07/07/20 0841  BP: 121/62 (!) 118/54  Pulse: 94 90  Resp: 16 16  Temp: 98.9 F (37.2 C) 99.1 F (37.3 C)  SpO2: 95% 97%    General: Alert and oriented, lying in bed Strength:5/5 throughout  Sensation: intact and symmetric throughout  Skin: Prevena incisional vac and 2 drains in place  Data:  Recent Labs  Lab 07/06/20 0523 07/07/20 0520  NA 134* 134*  K 5.2* 4.4  CL 103 103  CO2 23 22  BUN 32* 21  CREATININE 0.98 0.84  GLUCOSE 179* 178*  CALCIUM 7.8* 7.5*   No results for input(s): AST, ALT, ALKPHOS in the last 168 hours.  Invalid input(s): TBILI   Recent Labs  Lab 07/05/20 1625 07/06/20 0523 07/07/20 0520  WBC 12.6* 12.9* 7.2  HGB 9.8* 8.7* 7.2*  HCT 30.5* 26.8* 21.5*  PLT 307 270 183   No results for input(s): APTT, INR in the last 168 hours.       Assessment/Plan:  Victoria Austin is POD2 s/p PSF T11-L4. Will continue to monitor.  #PSF T11-L4 - monitor drain output; R drain removed this morning - mobilize - pain control - PTOT - Imaging completed - bowel regimen  #Acute blood loss anemia - 7.2/21.5 this am, asymptomatic, will not transfuse - if orthostatic/symptomatic, or recommended by medicine, may consider transfusion  #DM2 - appreciate medicine assistance with management of comorbidities - ideally, would like blood sugar to be <180  #preop UTI - E coli + - treated with 5 days of nitrofurantoin preoperatively - Continues to complain of burning  with urination, will reach out to medicine team to discuss need for additional abx/repeat labs  #DVT Prophylaxis - DVT prophylaxis Lovenox 29mq q24  Lonell Face, NP Department of Neurosurgery

## 2020-07-07 NOTE — Progress Notes (Signed)
Physical Therapy Treatment Patient Details Name: Victoria Austin MRN: [id number] DOB: 12-01-1950 Today's Date: 07/07/2020    History of Present Illness Pt is a 69 yo female who who presented with prior L1 compression fracture with pseudoarthrosis after prior spinal fusion in April of 2021.  Pt is now s/p posterior spinal fusion T11-L4, posterolateral arthrodesis T11 to L4, posterior segmental instrumentation T11 to L4 using Depuy Expedium, and bone marrow aspirate.  PMH includes: sleep apnea, HTN, anxiety, depression, bipolar disorder, CVA, hiatal hernia, GERD, arthritis, fibromyalgia, anemia, CKD III, DM, neuropathy, and scoliosis.    PT Comments    Pt was pleasant and motivated to participate during the session but ultimately presented with decreased functional strength and activity tolerance this session, NP notified and discharge recommendation updated.  Pt required the EOB to be elevated along with multiple attempts in order to come to standing and subjectively reported feeling increased BLE weakness this session.  Pt was able to ambulate 80 feet max this session but again reported feeling more challenged with a shorter walk compared to the AM session.  Pt will benefit from PT services in a SNF setting upon discharge to safely address deficits listed in patient problem list for decreased caregiver assistance and eventual return to PLOF.     Follow Up Recommendations  SNF     Equipment Recommendations  Rolling walker with 5" wheels;Hospital bed    Recommendations for Other Services       Precautions / Restrictions Precautions Precautions: Back Restrictions Weight Bearing Restrictions: No Other Position/Activity Restrictions: No back brace required; one drain and a wound vac in place    Mobility  Bed Mobility Overal bed mobility: Needs Assistance Bed Mobility: Rolling;Sidelying to Sit Rolling: Min assist Sidelying to sit: Min assist       General bed mobility comments: Min A  for BLE and trunk control and min verbal cues for proper sequencing log roll training.  Transfers Overall transfer level: Needs assistance Equipment used: Rolling walker (2 wheeled) Transfers: Sit to/from Stand Sit to Stand: Min guard         General transfer comment: Pt required multiple attempts and an increased bed height in order to stand this session along with cues for general sequencing for back precaution compliance  Ambulation/Gait Ambulation/Gait assistance: Min guard Gait Distance (Feet): 80 Feet Assistive device: Rolling walker (2 wheeled) Gait Pattern/deviations: Step-through pattern;Decreased step length - right;Decreased step length - left Gait velocity: decreased   General Gait Details: Slow cadence but steady without LOB but with decreased activity tolerance this session   Stairs Stairs:  (Pt declined to attempt secondary to weakness)           Wheelchair Mobility    Modified Rankin (Stroke Patients Only)       Balance Overall balance assessment: Needs assistance   Sitting balance-Leahy Scale: Good     Standing balance support: Bilateral upper extremity supported;During functional activity Standing balance-Leahy Scale: Good                              Cognition Arousal/Alertness: Awake/alert Behavior During Therapy: WFL for tasks assessed/performed Overall Cognitive Status: Within Functional Limits for tasks assessed                                        Exercises Total Joint Exercises Ankle Circles/Pumps: AROM;Strengthening;Both;10 reps  Quad Sets: Strengthening;Both;10 reps Heel Slides: Strengthening;Both;10 reps Long Arc Quad: Strengthening;Both;10 reps;15 reps Knee Flexion: Strengthening;Both;10 reps;15 reps Marching in Standing: AROM;Strengthening;Both;5 reps;Standing Other Exercises Other Exercises: General back precaution education and review provided verbally and during functional activity including  log roll training and transfer training    General Comments        Pertinent Vitals/Pain Pain Assessment: No/denies pain    Home Living                      Prior Function            PT Goals (current goals can now be found in the care plan section) Progress towards PT goals: Not progressing toward goals - comment (decreased functional strength and activity tolerance this session)    Frequency    BID      PT Plan Discharge plan needs to be updated    Co-evaluation              AM-PAC PT "6 Clicks" Mobility   Outcome Measure  Help needed turning from your back to your side while in a flat bed without using bedrails?: A Little Help needed moving from lying on your back to sitting on the side of a flat bed without using bedrails?: A Little Help needed moving to and from a bed to a chair (including a wheelchair)?: A Little Help needed standing up from a chair using your arms (e.g., wheelchair or bedside chair)?: A Little Help needed to walk in hospital room?: A Little Help needed climbing 3-5 steps with a railing? : A Lot 6 Click Score: 17    End of Session Equipment Utilized During Treatment: Gait belt Activity Tolerance: Patient tolerated treatment well Patient left: in chair;with call bell/phone within reach;with chair alarm set;with SCD's reapplied Nurse Communication: Mobility status; NP notified of pt's decreased functional strength and activity tolerance PT Visit Diagnosis: Muscle weakness (generalized) (M62.81);Difficulty in walking, not elsewhere classified (R26.2);History of falling (Z91.81)     Time: 8144-8185 PT Time Calculation (min) (ACUTE ONLY): 24 min  Charges:  $Gait Training: 8-22 mins $Therapeutic Exercise: 8-22 mins                     D. Scott Leverne Tessler PT, DPT 07/07/20, 3:54 PM

## 2020-07-07 NOTE — Progress Notes (Signed)
Physical Therapy Treatment Patient Details Name: Victoria Austin MRN: [id number] DOB: May 31, 1951 Today's Date: 07/07/2020    History of Present Illness Pt is a 69 yo female who who presented with prior L1 compression fracture with pseudoarthrosis after prior spinal fusion in April of 2021.  Pt is now s/p posterior spinal fusion T11-L4, posterolateral arthrodesis T11 to L4, posterior segmental instrumentation T11 to L4 using Depuy Expedium, and bone marrow aspirate.  PMH includes: sleep apnea, HTN, anxiety, depression, bipolar disorder, CVA, hiatal hernia, GERD, arthritis, fibromyalgia, anemia, CKD III, DM, neuropathy, and scoliosis.    PT Comments    Pt was pleasant and motivated to participate during the session but reported feeling fatigued this AM secondary to poor night's sleep.  Pt education and reinforcement provided on back precautions verbally and during functional tasks with good carryover. Pt demonstrated improved eccentric and concentric control and confidence during transfer training and was able to amb 125' with good stability.  Pt declined to attempt stair training this AM secondary to fatigue.  Pt will benefit from HHPT services upon discharge to safely address deficits listed in patient problem list for decreased caregiver assistance and eventual return to PLOF.     Follow Up Recommendations  Home health PT;Supervision for mobility/OOB     Equipment Recommendations  Rolling walker with 5" wheels;Hospital bed    Recommendations for Other Services       Precautions / Restrictions Precautions Precautions: Back Precaution Comments: No Bending, Arching, or Twisting Restrictions Other Position/Activity Restrictions: No back brace required; one drain and a wound vac in place    Mobility  Bed Mobility Overal bed mobility: Needs Assistance Bed Mobility: Rolling;Sit to Sidelying;Sidelying to Sit Rolling: Supervision Sidelying to sit: Min assist     Sit to sidelying: Min  assist General bed mobility comments: Min A for BLE and trunk control and min verbal cues for proper sequencing log roll training.  Transfers Overall transfer level: Needs assistance Equipment used: Rolling walker (2 wheeled) Transfers: Sit to/from Stand Sit to Stand: Min guard         General transfer comment: Min verbal cues for sequencing for back precaution compliance with pt able to stand on second attempt with improved confidence compared to prior session  Ambulation/Gait Ambulation/Gait assistance: Min guard Gait Distance (Feet): 125 Feet Assistive device: Rolling walker (2 wheeled) Gait Pattern/deviations: Step-through pattern;Decreased step length - right;Decreased step length - left Gait velocity: decreased   General Gait Details: Slow cadence but steady without LOB   Stairs Stairs:  (Pt declined to attempt secondary to fatigue)           Wheelchair Mobility    Modified Rankin (Stroke Patients Only)       Balance Overall balance assessment: Needs assistance   Sitting balance-Leahy Scale: Good     Standing balance support: Bilateral upper extremity supported;During functional activity Standing balance-Leahy Scale: Good                              Cognition Arousal/Alertness: Awake/alert Behavior During Therapy: WFL for tasks assessed/performed Overall Cognitive Status: Within Functional Limits for tasks assessed                                        Exercises Total Joint Exercises Ankle Circles/Pumps: AROM;Strengthening;Both;10 reps;5 reps Quad Sets: Strengthening;Both;10 reps;5 reps Gluteal Sets: Strengthening;Both;10 reps;5 reps  Heel Slides: Strengthening;Both;10 reps Long Arc Quad: Strengthening;Both;10 reps;15 reps Knee Flexion: Strengthening;Both;10 reps;15 reps Marching in Standing: AROM;Strengthening;Both;5 reps;Standing Other Exercises Other Exercises: General back precaution education and review provided  verbally and during functional activity including log roll training and transfer training    General Comments        Pertinent Vitals/Pain Pain Assessment: No/denies pain    Home Living                      Prior Function            PT Goals (current goals can now be found in the care plan section) Progress towards PT goals: Progressing toward goals    Frequency    BID      PT Plan Current plan remains appropriate    Co-evaluation              AM-PAC PT "6 Clicks" Mobility   Outcome Measure  Help needed turning from your back to your side while in a flat bed without using bedrails?: A Little Help needed moving from lying on your back to sitting on the side of a flat bed without using bedrails?: A Little Help needed moving to and from a bed to a chair (including a wheelchair)?: A Little Help needed standing up from a chair using your arms (e.g., wheelchair or bedside chair)?: A Little Help needed to walk in hospital room?: A Little Help needed climbing 3-5 steps with a railing? : A Little 6 Click Score: 18    End of Session Equipment Utilized During Treatment: Gait belt Activity Tolerance: Patient tolerated treatment well Patient left: in chair;with call bell/phone within reach;with chair alarm set;with SCD's reapplied Nurse Communication: Mobility status PT Visit Diagnosis: Muscle weakness (generalized) (M62.81);Difficulty in walking, not elsewhere classified (R26.2);History of falling (Z91.81)     Time: 3154-0086 PT Time Calculation (min) (ACUTE ONLY): 39 min  Charges:  $Gait Training: 8-22 mins $Therapeutic Exercise: 8-22 mins $Therapeutic Activity: 8-22 mins                     D. Scott Oliver Neuwirth PT, DPT 07/07/20, 10:51 AM

## 2020-07-07 NOTE — Progress Notes (Signed)
Pt had complained throughout the morning of burning with urination. She was noted to have a preoperative UTI and was treated with nitrofurantoin x 5 days prior to surgery. I obtained a UA, nitrate negative, discussed with hospitalist MD Pahwani- no indication for antibiotics at this time.

## 2020-07-07 NOTE — TOC Progression Note (Signed)
Transition of Care [facility]) - Progression Note    Patient Details  Name: Victoria Austin MRN: [id number] Date of Birth: 08-Jun-1951  Transition of Care [facility]) CM/SW [place], RN Phone Number: 07/07/2020, 3:24 PM  Clinical Narrative:   RNCM met with patient to discuss new recommendations for SNF. Patient reports that she understands these recommendations and is in agreement. She reports that she has been to SNF before but does not remember which one. She is agreeable to a search of all local facilities.  RNCM verified PASSR, completed FL-2 and started bed search.     Expected Discharge Plan: [place] Barriers to Discharge: No Barriers Identified  Expected Discharge Plan and Services Expected Discharge Plan: [place] arrangements for the past 2 months: Single Family Home                 DME Arranged: Walker rolling,Hospital bed DME Agency: [employer] Date DME Agency Contacted: 07/06/20 Time DME Agency Contacted: [phone] Representative spoke with at DME Agency: [place]: PT,OT [place] Date [place]: 07/06/20 Time [place]: 1443 Representative spoke with at [place]: [place] ([place]) Interventions    Readmission Risk Interventions Readmission Risk Prevention Plan 07/07/2020  Transportation Screening Complete  Medication Review Press photographer) Complete  PCP or Specialist appointment within 3-5 days of discharge Complete  HRI or [place] Complete  SW Recovery Care/Counseling Consult Complete  [place] Not Applicable  Some recent data might be hidden

## 2020-07-07 NOTE — Progress Notes (Signed)
PROGRESS NOTE    Victoria Austin  BBC:[id number] DOB: 08/12/1950 DOA: 07/05/2020 PCP: Donnamarie Rossetti, PA-C   Brief Narrative:  HPI: Victoria Austin is a 69 y.o. female with medical history significant for lumbar radiculopathy status post lumbar fusion in April 2021, type 2 diabetes mellitus, with most recent hemoglobin A1c noted to be 6.9% when checked in April 2021, hypertension, hyperlipidemia, obstructive sleep apnea noncompliant with home CPAP, who is admitted to [facility] on 07/05/2020 neurosurgery service for elective hardware removal relating to prior lumbar fusion surgery.   In the setting of history of lumbar radiculopathy the patient underwent underwent T11-L3 posterior fusion in April 2021.  Today, the patient has been admitted for elective associated hardware removal, which was performed earlier today in the setting of intraoperative overt complications.  Postoperative consulted the hospitalist service management of the patient's multiple chronic medical problems, which include type 2 diabetes mellitus, hypertension, hyperlipidemia.  The patient currently reports pain control.  She denies any recent chest pain, shortness of breath, palpitations, or diaphoresis.  Denies any recent nausea/vomiting.   While her home medication list currently basal insulin in the form of Lantus 80 units subcu nightly, the patient reports that she actually takes 50 units of Lantus on a nightly basis.  This is in addition to sliding scale NovoLog taken 3 times daily with meals.  Per chart review, it appears that most recent hemoglobin A1c was checked in April 2021, and found to be 6.9% at that time.  Her diabetes is complicated neuropathy, for which she takes gabapentin at home.  In addition to this insulin regimen, she is also on empagliflozin at home.  In the setting of a history of essential hypertension, outpatient antihypertensive regimen includes lisinopril as well as Norvasc.   She knowledges a history of hyperlipidemia, for which she is on gemfibrozil at home, and reports that she is not currently on as an outpatient.  She knowledges a history of obstructive sleep apnea, but reports poor compliance with use of nocturnal CPAP as an outpatient.  Assessment & Plan:   Principal Problem:   DM type 2 with diabetic peripheral neuropathy ([place]) Active Problems:   Dyslipidemia associated with type 2 diabetes mellitus (HCC)   Diabetic neuropathy (HCC)   Benign essential HTN   Lumbar radiculopathy   #) Type 2 diabetes mellitus: Complicated by polyperipheral neuropathy for which she is on gabapentin.  Outpatient insulin regimen, per the patient, consist of Lantus 50 units subcu nightly as well as sliding scale NovoLog 3 times daily with meals. She is also on Empagliflozin.  Despite of receiving 35 units of Lantus, she is still slightly hyperglycemic.  Will increase to 45 units and continue SSI.  #) Essential hypertension: On lisinopril as well as Norvasc at home which were discontinued yesterday due to significantly low blood pressure.  Blood pressure now within normal range so I will continue to hold them.  #) Hyperlipidemia: Continue gemfibrozil.  #) Obstructive sleep apnea: Poorly compliant with nocturnal CPAP as an outpatient.  #) Lumbar radiculopathy: Status post T11-L3 posterior fusion in April 2021, with continued pain.  Now  s/p PSF T11-L4.  Management per primary service/neurosurgery.  Hyperkalemia: Resolved.  DVT prophylaxis: enoxaparin (LOVENOX) injection 40 mg Start: 07/06/20 0800 SCD's Start: 07/05/20 1747 Place TED hose Start: 07/05/20 0918   Code Status: Full Code  Family Communication: None present at bedside.  Plan of care discussed with patient in length and he verbalized understanding and agreed with it.  Estimated body mass index is 32.14 kg/m as calculated from the following:   Height as of this encounter: 5\' 3"  (1.6 m).   Weight as of  this encounter: 82.3 kg.      Nutritional status:               Consultants:   Hospitalist  Procedures:   As above  Antimicrobials:  Anti-infectives (From admission, onward)   Start     Dose/Rate Route Frequency Ordered Stop   07/07/20 2200  trimethoprim (TRIMPEX) tablet 100 mg  Status:  Discontinued       Note to Pharmacy: Per Urologist for recurrent UTIs   100 mg Oral Daily at bedtime 07/07/20 1257 07/07/20 1303   07/07/20 2200  trimethoprim (TRIMPEX) tablet 100 mg       Note to Pharmacy: Recurrent UTIs   100 mg Oral Daily at bedtime 07/07/20 1303     07/05/20 1618  vancomycin (VANCOCIN) powder  Status:  Discontinued          As needed 07/05/20 1625 07/05/20 1739   07/05/20 1236  ceFAZolin (ANCEF) 2-4 GM/100ML-% IVPB       Note to Pharmacy: Norton Blizzard  : cabinet override      07/05/20 1236 07/05/20 1355   07/05/20 1145  ceFAZolin (ANCEF) IVPB 2g/100 mL premix        2 g 200 mL/hr over 30 Minutes Intravenous  Once 07/05/20 1142 07/05/20 1341   07/05/20 1145  vancomycin (VANCOREADY) IVPB 1250 mg/250 mL        1,250 mg 166.7 mL/hr over 90 Minutes Intravenous  Once 07/05/20 1142 07/06/20 0812         Subjective: Patient seen and examined.  She states that as far as her back pain goes, she is feeling much better but now she feels tired as she has not only 3 hours of sleep last night due to a lot of disturbances by the staff.  She also complains of some burning urination but she has not complained on and off.  Reportedly, she was diagnosed and treated for UTI preoperatively with 5 days of oral nitrofurantoin.  Objective: Vitals:   07/07/20 0307 07/07/20 0841 07/07/20 0924 07/07/20 1129  BP: 121/62 (!) 118/54  120/61  Pulse: 94 90  85  Resp: 16 16  16   Temp: 98.9 F (37.2 C) 99.1 F (37.3 C)  98.8 F (37.1 C)  TempSrc:      SpO2: 95% 97% 97% 97%  Weight:      Height:        Intake/Output Summary (Last 24 hours) at 07/07/2020 1324 Last data  filed at 07/07/2020 1046 Gross per 24 hour  Intake 1497.74 ml  Output 3160 ml  Net -1662.26 ml   Filed Weights   07/05/20 1200  Weight: 82.3 kg    Examination:  General exam: Appears calm and comfortable  Respiratory system: Clear to auscultation. Respiratory effort normal. Cardiovascular system: S1 & S2 heard, RRR. No JVD, murmurs, rubs, gallops or clicks. No pedal edema. Gastrointestinal system: Abdomen is nondistended, soft and nontender. No organomegaly or masses felt. Normal bowel sounds heard. Central nervous system: Alert and oriented. No focal neurological deficits. Extremities: Symmetric 5 x 5 power. Skin: No rashes, lesions or ulcers.  Psychiatry: Judgement and insight appear normal. Mood & affect appropriate.   Data Reviewed: I have personally reviewed following labs and imaging studies  CBC: Recent Labs  Lab 07/05/20 1212 07/05/20 1625 07/06/20 0523 07/07/20 0520 07/07/20 1234  WBC  --  12.6* 12.9* 7.2  --   HGB 11.6* 9.8* 8.7* 7.2* 7.4*  HCT 34.0* 30.5* 26.8* 21.5* 22.8*  MCV  --  93.6 94.7 93.1  --   PLT  --  307 270 183  --    Basic Metabolic Panel: Recent Labs  Lab 07/05/20 1212 07/06/20 0523 07/07/20 0520  NA 142 134* 134*  K 4.9 5.2* 4.4  CL 106 103 103  CO2  --  23 22  GLUCOSE 166* 179* 178*  BUN 31* 32* 21  CREATININE 1.00 0.98 0.84  CALCIUM  --  7.8* 7.5*   GFR: Estimated Creatinine Clearance: 65.2 mL/min (by C-G formula based on SCr of 0.84 mg/dL). Liver Function Tests: No results for input(s): AST, ALT, ALKPHOS, BILITOT, PROT, ALBUMIN in the last 168 hours. No results for input(s): LIPASE, AMYLASE in the last 168 hours. No results for input(s): AMMONIA in the last 168 hours. Coagulation Profile: No results for input(s): INR, PROTIME in the last 168 hours. Cardiac Enzymes: No results for input(s): CKTOTAL, CKMB, CKMBINDEX, TROPONINI in the last 168 hours. BNP (last 3 results) No results for input(s): PROBNP in the last 8760  hours. HbA1C: No results for input(s): HGBA1C in the last 72 hours. CBG: Recent Labs  Lab 07/06/20 1200 07/06/20 1633 07/06/20 2113 07/07/20 0755 07/07/20 1135  GLUCAP 222* 214* 164* 186* 221*   Lipid Profile: No results for input(s): CHOL, HDL, LDLCALC, TRIG, CHOLHDL, LDLDIRECT in the last 72 hours. Thyroid Function Tests: No results for input(s): TSH, T4TOTAL, FREET4, T3FREE, THYROIDAB in the last 72 hours. Anemia Panel: No results for input(s): VITAMINB12, FOLATE, FERRITIN, TIBC, IRON, RETICCTPCT in the last 72 hours. Sepsis Labs: No results for input(s): PROCALCITON, LATICACIDVEN in the last 168 hours.  Recent Results (from the past 240 hour(s))  Surgical pcr screen     Status: None   Collection Time: 06/28/20 10:20 AM   Specimen: Nasal Mucosa; Nasal Swab  Result Value Ref Range Status   MRSA, PCR NEGATIVE NEGATIVE Final   Staphylococcus aureus NEGATIVE NEGATIVE Final    Comment: (NOTE) The Xpert SA Assay (FDA approved for NASAL specimens in patients 40 years of age and older), is one component of a comprehensive surveillance program. It is not intended to diagnose infection nor to guide or monitor treatment. Performed at [facility], [address] [place], [place] [address]   Urine culture     Status: Abnormal   Collection Time: 06/28/20 10:54 AM   Specimen: Urine, Random  Result Value Ref Range Status   Specimen Description   Final    URINE, RANDOM Performed at [facility], [address] [place], [place] [address]    Special Requests   Final    NONE Performed at [facility], [address] [place],  [address]    Culture >=100,000 COLONIES/mL ESCHERICHIA COLI (A)  Final   Report Status 07/01/2020 FINAL  Final   Organism ID, Bacteria ESCHERICHIA COLI (A)  Final      Susceptibility   Escherichia coli - MIC*    AMPICILLIN >=32 RESISTANT Resistant     CEFAZOLIN <=4 SENSITIVE Sensitive     CEFEPIME <=0.12 SENSITIVE  Sensitive     CEFTRIAXONE <=0.25 SENSITIVE Sensitive     CIPROFLOXACIN <=0.25 SENSITIVE Sensitive     GENTAMICIN <=1 SENSITIVE Sensitive     IMIPENEM <=0.25 SENSITIVE Sensitive     NITROFURANTOIN <=16 SENSITIVE Sensitive     TRIMETH/SULFA >=320 RESISTANT Resistant  AMPICILLIN/SULBACTAM >=32 RESISTANT Resistant     PIP/TAZO <=4 SENSITIVE Sensitive     * >=100,000 COLONIES/mL ESCHERICHIA COLI  SARS CORONAVIRUS 2 (TAT 6-24 HRS) Nasopharyngeal Nasopharyngeal Swab     Status: None   Collection Time: 07/03/20  9:35 AM   Specimen: Nasopharyngeal Swab  Result Value Ref Range Status   SARS Coronavirus 2 NEGATIVE NEGATIVE Final    Comment: (NOTE) SARS-CoV-2 target nucleic acids are NOT DETECTED.  The SARS-CoV-2 RNA is generally detectable in upper and lower respiratory specimens during the acute phase of infection. Negative results do not preclude SARS-CoV-2 infection, do not rule out co-infections with other pathogens, and should not be used as the sole basis for treatment or other patient management decisions. Negative results must be combined with clinical observations, patient history, and epidemiological information. The expected result is Negative.  Fact Sheet for Patients: SugarRoll.be  Fact Sheet for Healthcare Providers: https://www.woods-mathews.com/  This test is not yet approved or cleared by the [place] FDA and  has been authorized for detection and/or diagnosis of SARS-CoV-2 by FDA under an Emergency Use Authorization (EUA). This EUA will remain  in effect (meaning this test can be used) for the duration of the COVID-19 declaration under Se ction 564(b)(1) of the Act, 21 U.S.C. section 360bbb-3(b)(1), unless the authorization is terminated or revoked sooner.  Performed at [facility], [place] [address] [place],  [address]       Radiology Studies: DG Lumbar Spine 2-3 Views  Result Date:  07/06/2020 CLINICAL DATA:  69 year old female with a history of thoracolumbar fusion. Hardware revision, extension of fusion caudally to the L4 level yesterday. EXAM: LUMBAR SPINE - 2-3 VIEW COMPARISON:  Intraoperative images 12821 and earlier. FINDINGS: Normal lumbar segmentation. Previous T11 through L3 posterior fusion hardware. New pedicle screws placed at L4. New addition of bone cement about the T11, T12, L3, and new L4 pedicle screws. Hardware appears intact. Stable vertebral height and alignment. Chronic L1 compression. No new osseous abnormality identified. Stable cholecystectomy clips. Visible bowel-gas pattern within normal limits. Calcified aortic atherosclerosis. IMPRESSION: 1. Revision of thoracolumbar fusion and addition of pedicle screws at L4. Addition of bone cement at some levels. 2. No new osseous abnormality. Electronically Signed   By: Genevie Ann M.D.   On: 07/06/2020 16:03   DG Lumbar Spine 2-3 Views  Result Date: 07/05/2020 CLINICAL DATA:  Surgery. EXAM: LUMBAR SPINE - 2-3 VIEW; DG C-ARM 1-60 MIN COMPARISON:  Radiographs November 25 21. FINDINGS: Fluoro time: 6 seconds. Seven C-arm fluoroscopic images were obtained intraoperatively and submitted for post operative interpretation. These images demonstrate posterior rod and screw fixation spanning T11 to L4. Please see the performing provider's procedural report for further detail. IMPRESSION: Intraoperative fluoroscopic images, as detailed above Electronically Signed   By: Margaretha Sheffield MD   On: 07/05/2020 16:44   DG C-Arm 1-60 Min  Result Date: 07/05/2020 CLINICAL DATA:  Surgery. EXAM: LUMBAR SPINE - 2-3 VIEW; DG C-ARM 1-60 MIN COMPARISON:  Radiographs November 25 21. FINDINGS: Fluoro time: 6 seconds. Seven C-arm fluoroscopic images were obtained intraoperatively and submitted for post operative interpretation. These images demonstrate posterior rod and screw fixation spanning T11 to L4. Please see the performing provider's  procedural report for further detail. IMPRESSION: Intraoperative fluoroscopic images, as detailed above Electronically Signed   By: Margaretha Sheffield MD   On: 07/05/2020 16:44    Scheduled Meds: . acetaminophen  1,000 mg Oral Q6H  . albuterol  2.5 mg Nebulization BID  .  carbamazepine  400 mg Oral TID  . Chlorhexidine Gluconate Cloth  6 each Topical Daily  . docusate sodium  100 mg Oral BID  . DULoxetine  60 mg Oral Daily  . DULoxetine  90 mg Oral QHS  . enoxaparin (LOVENOX) injection  0.5 mg/kg Subcutaneous Q24H  . gabapentin  300 mg Oral QHS  . gemfibrozil  600 mg Oral BID  . insulin aspart  0-15 Units Subcutaneous TID WC  . insulin glargine  45 Units Subcutaneous QHS  . loratadine  10 mg Oral Daily  . magnesium hydroxide  30 mL Oral BID  . methocarbamol  750 mg Oral Q6H  . mirabegron ER  50 mg Oral QHS  . mometasone-formoterol  2 puff Inhalation BID  . pantoprazole  40 mg Oral Daily  . primidone  250 mg Oral QHS  . propranolol  10 mg Oral BID  . rosuvastatin  20 mg Oral Daily  . senna  2 tablet Oral BID  . sodium chloride flush  3 mL Intravenous Q12H  . trimethoprim  100 mg Oral QHS   Continuous Infusions: . sodium chloride 50 mL/hr (07/06/20 0826)  . methocarbamol (ROBAXIN) IV 500 mg (07/06/20 1819)     LOS: 2 days   Time spent: 66 min   Darliss Cheney, MD [facility]  07/07/2020, 1:24 PM   To contact the attending provider between 7A-7P or the covering provider during after hours 7P-7A, please log into the web site [email or web address].

## 2020-07-08 LAB — BASIC METABOLIC PANEL
Anion gap: 7 (ref 5–15)
BUN: 17 mg/dL (ref 8–23)
CO2: 25 mmol/L (ref 22–32)
Calcium: 8 mg/dL — ABNORMAL LOW (ref 8.9–10.3)
Chloride: 102 mmol/L (ref 98–111)
Creatinine, Ser: 0.88 mg/dL (ref 0.44–1.00)
GFR, Estimated: >60 mL/min (ref >60)
Glucose, Bld: 225 mg/dL — ABNORMAL HIGH (ref 70–99)
Potassium: 4.7 mmol/L (ref 3.5–5.1)
Sodium: 134 mmol/L — ABNORMAL LOW (ref 135–145)

## 2020-07-08 LAB — GLUCOSE, CAPILLARY
Glucose-Capillary: 137 mg/dL — ABNORMAL HIGH (ref 70–99)
Glucose-Capillary: 144 mg/dL — ABNORMAL HIGH (ref 70–99)
Glucose-Capillary: 160 mg/dL — ABNORMAL HIGH (ref 70–99)
Glucose-Capillary: 184 mg/dL — ABNORMAL HIGH (ref 70–99)

## 2020-07-08 LAB — CBC
HCT: 21.4 % — ABNORMAL LOW (ref 36.0–46.0)
HCT: 21.9 % — ABNORMAL LOW (ref 36.0–46.0)
Hemoglobin: 7.1 g/dL — ABNORMAL LOW (ref 12.0–15.0)
Hemoglobin: 7.2 g/dL — ABNORMAL LOW (ref 12.0–15.0)
MCH: 30.6 pg (ref 26.0–34.0)
MCH: 30.6 pg (ref 26.0–34.0)
MCHC: 33.2 g/dL (ref 30.0–36.0)
MCHC: 32.9 g/dL (ref 30.0–36.0)
MCV: 92.2 fL (ref 80.0–100.0)
MCV: 93.2 fL (ref 80.0–100.0)
Platelets: 189 10*3/uL (ref 150–400)
Platelets: 219 10*3/uL (ref 150–400)
RBC: 2.32 MIL/uL — ABNORMAL LOW (ref 3.87–5.11)
RBC: 2.35 MIL/uL — ABNORMAL LOW (ref 3.87–5.11)
RDW: 13.3 % (ref 11.5–15.5)
RDW: 13.3 % (ref 11.5–15.5)
WBC: 7.2 10*3/uL (ref 4.0–10.5)
WBC: 7.9 10*3/uL (ref 4.0–10.5)
nRBC: 0 % (ref 0.0–0.2)
nRBC: 0 % (ref 0.0–0.2)

## 2020-07-08 LAB — URINE CULTURE: Culture: NO GROWTH

## 2020-07-08 NOTE — Progress Notes (Signed)
Physical Therapy Treatment Patient Details Name: Victoria Austin MRN: [id number] DOB: Dec 05, 1950 Today's Date: 07/08/2020    History of Present Illness Pt is a 69 yo female who who presented with prior L1 compression fracture with pseudoarthrosis after prior spinal fusion in April of 2021.  Pt is now s/p posterior spinal fusion T11-L4, posterolateral arthrodesis T11 to L4, posterior segmental instrumentation T11 to L4 using Depuy Expedium, and bone marrow aspirate.  PMH includes: sleep apnea, HTN, anxiety, depression, bipolar disorder, CVA, hiatal hernia, GERD, arthritis, fibromyalgia, anemia, CKD III, DM, neuropathy, and scoliosis.    PT Comments    Pt was sitting EOB upon arriving. Very excited for PT session and reporting feeling much better today than previous few days. " I slept good so I feel good." She was easily able to stand from EOB and ambulate. Distances limited by lunch arriving. Will return later this date to progress gait and perform stair training. Pt does not want to go to rehab. Will benefit from continued skilled PT at [place].     Follow Up Recommendations  Home health PT;Supervision - Intermittent     Equipment Recommendations  Rolling walker with 5" wheels    Recommendations for Other Services       Precautions / Restrictions Precautions Precautions: Back Precaution Comments: No Bending,lifting, Arching, or Twisting Restrictions Weight Bearing Restrictions: No    Mobility  Bed Mobility               General bed mobility comments: Pt was sitting EOB upon arriving  Transfers Overall transfer level: Needs assistance Equipment used: Rolling walker (2 wheeled) Transfers: Sit to/from Stand Sit to Stand: Min guard         General transfer comment: CGA for safety however no physical assistance required.  Ambulation/Gait Ambulation/Gait assistance: Supervision Gait Distance (Feet): 5 Feet Assistive device: Rolling walker (2 wheeled) Gait  Pattern/deviations: WFL(Within Functional Limits) Gait velocity: decreased   General Gait Details: Pt was easily able to take steps from EOB to recliner. distances limited by lunch tray arriving. will return later this afternoon to advance gait and stair navigation.      Balance Overall balance assessment: Modified Independent       Cognition Arousal/Alertness: Awake/alert Behavior During Therapy: WFL for tasks assessed/performed Overall Cognitive Status: Within Functional Limits for tasks assessed    General Comments: Pt is A and O x 4 and agreeable to PT session             Pertinent Vitals/Pain Pain Assessment: No/denies pain Pain Score: 0-No pain           PT Goals (current goals can now be found in the care plan section) Acute Rehab PT Goals Patient Stated Goal: to move better at home Progress towards PT goals: Progressing toward goals    Frequency    BID      PT Plan Discharge plan needs to be updated       AM-PAC PT "6 Clicks" Mobility   Outcome Measure  Help needed turning from your back to your side while in a flat bed without using bedrails?: A Little Help needed moving from lying on your back to sitting on the side of a flat bed without using bedrails?: A Little Help needed moving to and from a bed to a chair (including a wheelchair)?: A Little Help needed standing up from a chair using your arms (e.g., wheelchair or bedside chair)?: A Little Help needed to walk in hospital room?: A Little Help  needed climbing 3-5 steps with a railing? : A Little 6 Click Score: 18    End of Session   Activity Tolerance: Patient tolerated treatment well Patient left: in chair;with call bell/phone within reach;with chair alarm set;with SCD's reapplied Nurse Communication: Mobility status PT Visit Diagnosis: Muscle weakness (generalized) (M62.81);Difficulty in walking, not elsewhere classified (R26.2);History of falling (Z91.81)     Time: 1215-1223 PT Time  Calculation (min) (ACUTE ONLY): 8 min  Charges:  $Therapeutic Activity: 8-22 mins                     Julaine Fusi PTA 07/08/20, 1:26 PM

## 2020-07-08 NOTE — TOC Progression Note (Signed)
Transition of Care [facility]) - Progression Note    Patient Details  Name: Victoria Austin MRN: [id number] Date of Birth: 07-07-1951  [facility] CM/SW Contact  Izola Price, RN Phone Number: 07/08/2020, 2:09 PM  Clinical Narrative:    12/11: 8682  S/P spinal fusion 07/05/20 via neurosurgery.   Initial PT recommendation was SNF and search initiated by SW 12/10.   Today PT rec. is HH PT with RW.   Previously on 12/9 [facility] and [facility] were contacted.   Will continue to monitor for discharge needs/destination.  Simmie Davies RN CM    Expected Discharge Plan: [place] Barriers to Discharge: Continued Medical Work up  Expected Discharge Plan and Services Expected Discharge Plan: [place] arrangements for the past 2 months: Single Family Home                 DME Arranged: Walker rolling,Hospital bed DME Agency: [facility] Date DME Agency Contacted: 07/06/20 Time DME Agency Contacted: [phone] Representative spoke with at DME Agency: [place]: PT,OT [place] Date [place]: 07/06/20 Time [place]: 3552 Representative spoke with at [place]: [place] ([place]) Interventions    Readmission Risk Interventions Readmission Risk Prevention Plan 07/07/2020  Transportation Screening Complete  Medication Review Press photographer) Complete  PCP or Specialist appointment within 3-5 days of discharge Complete  HRI or [place] Complete  SW Recovery Care/Counseling Consult Complete  [place] Not Applicable  Some recent data might be hidden

## 2020-07-08 NOTE — Progress Notes (Addendum)
PROGRESS NOTE    [name]  CBS:[id number] DOB: [birth year] DOA: 07/05/2020 PCP: Donnamarie Rossetti, PA-C   Brief Narrative:  HPI: Victoria Austin is a 69 y.o. female with medical history significant for lumbar radiculopathy status post lumbar fusion in April 2021, type 2 diabetes mellitus, with most recent hemoglobin A1c noted to be 6.9% when checked in April 2021, hypertension, hyperlipidemia, obstructive sleep apnea noncompliant with home CPAP, who is admitted to [facility] on 07/05/2020 neurosurgery service for elective hardware removal relating to prior lumbar fusion surgery.   In the setting of history of lumbar radiculopathy the patient underwent underwent T11-L3 posterior fusion in April 2021.  Today, the patient has been admitted for elective associated hardware removal, which was performed earlier today in the setting of intraoperative overt complications.  Postoperative consulted the hospitalist service management of the patient's multiple chronic medical problems, which include type 2 diabetes mellitus, hypertension, hyperlipidemia.  The patient currently reports pain control.  She denies any recent chest pain, shortness of breath, palpitations, or diaphoresis.  Denies any recent nausea/vomiting.   While her home medication list currently basal insulin in the form of Lantus 80 units subcu nightly, the patient reports that she actually takes 50 units of Lantus on a nightly basis.  This is in addition to sliding scale NovoLog taken 3 times daily with meals.  Per chart review, it appears that most recent hemoglobin A1c was checked in April 2021, and found to be 6.9% at that time.  Her diabetes is complicated neuropathy, for which she takes gabapentin at home.  In addition to this insulin regimen, she is also on empagliflozin at home.  In the setting of a history of essential hypertension, outpatient antihypertensive regimen includes lisinopril as well as Norvasc.   She knowledges a history of hyperlipidemia, for which she is on gemfibrozil at home, and reports that she is not currently on as an outpatient.  She knowledges a history of obstructive sleep apnea, but reports poor compliance with use of nocturnal CPAP as an outpatient.  Assessment & Plan:   Principal Problem:   DM type 2 with diabetic peripheral neuropathy ([place]) Active Problems:   Dyslipidemia associated with type 2 diabetes mellitus (HCC)   Diabetic neuropathy (HCC)   Benign essential HTN   Lumbar radiculopathy   #) Type 2 diabetes mellitus: Complicated by polyperipheral neuropathy for which she is on gabapentin.  Outpatient insulin regimen, per the patient, consist of Lantus 50 units subcu nightly as well as sliding scale NovoLog 3 times daily with meals. She is also on Empagliflozin.  Blood sugar control.  Continue Lantus 45 units and SSI.  #) Essential hypertension: On lisinopril as well as Norvasc at home which were discontinued yesterday due to significantly low blood pressure.  Blood pressure now within normal range so I will continue to hold them.  #) Hyperlipidemia: Continue gemfibrozil.  #) Obstructive sleep apnea: Poorly compliant with nocturnal CPAP as an outpatient.  #) Lumbar radiculopathy: Status post T11-L3 posterior fusion in April 2021, with continued pain.  Now  s/p PSF T11-L4.  Seen by PT.  They recommend SNF however patient thinks that she is a stronger and wants to go home.  Will defer management and discharge plan per primary service/neurosurgery.  Asymptomatic bacteriuria: Patient symptoms are nonspecific and her UA is not highly indicative of UTI.  She was recently treated for UTI with 5 days of nitrofurantoin preoperatively as well.  Will watch off antibiotics.  Acute/postoperative blood loss  anemia: Hemoglobin dropped to 7.1.  Will recheck at 8 PM.  Transfuse if less than 7.  Hyperkalemia: Resolved.  DVT prophylaxis: enoxaparin (LOVENOX) injection 40 mg  Start: 07/06/20 0800 SCD's Start: 07/05/20 1747 Place TED hose Start: 07/05/20 0918   Code Status: Full Code  Family Communication: None present at bedside.  Plan of care discussed with patient in length and he verbalized understanding and agreed with it.     Estimated body mass index is 32.14 kg/m as calculated from the following:   Height as of this encounter: 5\' 3"  (1.6 m).   Weight as of this encounter: 82.3 kg.      Nutritional status:               Consultants:   Hospitalist  Procedures:   As above  Antimicrobials:  Anti-infectives (From admission, onward)   Start     Dose/Rate Route Frequency Ordered Stop   07/07/20 2200  trimethoprim (TRIMPEX) tablet 100 mg  Status:  Discontinued       Note to Pharmacy: Per Urologist for recurrent UTIs   100 mg Oral Daily at bedtime 07/07/20 1257 07/07/20 1303   07/07/20 2200  trimethoprim (TRIMPEX) tablet 100 mg       Note to Pharmacy: Recurrent UTIs   100 mg Oral Daily at bedtime 07/07/20 1303     07/05/20 1618  vancomycin (VANCOCIN) powder  Status:  Discontinued          As needed 07/05/20 1625 07/05/20 1739   07/05/20 1236  ceFAZolin (ANCEF) 2-4 GM/100ML-% IVPB       Note to Pharmacy: Norton Blizzard  : cabinet override      07/05/20 1236 07/05/20 1355   07/05/20 1145  ceFAZolin (ANCEF) IVPB 2g/100 mL premix        2 g 200 mL/hr over 30 Minutes Intravenous  Once 07/05/20 1142 07/05/20 1341   07/05/20 1145  vancomycin (VANCOREADY) IVPB 1250 mg/250 mL        1,250 mg 166.7 mL/hr over 90 Minutes Intravenous  Once 07/05/20 1142 07/06/20 0812         Subjective: Patient seen and examined.  Much brighter and alert today.  Some back pain but she says that she is feeling much better than yesterday.  She had no other complaint.  Objective: Vitals:   07/08/20 0046 07/08/20 0452 07/08/20 0807 07/08/20 0838  BP: 108/61 (!) 106/41 113/79   Pulse: 81 73 76   Resp: 16 17 18    Temp: 98.2 F (36.8 C) 98.2 F (36.8  C) 98 F (36.7 C)   TempSrc:   Oral   SpO2: 97% 100% 100% 98%  Weight:      Height:        Intake/Output Summary (Last 24 hours) at 07/08/2020 1103 Last data filed at 07/08/2020 1028 Gross per 24 hour  Intake 243 ml  Output 1950 ml  Net -1707 ml   Filed Weights   07/05/20 1200  Weight: 82.3 kg    Examination:  General exam: Appears calm and comfortable  Respiratory system: Clear to auscultation. Respiratory effort normal. Cardiovascular system: S1 & S2 heard, RRR. No JVD, murmurs, rubs, gallops or clicks. No pedal edema. Gastrointestinal system: Abdomen is nondistended, soft and nontender. No organomegaly or masses felt. Normal bowel sounds heard. Central nervous system: Alert and oriented. No focal neurological deficits. Extremities: Symmetric 5 x 5 power. Skin: No rashes, lesions or ulcers.  Psychiatry: Judgement and insight appear normal. Mood & affect appropriate.  Data Reviewed: I have personally reviewed following labs and imaging studies  CBC: Recent Labs  Lab 07/05/20 1625 07/06/20 0523 07/07/20 0520 07/07/20 1234 07/08/20 0928  WBC 12.6* 12.9* 7.2  --  7.2  HGB 9.8* 8.7* 7.2* 7.4* 7.1*  HCT 30.5* 26.8* 21.5* 22.8* 21.4*  MCV 93.6 94.7 93.1  --  92.2  PLT 307 270 183  --  563   Basic Metabolic Panel: Recent Labs  Lab 07/05/20 1212 07/06/20 0523 07/07/20 0520 07/08/20 0928  NA 142 134* 134* 134*  K 4.9 5.2* 4.4 4.7  CL 106 103 103 102  CO2  --  23 22 25   GLUCOSE 166* 179* 178* 225*  BUN 31* 32* 21 17  CREATININE 1.00 0.98 0.84 0.88  CALCIUM  --  7.8* 7.5* 8.0*   GFR: Estimated Creatinine Clearance: 62.2 mL/min (by C-G formula based on SCr of 0.88 mg/dL). Liver Function Tests: No results for input(s): AST, ALT, ALKPHOS, BILITOT, PROT, ALBUMIN in the last 168 hours. No results for input(s): LIPASE, AMYLASE in the last 168 hours. No results for input(s): AMMONIA in the last 168 hours. Coagulation Profile: No results for input(s): INR,  PROTIME in the last 168 hours. Cardiac Enzymes: No results for input(s): CKTOTAL, CKMB, CKMBINDEX, TROPONINI in the last 168 hours. BNP (last 3 results) No results for input(s): PROBNP in the last 8760 hours. HbA1C: No results for input(s): HGBA1C in the last 72 hours. CBG: Recent Labs  Lab 07/07/20 0755 07/07/20 1135 07/07/20 1632 07/07/20 2153 07/08/20 0806  GLUCAP 186* 221* 117* 227* 137*   Lipid Profile: No results for input(s): CHOL, HDL, LDLCALC, TRIG, CHOLHDL, LDLDIRECT in the last 72 hours. Thyroid Function Tests: No results for input(s): TSH, T4TOTAL, FREET4, T3FREE, THYROIDAB in the last 72 hours. Anemia Panel: No results for input(s): VITAMINB12, FOLATE, FERRITIN, TIBC, IRON, RETICCTPCT in the last 72 hours. Sepsis Labs: No results for input(s): PROCALCITON, LATICACIDVEN in the last 168 hours.  Recent Results (from the past 240 hour(s))  SARS CORONAVIRUS 2 (TAT 6-24 HRS) Nasopharyngeal Nasopharyngeal Swab     Status: None   Collection Time: 07/03/20  9:35 AM   Specimen: Nasopharyngeal Swab  Result Value Ref Range Status   SARS Coronavirus 2 NEGATIVE NEGATIVE Final    Comment: (NOTE) SARS-CoV-2 target nucleic acids are NOT DETECTED.  The SARS-CoV-2 RNA is generally detectable in upper and lower respiratory specimens during the acute phase of infection. Negative results do not preclude SARS-CoV-2 infection, do not rule out co-infections with other pathogens, and should not be used as the sole basis for treatment or other patient management decisions. Negative results must be combined with clinical observations, patient history, and epidemiological information. The expected result is Negative.  Fact Sheet for Patients: SugarRoll.be  Fact Sheet for Healthcare Providers: https://www.woods-mathews.com/  This test is not yet approved or cleared by the [place] FDA and  has been authorized for detection and/or  diagnosis of SARS-CoV-2 by FDA under an Emergency Use Authorization (EUA). This EUA will remain  in effect (meaning this test can be used) for the duration of the COVID-19 declaration under Se ction 564(b)(1) of the Act, 21 U.S.C. section 360bbb-3(b)(1), unless the authorization is terminated or revoked sooner.  Performed at [facility], [place] [address], [place], [place] [address]       Radiology Studies: DG Lumbar Spine 2-3 Views  Result Date: 07/06/2020 CLINICAL DATA:  69 year old female with a history of thoracolumbar fusion. Hardware revision, extension of fusion caudally  to the L4 level yesterday. EXAM: LUMBAR SPINE - 2-3 VIEW COMPARISON:  Intraoperative images [id number] and earlier. FINDINGS: Normal lumbar segmentation. Previous T11 through L3 posterior fusion hardware. New pedicle screws placed at L4. New addition of bone cement about the T11, T12, L3, and new L4 pedicle screws. Hardware appears intact. Stable vertebral height and alignment. Chronic L1 compression. No new osseous abnormality identified. Stable cholecystectomy clips. Visible bowel-gas pattern within normal limits. Calcified aortic atherosclerosis. IMPRESSION: 1. Revision of thoracolumbar fusion and addition of pedicle screws at L4. Addition of bone cement at some levels. 2. No new osseous abnormality. Electronically Signed   By: Genevie Ann M.D.   On: 07/06/2020 16:03    Scheduled Meds: . acetaminophen  1,000 mg Oral Q6H  . albuterol  2.5 mg Nebulization BID  . carbamazepine  400 mg Oral TID  . Chlorhexidine Gluconate Cloth  6 each Topical Daily  . docusate sodium  100 mg Oral BID  . DULoxetine  90 mg Oral Daily  . enoxaparin (LOVENOX) injection  0.5 mg/kg Subcutaneous Q24H  . gabapentin  300 mg Oral QHS  . gemfibrozil  600 mg Oral BID  . insulin aspart  0-15 Units Subcutaneous TID WC  . insulin glargine  45 Units Subcutaneous QHS  . loratadine  10 mg Oral Daily  . magnesium hydroxide  30 mL Oral BID  .  methocarbamol  750 mg Oral Q6H  . mirabegron ER  50 mg Oral QHS  . mometasone-formoterol  2 puff Inhalation BID  . pantoprazole  40 mg Oral Daily  . primidone  250 mg Oral QHS  . propranolol  10 mg Oral BID  . rosuvastatin  20 mg Oral Daily  . senna  2 tablet Oral BID  . sodium chloride flush  3 mL Intravenous Q12H  . trimethoprim  100 mg Oral QHS   Continuous Infusions: . sodium chloride 50 mL/hr (07/06/20 0826)  . methocarbamol (ROBAXIN) IV 500 mg (07/06/20 1819)     LOS: 3 days   Time spent: 26 min   Darliss Cheney, MD [facility]  07/08/2020, 11:03 AM   To contact the attending provider between 7A-7P or the covering provider during after hours 7P-7A, please log into the web site [email or web address].

## 2020-07-08 NOTE — Progress Notes (Signed)
Physical Therapy Treatment Patient Details Name: Victoria Austin MRN: [id number] DOB: 24-Nov-1950 Today's Date: 07/08/2020    History of Present Illness Pt is a 69 yo female who who presented with prior L1 compression fracture with pseudoarthrosis after prior spinal fusion in April of 2021.  Pt is now s/p posterior spinal fusion T11-L4, posterolateral arthrodesis T11 to L4, posterior segmental instrumentation T11 to L4 using Depuy Expedium, and bone marrow aspirate.  PMH includes: sleep apnea, HTN, anxiety, depression, bipolar disorder, CVA, hiatal hernia, GERD, arthritis, fibromyalgia, anemia, CKD III, DM, neuropathy, and scoliosis.    PT Comments    Pt was long sititng in bed eager for therapist to return and perform stair training. Pt was able to exit bed without physical assistance. Stand to RW and ambulate >300 ft. Pt demonstrated safe ability to ascend/descend stairs without safety concern. Performed stairs 2 x. Pt wanted to perform her way then was demonstrated proper way. She was able to perform proper way safely going up backwards using RW without rails. Overall pt is progressing well. She demonstrates safe functional mobility throughout session. Recommend DC to home with [facility] services to follow.       Follow Up Recommendations  Home health PT;Supervision - Intermittent     Equipment Recommendations  Rolling walker with 5" wheels    Recommendations for Other Services       Precautions / Restrictions Precautions Precautions: Back Precaution Comments: No Bending,lifting, Arching, or Twisting Restrictions Weight Bearing Restrictions: No    Mobility  Bed Mobility Overal bed mobility: Needs Assistance Bed Mobility: Rolling;Sidelying to Sit (pt reports MD/surgeon told her she did not have to perform log roll. Unable to clarify with MD.) Rolling: Supervision Sidelying to sit: Supervision     Sit to sidelying: Supervision General bed mobility comments: Pt was sitting EOB upon  arriving  Transfers Overall transfer level: Modified independent Equipment used: Rolling walker (2 wheeled) Transfers: Sit to/from Stand Sit to Stand: Min guard         General transfer comment: no physical assistance required  Ambulation/Gait Ambulation/Gait assistance: Supervision Gait Distance (Feet): 350 Feet Assistive device: Rolling walker (2 wheeled) Gait Pattern/deviations: WFL(Within Functional Limits) Gait velocity: decreased   General Gait Details: no LOB or difficulty with ambulation       Balance Overall balance assessment: Modified Independent       Cognition Arousal/Alertness: Awake/alert Behavior During Therapy: WFL for tasks assessed/performed Overall Cognitive Status: Within Functional Limits for tasks assessed    General Comments: Pt is A and O x 4 and agreeable to PT session             Pertinent Vitals/Pain Pain Assessment: No/denies pain Pain Score: 0-No pain           PT Goals (current goals can now be found in the care plan section) Acute Rehab PT Goals Patient Stated Goal: to move better at home Progress towards PT goals: Progressing toward goals    Frequency    7X/week      PT Plan Frequency needs to be updated;Current plan remains appropriate (decrease frequency to QD due to major advancements in progress)    Co-evaluation              AM-PAC PT "6 Clicks" Mobility   Outcome Measure  Help needed turning from your back to your side while in a flat bed without using bedrails?: None Help needed moving from lying on your back to sitting on the side of a flat bed without  using bedrails?: None Help needed moving to and from a bed to a chair (including a wheelchair)?: None Help needed standing up from a chair using your arms (e.g., wheelchair or bedside chair)?: A Little Help needed to walk in hospital room?: A Little Help needed climbing 3-5 steps with a railing? : A Little 6 Click Score: 21    End of Session    Activity Tolerance: Patient tolerated treatment well Patient left: with call bell/phone within reach;with SCD's reapplied;in bed;with bed alarm set Nurse Communication: Mobility status PT Visit Diagnosis: Muscle weakness (generalized) (M62.81);Difficulty in walking, not elsewhere classified (R26.2);History of falling (Z91.81)     Time: 6967-8938 PT Time Calculation (min) (ACUTE ONLY): 20 min  Charges:  $Gait Training: 8-22 mins $Therapeutic Activity: 8-22 mins                     Julaine Fusi PTA 07/08/20, 3:35 PM

## 2020-07-08 NOTE — Progress Notes (Signed)
Procedure: PSF T11-L4 Procedure date: 07/05/2020 Diagnosis: pseudoarthrosis    History: Victoria Austin is s/p PSF T11-L4 POD3: Victoria Austin is doing very well.  Her pain is well-controlled and she is feeling stronger.  POD2: Victoria Austin continues to deny pain but states that she did not sleep well.  POD1: Victoria Austin reports that she feels quite well this morning. Slept well, denies pain. Medicine is following.  POD0: Tolerated procedure well. Evaluated in post op recovery still disoriented from anesthesia but able to answer questions and obey commands.   Physical Exam: Vitals:   07/08/20 0807 07/08/20 0838  BP: 113/79   Pulse: 76   Resp: 18   Temp: 98 F (36.7 C)   SpO2: 100% 98%    General: Alert and oriented, lying in bed Strength:5/5 throughout  Sensation: intact and symmetric throughout  Skin: Prevena incisional vac and 2 drains in place  Data:  Recent Labs  Lab 07/06/20 0523 07/07/20 0520  NA 134* 134*  K 5.2* 4.4  CL 103 103  CO2 23 22  BUN 32* 21  CREATININE 0.98 0.84  GLUCOSE 179* 178*  CALCIUM 7.8* 7.5*   No results for input(s): AST, ALT, ALKPHOS in the last 168 hours.  Invalid input(s): TBILI   Recent Labs  Lab 07/06/20 0523 07/07/20 0520 07/07/20 1234 07/08/20 0928  WBC 12.9* 7.2  --  7.2  HGB 8.7* 7.2*   < > 7.1*  HCT 26.8* 21.5*   < > 21.4*  PLT 270 183  --  189   < > = values in this interval not displayed.   No results for input(s): APTT, INR in the last 168 hours.       Assessment/Plan:  Victoria Austin is POD3 s/p PSF T11-L4. Will continue to monitor.  #PSF T11-L4 - both drains now out - mobilize - pain control - PTOT - Imaging completed - bowel regimen  #Acute blood loss anemia - if orthostatic/symptomatic, or recommended by medicine, may consider transfusion  #DM2 - appreciate medicine assistance with management of comorbidities - ideally, would like blood sugar to be <180  #preop UTI - E coli + - treated with 5 days of  nitrofurantoin preoperatively - consider continuing treatment per medicine team  #DVT Prophylaxis - DVT prophylaxis Lovenox 52mq q24  Meade Maw MD Department of Neurosurgery

## 2020-07-08 NOTE — Progress Notes (Signed)
   Chaplain On-Call responded to request from patient for a visit.  Chaplain provided listening presence as patient described her difficult health challenges and family relationships. Patient also described her reliance on her faith and prayer for coping with her situation.  Chaplain provided spiritual and emotional support and prayer.  Elmira Mate Alegria M.Div., [facility]

## 2020-07-09 LAB — BASIC METABOLIC PANEL
Anion gap: 8 (ref 5–15)
BUN: 19 mg/dL (ref 8–23)
CO2: 25 mmol/L (ref 22–32)
Calcium: 8.2 mg/dL — ABNORMAL LOW (ref 8.9–10.3)
Chloride: 101 mmol/L (ref 98–111)
Creatinine, Ser: 0.74 mg/dL (ref 0.44–1.00)
GFR, Estimated: >60 mL/min (ref >60)
Glucose, Bld: 153 mg/dL — ABNORMAL HIGH (ref 70–99)
Potassium: 4.5 mmol/L (ref 3.5–5.1)
Sodium: 134 mmol/L — ABNORMAL LOW (ref 135–145)

## 2020-07-09 LAB — CBC
HCT: 22.3 % — ABNORMAL LOW (ref 36.0–46.0)
Hemoglobin: 7.3 g/dL — ABNORMAL LOW (ref 12.0–15.0)
MCH: 30.4 pg (ref 26.0–34.0)
MCHC: 32.7 g/dL (ref 30.0–36.0)
MCV: 92.9 fL (ref 80.0–100.0)
Platelets: 222 10*3/uL (ref 150–400)
RBC: 2.4 MIL/uL — ABNORMAL LOW (ref 3.87–5.11)
RDW: 13.2 % (ref 11.5–15.5)
WBC: 5.6 10*3/uL (ref 4.0–10.5)
nRBC: 0 % (ref 0.0–0.2)

## 2020-07-09 LAB — GLUCOSE, CAPILLARY
Glucose-Capillary: 144 mg/dL — ABNORMAL HIGH (ref 70–99)
Glucose-Capillary: 146 mg/dL — ABNORMAL HIGH (ref 70–99)

## 2020-07-09 MED ORDER — METHOCARBAMOL 500 MG PO TABS: 500.0000 mg | ORAL_TABLET | Freq: Four times a day (QID) | ORAL | 0 refills | Status: DC | PRN

## 2020-07-09 MED ORDER — INSULIN GLARGINE 100 UNIT/ML ~~LOC~~ SOLN
10.0000 [IU] | Freq: Once | SUBCUTANEOUS | Status: DC
  Filled 2020-07-09: qty 0.1

## 2020-07-09 MED ORDER — INSULIN GLARGINE 100 UNIT/ML ~~LOC~~ SOLN
55.0000 [IU] | Freq: Every day | SUBCUTANEOUS | Status: DC
  Filled 2020-07-09: qty 0.55

## 2020-07-09 MED ORDER — OXYCODONE HCL 5 MG PO TABS: 5.0000 mg | ORAL_TABLET | ORAL | 0 refills | Status: AC | PRN

## 2020-07-09 MED ORDER — DULOXETINE HCL 30 MG PO CPEP: 90.0000 mg | ORAL_CAPSULE | Freq: Every day | ORAL | 3 refills | Status: DC

## 2020-07-09 NOTE — Progress Notes (Signed)
Physical Therapy Treatment Patient Details Name: Victoria Austin MRN: [id number] DOB: [birth year] Today's Date: 07/09/2020    History of Present Illness Pt is a 69 yo female who who presented with prior L1 compression fracture with pseudoarthrosis after prior spinal fusion in April of 2021.  Pt is now s/p posterior spinal fusion T11-L4, posterolateral arthrodesis T11 to L4, posterior segmental instrumentation T11 to L4 using Depuy Expedium, and bone marrow aspirate.  PMH includes: sleep apnea, HTN, anxiety, depression, bipolar disorder, CVA, hiatal hernia, GERD, arthritis, fibromyalgia, anemia, CKD III, DM, neuropathy, and scoliosis.    PT Comments    Patient making excellent progress towards meeting goals. Patient ambulating around the unit with rolling walker with supervision and occasional cues for safety. Patient is Mod I for sit to stand transfers and completes bed mobility with supervision demonstrating good safety awareness. No pain reported at rest or with standing activity. Patient is anticipated to discharge home today. Recommend HHPT with intermittent supervision from family as needed.    Follow Up Recommendations  Home health PT;Supervision - Intermittent     Equipment Recommendations  Rolling walker with 5" wheels    Recommendations for Other Services       Precautions / Restrictions Precautions Precautions: Back Precaution Comments: No Bending,lifting, Arching, or Twisting Restrictions Weight Bearing Restrictions: No    Mobility  Bed Mobility Overal bed mobility: Needs Assistance Bed Mobility: Supine to Sit;Sit to Supine Rolling: Supervision Sidelying to sit: Supervision     Sit to sidelying: Supervision General bed mobility comments: patient demonstrated logroll technique without difficulty.  Transfers Overall transfer level: Modified independent Equipment used: Rolling walker (2 wheeled) Transfers: Sit to/from Stand Sit to Stand: Modified independent  (Device/Increase time)         General transfer comment: Mod I with extra time required to complete tasks. Good safety awareness demonstrated  Ambulation/Gait Ambulation/Gait assistance: Supervision Gait Distance (Feet): 175 Feet Assistive device: Rolling walker (2 wheeled) Gait Pattern/deviations: WFL(Within Functional Limits) Gait velocity: decreased   General Gait Details: slow but steady cadence. no loss of balance with ambulation. occasional cues for safety   Stairs             Wheelchair Mobility    Modified Rankin (Stroke Patients Only)       Balance           Standing balance support: No upper extremity supported Standing balance-Leahy Scale: Fair                              Cognition Arousal/Alertness: Awake/alert Behavior During Therapy: WFL for tasks assessed/performed Overall Cognitive Status: Within Functional Limits for tasks assessed                                        Exercises      General Comments        Pertinent Vitals/Pain Pain Assessment: No/denies pain    Home Living                      Prior Function            PT Goals (current goals can now be found in the care plan section) Acute Rehab PT Goals Patient Stated Goal: to go home today PT Goal Formulation: With patient Time For Goal Achievement: 07/19/20 Potential to Achieve Goals: Good Progress  towards PT goals: Progressing toward goals    Frequency    7X/week      PT Plan      Co-evaluation              AM-PAC PT "6 Clicks" Mobility   Outcome Measure  Help needed turning from your back to your side while in a flat bed without using bedrails?: None Help needed moving from lying on your back to sitting on the side of a flat bed without using bedrails?: None Help needed moving to and from a bed to a chair (including a wheelchair)?: None Help needed standing up from a chair using your arms (e.g., wheelchair  or bedside chair)?: None Help needed to walk in hospital room?: A Little Help needed climbing 3-5 steps with a railing? : A Little 6 Click Score: 22    End of Session Equipment Utilized During Treatment: Gait belt Activity Tolerance: Patient tolerated treatment well Patient left: in bed;with call bell/phone within reach;with bed alarm set Nurse Communication: Mobility status PT Visit Diagnosis: Muscle weakness (generalized) (M62.81);Difficulty in walking, not elsewhere classified (R26.2);History of falling (Z91.81)     Time: 2025-4270 PT Time Calculation (min) (ACUTE ONLY): 28 min  Charges:  $Gait Training: 23-37 mins                     Minna Merritts, PT, MPT    Percell Locus 07/09/2020, 12:48 PM

## 2020-07-09 NOTE — TOC Progression Note (Signed)
Transition of Care [facility]) - Progression Note    Patient Details  Name: Victoria Austin MRN: [id number] Date of Birth: Aug 08, 1950  Transition of Care [facility]) CM/SW Contact  Izola Price, RN Phone Number: 07/09/2020, 11:26 AM  Clinical Narrative:   12/12 1126   Discharge orders written for today. Clarified with provider that [facility] PT and DME still applicable as DC Home/Self Care was written this am.   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   Contacted [facility] to confirmed [facility] acceptance and notified of discharge.   [facility] contacted, bed to be delivered 12/13, and stated patient had purchased walker in April so insurance will not pay for another.   PT saw patient again and she passed stair test yesterday so patient was good for discharge from PT POV.   Coummuncation via team members, provider, PT,  and unit RN via secure chat.   Simmie Davies RN CM    Expected Discharge Plan: [place] Barriers to Discharge: Continued Medical Work up  Expected Discharge Plan and Services Expected Discharge Plan: [place] arrangements for the past 2 months: Single Family Home Expected Discharge Date: 07/09/20               DME Arranged: Gilford Rile rolling,Hospital bed DME Agency: [facility] Date DME Agency Contacted: 07/06/20 Time DME Agency Contacted: [phone] Representative spoke with at DME Agency: [place]: PT,OT [place] Date [place]: 07/06/20 Time [place]: [id number] Representative spoke with at [place]: [place] ([place]) Interventions    Readmission Risk Interventions Readmission Risk Prevention Plan 07/07/2020  Transportation Screening Complete  Medication Review Press photographer) Complete  PCP or Specialist appointment within 3-5 days of discharge Complete  HRI or [place] Complete  SW Recovery Care/Counseling Consult Complete  [place] Not Applicable  Some recent data might be hidden

## 2020-07-09 NOTE — Progress Notes (Signed)
Patient is stable and ready for discharge home with H/H services. Patienty IV removed. Wound vac removed by neurosurgery and writer placed cleaned wound vac at nursing desk for pick up. Patient's daughter present at bedside when writer went over doischarge paperwork with patient and both had no questions. Hard script prescriptions placed in discharge packet. Patient's daughter packed patient's belongings and dressed her. NT transported patient via [place] to daughter's car for home.

## 2020-07-09 NOTE — Discharge Summary (Signed)
Physician Discharge Summary  Patient ID: Victoria Austin MRN: [id number] DOB/AGE: Feb 21, 1951 69 y.o.  Admit date: 07/05/2020 Discharge date: 07/09/2020  Admission Diagnoses: Pseudoarthrosis  Discharge Diagnoses:  Principal Problem:   DM type 2 with diabetic peripheral neuropathy ([place]) Active Problems:   Dyslipidemia associated with type 2 diabetes mellitus ([place])   Diabetic neuropathy ([place])   Benign essential HTN   Lumbar radiculopathy   Discharged Condition: good  Hospital Course: Victoria Austin was admitted to the hospital through the operating room on 12/8.  She underwent the T11-L4 posterior spinal fusion without complication.  She was transferred to the floor.  She had the wound VAC placed over the incision.  Over the next couple days she continued to improve.  She was monitored for blood sugars and acute blood loss anemia.  She had some confusion and was noted to have an E. coli infection was started on antibiotics.  She continue to work with physical therapy and was cleared for home with physical therapy on 12/11.  Her postoperative x-rays were complete.  She was able to void spontaneously.  She was ambulating in the halls.  She is tolerating a diet.  She has met criteria for discharge home.  Consults: Hospitalist    Discharge Exam: Blood pressure 139/74, pulse 72, temperature 97.8 F (36.6 C), temperature source Oral, resp. rate 17, height _0  (1.6 m), weight 82.3 kg, SpO2 99 %. Incisions clean and dry, wound VAC removed. 5-5 strength in bilateral lower extremities in hip flexion, knee flexion, knee extension, dorsi, plantar flexion Intact to light touch throughout lower extremities, pain noted in the right foot from fracture  Disposition: Discharge disposition: 01-Home or Self Care     Home  Discharge Instructions    Diet - low sodium heart healthy   Complete by: As directed    Increase activity slowly   Complete by: As directed    Remove dressing in 24 hours    Complete by: As directed      Allergies as of 07/09/2020      Reactions   Bee Venom Anaphylaxis   Fire Ant Anaphylaxis   Other Anaphylaxis   Green peppers   Ciprofloxacin Nausea And Vomiting   Cyclobenzaprine Nausea And Vomiting   Diazepam Nausea And Vomiting   Ketorolac    HALLUCINATIONS   Ondansetron Nausea And Vomiting   ABRUPT VOMITTING   Ranitidine Hcl    FEELS LIKE BUGS CRAWLING ALL OVER HER   Ambien [zolpidem] Other (See Comments)   Severely sedating   Bacitracin-polymyxin B    BURNS SKIN   Mushroom Extract Complex Nausea And Vomiting   Neomycin-polymyxin-gramicidin Rash   BURNS SKIN      Medication List    STOP taking these medications   orphenadrine 100 MG tablet Commonly known as: NORFLEX   oxyCODONE-acetaminophen 5-325 MG tablet Commonly known as: PERCOCET/ROXICET     TAKE these medications   acetaminophen 500 MG tablet Commonly known as: TYLENOL Take 1,000 mg by mouth every 6 (six) hours as needed for headache.   albuterol 108 (90 Base) MCG/ACT inhaler Commonly known as: ProAir HFA Inhale 1 puff into the lungs 2 (two) times daily as needed. What changed: when to take this   alendronate 70 MG tablet Commonly known as: FOSAMAX Take 70 mg by mouth every Monday.   amLODipine 10 MG tablet Commonly known as: NORVASC Take 1 tablet (10 mg total) by mouth daily.   B-12 Compliance Injection 1000 MCG/ML Kit Generic drug: Cyanocobalamin Inject 1,000  mcg as directed every 30 (thirty) days.   bisacodyl 5 MG EC tablet Commonly known as: DULCOLAX Take 5 mg by mouth daily as needed for moderate constipation.   carbamazepine 200 MG tablet Commonly known as: TEGRETOL Take 400 mg by mouth 3 (three) times daily. Brianne Klinger,OIC   clopidogrel 75 MG tablet Commonly known as: PLAVIX Take 1 tablet (75 mg total) by mouth daily.   docusate sodium 100 MG capsule Commonly known as: COLACE Take 100 mg by mouth every other day.   DULoxetine 30 MG  capsule Commonly known as: CYMBALTA Take 3 capsules (90 mg total) by mouth daily. Start taking on: July 10, 2020   EPINEPHrine 0.3 mg/0.3 mL Soaj injection Commonly known as: EpiPen 2-Pak Inject 0.3 mLs (0.3 mg total) into the muscle as needed for anaphylaxis. Then call 911 for ER transport.   ferrous sulfate 325 (65 FE) MG tablet Take 325 mg by mouth daily.   fluticasone-salmeterol 230-21 MCG/ACT inhaler Commonly known as: ADVAIR HFA Inhale 2 puffs into the lungs 2 (two) times daily.   gabapentin 300 MG capsule Commonly known as: NEURONTIN Take 1 capsule (300 mg total) by mouth at bedtime.   gemfibrozil 600 MG tablet Commonly known as: LOPID Take 600 mg by mouth 2 (two) times daily.   insulin glargine 100 UNIT/ML Solostar Pen Commonly known as: LANTUS Inject 80-90 Units into the skin at bedtime.   Jardiance 25 MG Tabs tablet Generic drug: empagliflozin Take 25 mg by mouth daily.   lisinopril 20 MG tablet Commonly known as: ZESTRIL Take 20 mg by mouth daily.   loratadine 10 MG tablet Commonly known as: CLARITIN Take 1 tablet (10 mg total) by mouth daily. What changed:   when to take this  reasons to take this   LORazepam 1 MG tablet Commonly known as: ATIVAN Take 1 mg by mouth See admin instructions. Take 1 mg at night, may take a second 1 mg dose during the day as needed for anxiety   meclizine 25 MG tablet Commonly known as: ANTIVERT Take 1 tablet (25 mg total) by mouth daily as needed for dizziness. At most once daily , must last 90 days What changed:   when to take this  additional instructions   methocarbamol 500 MG tablet Commonly known as: ROBAXIN Take 1 tablet (500 mg total) by mouth every 6 (six) hours as needed. What changed:   how much to take  when to take this  reasons to take this   mirabegron ER 50 MG Tb24 tablet Commonly known as: MYRBETRIQ Take 1 tablet (50 mg total) by mouth daily. What changed: when to take this    montelukast 10 MG tablet Commonly known as: SINGULAIR Take 10 mg by mouth daily as needed (allergies).   NovoLOG FlexPen 100 UNIT/ML FlexPen Generic drug: insulin aspart Inject 22-34 Units into the skin 3 (three) times daily. With meals What changed:   how much to take  when to take this  additional instructions   oxyCODONE 5 MG immediate release tablet Commonly known as: Oxy IR/ROXICODONE Take 1 tablet (5 mg total) by mouth every 4 (four) hours as needed for moderate pain (take 1 tablet for 4-6/10 pain, 2 tablets for 7-10/10).   pantoprazole 40 MG tablet Commonly known as: PROTONIX Take 1 tablet (40 mg total) by mouth daily.   primidone 250 MG tablet Commonly known as: MYSOLINE Take 250 mg by mouth at bedtime.   promethazine 25 MG tablet Commonly known as: PHENERGAN Take  1 tablet (25 mg total) by mouth every 6 (six) hours as needed. What changed: reasons to take this   propranolol 10 MG tablet Commonly known as: INDERAL Take 1 tablet (10 mg total) by mouth 2 (two) times daily. What changed: when to take this   rosuvastatin 20 MG tablet Commonly known as: Crestor Take 1 tablet (20 mg total) by mouth daily.   Simethicone 125 MG Caps Take 125 mg by mouth daily as needed (gas).   trimethoprim 100 MG tablet Commonly known as: TRIMPEX Take 1 tablet (100 mg total) by mouth daily. What changed: when to take this            Durable Medical Equipment  (From admission, onward)         Start     Ordered   07/06/20 1730  For home use only DME 3 n 1  Once       Comments: BEDSIDE COMMODE   07/06/20 1729   07/06/20 1511  For home use only DME Walker rolling  Once       Question Answer Comment  Walker: With Rayle Wheels   Patient needs a walker to treat with the following condition S/P lumbar fusion      07/06/20 1510   07/06/20 1511  For home use only DME Hospital bed  Once       Question Answer Comment  Length of Need 6 Months   Patient has (list medical  condition): s/p lumbar fusion   The above medical condition requires: Patient requires the ability to reposition frequently   Bed type Semi-electric      07/06/20 1511          Follow-up Information    Whitaker, Jason Hestle, PA-C Follow up in 5 day(s).   Specialty: Family Medicine Why: Please follow up within 3-5 days of discharge.  Contact information: [place] [address] [phone]        Kate Sable, MD .   Specialties: Cardiology, Radiology Contact information: [place] [address] [phone]               Signed: Deetta Perla 07/09/2020, 10:29 AM

## 2020-07-09 NOTE — Progress Notes (Signed)
PROGRESS NOTE    Victoria Austin  XTG:[id number] DOB: Dec 22, 1950 DOA: 07/05/2020 PCP: Donnamarie Rossetti, PA-C   Brief Narrative:  HPI: Victoria Austin is a 69 y.o. female with medical history significant for lumbar radiculopathy status post lumbar fusion in April 2021, type 2 diabetes mellitus, with most recent hemoglobin A1c noted to be 6.9% when checked in April 2021, hypertension, hyperlipidemia, obstructive sleep apnea noncompliant with home CPAP, who is admitted to [facility] on 07/05/2020 neurosurgery service for elective hardware removal relating to prior lumbar fusion surgery.   In the setting of history of lumbar radiculopathy the patient underwent underwent T11-L3 posterior fusion in April 2021.  Today, the patient has been admitted for elective associated hardware removal, which was performed earlier today in the setting of intraoperative overt complications.  Postoperative consulted the hospitalist service management of the patient's multiple chronic medical problems, which include type 2 diabetes mellitus, hypertension, hyperlipidemia.  The patient currently reports pain control.  She denies any recent chest pain, shortness of breath, palpitations, or diaphoresis.  Denies any recent nausea/vomiting.   While her home medication list currently basal insulin in the form of Lantus 80 units subcu nightly, the patient reports that she actually takes 50 units of Lantus on a nightly basis.  This is in addition to sliding scale NovoLog taken 3 times daily with meals.  Per chart review, it appears that most recent hemoglobin A1c was checked in April 2021, and found to be 6.9% at that time.  Her diabetes is complicated neuropathy, for which she takes gabapentin at home.  In addition to this insulin regimen, she is also on empagliflozin at home.  In the setting of a history of essential hypertension, outpatient antihypertensive regimen includes lisinopril as well as Norvasc.   She knowledges a history of hyperlipidemia, for which she is on gemfibrozil at home, and reports that she is not currently on as an outpatient.  She knowledges a history of obstructive sleep apnea, but reports poor compliance with use of nocturnal CPAP as an outpatient.  Assessment & Plan:   Principal Problem:   DM type 2 with diabetic peripheral neuropathy ([place]) Active Problems:   Dyslipidemia associated with type 2 diabetes mellitus (HCC)   Diabetic neuropathy (H[facility])   Benign essential HTN   Lumbar radiculopathy   #) Type 2 diabetes mellitus: Complicated by polyperipheral neuropathy for which she is on gabapentin.  Outpatient insulin regimen, per the patient, consist of Lantus 50 units subcu nightly as well as sliding scale NovoLog 3 times daily with meals. She is also on Empagliflozin.  Blood sugar better but is still slightly elevated.  She received 45 units last night.  We will give her 10 units now and starting tonight, will increase to 55 units and continue SSI if she remains to be hospitalized anymore.  #) Essential hypertension: On lisinopril as well as Norvasc at home which were discontinued  due to significantly low blood pressure.  Blood pressure now within normal range so I will continue to hold them.  If she were to discharge home today, I recommend discharging on only lisinopril and discontinue amlodipine.  #) Hyperlipidemia: Continue gemfibrozil.  #) Obstructive sleep apnea: Poorly compliant with nocturnal CPAP as an outpatient.  #) Lumbar radiculopathy: Status post T11-L3 posterior fusion in April 2021, with continued pain.  Now  s/p PSF T11-L4.  Seen by PT. they initially recommended SNF but now they recommend home with home health.  Patient wants to go and feels  ready.  Will defer management and discharge plan per primary service/neurosurgery.  Asymptomatic bacteriuria: Patient symptoms are nonspecific and her UA is not highly indicative of UTI.  She was recently treated  for UTI with 5 days of nitrofurantoin preoperatively as well.  Will watch off antibiotics.  Acute/postoperative blood loss anemia: Hemoglobin has remained stable over 7.  No indication of blood transfusion.  Hyperkalemia: Resolved.  Patient is clear for discharge from medical perspective.  Nothing much that medicine service is managing actively.  If patient happens to stay overnight, we will review chart and manage her diabetes remotely.  DVT prophylaxis: enoxaparin (LOVENOX) injection 40 mg Start: 07/06/20 0800 SCD's Start: 07/05/20 1747 Place TED hose Start: 07/05/20 0918   Code Status: Full Code  Family Communication: None present at bedside.  Plan of care discussed with patient in length and he verbalized understanding and agreed with it.     Estimated body mass index is 32.14 kg/m as calculated from the following:   Height as of this encounter: 5\' 3"  (1.6 m).   Weight as of this encounter: 82.3 kg.      Nutritional status:               Consultants:   Hospitalist  Procedures:   As above  Antimicrobials:  Anti-infectives (From admission, onward)   Start     Dose/Rate Route Frequency Ordered Stop   07/07/20 2200  trimethoprim (TRIMPEX) tablet 100 mg  Status:  Discontinued       Note to Pharmacy: Per Urologist for recurrent UTIs   100 mg Oral Daily at bedtime 07/07/20 1257 07/07/20 1303   07/07/20 2200  trimethoprim (TRIMPEX) tablet 100 mg       Note to Pharmacy: Recurrent UTIs   100 mg Oral Daily at bedtime 07/07/20 1303     07/05/20 1618  vancomycin (VANCOCIN) powder  Status:  Discontinued          As needed 07/05/20 1625 07/05/20 1739   07/05/20 1236  ceFAZolin (ANCEF) 2-4 GM/100ML-% IVPB       Note to Pharmacy: Norton Blizzard  : cabinet override      07/05/20 1236 07/05/20 1355   07/05/20 1145  ceFAZolin (ANCEF) IVPB 2g/100 mL premix        2 g 200 mL/hr over 30 Minutes Intravenous  Once 07/05/20 1142 07/05/20 1341   07/05/20 1145  vancomycin  (VANCOREADY) IVPB 1250 mg/250 mL        1,250 mg 166.7 mL/hr over 90 Minutes Intravenous  Once 07/05/20 1142 07/06/20 0812         Subjective: Seen and examined.  Feels much better.  Minimal back pain.  Wants to go home.  Objective: Vitals:   07/08/20 2022 07/08/20 2333 07/09/20 0417 07/09/20 0758  BP: 138/71 (!) 143/58 (!) 122/54 139/74  Pulse: 83 78 68 72  Resp: 17 17 17 17   Temp: 99.3 F (37.4 C) 98.7 F (37.1 C) 97.9 F (36.6 C) 97.8 F (36.6 C)  TempSrc: Oral Oral Oral Oral  SpO2: 100% 100% 95% 99%  Weight:      Height:        Intake/Output Summary (Last 24 hours) at 07/09/2020 1010 Last data filed at 07/08/2020 1400 Gross per 24 hour  Intake 360 ml  Output -  Net 360 ml   Filed Weights   07/05/20 1200  Weight: 82.3 kg    Examination:  General exam: Appears calm and comfortable  Respiratory system: Clear to auscultation. Respiratory  effort normal. Cardiovascular system: S1 & S2 heard, RRR. No JVD, murmurs, rubs, gallops or clicks. No pedal edema. Gastrointestinal system: Abdomen is nondistended, soft and nontender. No organomegaly or masses felt. Normal bowel sounds heard. Central nervous system: Alert and oriented. No focal neurological deficits. Extremities: Symmetric 5 x 5 power. Skin: No rashes, lesions or ulcers.  Psychiatry: Judgement and insight appear normal. Mood & affect appropriate.     Data Reviewed: I have personally reviewed following labs and imaging studies  CBC: Recent Labs  Lab 07/06/20 0523 07/07/20 0520 07/07/20 1234 07/08/20 0928 07/08/20 1947 07/09/20 0525  WBC 12.9* 7.2  --  7.2 7.9 5.6  HGB 8.7* 7.2* 7.4* 7.1* 7.2* 7.3*  HCT 26.8* 21.5* 22.8* 21.4* 21.9* 22.3*  MCV 94.7 93.1  --  92.2 93.2 92.9  PLT 270 183  --  189 219 676   Basic Metabolic Panel: Recent Labs  Lab 07/05/20 1212 07/06/20 0523 07/07/20 0520 07/08/20 0928 07/09/20 0525  NA 142 134* 134* 134* 134*  K 4.9 5.2* 4.4 4.7 4.5  CL 106 103 103 102 101   CO2  --  23 22 25 25   GLUCOSE 166* 179* 178* 225* 153*  BUN 31* 32* 21 17 19   CREATININE 1.00 0.98 0.84 0.88 0.74  CALCIUM  --  7.8* 7.5* 8.0* 8.2*   GFR: Estimated Creatinine Clearance: 68.4 mL/min (by C-G formula based on SCr of 0.74 mg/dL). Liver Function Tests: No results for input(s): AST, ALT, ALKPHOS, BILITOT, PROT, ALBUMIN in the last 168 hours. No results for input(s): LIPASE, AMYLASE in the last 168 hours. No results for input(s): AMMONIA in the last 168 hours. Coagulation Profile: No results for input(s): INR, PROTIME in the last 168 hours. Cardiac Enzymes: No results for input(s): CKTOTAL, CKMB, CKMBINDEX, TROPONINI in the last 168 hours. BNP (last 3 results) No results for input(s): PROBNP in the last 8760 hours. HbA1C: No results for input(s): HGBA1C in the last 72 hours. CBG: Recent Labs  Lab 07/08/20 0806 07/08/20 1221 07/08/20 1652 07/08/20 2129 07/09/20 0758  GLUCAP 137* 144* 160* 184* 144*   Lipid Profile: No results for input(s): CHOL, HDL, LDLCALC, TRIG, CHOLHDL, LDLDIRECT in the last 72 hours. Thyroid Function Tests: No results for input(s): TSH, T4TOTAL, FREET4, T3FREE, THYROIDAB in the last 72 hours. Anemia Panel: No results for input(s): VITAMINB12, FOLATE, FERRITIN, TIBC, IRON, RETICCTPCT in the last 72 hours. Sepsis Labs: No results for input(s): PROCALCITON, LATICACIDVEN in the last 168 hours.  Recent Results (from the past 240 hour(s))  SARS CORONAVIRUS 2 (TAT 6-24 HRS) Nasopharyngeal Nasopharyngeal Swab     Status: None   Collection Time: 07/03/20  9:35 AM   Specimen: Nasopharyngeal Swab  Result Value Ref Range Status   SARS Coronavirus 2 NEGATIVE NEGATIVE Final    Comment: (NOTE) SARS-CoV-2 target nucleic acids are NOT DETECTED.  The SARS-CoV-2 RNA is generally detectable in upper and lower respiratory specimens during the acute phase of infection. Negative results do not preclude SARS-CoV-2 infection, do not rule out co-infections  with other pathogens, and should not be used as the sole basis for treatment or other patient management decisions. Negative results must be combined with clinical observations, patient history, and epidemiological information. The expected result is Negative.  Fact Sheet for Patients: SugarRoll.be  Fact Sheet for Healthcare Providers: https://www.woods-mathews.com/  This test is not yet approved or cleared by the [place] FDA and  has been authorized for detection and/or diagnosis of SARS-CoV-2 by FDA under an Emergency  Use Authorization (EUA). This EUA will remain  in effect (meaning this test can be used) for the duration of the COVID-19 declaration under Se ction 564(b)(1) of the Act, 21 U.S.C. section 360bbb-3(b)(1), unless the authorization is terminated or revoked sooner.  Performed at [facility], [place] [address] [place], [place] [address]   Urine Culture     Status: None   Collection Time: 07/07/20 12:07 PM   Specimen: Urine, Random  Result Value Ref Range Status   Specimen Description   Final    URINE, RANDOM Performed at [facility], [address], [place], [place] [address]    Special Requests   Final    NONE Performed at [facility], [address], [place], [place] [address]    Culture   Final    NO GROWTH Performed at [facility], [place] [address] [place],  [address]    Report Status 07/08/2020 FINAL  Final      Radiology Studies: No results found.  Scheduled Meds: . albuterol  2.5 mg Nebulization BID  . carbamazepine  400 mg Oral TID  . Chlorhexidine Gluconate Cloth  6 each Topical Daily  . docusate sodium  100 mg Oral BID  . DULoxetine  90 mg Oral Daily  . enoxaparin (LOVENOX) injection  0.5 mg/kg Subcutaneous Q24H  . gabapentin  300 mg Oral QHS  . gemfibrozil  600 mg Oral BID  . insulin aspart  0-15 Units Subcutaneous TID WC  . insulin glargine  10 Units  Subcutaneous Once  . insulin glargine  55 Units Subcutaneous QHS  . loratadine  10 mg Oral Daily  . magnesium hydroxide  30 mL Oral BID  . methocarbamol  750 mg Oral Q6H  . mirabegron ER  50 mg Oral QHS  . mometasone-formoterol  2 puff Inhalation BID  . pantoprazole  40 mg Oral Daily  . primidone  250 mg Oral QHS  . propranolol  10 mg Oral BID  . rosuvastatin  20 mg Oral Daily  . senna  2 tablet Oral BID  . sodium chloride flush  3 mL Intravenous Q12H  . trimethoprim  100 mg Oral QHS   Continuous Infusions: . sodium chloride 50 mL/hr (07/06/20 0826)  . methocarbamol (ROBAXIN) IV 500 mg (07/06/20 1819)     LOS: 4 days   Time spent: 25 min   Darliss Cheney, MD [facility]  07/09/2020, 10:10 AM   To contact the attending provider between 7A-7P or the covering provider during after hours 7P-7A, please log into the web site [email or web address].

## 2020-07-09 NOTE — Discharge Instructions (Addendum)
NEUROSURGERY DISCHARGE INSTRUCTIONS  Admission diagnosis: Lumbar radiculopathy [M54.16]  Operative procedure: T11-L4 Fusion  What to do after you leave the hospital:  Recommended diet: Regular diet  Increase protein intake to promote wound healing.  Recommended activity: no heavy lifting, pushing, pulling but it is important to walk safely. No driving until cleared in clinic  Take stool softener if taking narcotics.  Restart plavix 1 week after surgery  Special Instructions  No straining, no heavy lifting > 10lbs.  Keep incision area clean and dry. Clean incision daily. Sutures to be removed in clinic  Please Report any of the following: Nausea or Vomiting, Temperature is greater than 101.71F (38.1C) degrees, Dizziness, Difficulty Breathing or Shortness of Breath, Inability to Eat, drink Fluids, or Take medications, Bleeding, swelling, or drainage from surgical incision sites, New numbness or weakness, Altered mental status and Bowel or bladder dysfunction to the neurosurgeon on call at [phone]    Please see below for scheduled appointments:   Future Appointments  Date Time Provider [place]  09/21/2020  9:00 AM Billey Co, MD [facility] None  09/22/2020  4:00 PM Kate Sable, MD [facility]  10/16/2020  1:30 PM [facility] None  10/18/2020 10:30 AM Earlie Server, MD [facility] None

## 2020-07-11 DIAGNOSIS — Z7984 Long term (current) use of oral hypoglycemic drugs: Secondary | ICD-10-CM | POA: Diagnosis not present

## 2020-07-11 DIAGNOSIS — D62 Acute posthemorrhagic anemia: Secondary | ICD-10-CM | POA: Diagnosis not present

## 2020-07-11 DIAGNOSIS — M5416 Radiculopathy, lumbar region: Secondary | ICD-10-CM | POA: Diagnosis not present

## 2020-07-11 DIAGNOSIS — Z981 Arthrodesis status: Secondary | ICD-10-CM | POA: Diagnosis not present

## 2020-07-11 DIAGNOSIS — E785 Hyperlipidemia, unspecified: Secondary | ICD-10-CM | POA: Diagnosis not present

## 2020-07-11 DIAGNOSIS — Z794 Long term (current) use of insulin: Secondary | ICD-10-CM | POA: Diagnosis not present

## 2020-07-11 DIAGNOSIS — I10 Essential (primary) hypertension: Secondary | ICD-10-CM | POA: Diagnosis not present

## 2020-07-11 DIAGNOSIS — Z4789 Encounter for other orthopedic aftercare: Secondary | ICD-10-CM | POA: Diagnosis not present

## 2020-07-11 DIAGNOSIS — Z9181 History of falling: Secondary | ICD-10-CM | POA: Diagnosis not present

## 2020-07-11 DIAGNOSIS — E1169 Type 2 diabetes mellitus with other specified complication: Secondary | ICD-10-CM | POA: Diagnosis not present

## 2020-07-11 DIAGNOSIS — Z741 Need for assistance with personal care: Secondary | ICD-10-CM | POA: Diagnosis not present

## 2020-07-11 DIAGNOSIS — M4807 Spinal stenosis, lumbosacral region: Secondary | ICD-10-CM | POA: Diagnosis not present

## 2020-07-11 DIAGNOSIS — Z7902 Long term (current) use of antithrombotics/antiplatelets: Secondary | ICD-10-CM | POA: Diagnosis not present

## 2020-07-11 DIAGNOSIS — E1142 Type 2 diabetes mellitus with diabetic polyneuropathy: Secondary | ICD-10-CM | POA: Diagnosis not present

## 2020-07-12 ENCOUNTER — Encounter: Payer: Self-pay | Admitting: Neurosurgery

## 2020-07-13 DIAGNOSIS — D62 Acute posthemorrhagic anemia: Secondary | ICD-10-CM | POA: Diagnosis not present

## 2020-07-13 DIAGNOSIS — I10 Essential (primary) hypertension: Secondary | ICD-10-CM | POA: Diagnosis not present

## 2020-07-13 DIAGNOSIS — E1142 Type 2 diabetes mellitus with diabetic polyneuropathy: Secondary | ICD-10-CM | POA: Diagnosis not present

## 2020-07-13 DIAGNOSIS — E1169 Type 2 diabetes mellitus with other specified complication: Secondary | ICD-10-CM | POA: Diagnosis not present

## 2020-07-13 DIAGNOSIS — Z4789 Encounter for other orthopedic aftercare: Secondary | ICD-10-CM | POA: Diagnosis not present

## 2020-07-13 DIAGNOSIS — M4807 Spinal stenosis, lumbosacral region: Secondary | ICD-10-CM | POA: Diagnosis not present

## 2020-07-14 DIAGNOSIS — E1169 Type 2 diabetes mellitus with other specified complication: Secondary | ICD-10-CM | POA: Diagnosis not present

## 2020-07-14 DIAGNOSIS — D62 Acute posthemorrhagic anemia: Secondary | ICD-10-CM | POA: Diagnosis not present

## 2020-07-14 DIAGNOSIS — M4807 Spinal stenosis, lumbosacral region: Secondary | ICD-10-CM | POA: Diagnosis not present

## 2020-07-14 DIAGNOSIS — Z4789 Encounter for other orthopedic aftercare: Secondary | ICD-10-CM | POA: Diagnosis not present

## 2020-07-14 DIAGNOSIS — I10 Essential (primary) hypertension: Secondary | ICD-10-CM | POA: Diagnosis not present

## 2020-07-14 DIAGNOSIS — E1142 Type 2 diabetes mellitus with diabetic polyneuropathy: Secondary | ICD-10-CM | POA: Diagnosis not present

## 2020-07-17 DIAGNOSIS — E1169 Type 2 diabetes mellitus with other specified complication: Secondary | ICD-10-CM | POA: Diagnosis not present

## 2020-07-17 DIAGNOSIS — I10 Essential (primary) hypertension: Secondary | ICD-10-CM | POA: Diagnosis not present

## 2020-07-17 DIAGNOSIS — E1142 Type 2 diabetes mellitus with diabetic polyneuropathy: Secondary | ICD-10-CM | POA: Diagnosis not present

## 2020-07-17 DIAGNOSIS — D62 Acute posthemorrhagic anemia: Secondary | ICD-10-CM | POA: Diagnosis not present

## 2020-07-17 DIAGNOSIS — Z4789 Encounter for other orthopedic aftercare: Secondary | ICD-10-CM | POA: Diagnosis not present

## 2020-07-17 DIAGNOSIS — M4807 Spinal stenosis, lumbosacral region: Secondary | ICD-10-CM | POA: Diagnosis not present

## 2020-07-18 DIAGNOSIS — D62 Acute posthemorrhagic anemia: Secondary | ICD-10-CM | POA: Diagnosis not present

## 2020-07-18 DIAGNOSIS — E1142 Type 2 diabetes mellitus with diabetic polyneuropathy: Secondary | ICD-10-CM | POA: Diagnosis not present

## 2020-07-18 DIAGNOSIS — E1169 Type 2 diabetes mellitus with other specified complication: Secondary | ICD-10-CM | POA: Diagnosis not present

## 2020-07-18 DIAGNOSIS — Z4789 Encounter for other orthopedic aftercare: Secondary | ICD-10-CM | POA: Diagnosis not present

## 2020-07-18 DIAGNOSIS — I10 Essential (primary) hypertension: Secondary | ICD-10-CM | POA: Diagnosis not present

## 2020-07-18 DIAGNOSIS — M4807 Spinal stenosis, lumbosacral region: Secondary | ICD-10-CM | POA: Diagnosis not present

## 2020-07-20 DIAGNOSIS — Z981 Arthrodesis status: Secondary | ICD-10-CM | POA: Diagnosis not present

## 2020-07-20 DIAGNOSIS — M47816 Spondylosis without myelopathy or radiculopathy, lumbar region: Secondary | ICD-10-CM | POA: Diagnosis not present

## 2020-07-20 DIAGNOSIS — M4697 Unspecified inflammatory spondylopathy, lumbosacral region: Secondary | ICD-10-CM | POA: Diagnosis not present

## 2020-07-20 DIAGNOSIS — M47817 Spondylosis without myelopathy or radiculopathy, lumbosacral region: Secondary | ICD-10-CM | POA: Diagnosis not present

## 2020-07-20 DIAGNOSIS — M4326 Fusion of spine, lumbar region: Secondary | ICD-10-CM | POA: Diagnosis not present

## 2020-07-25 ENCOUNTER — Other Ambulatory Visit: Payer: Self-pay | Admitting: Nurse Practitioner

## 2020-07-25 DIAGNOSIS — Z981 Arthrodesis status: Secondary | ICD-10-CM

## 2020-07-26 DIAGNOSIS — I10 Essential (primary) hypertension: Secondary | ICD-10-CM | POA: Diagnosis not present

## 2020-07-26 DIAGNOSIS — M4807 Spinal stenosis, lumbosacral region: Secondary | ICD-10-CM | POA: Diagnosis not present

## 2020-07-26 DIAGNOSIS — Z4789 Encounter for other orthopedic aftercare: Secondary | ICD-10-CM | POA: Diagnosis not present

## 2020-07-26 DIAGNOSIS — D62 Acute posthemorrhagic anemia: Secondary | ICD-10-CM | POA: Diagnosis not present

## 2020-07-26 DIAGNOSIS — E1169 Type 2 diabetes mellitus with other specified complication: Secondary | ICD-10-CM | POA: Diagnosis not present

## 2020-07-26 DIAGNOSIS — E1142 Type 2 diabetes mellitus with diabetic polyneuropathy: Secondary | ICD-10-CM | POA: Diagnosis not present

## 2020-08-01 ENCOUNTER — Other Ambulatory Visit: Payer: Medicare Other

## 2020-08-02 ENCOUNTER — Ambulatory Visit
Admission: RE | Admit: 2020-08-02 | Discharge: 2020-08-02 | Disposition: A | Discharge status: Home / Self Care | Payer: Medicare Other | Source: Ambulatory Visit | Attending: Nurse Practitioner | Admitting: Nurse Practitioner

## 2020-08-02 DIAGNOSIS — M4856XA Collapsed vertebra, not elsewhere classified, lumbar region, initial encounter for fracture: Secondary | ICD-10-CM | POA: Insufficient documentation

## 2020-08-02 DIAGNOSIS — M48061 Spinal stenosis, lumbar region without neurogenic claudication: Secondary | ICD-10-CM | POA: Diagnosis not present

## 2020-08-02 DIAGNOSIS — Z981 Arthrodesis status: Secondary | ICD-10-CM | POA: Diagnosis present

## 2020-08-17 ENCOUNTER — Ambulatory Visit: Payer: Medicare Other | Admitting: Urology

## 2020-08-31 ENCOUNTER — Ambulatory Visit (INDEPENDENT_AMBULATORY_CARE_PROVIDER_SITE_OTHER): Payer: Medicare Other | Admitting: Urology

## 2020-08-31 ENCOUNTER — Other Ambulatory Visit: Payer: Self-pay

## 2020-08-31 VITALS — BP 117/75 | HR 78 | Temp 97.8°F | Ht 64.0 in | Wt 180.0 lb

## 2020-08-31 DIAGNOSIS — N952 Postmenopausal atrophic vaginitis: Secondary | ICD-10-CM

## 2020-08-31 DIAGNOSIS — N390 Urinary tract infection, site not specified: Secondary | ICD-10-CM

## 2020-08-31 LAB — URINALYSIS, COMPLETE
Bilirubin, UA: NEGATIVE
Ketones, UA: NEGATIVE
Nitrite, UA: POSITIVE — AB
RBC, UA: NEGATIVE
Specific Gravity, UA: 1.015 (ref 1.005–1.030)
Urobilinogen, Ur: 0.2 mg/dL (ref 0.2–1.0)
pH, UA: 5.5 (ref 5.0–7.5)

## 2020-08-31 LAB — MICROSCOPIC EXAMINATION: WBC, UA: >30 /hpf — AB (ref 0–5)

## 2020-08-31 MED ORDER — ESTRADIOL 0.1 MG/GM VA CREA: TOPICAL_CREAM | VAGINAL | 12 refills | Status: AC

## 2020-08-31 NOTE — Progress Notes (Signed)
08/31/2020 8:50 PM   Victoria Austin 03/06/1951 [id number]  Referring provider: Donnamarie Rossetti, PA-C [place],  [place] [id number]  Chief Complaint  Patient presents with  . Recurrent UTI   Urological history 1. rUTI's - contributing factors of age, vaginal atrophy, incontinence and IBS (constipation/diarrhea) - documented positive urine cultures  On August 04, 2020, E. Coli resistant to ampicillin and trimethoprim/sulfa treated with nitrofurantoin 100 mg, BID x 5 days  On July 03, 2021 E. Coli resistant to ampicillin and trimethoprim/sulfa treated with nitrofurantoin 100 mg, BID x 5 days  On June 28, 2021 E. Coli resistant to ampicillin, ampicillin/sulbactam and trimethoprim/sulfa treated with nitrofurantoin 100 mg, twice daily x5 days  2. Urge incontinence - managed with Myrbetriq 50 mg daily and incontinence pads - PVR 100 mL  3. Nocturia - was compliant with CPAP until a few weeks ago     HPI: Victoria Austin is a 70 y.o. female who presents today for rUTI's.  She states that she has been battling with a UTI since November 2021.  She states she has been on 4 rounds of antibiotic without relief of her symptoms.  Her symptoms consist of hot urine, malodorous urine, nausea, low fevers, headaches and lower back pain.    Patient denies any modifying or aggravating factors.  Patient denies any gross hematuria, dysuria or suprapubic/flank pain.  Patient denies any fevers, chills, nausea or vomiting.   She also states her OAB symptoms of frequency, urgency and urge incontinence have worsened since her UTIs.  She states the Myrbetriq is no longer effective.  Cath UA is nitrite positive, greater than 30 WBCs and many bacteria   PMH: Past Medical History:  Diagnosis Date  . Anemia    vitamin d deficiency  . Anxiety    ER visit on 10/15/19 for panic attack, sob, shaking  . Arthritis    per patient she does not have RA, has osteoarthritis  .  Bipolar 1 disorder ([place]) 2006  . Chronic kidney disease    stage III  . Depression   . Diabetes mellitus without complication ([place])    on insulin.  . Fibromyalgia   . GERD (gastroesophageal reflux disease)   . Hereditary essential tremor   . History of hiatal hernia   . Hodgkin's lymphoma ([place]) 2014   SKIN CANCER ALSO  . Hyperlipidemia   . Hypertension   . IBS (irritable bowel syndrome)   . IBS (irritable bowel syndrome)   . Incontinence   . Neuropathy   . Osteopenia   . RA (rheumatoid arthritis) ([place])   . Scoliosis   . Sleep apnea    uses cpap, per patient does not use cpap at all.  . Stroke ([place]) 2019   several tia's prior to stroke.  on plavix  . Temporal arteritis [facility])     Surgical History: Past Surgical History:  Procedure Laterality Date  . BACK SURGERY     10/2019, 10/2018  . CESAREAN SECTION    . ELBOW ARTHROSCOPY    . FINGER SURGERY    . HAND SURGERY    . HERNIA REPAIR     UMBILICAL REPAIR  . LAMINECTOMY WITH POSTERIOR LATERAL ARTHRODESIS LEVEL 4 N/A 11/03/2019   Procedure: T11-L3 POSTERIOR FUSION, T12-L2 POSTERIOR COLUMN OSTEOTOMIES;  Surgeon: Meade Maw, MD;  Location: [facility] ORS;  Service: Neurosurgery;  Laterality: N/A;  . LAMINECTOMY WITH POSTERIOR LATERAL ARTHRODESIS LEVEL 4 N/A 07/05/2020   Procedure: T11-L4 POSTERIOR SPINAL FUSION;  Surgeon:  Meade Maw, MD;  Location: [facility] ORS;  Service: Neurosurgery;  Laterality: N/A;  . SPINE SURGERY      Home Medications:  Allergies as of 08/31/2020      Reactions   Bee Venom Anaphylaxis   Fire Ant Anaphylaxis   Other Anaphylaxis   Green peppers   Ciprofloxacin Nausea And Vomiting   Cyclobenzaprine Nausea And Vomiting   Diazepam Nausea And Vomiting   Ketorolac    HALLUCINATIONS   Ondansetron Nausea And Vomiting   ABRUPT VOMITTING   Ranitidine Hcl    FEELS LIKE BUGS CRAWLING ALL OVER HER   Ambien [zolpidem] Other (See Comments)   Severely sedating   Bacitracin-polymyxin B    BURNS SKIN    Mushroom Extract Complex Nausea And Vomiting   Neomycin-polymyxin-gramicidin Rash   BURNS SKIN      Medication List       Accurate as of August 31, 2020 11:59 PM. If you have any questions, ask your nurse or doctor.        acetaminophen 500 MG tablet Commonly known as: TYLENOL Take 1,000 mg by mouth every 6 (six) hours as needed for headache.   albuterol 108 (90 Base) MCG/ACT inhaler Commonly known as: ProAir HFA Inhale 1 puff into the lungs 2 (two) times daily as needed. What changed: when to take this   alendronate 70 MG tablet Commonly known as: FOSAMAX Take by mouth.   amLODipine 10 MG tablet Commonly known as: NORVASC Take 1 tablet (10 mg total) by mouth daily.   B-12 Compliance Injection 1000 MCG/ML Kit Generic drug: Cyanocobalamin Inject 1,000 mcg as directed every 30 (thirty) days.   bisacodyl 5 MG EC tablet Commonly known as: DULCOLAX Take 5 mg by mouth daily as needed for moderate constipation.   carbamazepine 200 MG tablet Commonly known as: TEGRETOL Take 400 mg by mouth 3 (three) times daily. Brianne Klinger,OIC   clopidogrel 75 MG tablet Commonly known as: PLAVIX Take 1 tablet (75 mg total) by mouth daily.   docusate sodium 100 MG capsule Commonly known as: COLACE Take 100 mg by mouth every other day.   DULoxetine 60 MG capsule Commonly known as: CYMBALTA Take 60 mg by mouth daily. What changed: Another medication with the same name was removed. Continue taking this medication, and follow the directions you see here. Changed by: Zara Council, PA-C   empagliflozin 25 MG Tabs tablet Commonly known as: JARDIANCE Take 25 mg by mouth daily.   EPINEPHrine 0.3 mg/0.3 mL Soaj injection Commonly known as: EpiPen 2-Pak Inject 0.3 mLs (0.3 mg total) into the muscle as needed for anaphylaxis. Then call 911 for ER transport.   estradiol 0.1 MG/GM vaginal cream Commonly known as: ESTRACE VAGINAL Apply 0.31m (pea-sized amount)  just inside the  vaginal introitus with a finger-tip on Monday, Wednesday and Friday nights. Started by: SZara Council PA-C   ferrous sulfate 325 (65 FE) MG tablet Take 325 mg by mouth daily.   fluticasone-salmeterol 230-21 MCG/ACT inhaler Commonly known as: ADVAIR HFA Inhale 2 puffs into the lungs 2 (two) times daily.   gabapentin 300 MG capsule Commonly known as: NEURONTIN Take 1 capsule (300 mg total) by mouth at bedtime.   gemfibrozil 600 MG tablet Commonly known as: LOPID Take 600 mg by mouth 2 (two) times daily.   insulin glargine 100 UNIT/ML Solostar Pen Commonly known as: LANTUS Inject 80-90 Units into the skin at bedtime.   lisinopril 20 MG tablet Commonly known as: ZESTRIL Take 20 mg by mouth  daily.   loratadine 10 MG tablet Commonly known as: CLARITIN Take 1 tablet (10 mg total) by mouth daily. What changed:   when to take this  reasons to take this   LORazepam 1 MG tablet Commonly known as: ATIVAN Take 1 mg by mouth See admin instructions. Take 1 mg at night, may take a second 1 mg dose during the day as needed for anxiety   magnesium oxide 400 (241.3 Mg) MG tablet Commonly known as: MAG-OX 1 tablet with food   meclizine 25 MG tablet Commonly known as: ANTIVERT Take 1 tablet (25 mg total) by mouth daily as needed for dizziness. At most once daily , must last 90 days What changed:   when to take this  additional instructions   methocarbamol 500 MG tablet Commonly known as: ROBAXIN Take 1 tablet (500 mg total) by mouth every 6 (six) hours as needed.   mirabegron ER 25 MG Tb24 tablet Commonly known as: MYRBETRIQ 1 tablet What changed: Another medication with the same name was removed. Continue taking this medication, and follow the directions you see here. Changed by: Lea Baine, PA-C   montelukast 10 MG tablet Commonly known as: SINGULAIR Take 10 mg by mouth daily as needed (allergies).   NovoLOG FlexPen 100 UNIT/ML FlexPen Generic drug: insulin  aspart Inject 22-34 Units into the skin 3 (three) times daily. With meals What changed:   how much to take  when to take this  additional instructions   NovoLOG Mix 70/30 FlexPen (70-30) 100 UNIT/ML FlexPen Generic drug: insulin aspart protamine - aspart See admin instructions.   oxyCODONE 5 MG immediate release tablet Commonly known as: Oxy IR/ROXICODONE Take 1 tablet (5 mg total) by mouth every 4 (four) hours as needed for moderate pain (take 1 tablet for 4-6/10 pain, 2 tablets for 7-10/10).   pantoprazole 40 MG tablet Commonly known as: PROTONIX Take 1 tablet (40 mg total) by mouth daily.   primidone 250 MG tablet Commonly known as: MYSOLINE Take 250 mg by mouth at bedtime.   promethazine 25 MG tablet Commonly known as: PHENERGAN Take 1 tablet (25 mg total) by mouth every 6 (six) hours as needed. What changed: reasons to take this   propranolol 10 MG tablet Commonly known as: INDERAL Take 1 tablet (10 mg total) by mouth 2 (two) times daily. What changed: when to take this   rosuvastatin 20 MG tablet Commonly known as: Crestor Take 1 tablet (20 mg total) by mouth daily.   Simethicone 125 MG Caps Take 125 mg by mouth daily as needed (gas).   trimethoprim 100 MG tablet Commonly known as: TRIMPEX Take 1 tablet (100 mg total) by mouth daily. What changed: when to take this       Allergies:  Allergies  Allergen Reactions  . Bee Venom Anaphylaxis  . Fire [employer] Anaphylaxis  . Other Anaphylaxis    Green peppers  . Ciprofloxacin Nausea And Vomiting  . Cyclobenzaprine Nausea And Vomiting  . Diazepam Nausea And Vomiting  . Ketorolac     HALLUCINATIONS  . Ondansetron Nausea And Vomiting    ABRUPT VOMITTING  . Ranitidine Hcl     FEELS LIKE BUGS CRAWLING ALL OVER HER  . Ambien [Zolpidem] Other (See Comments)    Severely sedating  . Bacitracin-Polymyxin B     BURNS SKIN  . Mushroom Extract Complex Nausea And Vomiting  . Neomycin-Polymyxin-Gramicidin Rash     BURNS SKIN    Family History: Family History  Problem Relation Age  of Onset  . Lung cancer Mother   . Hypertension Mother   . Hypertension Sister   . Hypertension Brother   . Bone cancer Maternal Aunt   . Colon cancer Maternal Uncle     Social History:  reports that she has never smoked. She has never used smokeless tobacco. She reports that she does not drink alcohol and does not use drugs.  ROS: Pertinent ROS in HPI  Physical Exam: BP 117/75   Pulse 78   Temp 97.8 F (36.6 C)   Ht 5' 4"  (1.626 m)   Wt 180 lb (81.6 kg)   BMI 30.90 kg/m   Constitutional:  Well nourished. Alert and oriented, No acute distress. HEENT: [place] AT, mask in place.  Trachea midline Cardiovascular: No clubbing, cyanosis, or edema. Respiratory: Normal respiratory effort, no increased work of breathing. GU: No CVA tenderness.  No bladder fullness or masses.  Atrophic external genitalia, sparse pubic hair distribution, no lesions.  Normal urethral meatus, no lesions, no prolapse, no discharge.   No urethral masses, tenderness and/or tenderness. No bladder fullness, tenderness or masses. Pale vagina mucosa, poor estrogen effect, no discharge, no lesions.  Patient states she needs to be examined with a pediatric speculum and performing self cath was very painful, so pelvic exam was not performed. Anus and perineum are without rashes or lesions.    Neurologic: Grossly intact, no focal deficits, moving all 4 extremities. Psychiatric: Normal mood and affect.   Laboratory Data: Lab Results  Component Value Date   WBC 5.6 07/09/2020   HGB 7.3 (L) 07/09/2020   HCT 22.3 (L) 07/09/2020   MCV 92.9 07/09/2020   PLT 222 07/09/2020    Lab Results  Component Value Date   CREATININE 0.74 07/09/2020    Lab Results  Component Value Date   HGBA1C 6.9 (H) 11/04/2019    Lab Results  Component Value Date   AST 15 12/30/2019   Lab Results  Component Value Date   ALT 7 12/30/2019    Urinalysis Component      Latest Ref Rng & Units 08/31/2020  Specific Gravity, UA     1.005 - 1.030 1.015  pH, UA     5.0 - 7.5 5.5  Color, UA     Yellow Yellow  Appearance Ur     Clear Cloudy (A)  Leukocytes,UA     Negative 1+ (A)  Protein,UA     Negative/Trace 2+ (A)  Glucose, UA     Negative 2+ (A)  Ketones, UA     Negative Negative  RBC, UA     Negative Negative  Bilirubin, UA     Negative Negative  Urobilinogen, Ur     0.2 - 1.0 mg/dL 0.2  Nitrite, UA     Negative Positive (A)  Microscopic Examination      See below:   Component     Latest Ref Rng & Units 08/31/2020          WBC, UA     0 - 5 /hpf >30 (A)  RBC     0 - 2 /hpf 0-2  Epithelial Cells (non renal)     0 - 10 /hpf 0-10  Bacteria, UA     None seen/Few Many (A)  I have reviewed the labs.   Pertinent Imaging: No recent imaging   In and Out Catheterization Patient is present today for a I & O catheterization due to rUTI's. Patient was cleaned and prepped in a sterile fashion with  betadine.  A 14 FR cath was inserted no complications were noted , 100 ml of urine return was noted, urine was dark yellow in color. A clean urine sample was collected for UA and urine culture.  Bladder was drained  And catheter was removed with out difficulty.    Performed by: Myself and Kerman Passey, CMA  Assessment & Plan:    1. rUTI's I expressed my concerns that her recurrent positive urine cultures were not the results of urinary tract infections but likely she is colonized as her symptoms did not resolve with antibiotics and she did not have symptoms of a urinary tract infection. I will send her urine for culture in case she does develop UTI symptoms in the future I will order a renal ultrasound for further evaluation as her urinary symptoms are very bothersome and have not responded to treatment  2. Vaginal atrophy Will start patient on external vaginal estrogen cream on Monday, Wednesday and Friday nights  Return for Patient would  like to keep follow up appointment with Dr. Diamantina Providence on the 10th .  These notes generated with voice recognition software. I apologize for typographical errors.  Zara Council, PA-C  [facility] [address]  [place], [place] [address] [phone]

## 2020-09-03 ENCOUNTER — Encounter: Payer: Self-pay | Admitting: Urology

## 2020-09-04 ENCOUNTER — Telehealth: Payer: Self-pay | Admitting: Family Medicine

## 2020-09-04 LAB — CULTURE, URINE COMPREHENSIVE

## 2020-09-04 NOTE — Telephone Encounter (Signed)
-----   Message from Nori Riis, PA-C sent at 09/04/2020  8:04 AM EST ----- Please let Victoria Austin know that her urine culture grew out a bacteria, but as we discussed during her office visit, she may be colonized.  If she is not having any UTI symptoms at this time, we should not start an antibiotic.

## 2020-09-04 NOTE — Telephone Encounter (Signed)
Patient notified and voiced underrstanding. Patient states she is not having UTI symptoms. Only the odor that was discussed.

## 2020-09-07 ENCOUNTER — Ambulatory Visit: Payer: Medicare Other | Admitting: Urology

## 2020-09-19 ENCOUNTER — Other Ambulatory Visit: Payer: Self-pay

## 2020-09-19 ENCOUNTER — Ambulatory Visit
Admission: RE | Admit: 2020-09-19 | Discharge: 2020-09-19 | Disposition: A | Discharge status: Home / Self Care | Payer: Medicare Other | Source: Ambulatory Visit | Attending: Urology | Admitting: Urology

## 2020-09-19 DIAGNOSIS — N390 Urinary tract infection, site not specified: Secondary | ICD-10-CM | POA: Diagnosis present

## 2020-09-21 ENCOUNTER — Ambulatory Visit: Payer: Self-pay | Admitting: Urology

## 2020-09-22 ENCOUNTER — Ambulatory Visit: Payer: Medicare Other | Admitting: Cardiology

## 2020-10-06 ENCOUNTER — Encounter: Payer: Self-pay | Admitting: Cardiology

## 2020-10-06 ENCOUNTER — Other Ambulatory Visit: Payer: Self-pay

## 2020-10-06 ENCOUNTER — Ambulatory Visit (INDEPENDENT_AMBULATORY_CARE_PROVIDER_SITE_OTHER): Payer: Medicare Other | Admitting: Cardiology

## 2020-10-06 VITALS — BP 136/80 | HR 83 | Ht 64.0 in | Wt 190.0 lb

## 2020-10-06 DIAGNOSIS — I10 Essential (primary) hypertension: Secondary | ICD-10-CM | POA: Diagnosis not present

## 2020-10-06 DIAGNOSIS — E7800 Pure hypercholesterolemia, unspecified: Secondary | ICD-10-CM

## 2020-10-06 DIAGNOSIS — I639 Cerebral infarction, unspecified: Secondary | ICD-10-CM

## 2020-10-06 NOTE — Progress Notes (Signed)
Cardiology Office Note:    Date:  10/06/2020   ID:  Victoria Austin, DOB 1951-04-01, MRN [id number]  PCP:  Donnamarie Rossetti, PA-C  Cardiologist:  Kate Sable, MD  Electrophysiologist:  None   Referring MD: Donnamarie Rossetti,*   Chief Complaint  Patient presents with  . Other    3 month follow up - Meds reviewed verbally with patient.     History of Present Illness:   Victoria Austin is a 70 y.o. female with a hx of hypertension, diabetes, hyperlipidemia, CVA 2019, Hodgkin's lymphoma, chronic back pain who presents for follow-up.     Patient being seen for hypertension, hyperlipidemia management.  Clonidine hydralazine and nifedipine was previously stopped.  Blood pressures seem controlled on lisinopril and amlodipine.  Repeat fasting lipid profile was ordered, patient did not obtain lipid panel.  She takes Crestor and gemfibrozil as prescribed.  She complains of left wrist pain which swells at times.  Has appointment with orthopedic surgery today due to chronic back pain.  Plans to follow-up with them regarding onset left wrist pain.  States having a fracture in the left wrist a long time ago.  Not sure if this has any relation with that.    Prior notes She has a history of Hodgkin's lymphoma status post chemotherapy 6 cycles finished in 2014.  Has stayed in remission since then. Lexiscan Myoview on 04/2019 showed normal systolic function, no evidence for ischemia, low risk study.  Past Medical History:  Diagnosis Date  . Anemia    vitamin d deficiency  . Anxiety    ER visit on 10/15/19 for panic attack, sob, shaking  . Arthritis    per patient she does not have RA, has osteoarthritis  . Bipolar 1 disorder ([place]) 2006  . Chronic kidney disease    stage III  . Depression   . Diabetes mellitus without complication ([place])    on insulin.  . Fibromyalgia   . GERD (gastroesophageal reflux disease)   . Hereditary essential tremor   . History of hiatal hernia   .  Hodgkin's lymphoma ([place]) 2014   SKIN CANCER ALSO  . Hyperlipidemia   . Hypertension   . IBS (irritable bowel syndrome)   . IBS (irritable bowel syndrome)   . Incontinence   . Neuropathy   . Osteopenia   . RA (rheumatoid arthritis) ([place])   . Scoliosis   . Sleep apnea    uses cpap, per patient does not use cpap at all.  . Stroke ([place]) 2019   several tia's prior to stroke.  on plavix  . Temporal arteritis [facility])     Past Surgical History:  Procedure Laterality Date  . BACK SURGERY     10/2019, 10/2018  . CESAREAN SECTION    . ELBOW ARTHROSCOPY    . FINGER SURGERY    . HAND SURGERY    . HERNIA REPAIR     UMBILICAL REPAIR  . LAMINECTOMY WITH POSTERIOR LATERAL ARTHRODESIS LEVEL 4 N/A 11/03/2019   Procedure: T11-L3 POSTERIOR FUSION, T12-L2 POSTERIOR COLUMN OSTEOTOMIES;  Surgeon: Meade Maw, MD;  Location: [facility] ORS;  Service: Neurosurgery;  Laterality: N/A;  . LAMINECTOMY WITH POSTERIOR LATERAL ARTHRODESIS LEVEL 4 N/A 07/05/2020   Procedure: T11-L4 POSTERIOR SPINAL FUSION;  Surgeon: Meade Maw, MD;  Location: [facility] ORS;  Service: Neurosurgery;  Laterality: N/A;  . SPINE SURGERY      Current Medications: Current Meds  Medication Sig  . acetaminophen (TYLENOL) 500 MG tablet Take 1,000 mg by mouth every  6 (six) hours as needed for headache.  . albuterol (PROAIR HFA) 108 (90 Base) MCG/ACT inhaler Inhale 1 puff into the lungs 2 (two) times daily as needed.  Marland Kitchen alendronate (FOSAMAX) 70 MG tablet Take by mouth.  Marland Kitchen amLODipine (NORVASC) 10 MG tablet Take 1 tablet (10 mg total) by mouth daily.  . bisacodyl (DULCOLAX) 5 MG EC tablet Take 5 mg by mouth daily as needed for moderate constipation.   . carbamazepine (TEGRETOL) 200 MG tablet Take 400 mg by mouth 3 (three) times daily. Brianne Klinger,OIC   . clopidogrel (PLAVIX) 75 MG tablet Take 1 tablet (75 mg total) by mouth daily.  . Cyanocobalamin (B-12 COMPLIANCE INJECTION) 1000 MCG/ML KIT Inject 1,000 mcg as directed every 30  (thirty) days.  Marland Kitchen docusate sodium (COLACE) 100 MG capsule Take 100 mg by mouth every other day.   . DULoxetine (CYMBALTA) 60 MG capsule Take 120 mg by mouth daily.  . empagliflozin (JARDIANCE) 25 MG TABS tablet Take 25 mg by mouth daily.   Marland Kitchen EPINEPHrine (EPIPEN 2-PAK) 0.3 mg/0.3 mL IJ SOAJ injection Inject 0.3 mLs (0.3 mg total) into the muscle as needed for anaphylaxis. Then call 911 for ER transport.  Marland Kitchen estradiol (ESTRACE VAGINAL) 0.1 MG/GM vaginal cream Apply 0.80m (pea-sized amount)  just inside the vaginal introitus with a finger-tip on Monday, Wednesday and Friday nights.  . ferrous sulfate 325 (65 FE) MG tablet Take 325 mg by mouth daily.   . fluticasone-salmeterol (ADVAIR HFA) 230-21 MCG/ACT inhaler Inhale 2 puffs into the lungs 2 (two) times daily.  .Marland Kitchengabapentin (NEURONTIN) 300 MG capsule Take 1 capsule (300 mg total) by mouth at bedtime.  .Marland Kitchengemfibrozil (LOPID) 600 MG tablet Take 600 mg by mouth 2 (two) times daily.  . insulin aspart protamine - aspart (NOVOLOG MIX 70/30 FLEXPEN) (70-30) 100 UNIT/ML FlexPen See admin instructions.  . insulin glargine (LANTUS) 100 UNIT/ML Solostar Pen Inject 80-90 Units into the skin at bedtime.   .Marland Kitchenlisinopril (ZESTRIL) 20 MG tablet Take 20 mg by mouth daily.   .Marland Kitchenloratadine (CLARITIN) 10 MG tablet Take 1 tablet (10 mg total) by mouth daily.  .Marland KitchenLORazepam (ATIVAN) 1 MG tablet Take 1 mg by mouth See admin instructions. Take 1 mg at night, may take a second 1 mg dose during the day as needed for anxiety  . mirabegron ER (MYRBETRIQ) 25 MG TB24 tablet 1 tablet  . montelukast (SINGULAIR) 10 MG tablet Take 10 mg by mouth daily as needed (allergies).  . NOVOLOG FLEXPEN 100 UNIT/ML FlexPen Inject 22-34 Units into the skin 3 (three) times daily. With meals  . oxyCODONE (OXY IR/ROXICODONE) 5 MG immediate release tablet Take 1 tablet (5 mg total) by mouth every 4 (four) hours as needed for moderate pain (take 1 tablet for 4-6/10 pain, 2 tablets for 7-10/10).  .  pantoprazole (PROTONIX) 40 MG tablet Take 1 tablet (40 mg total) by mouth daily.  . primidone (MYSOLINE) 250 MG tablet Take 250 mg by mouth at bedtime.   . promethazine (PHENERGAN) 25 MG tablet Take 1 tablet (25 mg total) by mouth every 6 (six) hours as needed.  . propranolol (INDERAL) 10 MG tablet Take 1 tablet (10 mg total) by mouth 2 (two) times daily.  . rosuvastatin (CRESTOR) 20 MG tablet Take 1 tablet (20 mg total) by mouth daily.  . Simethicone 125 MG CAPS Take 125 mg by mouth daily as needed (gas).    Current Facility-Administered Medications for the 10/06/20 encounter (Office Visit) with AKate Sable  MD  Medication  . cyanocobalamin ((VITAMIN B-12)) injection 1,000 mcg     Allergies:   Bee venom, Fire ant, Other, Ciprofloxacin, Cyclobenzaprine, Diazepam, Ketorolac, Ondansetron, Ranitidine hcl, Ambien [zolpidem], Bacitracin-polymyxin b, Mushroom extract complex, and Neomycin-polymyxin-gramicidin   Social History   Socioeconomic History  . Marital status: Legally Separated    Spouse name: Not on file  . Number of children: 2  . Years of education: Not on file  . Highest education level: Not on file  Occupational History  . Occupation: retired  Tobacco Use  . Smoking status: Never Smoker  . Smokeless tobacco: Never Used  Vaping Use  . Vaping Use: Never used  Substance and Sexual Activity  . Alcohol use: Never  . Drug use: Never  . Sexual activity: Not Currently    Partners: Male  Other Topics Concern  . Not on file  Social History Narrative   Patient lives with daughter and 34 year old grandson. Patient states her grandson has become aggressive and has behavioral issues that she is concerned about. She also stated her granddaughter has recently been arrested for felony child abuse and she is at her breaking point.    Social Determinants of Health   Financial Resource Strain: Not on file  Food Insecurity: Not on file  Transportation Needs: Not on file  Physical  Activity: Not on file  Stress: Not on file  Social Connections: Not on file     Family History: The patient's family history includes Bone cancer in her maternal aunt; Colon cancer in her maternal uncle; Hypertension in her brother, mother, and sister; Lung cancer in her mother.  ROS:   Please see the history of present illness.     All other systems reviewed and are negative.  EKGs/Labs/Other Studies Reviewed:    The following studies were reviewed today:   EKG:  EKG is  ordered today.  The ekg ordered today demonstrates normal sinus rhythm  Recent Labs: 12/30/2019: ALT 7 07/09/2020: BUN 19; Creatinine, Ser 0.74; Hemoglobin 7.3; Platelets 222; Potassium 4.5; Sodium 134  Recent Lipid Panel    Component Value Date/Time   CHOL 186 03/01/2019 1140   TRIG 413 (H) 03/01/2019 1140   HDL 39 (L) 03/01/2019 1140   CHOLHDL 4.8 03/01/2019 1140   LDLCALC  03/01/2019 1140     Comment:     . LDL cholesterol not calculated. Triglyceride levels greater than 400 mg/dL invalidate calculated LDL results. . Reference range: <100 . Desirable range <100 mg/dL for primary prevention;   <70 mg/dL for patients with CHD or diabetic patients  with > or = 2 CHD risk factors. Marland Kitchen LDL-C is now calculated using the Martin-Hopkins  calculation, which is a validated novel method providing  better accuracy than the Friedewald equation in the  estimation of LDL-C.  Cresenciano Genre et al. Annamaria Helling. 6720;947(09): 2061-2068  (http://education.[employer].com/faq/FAQ164)     Physical Exam:    VS:  BP 136/80 (BP Location: Left Arm, Patient Position: Sitting, Cuff Size: Normal)   Pulse 83   Ht 5' 4"  (1.626 m)   Wt 190 lb (86.2 kg)   SpO2 94%   BMI 32.61 kg/m     Wt Readings from Last 3 Encounters:  10/06/20 190 lb (86.2 kg)  08/31/20 180 lb (81.6 kg)  07/05/20 181 lb 7 oz (82.3 kg)     GEN:  Well nourished, well developed in no acute distress HEENT: Normal NECK: No JVD; No carotid  bruits LYMPHATICS: No lymphadenopathy CARDIAC: RRR, no  murmurs, rubs, gallops RESPIRATORY:  Clear to auscultation without rales, wheezing or rhonchi  ABDOMEN: Soft, non-tender, non-distended MUSCULOSKELETAL:  No edema; No deformity  SKIN: Warm and dry NEUROLOGIC:  Alert and oriented x 3 PSYCHIATRIC:  Normal affect   ASSESSMENT:    1. Primary hypertension   2. Pure hypercholesterolemia   3. Cerebrovascular accident (CVA), unspecified mechanism ([place])    PLAN:    In order of problems listed above:  1. History of hypertension, BP controlled.  Continue amlodipine, lisinopril. 2. History of hyperlipidemia.  Continue Crestor, gemfibrozil.  Obtain fasting lipid profile. 3. History of CVA, continue Plavix, statin.  Follow-up in 6 months.  Medication Adjustments/Labs and Tests Ordered: Current medicines are reviewed at length with the patient today.  Concerns regarding medicines are outlined above.  Orders Placed This Encounter  Procedures  . Lipid panel  . EKG 12-Lead   No orders of the defined types were placed in this encounter.   Patient Instructions  Medication Instructions:  Your physician recommends that you continue on your current medications as directed. Please refer to the Current Medication list given to you today.  *If you need a refill on your cardiac medications before your next appointment, please call your pharmacy*   Lab Work: Your physician recommends that you return for a FASTING lipid profile: at your convenience. - You will need to be fasting. Please do not have anything to eat or drink after midnight the morning you have the lab work. You may only have water or black coffee with no cream or sugar. - Please go to the [facility]. You will check in at the front desk to the right as you walk into the atrium. Valet Parking is offered if needed. - No appointment needed. You may go any day between 7 am and 6 pm.    Testing/Procedures: None  ordered   Follow-Up: At [facility], you and your health needs are our priority.  As part of our continuing mission to provide you with exceptional heart care, we have created designated Provider Care Teams.  These Care Teams include your primary Cardiologist (physician) and Advanced Practice Providers (APPs -  Physician Assistants and Nurse Practitioners) who all work together to provide you with the care you need, when you need it.  We recommend signing up for the patient portal called "MyChart".  Sign up information is provided on this After Visit Summary.  MyChart is used to connect with patients for Virtual Visits (Telemedicine).  Patients are able to view lab/test results, encounter notes, upcoming appointments, etc.  Non-urgent messages can be sent to your provider as well.   To learn more about what you can do with MyChart, go to [email or web address].    Your next appointment:   6 month(s)  The format for your next appointment:   In Person  Provider:   Kate Sable, MD   Other Instructions      Signed, Kate Sable, MD  10/06/2020 12:28 PM    [place]

## 2020-10-06 NOTE — Patient Instructions (Signed)
Medication Instructions:  Your physician recommends that you continue on your current medications as directed. Please refer to the Current Medication list given to you today.  *If you need a refill on your cardiac medications before your next appointment, please call your pharmacy*   Lab Work:  Your physician recommends that you return for a FASTING lipid profile: at your convenience.  - You will need to be fasting. Please do not have anything to eat or drink after midnight the morning you have the lab work. You may only have water or black coffee with no cream or sugar. - Please go to the [facility]. You will check in at the front desk to the right as you walk into the atrium. Valet Parking is offered if needed. - No appointment needed. You may go any day between 7 am and 6 pm.     Testing/Procedures: None ordered   Follow-Up: At [facility], you and your health needs are our priority.  As part of our continuing mission to provide you with exceptional heart care, we have created designated Provider Care Teams.  These Care Teams include your primary Cardiologist (physician) and Advanced Practice Providers (APPs -  Physician Assistants and Nurse Practitioners) who all work together to provide you with the care you need, when you need it.  We recommend signing up for the patient portal called "MyChart".  Sign up information is provided on this After Visit Summary.  MyChart is used to connect with patients for Virtual Visits (Telemedicine).  Patients are able to view lab/test results, encounter notes, upcoming appointments, etc.  Non-urgent messages can be sent to your provider as well.   To learn more about what you can do with MyChart, go to [email or web address].    Your next appointment:   6 month(s)  The format for your next appointment:   In Person  Provider:   Brian Agbor-Etang, MD   Other Instructions    

## 2020-10-10 ENCOUNTER — Other Ambulatory Visit
Admission: RE | Admit: 2020-10-10 | Discharge: 2020-10-10 | Disposition: A | Discharge status: Home / Self Care | Payer: Medicare Other | Attending: Cardiology | Admitting: Cardiology

## 2020-10-10 DIAGNOSIS — E7800 Pure hypercholesterolemia, unspecified: Secondary | ICD-10-CM | POA: Diagnosis present

## 2020-10-10 LAB — LIPID PANEL
Cholesterol: 164 mg/dL (ref 0–200)
HDL: 41 mg/dL (ref >40)
LDL Cholesterol: 90 mg/dL (ref 0–99)
Total CHOL/HDL Ratio: 4 RATIO
Triglycerides: 164 mg/dL — ABNORMAL HIGH (ref <150)
VLDL: 33 mg/dL (ref 0–40)

## 2020-10-16 ENCOUNTER — Inpatient Hospital Stay: Payer: Medicare Other

## 2020-10-17 ENCOUNTER — Other Ambulatory Visit: Payer: Self-pay

## 2020-10-17 ENCOUNTER — Inpatient Hospital Stay: Payer: Medicare Other | Attending: Oncology

## 2020-10-17 DIAGNOSIS — Z8249 Family history of ischemic heart disease and other diseases of the circulatory system: Secondary | ICD-10-CM | POA: Diagnosis not present

## 2020-10-17 DIAGNOSIS — N3289 Other specified disorders of bladder: Secondary | ICD-10-CM | POA: Insufficient documentation

## 2020-10-17 DIAGNOSIS — Z800 Family history of malignant neoplasm of digestive organs: Secondary | ICD-10-CM | POA: Diagnosis not present

## 2020-10-17 DIAGNOSIS — Z801 Family history of malignant neoplasm of trachea, bronchus and lung: Secondary | ICD-10-CM | POA: Insufficient documentation

## 2020-10-17 DIAGNOSIS — Z9221 Personal history of antineoplastic chemotherapy: Secondary | ICD-10-CM | POA: Insufficient documentation

## 2020-10-17 DIAGNOSIS — N390 Urinary tract infection, site not specified: Secondary | ICD-10-CM | POA: Insufficient documentation

## 2020-10-17 DIAGNOSIS — M5136 Other intervertebral disc degeneration, lumbar region: Secondary | ICD-10-CM | POA: Insufficient documentation

## 2020-10-17 DIAGNOSIS — M545 Low back pain, unspecified: Secondary | ICD-10-CM | POA: Insufficient documentation

## 2020-10-17 DIAGNOSIS — Z7902 Long term (current) use of antithrombotics/antiplatelets: Secondary | ICD-10-CM | POA: Insufficient documentation

## 2020-10-17 DIAGNOSIS — Z8579 Personal history of other malignant neoplasms of lymphoid, hematopoietic and related tissues: Secondary | ICD-10-CM

## 2020-10-17 DIAGNOSIS — Z8673 Personal history of transient ischemic attack (TIA), and cerebral infarction without residual deficits: Secondary | ICD-10-CM | POA: Diagnosis not present

## 2020-10-17 DIAGNOSIS — Z79899 Other long term (current) drug therapy: Secondary | ICD-10-CM | POA: Insufficient documentation

## 2020-10-17 DIAGNOSIS — Z881 Allergy status to other antibiotic agents status: Secondary | ICD-10-CM | POA: Insufficient documentation

## 2020-10-17 DIAGNOSIS — Z808 Family history of malignant neoplasm of other organs or systems: Secondary | ICD-10-CM | POA: Diagnosis not present

## 2020-10-17 DIAGNOSIS — Z8571 Personal history of Hodgkin lymphoma: Secondary | ICD-10-CM | POA: Diagnosis present

## 2020-10-17 DIAGNOSIS — Z85828 Personal history of other malignant neoplasm of skin: Secondary | ICD-10-CM | POA: Insufficient documentation

## 2020-10-17 DIAGNOSIS — Z862 Personal history of diseases of the blood and blood-forming organs and certain disorders involving the immune mechanism: Secondary | ICD-10-CM | POA: Insufficient documentation

## 2020-10-17 DIAGNOSIS — Z888 Allergy status to other drugs, medicaments and biological substances status: Secondary | ICD-10-CM | POA: Insufficient documentation

## 2020-10-17 DIAGNOSIS — M47819 Spondylosis without myelopathy or radiculopathy, site unspecified: Secondary | ICD-10-CM | POA: Insufficient documentation

## 2020-10-17 DIAGNOSIS — Z91030 Bee allergy status: Secondary | ICD-10-CM | POA: Insufficient documentation

## 2020-10-17 DIAGNOSIS — Z87440 Personal history of urinary (tract) infections: Secondary | ICD-10-CM | POA: Diagnosis not present

## 2020-10-17 DIAGNOSIS — R200 Anesthesia of skin: Secondary | ICD-10-CM | POA: Diagnosis not present

## 2020-10-17 LAB — CBC WITH DIFFERENTIAL/PLATELET
Abs Immature Granulocytes: 0.05 10*3/uL (ref 0.00–0.07)
Basophils Absolute: 0 10*3/uL (ref 0.0–0.1)
Basophils Relative: 1 %
Eosinophils Absolute: 0.2 10*3/uL (ref 0.0–0.5)
Eosinophils Relative: 2 %
HCT: 38.6 % (ref 36.0–46.0)
Hemoglobin: 12.3 g/dL (ref 12.0–15.0)
Immature Granulocytes: 1 %
Lymphocytes Relative: 27 %
Lymphs Abs: 1.8 10*3/uL (ref 0.7–4.0)
MCH: 28.3 pg (ref 26.0–34.0)
MCHC: 31.9 g/dL (ref 30.0–36.0)
MCV: 88.9 fL (ref 80.0–100.0)
Monocytes Absolute: 0.5 10*3/uL (ref 0.1–1.0)
Monocytes Relative: 8 %
Neutro Abs: 4.1 10*3/uL (ref 1.7–7.7)
Neutrophils Relative %: 61 %
Platelets: 262 10*3/uL (ref 150–400)
RBC: 4.34 MIL/uL (ref 3.87–5.11)
RDW: 13.2 % (ref 11.5–15.5)
WBC: 6.6 10*3/uL (ref 4.0–10.5)
nRBC: 0 % (ref 0.0–0.2)

## 2020-10-17 LAB — COMPREHENSIVE METABOLIC PANEL
ALT: 8 U/L (ref 0–44)
AST: 17 U/L (ref 15–41)
Albumin: 4.3 g/dL (ref 3.5–5.0)
Alkaline Phosphatase: 117 U/L (ref 38–126)
Anion gap: 9 (ref 5–15)
BUN: 17 mg/dL (ref 8–23)
CO2: 26 mmol/L (ref 22–32)
Calcium: 9.2 mg/dL (ref 8.9–10.3)
Chloride: 102 mmol/L (ref 98–111)
Creatinine, Ser: 0.88 mg/dL (ref 0.44–1.00)
GFR, Estimated: >60 mL/min (ref >60)
Glucose, Bld: 138 mg/dL — ABNORMAL HIGH (ref 70–99)
Potassium: 4.4 mmol/L (ref 3.5–5.1)
Sodium: 137 mmol/L (ref 135–145)
Total Bilirubin: 0.5 mg/dL (ref 0.3–1.2)
Total Protein: 8 g/dL (ref 6.5–8.1)

## 2020-10-17 LAB — LACTATE DEHYDROGENASE: LDH: 147 U/L (ref 98–192)

## 2020-10-17 LAB — FERRITIN: Ferritin: 24 ng/mL (ref 11–307)

## 2020-10-17 LAB — IRON AND TIBC
Iron: 121 ug/dL (ref 28–170)
Saturation Ratios: 31 % (ref 10.4–31.8)
TIBC: 393 ug/dL (ref 250–450)
UIBC: 272 ug/dL

## 2020-10-18 ENCOUNTER — Inpatient Hospital Stay (HOSPITAL_BASED_OUTPATIENT_CLINIC_OR_DEPARTMENT_OTHER): Payer: Medicare Other | Admitting: Oncology

## 2020-10-18 ENCOUNTER — Encounter: Payer: Self-pay | Admitting: Oncology

## 2020-10-18 VITALS — BP 150/83 | HR 76 | Temp 96.9°F | Resp 18 | Wt 187.7 lb

## 2020-10-18 DIAGNOSIS — Z862 Personal history of diseases of the blood and blood-forming organs and certain disorders involving the immune mechanism: Secondary | ICD-10-CM

## 2020-10-18 DIAGNOSIS — Z8579 Personal history of other malignant neoplasms of lymphoid, hematopoietic and related tissues: Secondary | ICD-10-CM

## 2020-10-18 DIAGNOSIS — Z8571 Personal history of Hodgkin lymphoma: Secondary | ICD-10-CM | POA: Diagnosis not present

## 2020-10-18 NOTE — Progress Notes (Signed)
Patient here for follow up. No new concerns voiced.  °

## 2020-10-18 NOTE — Progress Notes (Signed)
Hematology/Oncology  Follow up note [facility] [phone] [phone]   Patient Care Team: Donnamarie Rossetti, PA-C as PCP - General (Family Medicine) Kate Sable, MD as PCP - Cardiology (Cardiology) Gabriel Carina Betsey Holiday, MD as Physician Assistant (Endocrinology) Meade Maw, MD as Consulting Physician (Neurosurgery) Earlie Server, MD as Consulting Physician (Oncology)  REFERRING PROVIDER: Hubbard Hartshorn, FNP  CHIEF COMPLAINTS/REASON FOR VISIT:  Follow up for history of nodular sclerosing Hodgkin's lymphoma  HISTORY OF PRESENTING ILLNESS:   Victoria Austin is a  70 y.o.  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 Oncology notes and pathology reports were not available to me. Denies weight loss, fever, chills, fatigue, night sweats.   Patient reports being quite stressed recently and depressed.  No thoughts of harming herself. March 2020, patient  L1 burst fracture after mechanical ground-level fall at a doctor's office.  Currently wearing back brace.  # Medical record review was performed by me. 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  Finished in 2014.  Patient has stayed in remission since then  INTERVAL HISTORY Victoria Austin is a 70 y.o. female who has above history reviewed by me today presents for follow up visit for management of history of nodular sclerosing Hodgkin's lymphoma Problems and complaints are listed below: Patient had T11-L4 posterior spinal fusion surgery done in December 2021. Today she reports feeling well.  Denies any constitutional symptoms.  Review of Systems  Constitutional: Negative for appetite change, chills, fatigue and fever.  HENT:   Negative for hearing loss and voice  change.   Eyes: Negative for eye problems.  Respiratory: Negative for chest tightness and cough.   Cardiovascular: Negative for chest pain.  Gastrointestinal: Negative for abdominal distention, abdominal pain and blood in stool.  Endocrine: Negative for hot flashes.  Genitourinary: Negative for difficulty urinating and frequency.   Musculoskeletal: Negative for arthralgias.  Skin: Negative for itching and rash.  Neurological: Negative for extremity weakness.  Hematological: Negative for adenopathy.  Psychiatric/Behavioral: Negative for confusion and depression. The patient is not nervous/anxious.     MEDICAL HISTORY:  Past Medical History:  Diagnosis Date  . Anemia    vitamin d deficiency  . Anxiety    ER visit on 10/15/19 for panic attack, sob, shaking  . Arthritis    per patient she does not have RA, has osteoarthritis  . Bipolar 1 disorder ([place]) 2006  . Chronic kidney disease    stage III  . Depression   . Diabetes mellitus without complication ([place])    on insulin.  . Fibromyalgia   . GERD (gastroesophageal reflux disease)   . Hereditary essential tremor   . History of hiatal hernia   . Hodgkin's lymphoma ([place]) 2014   SKIN CANCER ALSO  . Hyperlipidemia   . Hypertension   . IBS (irritable bowel syndrome)   . IBS (irritable bowel syndrome)   . Incontinence   . Neuropathy   . Osteopenia   . RA (rheumatoid arthritis) ([place])   . Scoliosis   . Sleep apnea    uses cpap, per patient does not use cpap at all.  . Stroke ([place]) 2019   several tia's prior to stroke.  on plavix  . Temporal arteritis ([place])     SURGICAL HISTORY: Past Surgical  History:  Procedure Laterality Date  . BACK SURGERY     10/2019, 10/2018  . CESAREAN SECTION    . ELBOW ARTHROSCOPY    . FINGER SURGERY    . HAND SURGERY    . HERNIA REPAIR     UMBILICAL REPAIR  . LAMINECTOMY WITH POSTERIOR LATERAL ARTHRODESIS LEVEL 4 N/A 11/03/2019   Procedure: T11-L3 POSTERIOR FUSION, T12-L2 POSTERIOR COLUMN  OSTEOTOMIES;  Surgeon: Meade Maw, MD;  Location: [facility] ORS;  Service: Neurosurgery;  Laterality: N/A;  . LAMINECTOMY WITH POSTERIOR LATERAL ARTHRODESIS LEVEL 4 N/A 07/05/2020   Procedure: T11-L4 POSTERIOR SPINAL FUSION;  Surgeon: Meade Maw, MD;  Location: [facility] ORS;  Service: Neurosurgery;  Laterality: N/A;  . SPINE SURGERY      SOCIAL HISTORY: Social History   Socioeconomic History  . Marital status: Legally Separated    Spouse name: Not on file  . Number of children: 2  . Years of education: Not on file  . Highest education level: Not on file  Occupational History  . Occupation: retired  Tobacco Use  . Smoking status: Never Smoker  . Smokeless tobacco: Never Used  Vaping Use  . Vaping Use: Never used  Substance and Sexual Activity  . Alcohol use: Never  . Drug use: Never  . Sexual activity: Not Currently    Partners: Male  Other Topics Concern  . Not on file  Social History Narrative   Patient lives with daughter and 4 year old grandson. Patient states her grandson has become aggressive and has behavioral issues that she is concerned about. She also stated her granddaughter has recently been arrested for felony child abuse and she is at her breaking point.    Social Determinants of Health   Financial Resource Strain: Not on file  Food Insecurity: Not on file  Transportation Needs: Not on file  Physical Activity: Not on file  Stress: Not on file  Social Connections: Not on file  Intimate Partner Violence: Not on file    FAMILY HISTORY: Family History  Problem Relation Age of Onset  . Lung cancer Mother   . Hypertension Mother   . Hypertension Sister   . Hypertension Brother   . Bone cancer Maternal Aunt   . Colon cancer Maternal Uncle     ALLERGIES:  is allergic to bee venom, fire ant, other, ciprofloxacin, cyclobenzaprine, diazepam, ketorolac, ondansetron, ranitidine hcl, ambien [zolpidem], bacitracin-polymyxin b, mushroom extract complex, and  neomycin-polymyxin-gramicidin.  MEDICATIONS:  Current Outpatient Medications  Medication Sig Dispense Refill  . acetaminophen (TYLENOL) 500 MG tablet Take 1,000 mg by mouth every 6 (six) hours as needed for headache.    . albuterol (PROAIR HFA) 108 (90 Base) MCG/ACT inhaler Inhale 1 puff into the lungs 2 (two) times daily as needed. 18 g 1  . alendronate (FOSAMAX) 70 MG tablet Take by mouth.    Marland Kitchen amLODipine (NORVASC) 10 MG tablet Take 1 tablet (10 mg total) by mouth daily. 180 tablet 3  . bisacodyl (DULCOLAX) 5 MG EC tablet Take 5 mg by mouth daily as needed for moderate constipation.     . carbamazepine (TEGRETOL) 200 MG tablet Take 400 mg by mouth 3 (three) times daily. Brianne Klinger,OIC     . clopidogrel (PLAVIX) 75 MG tablet Take 1 tablet (75 mg total) by mouth daily. 90 tablet 3  . Cyanocobalamin (B-12 COMPLIANCE INJECTION) 1000 MCG/ML KIT Inject 1,000 mcg as directed every 30 (thirty) days.    Marland Kitchen docusate sodium (COLACE) 100 MG capsule Take 100 mg  by mouth every other day.     . DULoxetine (CYMBALTA) 60 MG capsule Take 120 mg by mouth daily.    . empagliflozin (JARDIANCE) 25 MG TABS tablet Take 25 mg by mouth daily.     Marland Kitchen estradiol (ESTRACE VAGINAL) 0.1 MG/GM vaginal cream Apply 0.37m (pea-sized amount)  just inside the vaginal introitus with a finger-tip on Monday, Wednesday and Friday nights. 30 g 12  . ferrous sulfate 325 (65 FE) MG tablet Take 325 mg by mouth daily.     . fluticasone-salmeterol (ADVAIR HFA) 230-21 MCG/ACT inhaler Inhale 2 puffs into the lungs 2 (two) times daily.    .Marland Kitchengabapentin (NEURONTIN) 300 MG capsule Take 1 capsule (300 mg total) by mouth at bedtime. 90 capsule 1  . gemfibrozil (LOPID) 600 MG tablet Take 600 mg by mouth 2 (two) times daily.    . insulin aspart protamine - aspart (NOVOLOG MIX 70/30 FLEXPEN) (70-30) 100 UNIT/ML FlexPen See admin instructions.    .Marland Kitchenlisinopril (ZESTRIL) 20 MG tablet Take 20 mg by mouth daily.     .Marland Kitchenloratadine (CLARITIN) 10 MG  tablet Take 1 tablet (10 mg total) by mouth daily. 90 tablet 1  . LORazepam (ATIVAN) 1 MG tablet Take 1 mg by mouth See admin instructions. Take 1 mg at night, may take a second 1 mg dose during the day as needed for anxiety    . mirabegron ER (MYRBETRIQ) 25 MG TB24 tablet 1 tablet    . montelukast (SINGULAIR) 10 MG tablet Take 10 mg by mouth daily as needed (allergies).    . NOVOLOG FLEXPEN 100 UNIT/ML FlexPen Inject 22-34 Units into the skin 3 (three) times daily. With meals 3 pen 3  . oxyCODONE (OXY IR/ROXICODONE) 5 MG immediate release tablet Take 1 tablet (5 mg total) by mouth every 4 (four) hours as needed for moderate pain (take 1 tablet for 4-6/10 pain, 2 tablets for 7-10/10). 40 tablet 0  . pantoprazole (PROTONIX) 40 MG tablet Take 1 tablet (40 mg total) by mouth daily. 90 tablet 1  . primidone (MYSOLINE) 250 MG tablet Take 250 mg by mouth at bedtime.     . promethazine (PHENERGAN) 25 MG tablet Take 1 tablet (25 mg total) by mouth every 6 (six) hours as needed. 30 tablet 1  . propranolol (INDERAL) 10 MG tablet Take 1 tablet (10 mg total) by mouth 2 (two) times daily. 180 tablet 1  . rosuvastatin (CRESTOR) 20 MG tablet Take 1 tablet (20 mg total) by mouth daily. 90 tablet 0  . Simethicone 125 MG CAPS Take 125 mg by mouth daily as needed (gas).     . EPINEPHrine (EPIPEN 2-PAK) 0.3 mg/0.3 mL IJ SOAJ injection Inject 0.3 mLs (0.3 mg total) into the muscle as needed for anaphylaxis. Then call 911 for ER transport. (Patient not taking: Reported on 10/18/2020) 0.3 mL 1  . insulin glargine (LANTUS) 100 UNIT/ML Solostar Pen Inject 80-90 Units into the skin at bedtime.  (Patient not taking: Reported on 10/18/2020)     Current Facility-Administered Medications  Medication Dose Route Frequency Provider Last Rate Last Admin  . cyanocobalamin ((VITAMIN B-12)) injection 1,000 mcg  1,000 mcg Intramuscular Q30 days SSteele Sizer MD   1,000 mcg at 02/02/20 1049     PHYSICAL EXAMINATION:  ECOG  PERFORMANCE STATUS: 1 - Symptomatic but completely ambulatory Vitals:   10/18/20 1058  BP: (!) 150/83  Pulse: 76  Resp: 18  Temp: (!) 96.9 F (36.1 C)   Filed Weights  10/18/20 1058  Weight: 187 lb 11.2 oz (85.1 kg)    Physical Exam Constitutional:      General: She is not in acute distress. HENT:     Head: Normocephalic and atraumatic.  Eyes:     General: No scleral icterus.    Pupils: Pupils are equal, round, and reactive to light.  Cardiovascular:     Rate and Rhythm: Normal rate and regular rhythm.     Heart sounds: Normal heart sounds.  Pulmonary:     Effort: Pulmonary effort is normal. No respiratory distress.     Breath sounds: No wheezing.  Abdominal:     General: Bowel sounds are normal. There is no distension.     Palpations: Abdomen is soft. There is no mass.     Tenderness: There is no abdominal tenderness.  Musculoskeletal:        General: No deformity. Normal range of motion.     Cervical back: Normal range of motion and neck supple.  Skin:    General: Skin is warm and dry.     Findings: No erythema or rash.  Neurological:     Mental Status: She is alert and oriented to person, place, and time.     Cranial Nerves: No cranial nerve deficit.     Coordination: Coordination normal.  Psychiatric:        Mood and Affect: Mood normal.     LABORATORY DATA:  I have reviewed the data as listed Lab Results  Component Value Date   WBC 6.6 10/17/2020   HGB 12.3 10/17/2020   HCT 38.6 10/17/2020   MCV 88.9 10/17/2020   PLT 262 10/17/2020   Recent Labs    10/21/19 1310 10/25/19 0944 12/21/19 1724 12/30/19 1552 03/09/20 1903 03/11/20 1102 05/29/20 1526 07/08/20 0928 07/09/20 0525 10/17/20 1128  NA 137   < > 130* 139 138 135   < > 134* 134* 137  K 4.9   < > 4.7 4.8 3.4* 3.9   < > 4.7 4.5 4.4  CL 106   < > 97* 102 104 103   < > 102 101 102  CO2 23   < > 25 26 22 22    < > 25 25 26   GLUCOSE 197*   < > 80 96 200* 204*   < > 225* 153* 138*  BUN 22   <  > 20 24 18 16    < > 17 19 17   CREATININE 1.03*   < > 0.94 1.01* 1.08* 0.92   < > 0.88 0.74 0.88  CALCIUM 8.5*   < > 8.9 9.5 8.9 8.6*   < > 8.0* 8.2* 9.2  GFRNONAA 56*   < > >60 58* 53* >60   < > >60 >60 >60  GFRAA >60   < > >60 67 >60 >60  --   --   --   --   PROT 7.2  --  7.5 7.4  --   --   --   --   --  8.0  ALBUMIN 4.0  --  4.0  --   --   --   --   --   --  4.3  AST 20  --  16 15  --   --   --   --   --  17  ALT 12  --  9 7  --   --   --   --   --  8  ALKPHOS 110  --  142*  --   --   --   --   --   --  117  BILITOT 0.3  --  0.5 0.2  --   --   --   --   --  0.5   < > = values in this interval not displayed.   Iron/TIBC/Ferritin/ %Sat    Component Value Date/Time   IRON 121 10/17/2020 1128   TIBC 393 10/17/2020 1128   FERRITIN 24 10/17/2020 1128   IRONPCTSAT 31 10/17/2020 1128   IRONPCTSAT 13 (L) 12/10/2019 1528      RADIOGRAPHIC STUDIES: I have personally reviewed the radiological images as listed and agreed with the findings in the report. MR LUMBAR SPINE WO CONTRAST  Result Date: 08/02/2020 CLINICAL DATA:  Low back pain with numbness right hip to knee. Prior back surgery. History of fracture. EXAM: MRI LUMBAR SPINE WITHOUT CONTRAST TECHNIQUE: Multiplanar, multisequence MR imaging of the lumbar spine was performed. No intravenous contrast was administered. COMPARISON:  MRI lumbar spine 05/15/2019. CT myelogram lumbar spine 06/01/2020 FINDINGS: Segmentation:  Normal.  Lowest disc space L5-S1 Alignment:  Normal Vertebrae: Chronic compression fracture L1, moderate to severe and unchanged. Mild retropulsion of L1 into the spinal canal without significant spinal stenosis. No interval change. No acute fracture or mass. Hardware: Pedicle screw and rod fusion extending from T11 through L4. The fusion was extended to L4 following the myelogram of 06/01/2020. Artifact from hardware obscures spinal canal and foramina. Conus medullaris and cauda equina: Conus extends to the L1-2 level. Conus and  cauda equina appear normal. Paraspinal and other soft tissues: Negative for paraspinous mass, adenopathy, fluid collection. Disc levels: T11-12: Negative for stenosis L1-2: Negative for stenosis. L1-2: Mild disc degeneration and spurring on the right. L2-3: Asymmetric disc degeneration with disc bulging and spurring on the right. Bilateral facet hypertrophy. Mild spinal stenosis. Mild to moderate subarticular stenosis bilaterally right greater than left. L3-4: Disc degeneration with diffuse disc bulging and endplate spurring. Bilateral facet hypertrophy. Mild spinal stenosis. Moderate subarticular and foraminal stenosis bilaterally, unchanged. L4-5: Prior laminotomy. Spinal canal adequately decompressed. Mild subarticular stenosis bilaterally L5-S1: Mild disc bulging and mild facet degeneration. Mild subarticular stenosis bilaterally. IMPRESSION: 1. Chronic compression fracture of L1 unchanged.  No acute fracture 2. Pedicle screw fusion T11 through L4. 3. Lumbar spine degenerative changes causing stenosis at L2-3, L3-4, L4-5, L5-S1 unchanged from the prior study. Electronically Signed   By: Franchot Gallo M.D.   On: 08/02/2020 16:11   US RENAL  Result Date: 09/20/2020 CLINICAL DATA:  Recurrent urinary tract infections. EXAM: RENAL / URINARY TRACT ULTRASOUND COMPLETE COMPARISON:  Noncontrast abdominopelvic CT 03/11/2020. FINDINGS: Right Kidney: Renal measurements: 11.2 x 5.8 x 5.9 cm = volume: 199.3 mL. Echogenicity within normal limits. No mass or hydronephrosis visualized. Left Kidney: Renal measurements: 9.9 x 6.5 x 6.0 cm = volume: 200.8 mL. Echogenicity within normal limits. No mass or hydronephrosis visualized. Bladder: Appears normal for degree of bladder distention. Other: None. IMPRESSION: The appearance of both kidneys remains within normal limits. No hydronephrosis. Electronically Signed   By: Richardean Sale M.D.   On: 09/20/2020 14:53      ASSESSMENT & PLAN:  1. History of lymphoma   2.  History of anemia    #History of nodular sclerosing Hodgkin's lymphoma status post chemotherapy. Clinically she is doing well from lymphoma aspect.  No constitutional symptoms. Labs are reviewed and discussed with patient Stable LDH She has been in remission for 7 years, CT images will  be ordered as indicated.  #History of anemia, hemoglobin has resolved.  12.3.  Orders Placed This Encounter  Procedures  . CBC with Differential/Platelet    Standing Status:   Future    Standing Expiration Date:   10/18/2021  . Comprehensive metabolic panel    Standing Status:   Future    Standing Expiration Date:   10/18/2021  . Ferritin    Standing Status:   Future    Standing Expiration Date:   10/18/2021  . Iron and TIBC    Standing Status:   Future    Standing Expiration Date:   10/18/2021  . Lactate dehydrogenase    Standing Status:   Future    Standing Expiration Date:   10/18/2021    All questions were answered. The patient knows to call the clinic with any problems questions or concerns.  cc Hubbard Hartshorn, FNP    Return of visit: 1 year Earlie Server, MD, PhD Hematology Oncology [facility] Pager- [phone] 10/18/2020

## 2020-11-01 ENCOUNTER — Ambulatory Visit (INDEPENDENT_AMBULATORY_CARE_PROVIDER_SITE_OTHER): Payer: Medicare Other | Admitting: Urology

## 2020-11-01 ENCOUNTER — Encounter: Payer: Self-pay | Admitting: Urology

## 2020-11-01 ENCOUNTER — Other Ambulatory Visit: Payer: Self-pay

## 2020-11-01 VITALS — BP 139/80 | HR 76 | Ht 64.0 in | Wt 187.0 lb

## 2020-11-01 DIAGNOSIS — N390 Urinary tract infection, site not specified: Secondary | ICD-10-CM | POA: Diagnosis not present

## 2020-11-01 DIAGNOSIS — N3281 Overactive bladder: Secondary | ICD-10-CM | POA: Diagnosis not present

## 2020-11-01 MED ORDER — VIBEGRON 75 MG PO TABS: 75.0000 mg | ORAL_TABLET | Freq: Every day | ORAL | 11 refills | Status: AC

## 2020-11-01 MED ORDER — AMOXICILLIN-POT CLAVULANATE 875-125 MG PO TABS: 1.0000 | ORAL_TABLET | Freq: Two times a day (BID) | ORAL | 0 refills | Status: AC

## 2020-11-01 NOTE — Progress Notes (Signed)
   11/01/2020 11:29 AM   [name] 10-22-1950 [id number]  Reason for visit: Follow up foul-smelling urine, overactive bladder, recurrent UTIs  HPI: She is a very comorbid 70 year old female with a long history of urinary symptoms/overactive bladder and recurrent UTIs.  She has a complex history with multiple back surgeries and chronic pain and fibromyalgia.  Previously when I last saw her in August 2021 her urinary symptoms were well managed on Myrbetriq 50 mg daily, and she had not had any recurrent UTIs on trimethoprim prophylaxis.  She has recently been seen by Larene Beach in February 2022, and has had multiple ESBL E. coli cultures over the last 3 months, all treated with nitrofurantoin for 5 days each.    She continues to have very bothersome urinary symptoms of urgency, frequency, and urge incontinence requiring a depends.  She remains on the Myrbetriq.  I personally viewed and interpreted her renal ultrasound dated 09/20/2020 that shows no hydronephrosis or stones, and bladder is grossly normal appearing.  I think it is worth a trial of a antibiotic with better tissue penetration then nitrofurantoin, and I prescribed Augmentin twice daily for a 7-day course.  I also recommended a trial of Vibegron instead of Myrbetriq since this seems to not be helping as much.  Close follow-up in 1 to 2 months, return precautions discussed.   Billey Co, [facility] [address], [place], [place] [address] [phone]

## 2020-11-15 ENCOUNTER — Other Ambulatory Visit: Payer: Self-pay | Admitting: Family Medicine

## 2020-11-15 DIAGNOSIS — Z1231 Encounter for screening mammogram for malignant neoplasm of breast: Secondary | ICD-10-CM

## 2020-11-21 ENCOUNTER — Telehealth: Payer: Self-pay

## 2020-11-21 NOTE — Telephone Encounter (Signed)
PA initiated on cover my meds for [place]. Initial PA denied. Appeal started 11/21/2020.

## 2020-11-22 NOTE — Telephone Encounter (Signed)
Incoming form from [place] requesting signature from pt in order to file appeals on her behalf. Called pt informed her of the need to come by the office and sign form. Form placed up front with receptionist. Pt voiced understanding.

## 2020-11-28 NOTE — Telephone Encounter (Signed)
Incoming appeal denial from [place] for [place]. Denial letter will be faxed to [place] and mailed to the patient.

## 2021-01-03 ENCOUNTER — Ambulatory Visit: Payer: Self-pay | Admitting: Urology

## 2021-01-05 ENCOUNTER — Encounter: Payer: Self-pay | Admitting: Urology

## 2021-04-13 ENCOUNTER — Ambulatory Visit: Payer: Medicare Other | Admitting: Cardiology

## 2021-10-10 ENCOUNTER — Other Ambulatory Visit: Payer: Self-pay | Admitting: *Deleted

## 2021-10-10 DIAGNOSIS — D631 Anemia in chronic kidney disease: Secondary | ICD-10-CM

## 2021-10-17 ENCOUNTER — Inpatient Hospital Stay: Payer: Medicare Other | Attending: Oncology

## 2021-10-19 ENCOUNTER — Inpatient Hospital Stay: Payer: Medicare Other | Admitting: Oncology

## 2021-12-04 IMAGING — RF DG C-ARM 1-60 MIN
1 series · 5 of 5 positions shown · non-contrast
Comparison: None.

CLINICAL DATA: Surgery. Back pain.

EXAM:
DG C-ARM 1-60 MIN
None
FLUOROSCOPY TIME:  Fluoroscopy Time:  8 seconds
Number of Acquired Spot Images: 5

[Series 1: unknown protocol · 0.20mm/px · 5 of 5 slices shown]
[im 1/5]
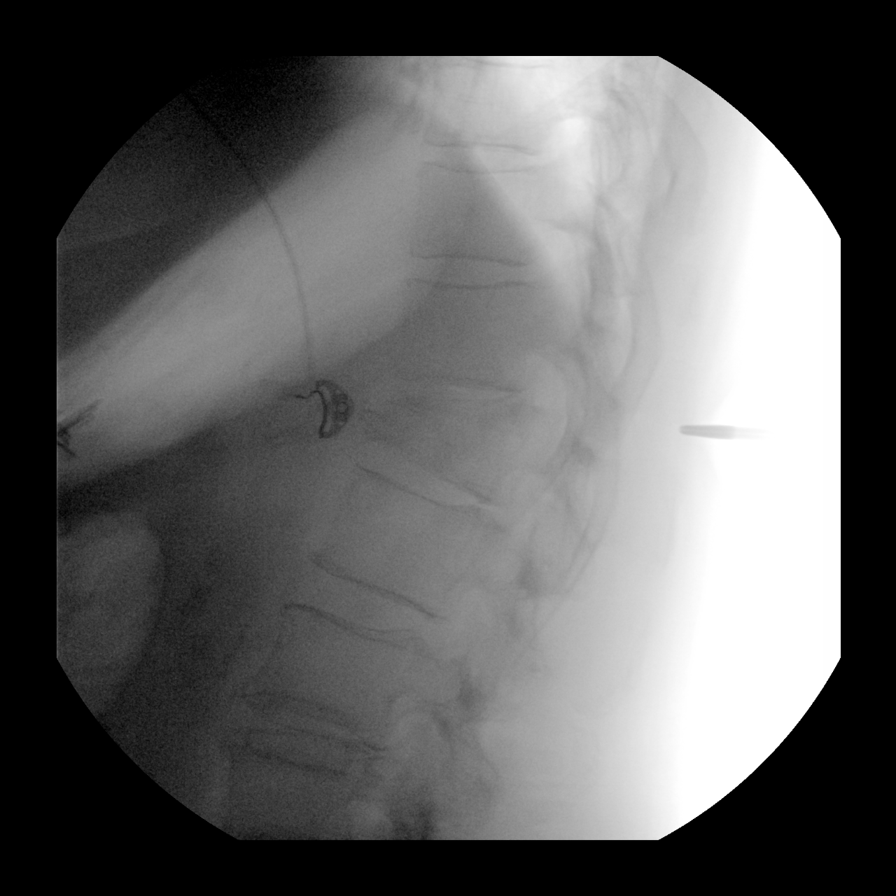
[im 2/5]
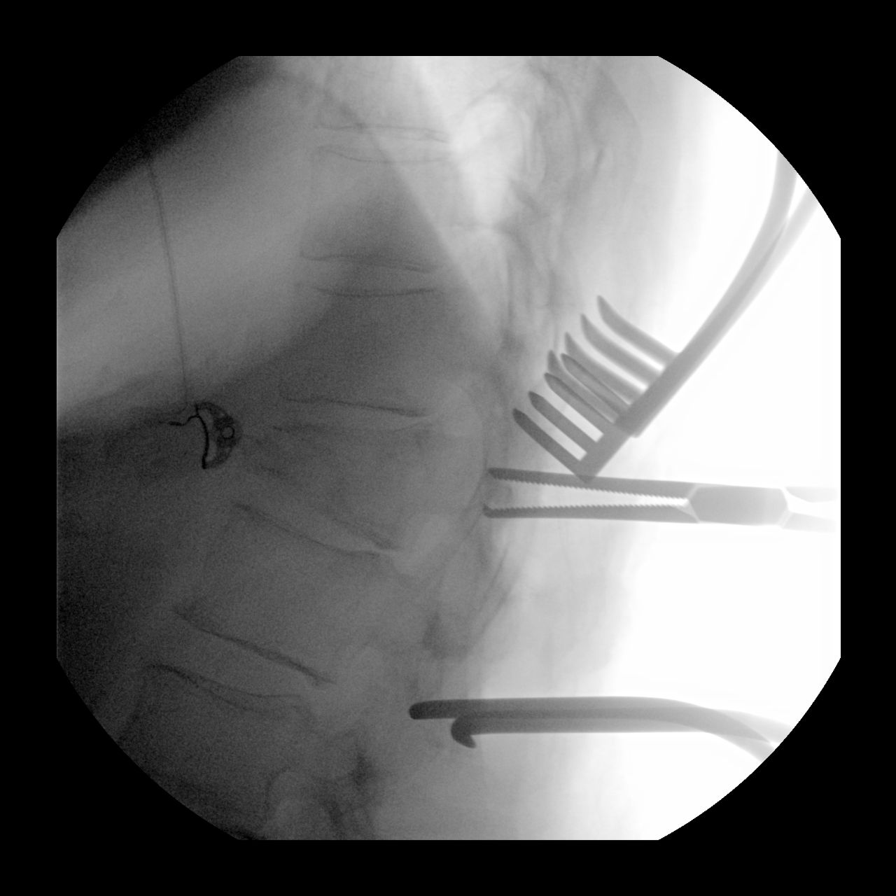
[im 3/5]
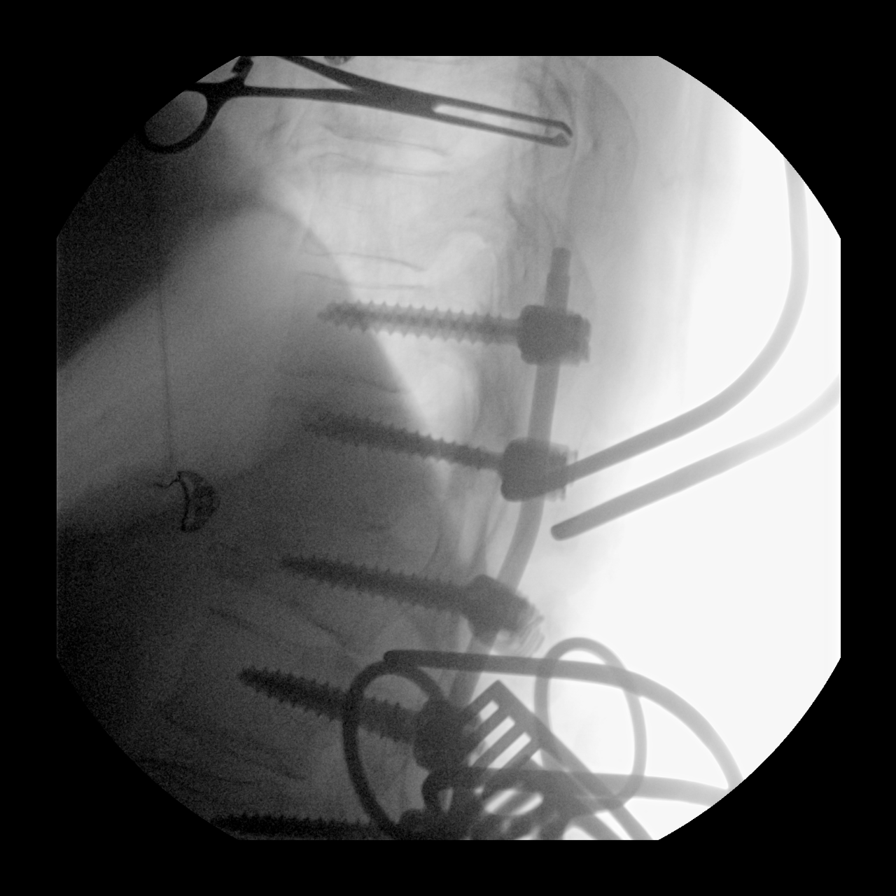
[im 4/5]
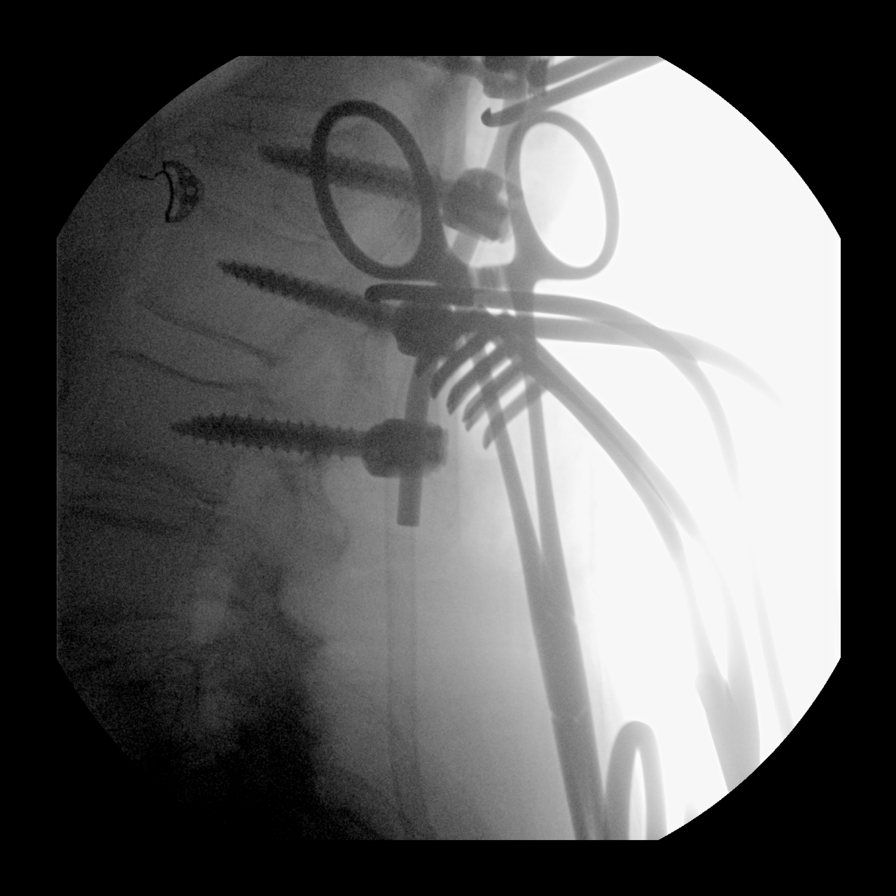
[im 5/5]
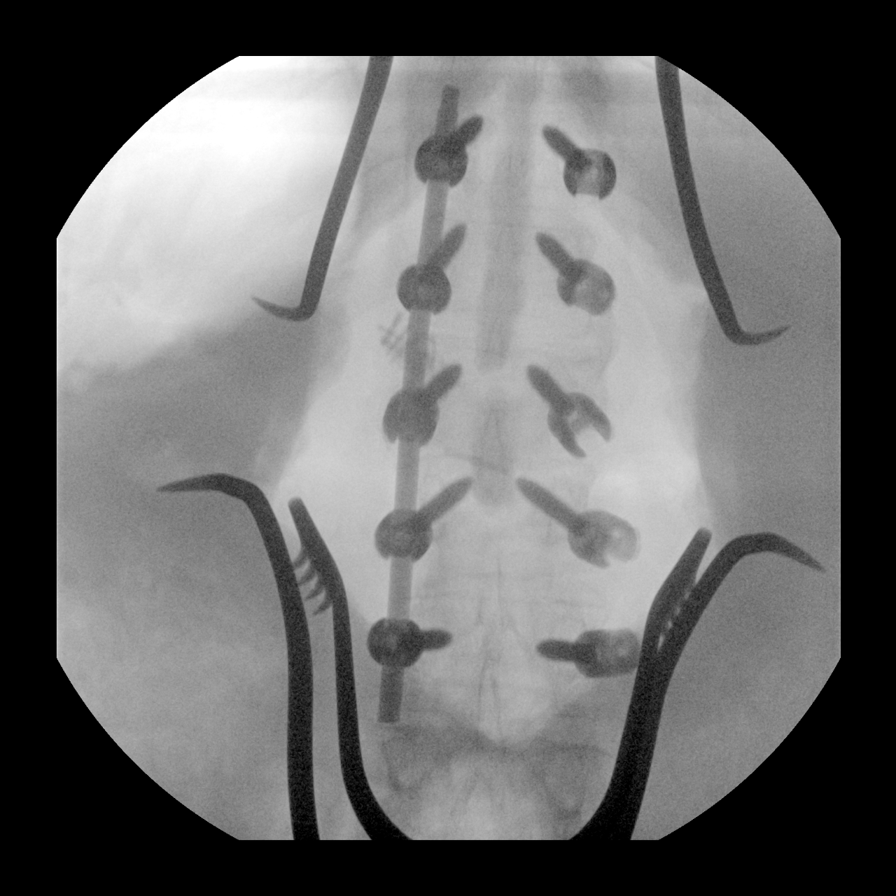

[5 of 5 positions shown; findings below may reference images not displayed]

FINDINGS: There is instrumentation at the L1 level on the initial image. Again
noted is an L1 compression fracture. Subsequent images show
posterior fusion hardware that appears to extend from the T11 level
to the L3 level. Expected postsurgical changes are noted.
IMPRESSION: Status post posterior fusion as above. See operative note for
further detail.

## 2022-07-03 IMAGING — RF DG MYELOGRAM LUMBAR
12 series · 12 of 12 positions shown · non-contrast
Comparison: None

CLINICAL DATA: Lumbar scratched it prior lumbar fusion. Spinal
stenosis.
TECHNIQUE: Contiguous axial images were obtained through the Lumbar spine after
the intrathecal infusion of infusion. Coronal and sagittal
reconstructions were obtained of the axial image sets.

[Series 1: fluoro_myelogram_singleshot_bw · 0.18mm/px · 1 of 1 slices shown (1 of 12)]
[im 1/1]
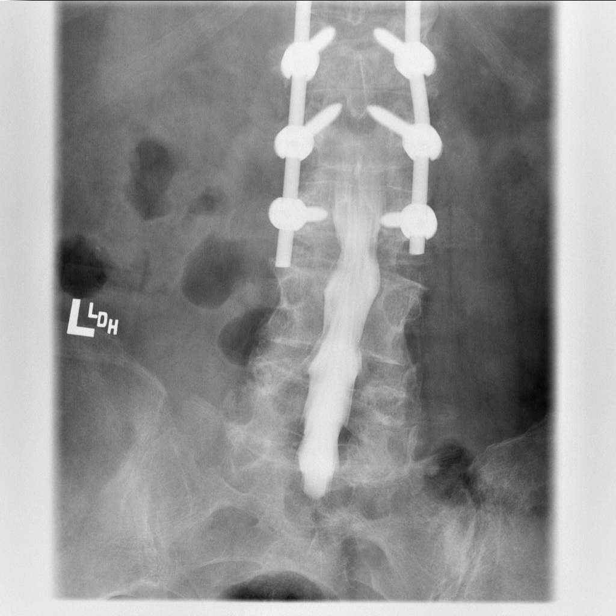

[Series 2: fluoro_myelogram_singleshot_bw · 0.18mm/px · 1 of 1 slices shown (2 of 12)]
[im 1/1]
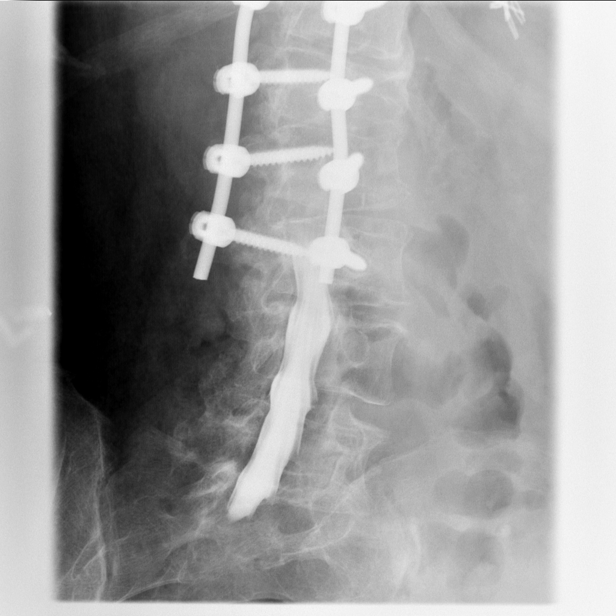

[Series 3: fluoro_myelogram_singleshot_bw · 0.18mm/px · 1 of 1 slices shown (3 of 12)]
[im 1/1]
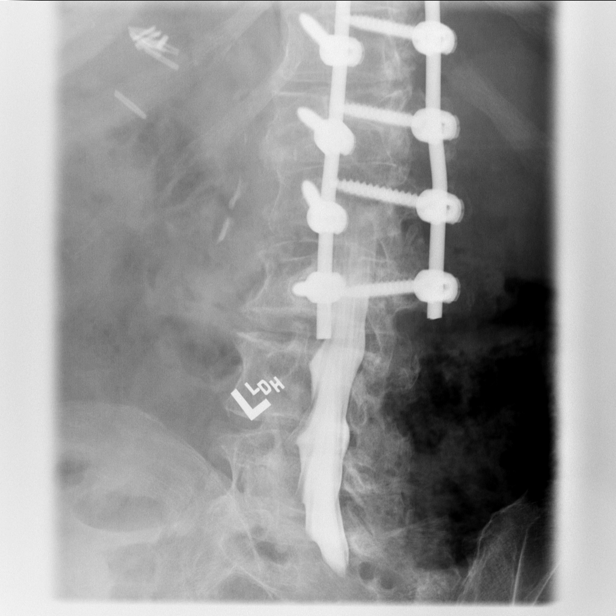

[Series 4: fluoro_myelogram_singleshot_bw · 0.18mm/px · 1 of 1 slices shown (4 of 12)]
[im 1/1]
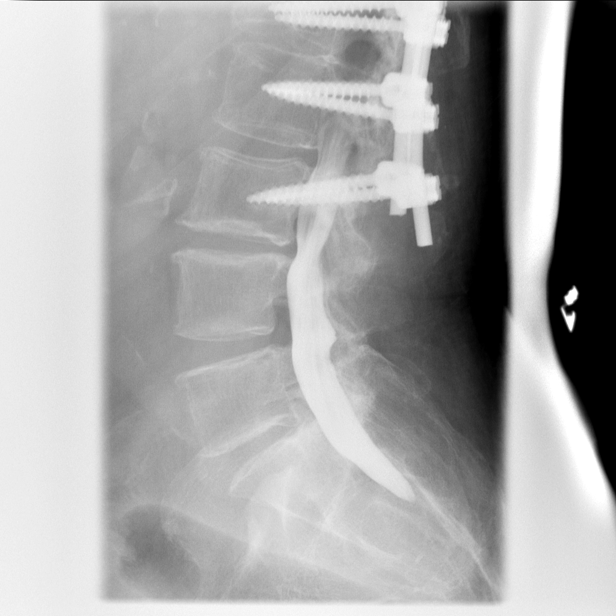

[Series 5: fluoro_myelogram_singleshot_bw · 0.18mm/px · 1 of 1 slices shown (5 of 12)]
[im 1/1]
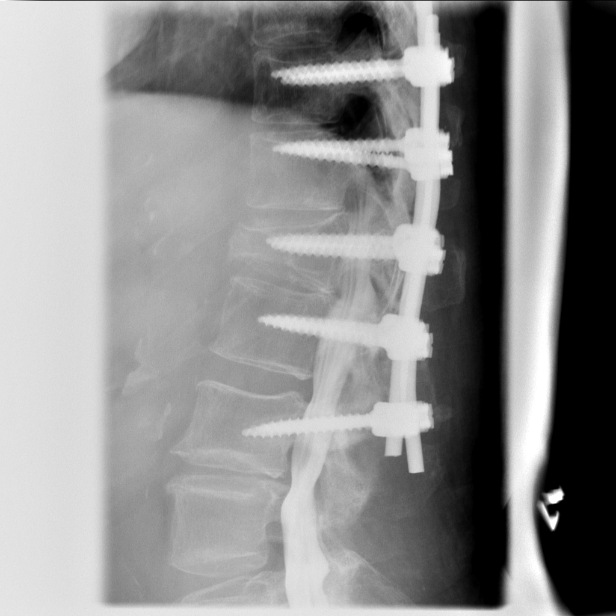

[Series 6: fluoro_myelogram_singleshot_bw · 0.18mm/px · 1 of 1 slices shown (6 of 12)]
[im 1/1]
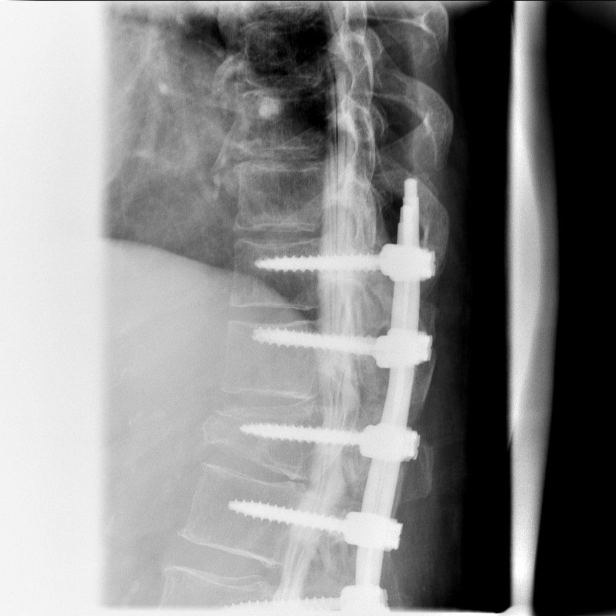

[Series 7: fluoro_myelogram_singleshot_bw · 0.18mm/px · 1 of 1 slices shown (7 of 12)]
[im 1/1]
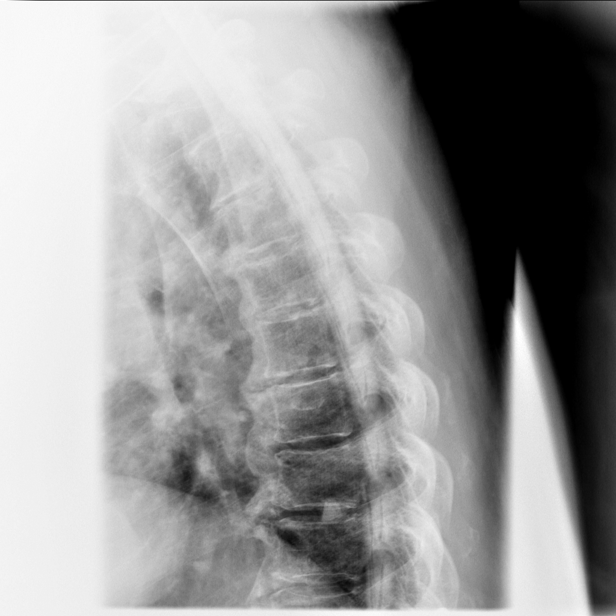

[Series 8: fluoro_myelogram_singleshot_bw · 0.18mm/px · 1 of 1 slices shown (8 of 12)]
[im 1/1]
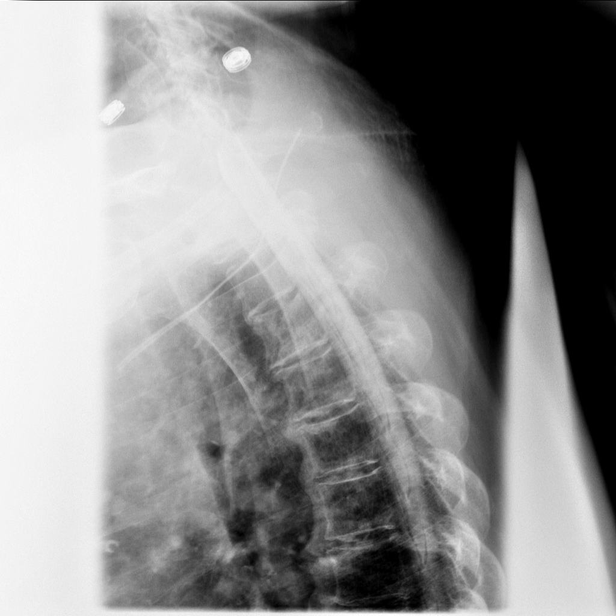

[Series 9: fluoro_myelogram_singleshot_bw · 0.19mm/px · 1 of 1 slices shown (9 of 12)]
[im 1/1]
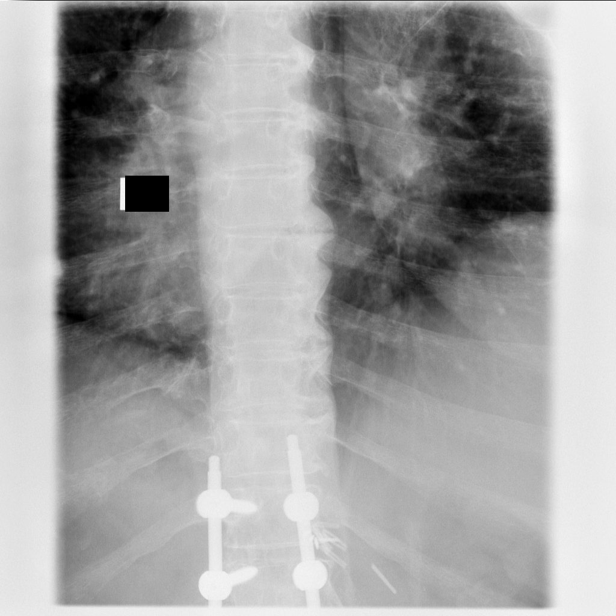

[Series 10: fluoro_myelogram_singleshot_bw · 0.20mm/px · 1 of 1 slices shown (10 of 12)]
[im 1/1]
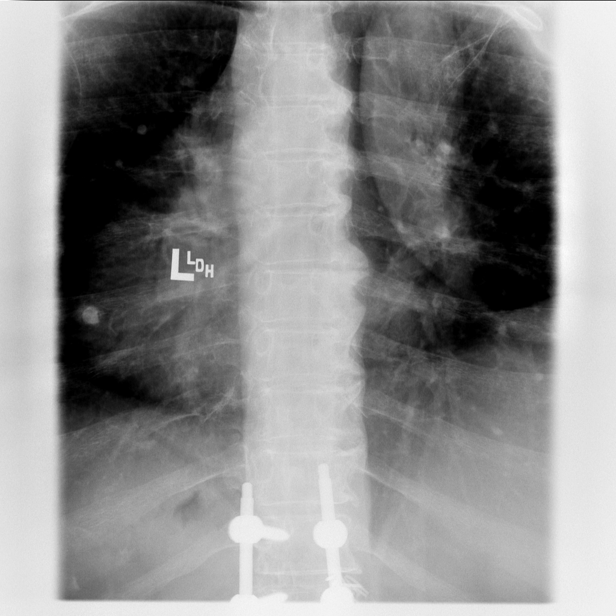

[Series 11: fluoro_myelogram_singleshot_bw · 0.20mm/px · 1 of 1 slices shown (11 of 12)]
[im 1/1]
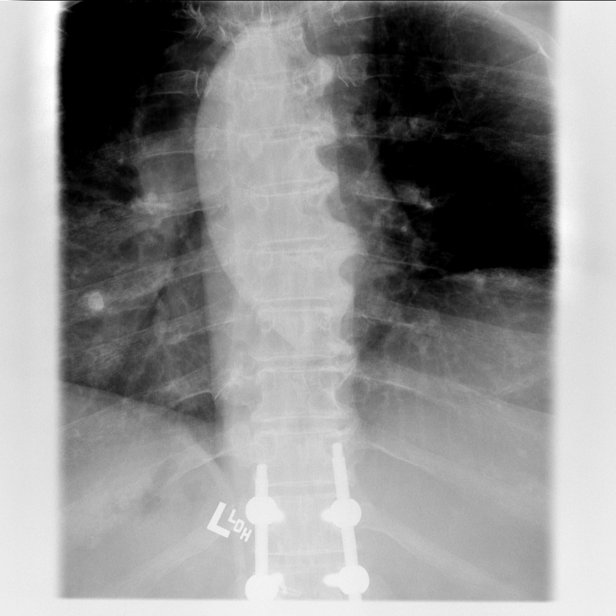

[Series 12: fluoro_myelogram_singleshot_bw · 0.20mm/px · 1 of 1 slices shown (12 of 12)]
[im 1/1]
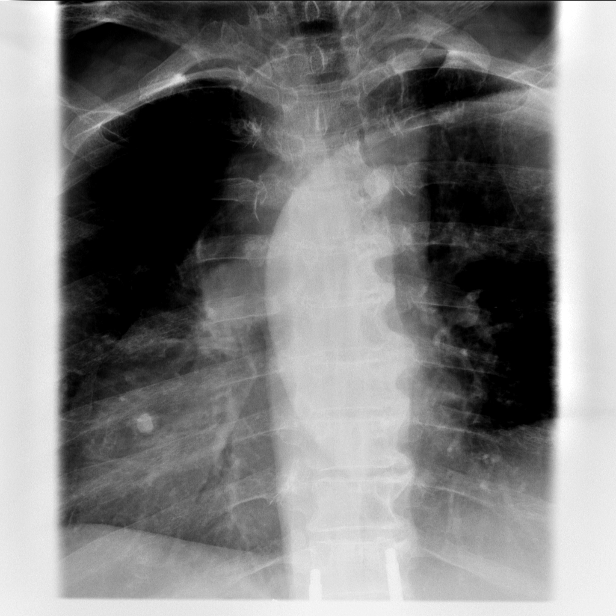

[12 of 12 positions shown; findings below may reference images not displayed]

EXAM:
LUMBAR MYELOGRAM

FLUOROSCOPY TIME:  5 minutes 36 seconds.

PROCEDURE:
After thorough discussion of risks and benefits of the procedure
including bleeding, infection, injury to nerves, blood vessels,
adjacent structures as well as headache and CSF leak, written and
oral informed consent was obtained. Consent was obtained by Dr.
Alexis Burgos Chic. Time out form was completed.

Patient was positioned prone on the fluoroscopy table. Local
anesthesia was provided with 1% lidocaine without epinephrine after
prepped and draped in the usual sterile fashion. Puncture was
performed at L4-L5 using a 3 1/2 inch 22-gauge spinal needle via
MIDLINE approach. Using a single pass through the dura, the needle
was placed within the thecal sac, with return of clear CSF. Ten mL
of Omnipaque 300 was injected into the thecal sac, with normal
opacification of the nerve roots and cauda equina consistent with
free flow within the subarachnoid space.

I personally performed the lumbar puncture and administered the
intrathecal contrast. I also personally supervised acquisition of
the myelogram images.
FINDINGS: LUMBAR MYELOGRAM FINDINGS:

Prior thoracolumbar spine fusion. Hardware intact. Mild loosening of
the lowest lumbar pedicle screws cannot be completely excluded.
Multilevel ventral epidural defects are noted consistent with disc
disease. Reference is made to CT report for further findings.

CT LUMBAR MYELOGRAM FINDINGS:

Reference is made to CT report for further findings.
IMPRESSION: LUMBAR MYELOGRAM IMPRESSION:

Successful lumbar myelogram as above. Reference is made to
postmyelogram CT report for further findings.

CT LUMBAR MYELOGRAM IMPRESSION:

Reference is made to postmyelogram CT report for further findings.
